# Patient Record
Sex: Female | Born: 1945 | Race: White | Hispanic: No | Smoking: Former smoker
Health system: Southern US, Community
[De-identification: ages and names within clinical notes are randomized; demographics above are authoritative.]

## PROBLEM LIST (undated history)

## (undated) DIAGNOSIS — M1990 Unspecified osteoarthritis, unspecified site: Secondary | ICD-10-CM

## (undated) DIAGNOSIS — R519 Headache, unspecified: Secondary | ICD-10-CM

## (undated) DIAGNOSIS — R51 Headache: Secondary | ICD-10-CM

## (undated) DIAGNOSIS — K5520 Angiodysplasia of colon without hemorrhage: Secondary | ICD-10-CM

## (undated) DIAGNOSIS — K529 Noninfective gastroenteritis and colitis, unspecified: Secondary | ICD-10-CM

## (undated) DIAGNOSIS — J45909 Unspecified asthma, uncomplicated: Secondary | ICD-10-CM

## (undated) DIAGNOSIS — F329 Major depressive disorder, single episode, unspecified: Secondary | ICD-10-CM

## (undated) DIAGNOSIS — Z86010 Personal history of colonic polyps: Secondary | ICD-10-CM

## (undated) DIAGNOSIS — N189 Chronic kidney disease, unspecified: Secondary | ICD-10-CM

## (undated) DIAGNOSIS — G43909 Migraine, unspecified, not intractable, without status migrainosus: Secondary | ICD-10-CM

## (undated) DIAGNOSIS — I10 Essential (primary) hypertension: Secondary | ICD-10-CM

## (undated) DIAGNOSIS — K219 Gastro-esophageal reflux disease without esophagitis: Secondary | ICD-10-CM

## (undated) DIAGNOSIS — E785 Hyperlipidemia, unspecified: Secondary | ICD-10-CM

## (undated) DIAGNOSIS — J439 Emphysema, unspecified: Secondary | ICD-10-CM

## (undated) DIAGNOSIS — Z9981 Dependence on supplemental oxygen: Secondary | ICD-10-CM

## (undated) DIAGNOSIS — Z8719 Personal history of other diseases of the digestive system: Secondary | ICD-10-CM

## (undated) DIAGNOSIS — F419 Anxiety disorder, unspecified: Secondary | ICD-10-CM

## (undated) DIAGNOSIS — R112 Nausea with vomiting, unspecified: Secondary | ICD-10-CM

## (undated) DIAGNOSIS — M3130 Wegener's granulomatosis without renal involvement: Secondary | ICD-10-CM

## (undated) DIAGNOSIS — N390 Urinary tract infection, site not specified: Secondary | ICD-10-CM

## (undated) DIAGNOSIS — Z9889 Other specified postprocedural states: Secondary | ICD-10-CM

## (undated) DIAGNOSIS — I2510 Atherosclerotic heart disease of native coronary artery without angina pectoris: Secondary | ICD-10-CM

## (undated) DIAGNOSIS — F32A Depression, unspecified: Secondary | ICD-10-CM

## (undated) DIAGNOSIS — M8580 Other specified disorders of bone density and structure, unspecified site: Secondary | ICD-10-CM

## (undated) HISTORY — DX: Angiodysplasia of colon without hemorrhage: K55.20

## (undated) HISTORY — DX: Essential (primary) hypertension: I10

## (undated) HISTORY — DX: Personal history of colonic polyps: Z86.010

## (undated) HISTORY — PX: APPENDECTOMY: SHX54

## (undated) HISTORY — DX: Unspecified asthma, uncomplicated: J45.909

## (undated) HISTORY — PX: DILATION AND CURETTAGE OF UTERUS: SHX78

## (undated) HISTORY — PX: ABDOMINAL HYSTERECTOMY: SHX81

## (undated) HISTORY — DX: Hereditary hemochromatosis: E83.110

## (undated) HISTORY — DX: Emphysema, unspecified: J43.9

## (undated) HISTORY — DX: Depression, unspecified: F32.A

## (undated) HISTORY — DX: Hyperlipidemia, unspecified: E78.5

## (undated) HISTORY — PX: CYSTECTOMY: SUR359

## (undated) HISTORY — DX: Major depressive disorder, single episode, unspecified: F32.9

## (undated) HISTORY — DX: Atherosclerotic heart disease of native coronary artery without angina pectoris: I25.10

## (undated) HISTORY — PX: ESOPHAGOGASTRODUODENOSCOPY: SHX1529

## (undated) HISTORY — DX: Urinary tract infection, site not specified: N39.0

## (undated) HISTORY — DX: Noninfective gastroenteritis and colitis, unspecified: K52.9

## (undated) HISTORY — PX: TENDON RELEASE: SHX230

## (undated) HISTORY — PX: COLONOSCOPY W/ BIOPSIES: SHX1374

## (undated) HISTORY — DX: Gastro-esophageal reflux disease without esophagitis: K21.9

## (undated) HISTORY — PX: LAPAROSCOPIC CHOLECYSTECTOMY: SUR755

---

## 1998-07-24 ENCOUNTER — Encounter: Payer: Self-pay | Admitting: Obstetrics and Gynecology

## 1998-07-30 ENCOUNTER — Inpatient Hospital Stay (HOSPITAL_COMMUNITY): Admission: RE | Admit: 1998-07-30 | Discharge: 1998-08-01 | Payer: Self-pay | Admitting: Obstetrics and Gynecology

## 1999-03-16 ENCOUNTER — Encounter: Payer: Self-pay | Admitting: Orthopedic Surgery

## 1999-03-16 ENCOUNTER — Encounter: Admission: RE | Admit: 1999-03-16 | Discharge: 1999-03-16 | Payer: Self-pay | Admitting: Orthopedic Surgery

## 1999-03-16 ENCOUNTER — Ambulatory Visit (HOSPITAL_BASED_OUTPATIENT_CLINIC_OR_DEPARTMENT_OTHER): Admission: RE | Admit: 1999-03-16 | Discharge: 1999-03-16 | Payer: Self-pay | Admitting: Orthopedic Surgery

## 1999-09-24 ENCOUNTER — Other Ambulatory Visit: Admission: RE | Admit: 1999-09-24 | Discharge: 1999-09-24 | Payer: Self-pay | Admitting: Obstetrics and Gynecology

## 2002-03-29 HISTORY — PX: BLADDER SUSPENSION: SHX72

## 2002-03-29 HISTORY — DX: Hereditary hemochromatosis: E83.110

## 2004-02-14 ENCOUNTER — Other Ambulatory Visit: Admission: RE | Admit: 2004-02-14 | Discharge: 2004-02-14 | Payer: Self-pay | Admitting: Obstetrics and Gynecology

## 2008-08-09 ENCOUNTER — Encounter: Admission: RE | Admit: 2008-08-09 | Discharge: 2008-08-09 | Payer: Self-pay | Admitting: Pediatrics

## 2008-08-09 ENCOUNTER — Encounter: Admission: RE | Admit: 2008-08-09 | Discharge: 2008-08-09 | Payer: Self-pay | Admitting: Family Medicine

## 2008-12-09 ENCOUNTER — Encounter: Payer: Self-pay | Admitting: Internal Medicine

## 2009-05-12 ENCOUNTER — Encounter: Payer: Self-pay | Admitting: Internal Medicine

## 2009-05-12 LAB — CONVERTED CEMR LAB
ALT: 29 units/L
Albumin: 4.4 g/dL
BUN: 10 mg/dL
CO2: 25 meq/L
Chloride: 101 meq/L
HCT: 39.3 %
HDL: 37 mg/dL
Hemoglobin: 12.9 g/dL
LDL Cholesterol: 153 mg/dL
MCV: 89.6 fL
Potassium: 3.3 meq/L
RBC: 4.39 M/uL
RDW: 16.9 %
Total Bilirubin: 0.4 mg/dL
Total Protein: 6.9 g/dL

## 2009-06-05 ENCOUNTER — Encounter: Payer: Self-pay | Admitting: Internal Medicine

## 2009-06-05 LAB — CONVERTED CEMR LAB
ALT: 22 units/L
AST: 17 units/L
Albumin: 4.3 g/dL
Alkaline Phosphatase: 85 units/L
BUN: 11 mg/dL
CO2: 24 meq/L
Calcium: 9.3 mg/dL
Chloride: 104 meq/L
Creatinine, Ser: 0.75 mg/dL
Glucose, Bld: 102 mg/dL
HCT: 40.2 %
Hemoglobin: 13.3 g/dL
MCV: 90.9 fL
Platelets: 269 10*3/uL
Potassium: 3.7 meq/L
RBC: 4.42 M/uL
RDW: 17.3 %
Sodium: 138 meq/L
Total Bilirubin: 0.3 mg/dL
Total Protein: 6.9 g/dL
WBC: 9.7 10*3/uL

## 2009-07-03 ENCOUNTER — Encounter: Payer: Self-pay | Admitting: Internal Medicine

## 2009-08-18 ENCOUNTER — Encounter: Admission: RE | Admit: 2009-08-18 | Discharge: 2009-08-18 | Payer: Self-pay | Admitting: Family Medicine

## 2009-11-11 ENCOUNTER — Encounter: Payer: Self-pay | Admitting: Internal Medicine

## 2009-11-11 LAB — CONVERTED CEMR LAB
AST: 18 units/L
Alkaline Phosphatase: 85 units/L
CO2: 25 meq/L
Chloride: 103 meq/L
Cholesterol: 201 mg/dL
HDL: 34 mg/dL
LDL Cholesterol: 103 mg/dL
Lymphocytes, automated: 19.3 %
Monocytes Relative: 7.7 %
Neutrophils Relative %: 72 %
RBC: 4.44 M/uL
RDW: 16.9 %
Sodium: 142 meq/L
Triglyceride fasting, serum: 318 mg/dL
WBC: 8.4 10*3/uL

## 2010-01-01 ENCOUNTER — Encounter: Payer: Self-pay | Admitting: Internal Medicine

## 2010-01-01 LAB — CONVERTED CEMR LAB
Calcium: 9.7 mg/dL
Creatinine, Ser: 0.81 mg/dL
Glucose, Bld: 101 mg/dL
Potassium: 3.3 meq/L
Sodium: 143 meq/L

## 2010-01-28 ENCOUNTER — Encounter: Payer: Self-pay | Admitting: Internal Medicine

## 2010-01-28 LAB — CONVERTED CEMR LAB
BUN: 14 mg/dL
Glucose, Bld: 90 mg/dL
Potassium: 3.6 meq/L
Sodium: 141 meq/L

## 2010-04-10 ENCOUNTER — Encounter: Payer: Self-pay | Admitting: Internal Medicine

## 2010-04-19 ENCOUNTER — Encounter: Payer: Self-pay | Admitting: Family Medicine

## 2010-05-15 ENCOUNTER — Ambulatory Visit (INDEPENDENT_AMBULATORY_CARE_PROVIDER_SITE_OTHER): Payer: Self-pay | Admitting: Internal Medicine

## 2010-05-15 ENCOUNTER — Encounter: Payer: Self-pay | Admitting: Internal Medicine

## 2010-05-15 ENCOUNTER — Other Ambulatory Visit: Payer: Self-pay

## 2010-05-15 ENCOUNTER — Other Ambulatory Visit: Payer: Self-pay | Admitting: Internal Medicine

## 2010-05-15 DIAGNOSIS — K219 Gastro-esophageal reflux disease without esophagitis: Secondary | ICD-10-CM

## 2010-05-15 DIAGNOSIS — J309 Allergic rhinitis, unspecified: Secondary | ICD-10-CM

## 2010-05-15 DIAGNOSIS — I10 Essential (primary) hypertension: Secondary | ICD-10-CM

## 2010-05-15 LAB — CONVERTED CEMR LAB
Lymphocytes Relative: 31 % (ref 12–46)
Lymphs Abs: 2.6 10*3/uL (ref 0.7–4.0)
Neutrophils Relative %: 58 % (ref 43–77)
Platelets: 307 10*3/uL (ref 150–400)
WBC: 8.2 10*3/uL (ref 4.0–10.5)

## 2010-05-15 LAB — IBC PANEL
Iron: 43 ug/dL (ref 42–145)
Saturation Ratios: 14.8 % — ABNORMAL LOW (ref 20.0–50.0)
Transferrin: 206.9 mg/dL — ABNORMAL LOW (ref 212.0–360.0)

## 2010-05-15 LAB — POTASSIUM: Potassium: 3.5 mEq/L (ref 3.5–5.1)

## 2010-05-15 LAB — FERRITIN: Ferritin: 11.3 ng/mL (ref 10.0–291.0)

## 2010-05-15 LAB — HEPATIC FUNCTION PANEL: Albumin: 3.9 g/dL (ref 3.5–5.2)

## 2010-05-15 LAB — CREATININE, SERUM: Creatinine, Ser: 0.7 mg/dL (ref 0.4–1.2)

## 2010-05-17 DIAGNOSIS — J309 Allergic rhinitis, unspecified: Secondary | ICD-10-CM | POA: Insufficient documentation

## 2010-05-21 ENCOUNTER — Encounter: Payer: Self-pay | Admitting: Internal Medicine

## 2010-05-21 DIAGNOSIS — J45909 Unspecified asthma, uncomplicated: Secondary | ICD-10-CM | POA: Insufficient documentation

## 2010-05-26 NOTE — Assessment & Plan Note (Signed)
Summary: NEW SELF PAY $184/#/STC   Vital Signs:  Patient profile:   65 year old female Menstrual status:  hysterectomy Height:      65 inches (165.10 cm) Weight:      153 pounds (69.55 kg) BMI:     25.55 O2 Sat:      96 % on Room air Temp:     97.9 degrees F (36.61 degrees C) oral Pulse rate:   97 / minute BP sitting:   124 / 84  (left arm) Cuff size:   large  Vitals Entered By: Brenton Grills CMA Duncan Dull) (May 15, 2010 2:05 PM)  O2 Flow:  Room air CC: New Pt to establish care/ja Is Patient Diabetic? No     Menstrual Status hysterectomy   Primary Care Provider:  Newt Lukes, MD  CC:  New Pt to establish care/ja.  History of Present Illness: new pt to me and our practice, here to est care  hemachromatosis - controls same with vol blood donation every 3 months - no hx liver problems or abn labs per pt (will send for records)  HTN - reports compliance with ongoing medical treatment and no changes in medication dose or frequency. denies adverse side effects related to current therapy.   GERD - reports compliance with ongoing medical treatment and no changes in medication dose or frequency. denies adverse side effects related to current therapy.   allg rhinitis and seasonal sinusitus - controls same with otc meds and neti pot use - no current sinus pressure, fever or purulent discharge, no HA or fever  Preventive Screening-Counseling & Management  Alcohol-Tobacco     Alcohol drinks/day: 0     Smoking Status: current     Smoking Cessation Counseling: yes     Tobacco Counseling: to quit use of tobacco products  Caffeine-Diet-Exercise     Does Patient Exercise: no     Exercise Counseling: to improve exercise regimen     Depression Counseling: not indicated; screening negative for depression  Safety-Violence-Falls     Seat Belt Counseling: not indicated; patient wears seat belts     Helmet Counseling: not applicable     Firearm Counseling: not indicated; uses  recommended firearm safety measures     Violence Counseling: not indicated; no violence risk noted  Clinical Review Panels:  Prevention   Last Mammogram:  normal (03/29/2009)  Immunizations   Last Pneumovax:  Historical (03/29/2009)   Current Medications (verified): 1)  Aspirin 81 Mg Tbec (Aspirin) .Marland Kitchen.. 1 Tablet By Mouth Once Daily 2)  Fish Oil 1000 Mg Caps (Omega-3 Fatty Acids) .Marland Kitchen.. 1 Capsule By Mouth Two Times A Day 3)  Flax Seed Oil 1000 Mg Caps (Flaxseed (Linseed)) .Marland Kitchen.. 1 Capsule By Mouth Two Times A Day 4)  Ranitidine Hcl 150 Mg Caps (Ranitidine Hcl) .Marland Kitchen.. 1 By Mouth Once Daily 5)  Diazepam 5 Mg Tabs (Diazepam) .... 1/2-1 Tablet By Mouth Every 12 Hours As Needed 6)  Hydrochlorothiazide 25 Mg Tabs (Hydrochlorothiazide) .... 1/2 Tablet By Mouth Once Daily 7)  Potassium Chloride 20 Meq/36ml (10%) Liqd (Potassium Chloride) .Marland KitchenMarland KitchenMarland Kitchen 15ml Once Daily  Allergies (verified): 1)  ! Cipro (Ciprofloxacin Hcl)  Past History:  Past Medical History: hemachromatosis - dx 2004 hypertenion essential tremor GERD with HH allg rhinitis  MD roster: gyn - alan ross  Past Surgical History: Cholecystectomy-2011 Appendectomy-1983 Hysterectomy-1983 bladder tack -  2004  Family History: Family History of Alcoholism/Addiction Family History of Arthritis (Parents) Family History Breast cancer 1st degree relative <50 Family  History Hypertension Family History Diabetes 1st degree relative Family History of Heart Disease (Parents)  Social History: Retired - worked in [employer] Married, lives with spouse who has met prostate ca smokes - no alcohol Smoking Status:  current Does Patient Exercise:  no  Review of Systems       see HPI above. I have reviewed all other systems and they were negative.   Physical Exam  General:  alert, well-developed, well-nourished, and cooperative to examination.    Head:  Normocephalic and atraumatic without obvious abnormalities. No apparent alopecia or  balding. Eyes:  vision grossly intact; pupils equal, round and reactive to light.  conjunctiva and lids normal.    Ears:  R ear normal and L ear normal.   Mouth:  teeth and gums in good repair; mucous membranes moist, without lesions or ulcers. oropharynx clear without exudate, no erythema.  Neck:  supple, full ROM, no masses, no thyromegaly; no thyroid nodules or tenderness. no JVD or carotid bruits.   Lungs:  normal respiratory effort, no intercostal retractions or use of accessory muscles; normal breath sounds bilaterally - no crackles and no wheezes.    Heart:  normal rate, regular rhythm, no murmur, and no rub. BLE without edema. Abdomen:  soft, non-tender, normal bowel sounds, no distention; no masses and no appreciable hepatomegaly or splenomegaly.   Genitalia:  defer  Msk:  No deformity or scoliosis noted of thoracic or lumbar spine.   Neurologic:  benign head/neck tremor, alert & oriented X3 and cranial nerves II-XII symetrically intact.  strength normal in all extremities, sensation intact to light touch, and gait normal. speech fluent without dysarthria or aphasia; follows commands with good comprehension.  Skin:  no rashes, vesicles, ulcers, or erythema. No nodules or irregularity to palpation.  Psych:  Oriented X3, memory intact for recent and remote, normally interactive, good eye contact, not anxious appearing, not depressed appearing, and not agitated.      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 Laboratory test [phone])  Problem # 2:  HYPERTENSION (ICD-401.9)  Her updated medication list for this problem includes:    Hydrochlorothiazide 25 Mg Tabs  (Hydrochlorothiazide) .Marland Kitchen... 1/2 tablet by mouth once daily  Orders: TLB-Creatinine, Blood (82565-CREA) TLB-Potassium (K+) (84132-K)  BP today: 124/84  Problem # 3:  GERD (ICD-530.81)  Her updated medication list for this problem includes:    Ranitidine Hcl 150 Mg Caps (Ranitidine hcl) .Marland Kitchen... 1 by mouth once daily  Problem # 4:  ALLERGIC RHINITIS (ICD-477.9)  Discussed use of allergy medications and environmental measures.   Complete Medication List: 1)  Aspirin 81 Mg Tbec (Aspirin) .Marland Kitchen.. 1 tablet by mouth once daily 2)  Fish Oil 1000 Mg Caps (Omega-3 fatty acids) .Marland Kitchen.. 1 capsule by mouth two times a day 3)  Flax Seed Oil 1000 Mg Caps (Flaxseed (linseed)) .Marland Kitchen.. 1 capsule by mouth two times a day 4)  Ranitidine Hcl 150 Mg Caps (Ranitidine hcl) .Marland Kitchen.. 1 by mouth once daily 5)  Diazepam 5 Mg Tabs (Diazepam) .... 1/2-1 tablet by mouth every 12 hours as needed 6)  Hydrochlorothiazide 25 Mg Tabs (Hydrochlorothiazide) .... 1/2 tablet by mouth once daily 7)  Klor-con M10 10 Meq Cr-tabs (Potassium chloride crys cr) .Marland Kitchen.. 1 by mouth  once daily 8)  Methocarbamol 500 Mg Tabs (Methocarbamol) .Marland Kitchen.. 1 by mouth every 8 hours as needed for muscle relaxant  Patient Instructions: 1)  it was good to see you today. 2)  test(s) ordered today - your results will be posted on the phone tree for review in 48-72 hours from the time of test completion; call [phone] and enter your 9 digit MRN (listed above on this page, just below your name); if any changes need to be made or there are abnormal results, you will be contacted directly.  3)  change potassium to pills instead of liquid and use generic robaxin for muscle relaxant as needed - your prescriptions have been electronically submitted to your pharmacy. Please take as directed. Contact our office if you believe you're having problems with the medication(s).  4)  will send to your prior doctors for copy of medical records - release of info signed today 5)   Please schedule a follow-up appointment in 6 months to check cholesterol, blood pressure and review medications, call sooner if problems.  6)  Tobacco is very bad for your health and your loved ones! You Should stop smoking!. Prescriptions: METHOCARBAMOL 500 MG TABS (METHOCARBAMOL) 1 by mouth every 8 hours as needed for muscle relaxant  #40 x 1   Entered and Authorized by:   Newt Lukes MD   Signed by:   Newt Lukes MD on 05/15/2010   Method used:   Electronically to        [facility] [address] (retail)       [address], [address]       [place], [place]  [address]       Ph: [phone]       Fax: [phone]   RxID:   [id number] KLOR-CON M10 10 MEQ CR-TABS (POTASSIUM CHLORIDE CRYS CR) 1 by mouth once daily  #30 x 6   Entered and Authorized by:   Newt Lukes MD   Signed by:   Newt Lukes MD on 05/15/2010   Method used:   Electronically to        [facility] [address] (retail)       [address], [address]       [place], [place]  [address]       Ph: [phone]       Fax: [phone]   RxID:   [id number]    Orders Added: 1)  TLB-Creatinine, Blood [82565-CREA] 2)  TLB-Potassium (K+) [84132-K] 3)  TLB-Ferritin [82728-FER] 4)  TLB-Hepatic/Liver Function Pnl [80076-HEPATIC] 5)  TLB-IBC Pnl (Iron/FE;Transferrin) [83550-IBC] 6)  T- * Misc. Laboratory test [99999] 7)  New Patient Level III [99203]   Immunization History:  Pneumovax Immunization History:    Pneumovax:  historical (03/29/2009)   Immunization History:  Pneumovax Immunization History:    Pneumovax:  Historical (03/29/2009)    Preventive Care Screening  Mammogram:    Date:  03/29/2009    Results:  normal

## 2010-06-09 NOTE — Letter (Signed)
Summary: Medical Hx/[facility]  Medical Hx/[facility]   Imported By: Sherian Rein 06/03/2010 11:07:02  _____________________________________________________________________  External Attachment:    Type:   Image     Comment:   External Document

## 2010-06-09 NOTE — Letter (Signed)
Summary: Lars Mage DO  Lars Mage DO   Imported By: Sherian Rein 06/03/2010 11:05:46  _____________________________________________________________________  External Attachment:    Type:   Image     Comment:   External Document

## 2010-06-09 NOTE — Letter (Signed)
Summary: H&P/[facility]  H&P/[facility]   Imported By: Sherian Rein 06/03/2010 11:02:37  _____________________________________________________________________  External Attachment:    Type:   Image     Comment:   External Document

## 2010-11-05 ENCOUNTER — Encounter: Payer: Self-pay | Admitting: Internal Medicine

## 2010-11-13 ENCOUNTER — Encounter: Payer: Self-pay | Admitting: Internal Medicine

## 2010-11-13 ENCOUNTER — Ambulatory Visit (INDEPENDENT_AMBULATORY_CARE_PROVIDER_SITE_OTHER): Payer: Medicare Other | Admitting: Internal Medicine

## 2010-11-13 DIAGNOSIS — H6692 Otitis media, unspecified, left ear: Secondary | ICD-10-CM

## 2010-11-13 DIAGNOSIS — F4321 Adjustment disorder with depressed mood: Secondary | ICD-10-CM

## 2010-11-13 DIAGNOSIS — I10 Essential (primary) hypertension: Secondary | ICD-10-CM

## 2010-11-13 DIAGNOSIS — J309 Allergic rhinitis, unspecified: Secondary | ICD-10-CM

## 2010-11-13 DIAGNOSIS — H6690 Otitis media, unspecified, unspecified ear: Secondary | ICD-10-CM

## 2010-11-13 MED ORDER — METHOCARBAMOL 500 MG PO TABS: 500.0000 mg | ORAL_TABLET | Freq: Three times a day (TID) | ORAL | Status: DC | PRN

## 2010-11-13 MED ORDER — POTASSIUM CHLORIDE 10 MEQ PO TBCR: 10.0000 meq | EXTENDED_RELEASE_TABLET | Freq: Every day | ORAL | Status: DC

## 2010-11-13 MED ORDER — FLUTICASONE PROPIONATE 50 MCG/ACT NA SUSP: 2.0000 | Freq: Every day | NASAL | Status: DC

## 2010-11-13 MED ORDER — HYDROCHLOROTHIAZIDE 12.5 MG PO TABS: 12.5000 mg | ORAL_TABLET | Freq: Every day | ORAL | Status: DC

## 2010-11-13 MED ORDER — AMOXICILLIN-POT CLAVULANATE 875-125 MG PO TABS: 1.0000 | ORAL_TABLET | Freq: Two times a day (BID) | ORAL | Status: DC

## 2010-11-13 NOTE — Progress Notes (Signed)
  Subjective:    Patient ID: [name] [name], female    DOB: [birth year], 65 y.o.   MRN: [id number]  HPI complains of grief : loss of spouse since last OV - tearful spells, feels so alone -  Family/dtr nearby - pt denies si/hi or need for medications  also reviewed chronic medical illness  hemachromatosis - controls same with voluntary blood donation every 3 months - no hx liver problems or abn labs   hypertension - reports compliance with ongoing medical treatment and no changes in medication dose or frequency. denies adverse side effects related to current therapy.   GERD - reports compliance with ongoing medical treatment and no changes in medication dose or frequency. denies adverse side effects related to current therapy.   allergic rhinitis - associated with sinusitus - usually controls same with otc meds and neti pot use - recent increase in maxillary sinus pressure and left ear pressure consistent with prior infection symptoms - no fever or purulent discharge, no HA or fever   Past Medical History  Diagnosis Date  . Hereditary hemochromatosis   . ASTHMA   . ALLERGIC RHINITIS   . HYPERTENSION   . GERD     Review of Systems  Constitutional: Positive for fatigue.  HENT: Positive for postnasal drip. Negative for tinnitus.   Respiratory: Negative for cough.   Genitourinary: Negative for dysuria.  Neurological: Negative for numbness and headaches.  Psychiatric/Behavioral: Positive for dysphoric mood. Negative for behavioral problems and confusion.       Objective:   Physical Exam BP 118/76  Pulse 86  Temp(Src) 98.4 F (36.9 C) (Oral)  Ht 5\' 5"  (1.651 m)  Wt 138 lb 9.6 oz (62.869 kg)  BMI 23.06 kg/m2  SpO2 97% Constitutional: She is well-developed and well-nourished. No distress. (but emotional)  HENT: Head: Normocephalic and atraumatic, mildly tender over frontal sinus bilaterally. Ears: L TM with serous effusion, mild erythema; R TM ok, no erythema or effusion; Nose:  Nose normal.  Mouth/Throat: Oropharynx is clear and moist. No oropharyngeal exudate.  Eyes: Conjunctivae and EOM are normal. Pupils are equal, round, and reactive to light. No scleral icterus.  Neck: Normal range of motion. Neck supple. No JVD or LAD present. No thyromegaly present.  Cardiovascular: Normal rate, regular rhythm and normal heart sounds.  No murmur heard. No BLE edema. Pulmonary/Chest: Effort normal and breath sounds normal. No respiratory distress. She has no wheezes.  Psychiatric: She has an appropriately depressed mood and affect, tearful. Her behavior is normal. Judgment and thought content normal.   Lab Results  Component Value Date   WBC 8.2 05/15/2010   HGB 12.5 05/15/2010   HCT 37.3 05/15/2010   PLT 307 05/15/2010   CHOL 201 11/11/2009   HDL 34 11/11/2009   ALT 17 07/05/8117   AST 16 05/15/2010   NA 141 01/28/2010   K 3.5 05/15/2010   CL 106 01/28/2010   CREATININE 0.7 05/15/2010   BUN 14 01/28/2010   CO2 23 01/28/2010   TSH 2.54 12/09/2008   Lab Results  Component Value Date   IRON 43 05/15/2010   FERRITIN 11.3 05/15/2010      Assessment & Plan:  L OM with sinusitus - Augmentin and Flonase - erx done  Grief reaction - support offered over loss of spouse - no need for med intervention at this time and pt declines refer for counseling, will call if symptoms unimproved or worse  Also See problem list. Medications and labs reviewed today.

## 2010-11-13 NOTE — Patient Instructions (Addendum)
It was good to see you today. Augmentin antibiotics and nose spray as discussed - Your prescription(s) have been submitted to your pharmacy. Please take as directed and contact our office if you believe you are having problem(s) with the medication(s). Refill on medication(s) as discussed today. Please schedule followup in 4-6 months for blood pressure and cholesterol check, call sooner if problems. Hang there! Call if you need anything to help you get through this time

## 2010-11-13 NOTE — Assessment & Plan Note (Signed)
Controlled with low dose diuretic - The current medical regimen is effective;  continue present plan and medications.  Pt declines labs today - plan lipids, Cr/lytes next ov BP Readings from Last 3 Encounters:  11/13/10 118/76  05/15/10 124/84

## 2011-01-18 ENCOUNTER — Encounter: Payer: Self-pay | Admitting: Internal Medicine

## 2011-01-18 ENCOUNTER — Other Ambulatory Visit (INDEPENDENT_AMBULATORY_CARE_PROVIDER_SITE_OTHER): Payer: Medicare Other

## 2011-01-18 ENCOUNTER — Ambulatory Visit (INDEPENDENT_AMBULATORY_CARE_PROVIDER_SITE_OTHER): Payer: Medicare Other | Admitting: Internal Medicine

## 2011-01-18 DIAGNOSIS — I10 Essential (primary) hypertension: Secondary | ICD-10-CM

## 2011-01-18 DIAGNOSIS — Z23 Encounter for immunization: Secondary | ICD-10-CM

## 2011-01-18 DIAGNOSIS — F4321 Adjustment disorder with depressed mood: Secondary | ICD-10-CM

## 2011-01-18 LAB — BASIC METABOLIC PANEL
BUN: 12 mg/dL (ref 6–23)
Calcium: 9.1 mg/dL (ref 8.4–10.5)
GFR: 81.14 mL/min (ref >60.00)
Glucose, Bld: 99 mg/dL (ref 70–99)
Sodium: 142 mEq/L (ref 135–145)

## 2011-01-18 LAB — CBC WITH DIFFERENTIAL/PLATELET
Basophils Relative: 0.2 % (ref 0.0–3.0)
Eosinophils Relative: 0.9 % (ref 0.0–5.0)
HCT: 42.3 % (ref 36.0–46.0)
Hemoglobin: 14.6 g/dL (ref 12.0–15.0)
MCV: 96.3 fl (ref 78.0–100.0)
Monocytes Absolute: 0.8 10*3/uL (ref 0.1–1.0)
Neutro Abs: 7.4 10*3/uL (ref 1.4–7.7)
Neutrophils Relative %: 75.5 % (ref 43.0–77.0)
RBC: 4.39 Mil/uL (ref 3.87–5.11)
WBC: 9.8 10*3/uL (ref 4.5–10.5)

## 2011-01-18 LAB — HEPATIC FUNCTION PANEL
ALT: 14 U/L (ref 0–35)
Albumin: 4.1 g/dL (ref 3.5–5.2)
Alkaline Phosphatase: 81 U/L (ref 39–117)
Bilirubin, Direct: 0 mg/dL (ref 0.0–0.3)
Total Protein: 7 g/dL (ref 6.0–8.3)

## 2011-01-18 MED ORDER — NYSTATIN 100000 UNIT/ML MT SUSP: 500000.0000 [IU] | Freq: Four times a day (QID) | OROMUCOSAL | Status: AC

## 2011-01-18 MED ORDER — DIAZEPAM 5 MG PO TABS: 5.0000 mg | ORAL_TABLET | Freq: Two times a day (BID) | ORAL | Status: DC | PRN

## 2011-01-18 NOTE — Patient Instructions (Signed)
It was good to see you today. Test(s) ordered today. Your results will be called to you after review (48-72hours after test completion). If any changes need to be made, you will be notified at that time. Refill on medication(s) as discussed today. And use mouthwash as needed - Your prescription(s) have been submitted to your pharmacy. Please take as directed and contact our office if you believe you are having problem(s) with the medication(s). Please schedule followup in 4-6 months for blood pressure and iron check, call sooner if problems.

## 2011-01-18 NOTE — Progress Notes (Signed)
  Subjective:    Patient ID: [name] [name], female    DOB: 03-Oct-1945, 65 y.o.   MRN: [id number]  HPI  Here for follow up - reviewed chronic medical illness  hemachromatosis - controls same with voluntary blood donation every 3 months - no hx liver problems or abnormal labs   hypertension - reports compliance with ongoing medical treatment and no changes in medication dose or frequency. denies adverse side effects related to current therapy.   GERD - reports compliance with ongoing medical treatment and no changes in medication dose or frequency. denies adverse side effects related to current therapy.   allergic rhinitis - associated with sinusitus - usually controls same with otc meds and neti pot use - recent increase in maxillary sinus pressure and left ear pressure consistent with prior infection symptoms - no fever or purulent discharge, no HA or fever   Past Medical History  Diagnosis Date  . Hereditary hemochromatosis   . ASTHMA   . ALLERGIC RHINITIS   . HYPERTENSION   . GERD     Review of Systems  Constitutional: Positive for fatigue.  HENT: Positive for postnasal drip. Negative for tinnitus.   Respiratory: Negative for cough.   Genitourinary: Negative for dysuria.  Neurological: Negative for numbness and headaches.  Psychiatric/Behavioral: Positive for dysphoric mood. Negative for behavioral problems and confusion.       Objective:   Physical Exam  BP 120/86  Pulse 98  Temp(Src) 97.6 F (36.4 C) (Oral)  Ht 5\' 4"  (1.626 m)  Wt 137 lb 6.4 oz (62.324 kg)  BMI 23.58 kg/m2  SpO2 97% Wt Readings from Last 3 Encounters:  01/18/11 137 lb 6.4 oz (62.324 kg)  11/13/10 138 lb 9.6 oz (62.869 kg)  05/15/10 153 lb (69.4 kg)    Constitutional: She is well-developed and well-nourished. No distress. Mild resting head/neck tremor Mouth/Throat: Oropharynx is clear and moist. No oropharyngeal exudate.  Eyes: Conjunctivae and EOM are normal. Pupils are equal, round, and  reactive to light. No scleral icterus.  Neck: Normal range of motion. Neck supple. No JVD or LAD present. No thyromegaly present.  Cardiovascular: Normal rate, regular rhythm and normal heart sounds.  No murmur heard. No BLE edema. Pulmonary/Chest: Effort normal and breath sounds normal. No respiratory distress. She has no wheezes.  Psychiatric: She has an appropriately depressed mood and affect, tearful. Her behavior is normal. Judgment and thought content normal.   Lab Results  Component Value Date   WBC 8.2 05/15/2010   HGB 12.5 05/15/2010   HCT 37.3 05/15/2010   PLT 307 05/15/2010   CHOL 201 11/11/2009   HDL 34 11/11/2009   ALT 17 11/25/5619   AST 16 05/15/2010   NA 141 01/28/2010   K 3.5 05/15/2010   CL 106 01/28/2010   CREATININE 0.7 05/15/2010   BUN 14 01/28/2010   CO2 23 01/28/2010   TSH 2.54 12/09/2008   Lab Results  Component Value Date   IRON 43 05/15/2010   FERRITIN 11.3 05/15/2010      Assessment & Plan:  See problem list. Medications and labs reviewed today.

## 2011-01-18 NOTE — Assessment & Plan Note (Signed)
Precipitated by spouses death summer 08/2010 - tolerating well considering Support offered - refill on valium as requested to use prn

## 2011-01-18 NOTE — Assessment & Plan Note (Signed)
Controlled with low dose diuretic - The current medical regimen is effective;  continue present plan and medications.   BP Readings from Last 3 Encounters:  01/18/11 120/86  11/13/10 118/76  05/15/10 124/84

## 2011-01-18 NOTE — Assessment & Plan Note (Signed)
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

## 2011-02-05 ENCOUNTER — Other Ambulatory Visit (HOSPITAL_COMMUNITY): Payer: Self-pay | Admitting: Obstetrics and Gynecology

## 2011-02-05 DIAGNOSIS — R1032 Left lower quadrant pain: Secondary | ICD-10-CM

## 2011-02-08 DIAGNOSIS — N838 Other noninflammatory disorders of ovary, fallopian tube and broad ligament: Secondary | ICD-10-CM | POA: Insufficient documentation

## 2011-02-10 ENCOUNTER — Ambulatory Visit (HOSPITAL_COMMUNITY)
Admission: RE | Admit: 2011-02-10 | Discharge: 2011-02-10 | Disposition: A | Discharge status: Home / Self Care | Payer: Medicare Other | Source: Ambulatory Visit | Attending: Obstetrics and Gynecology | Admitting: Obstetrics and Gynecology

## 2011-02-10 DIAGNOSIS — Z90710 Acquired absence of both cervix and uterus: Secondary | ICD-10-CM | POA: Insufficient documentation

## 2011-02-10 DIAGNOSIS — R197 Diarrhea, unspecified: Secondary | ICD-10-CM | POA: Insufficient documentation

## 2011-02-10 DIAGNOSIS — R1032 Left lower quadrant pain: Secondary | ICD-10-CM | POA: Insufficient documentation

## 2011-02-10 DIAGNOSIS — D259 Leiomyoma of uterus, unspecified: Secondary | ICD-10-CM | POA: Insufficient documentation

## 2011-02-10 DIAGNOSIS — N9489 Other specified conditions associated with female genital organs and menstrual cycle: Secondary | ICD-10-CM | POA: Insufficient documentation

## 2011-02-10 LAB — BUN: BUN: 12 mg/dL (ref 6–23)

## 2011-02-10 MED ORDER — IOHEXOL 300 MG/ML  SOLN
100.0000 mL | Freq: Once | INTRAMUSCULAR | Status: AC | PRN
  Administered 2011-02-10: 100 mL via INTRAVENOUS

## 2011-03-03 ENCOUNTER — Ambulatory Visit (INDEPENDENT_AMBULATORY_CARE_PROVIDER_SITE_OTHER): Payer: Medicare Other | Admitting: Internal Medicine

## 2011-03-03 ENCOUNTER — Encounter: Payer: Self-pay | Admitting: Internal Medicine

## 2011-03-03 ENCOUNTER — Ambulatory Visit (INDEPENDENT_AMBULATORY_CARE_PROVIDER_SITE_OTHER)
Admission: RE | Admit: 2011-03-03 | Discharge: 2011-03-03 | Disposition: A | Discharge status: Home / Self Care | Payer: Medicare Other | Source: Ambulatory Visit | Attending: Internal Medicine | Admitting: Internal Medicine

## 2011-03-03 VITALS — BP 112/68 | HR 95 | Temp 98.2°F

## 2011-03-03 DIAGNOSIS — R05 Cough: Secondary | ICD-10-CM

## 2011-03-03 DIAGNOSIS — N838 Other noninflammatory disorders of ovary, fallopian tube and broad ligament: Secondary | ICD-10-CM

## 2011-03-03 DIAGNOSIS — J209 Acute bronchitis, unspecified: Secondary | ICD-10-CM

## 2011-03-03 DIAGNOSIS — R059 Cough, unspecified: Secondary | ICD-10-CM

## 2011-03-03 DIAGNOSIS — N839 Noninflammatory disorder of ovary, fallopian tube and broad ligament, unspecified: Secondary | ICD-10-CM

## 2011-03-03 MED ORDER — BENZONATATE 100 MG PO CAPS: 100.0000 mg | ORAL_CAPSULE | Freq: Three times a day (TID) | ORAL | Status: DC | PRN

## 2011-03-03 MED ORDER — METHYLPREDNISOLONE ACETATE 80 MG/ML IJ SUSP
120.0000 mg | Freq: Once | INTRAMUSCULAR | Status: AC
  Administered 2011-03-03: 120 mg via INTRAMUSCULAR

## 2011-03-03 NOTE — Progress Notes (Signed)
  Subjective:    HPI  complains of dry cough and cold symptoms  Onset 2 week ago, wax/wane symptoms  Not associated with rhinorrhea, sneezing, sore throat, headache or fever associated with myalgias, mild sinus pressure and mod chest congestion No relief with OTC meds Precipitated by sick contacts  Past Medical History  Diagnosis Date  . Hereditary hemochromatosis   . ASTHMA   . ALLERGIC RHINITIS   . HYPERTENSION   . GERD     Review of Systems Constitutional: No fever or night sweats, no unexpected weight change Pulmonary: No pleurisy or hemoptysis Cardiovascular: No chest pain or palpitations     Objective:   Physical Exam BP 112/68  Pulse 95  Temp(Src) 98.2 F (36.8 C) (Oral)  SpO2 97% GEN: mildly ill appearing and audible chest congestion HENT: NCAT, no sinus tenderness bilaterally, nares with clear discharge, oropharynx mod erythema, no exudate Eyes: Vision grossly intact, no conjunctivitis Lungs: Few rhonchi with end exp wheeze, no increased work of breathing Cardiovascular: Regular rate and rhythm, no bilateral edema      Assessment & Plan:   Cough, post viral bronchitis (smoker) Bronchitis with brochospasm  Explained lack of efficacy for antibiotics in viral disease Check cxr  Prescription cough suppression - new prescriptions done Symptomatic care with Tylenol or Advil, hydration and rest -  salt gargle advised as needed

## 2011-03-03 NOTE — Assessment & Plan Note (Signed)
Noted on CT by gyn during eval for LLQ pain > to see gyn onc next week Support offered today

## 2011-03-03 NOTE — Patient Instructions (Signed)
It was good to see you today. Steroid shot given today for your bronchitis inflammation Tessalon Perles for cough symptoms as needed - Your prescription(s) have been submitted to your pharmacy. Please take as directed and contact our office if you believe you are having problem(s) with the medication(s). If you develop worsening symptoms or fever, call and we can reconsider antibiotics, but it does not appear necessary to use antibiotics at this time. Chest x-ray done today. We will call you with these results after review Good luck with your appointment next week and possible surgery. Call us if you need [place] for anything

## 2011-03-10 ENCOUNTER — Ambulatory Visit: Payer: Medicare Other | Attending: Gynecologic Oncology | Admitting: Gynecologic Oncology

## 2011-03-10 ENCOUNTER — Encounter: Payer: Self-pay | Admitting: Gynecologic Oncology

## 2011-03-10 VITALS — BP 120/74 | HR 82 | Temp 98.8°F | Resp 16 | Ht 62.8 in | Wt 139.3 lb

## 2011-03-10 DIAGNOSIS — F17200 Nicotine dependence, unspecified, uncomplicated: Secondary | ICD-10-CM | POA: Insufficient documentation

## 2011-03-10 DIAGNOSIS — J45909 Unspecified asthma, uncomplicated: Secondary | ICD-10-CM | POA: Insufficient documentation

## 2011-03-10 DIAGNOSIS — K219 Gastro-esophageal reflux disease without esophagitis: Secondary | ICD-10-CM | POA: Insufficient documentation

## 2011-03-10 DIAGNOSIS — Z7982 Long term (current) use of aspirin: Secondary | ICD-10-CM | POA: Insufficient documentation

## 2011-03-10 DIAGNOSIS — I10 Essential (primary) hypertension: Secondary | ICD-10-CM | POA: Insufficient documentation

## 2011-03-10 DIAGNOSIS — Z90710 Acquired absence of both cervix and uterus: Secondary | ICD-10-CM | POA: Insufficient documentation

## 2011-03-10 DIAGNOSIS — R971 Elevated cancer antigen 125 [CA 125]: Secondary | ICD-10-CM | POA: Insufficient documentation

## 2011-03-10 DIAGNOSIS — Z79899 Other long term (current) drug therapy: Secondary | ICD-10-CM | POA: Insufficient documentation

## 2011-03-10 DIAGNOSIS — N9489 Other specified conditions associated with female genital organs and menstrual cycle: Secondary | ICD-10-CM | POA: Insufficient documentation

## 2011-03-10 DIAGNOSIS — Z803 Family history of malignant neoplasm of breast: Secondary | ICD-10-CM | POA: Insufficient documentation

## 2011-03-10 DIAGNOSIS — N838 Other noninflammatory disorders of ovary, fallopian tube and broad ligament: Secondary | ICD-10-CM

## 2011-03-10 NOTE — Progress Notes (Signed)
Consult Note: Gyn-Onc  Herbie Baltimore 65 y.o. female  CC:  Chief Complaint  Patient presents with  . Gynecologic Exam    New pt, adnexal mass, elevated ca 125    HPI: Patient is seen today in consultation at the request of Dr. Tenny Craw. Ms. 52 is a 65 year old gravida 2 para 2 who has a one year history of some left-sided pain and discomfort. She has a history of giving broad at the [employer] as "iron level is too high". The last and she gave blood pressure platelet count was high. Therefore, in the setting of a high platelet count and this pain shortness he Dr. Tenny Craw is within the first was diagnosed with leukemia when her platelet count was very high. Apparently a mass was appreciated on exam. A CT scan of the abdomen and pelvis was ordered on November 14. A prior hysterectomy. Homogeneous solid appearing mass in the left adnexa which measured 4.6 x 7.5 cm. There is no evidence of internal calcification. There is no pelvic masses or lymphadenopathy identified. There is no ascites. CEA was elevated at 5.1 normal range is less than 5 in smokers. Her CA 125 was 8.   Interval History:   Review of Systems: She has had 2 episodes of nausea this week but no emesis. The pain is no longer there. It has always been intermittent. It does not wake her up at night. He does not require pain medications. She denies any change in her bowel or bladder habits. She denies any vaginal or rectal bleeding. Transient nausea other than as mentioned above no vomiting. Denies any fevers or chills. She states that she had a "6 weeks ago she was seen by Dr. Felicity Coyer and was given a steroid shot as well as some medication for a cold. She states her chest x-ray was negative. She denies any early satiety. She has lost approximately 30 pounds this year. Her husband passed away in 11/03/22 of this year from prostate cancer.  Current Meds:  Outpatient Encounter Prescriptions as of 03/10/2011  Medication Sig Dispense Refill  . aspirin  81 MG tablet Take 81 mg by mouth daily.        . benzonatate (TESSALON PERLES) 100 MG capsule Take 1 capsule (100 mg total) by mouth 3 (three) times daily as needed for cough.  30 capsule  0  . diazepam (VALIUM) 5 MG tablet Take 1 tablet (5 mg total) by mouth every 12 (twelve) hours as needed for anxiety.  60 tablet  1  . hydrochlorothiazide (HYDRODIURIL) 12.5 MG tablet Take 1 tablet (12.5 mg total) by mouth daily.  30 tablet  5  . methocarbamol (ROBAXIN) 500 MG tablet Take 1 tablet (500 mg total) by mouth every 8 (eight) hours as needed.  30 tablet  2  . potassium chloride (KLOR-CON) 10 MEQ CR tablet Take 1 tablet (10 mEq total) by mouth daily.  30 tablet  5  . ranitidine (ZANTAC) 150 MG capsule Take 150 mg by mouth daily.        . fluticasone (FLONASE) 50 MCG/ACT nasal spray Place 2 sprays into the nose daily.  16 g  2    Allergy:  Allergies  Allergen Reactions  . Ciprofloxacin Hives    Blisters on legs  . Codeine Nausea And Vomiting    Sweating, "passes out"  . Morphine And Related     "hospital bed was shaking"    Social Hx:   History   Social History  . Marital  Status: Widowed    Spouse Name: N/A    Number of Children: N/A  . Years of Education: N/A   Occupational History  . Not on file.   Social History Main Topics  . Smoking status: Current Everyday Smoker -- 0.5 packs/day for 50 years    Types: Cigarettes  . Smokeless tobacco: Not on file   Comment: widowed summer 2012, lives alone. Retired from work in [employer]  . Alcohol Use: No  . Drug Use: No  . Sexually Active: No   Other Topics Concern  . Not on file   Social History Narrative  . No narrative on file    Past Surgical Hx:  Past Surgical History  Procedure Date  . Cholecystectomy 03/29/09  . Appendectomy 03/29/81  . Abdominal hysterectomy 03/29/81  . Bladder tack 03/29/2002    Past Medical Hx:  Past Medical History  Diagnosis Date  . Hereditary hemochromatosis   . ASTHMA   . ALLERGIC RHINITIS     . HYPERTENSION   . GERD     Family Hx: She has 2 brothers who died of lung cancer they were both smokers. Family History  Problem Relation Age of Onset  . Breast cancer Mother   . Hypertension Mother   . Heart disease Mother   . Diabetes Father   . Heart disease Father   . Alcohol abuse Other   . Arthritis Other     Vitals:  Blood pressure 120/74, pulse 82, temperature 98.8 F (37.1 C), temperature source Oral, resp. rate 16, height 5' 2.8" (1.595 m), weight 139 lb 4.8 oz (63.186 kg).  Physical Exam: Well-nourished well-developed female in no acute distress.  Neck: Supple no lymphadenopathy no thyromegaly.  Cardiovascular: Regular rate and rhythm.  Abdomen: Well-healed surgical incisions. There is no fluid wave. Abdomen is soft nontender and nondistended there are no palpable masses or hepatosplenomegaly.  Groins: No lymphadenopathy.  Extremities: No edema.  Pelvic: Normal external female genitalia. There is a solid left adnexal mass felt at the left apex of the vagina. It has the feel of a fibroma. Rectovaginal examination reveals no nodularity.  Assessment/Plan: 65 year old referred to [place] by Dr. Tenny Craw was solid a 7 cm left adnexal mass. This is notable in the setting of a normal CEA 125 and a slightly elevated CEA. The patient has never had a colonoscopy but really denies any bowel symptoms. She is a smoker. I discussed with her and her daughter's the role of surgery. 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 However, due to her pain in the solid nature in a postmenopausal woman I would advocate removal. She is in agreement with this. I believe we can do this laparoscopically. He discussed performing a laparoscopic left salpingo-oophorectomy. Second mass for frozen section and determining the need for lymphadenectomy based on frozen section.  Because of the pulmonary issue she talks about today sound like there  might be due to concerns of bronchitis. We'll defer her surgery for approximately 2-3 weeks to allow her to improve from this perspective. The most usual risks and benefits of surgery including but not limited to bleeding infection injury surrounding organs need for laparotomy were discussed the patient  Discussed the risk of embolic disease. I encourage her to pursue some smoking cessation or smoking decrease prior to surgery. Their questions were elicited in answer to to her satisfaction. She has my card and knows that I happy to  speak with her if she has any questions prior to the surgery date.  Cleda Mccreedy A., MD 03/10/2011, 12:39 PM

## 2011-03-10 NOTE — Patient Instructions (Signed)
Follow up for preop visit

## 2011-03-11 ENCOUNTER — Telehealth: Payer: Self-pay | Admitting: *Deleted

## 2011-03-11 MED ORDER — PREDNISONE (PAK) 10 MG PO TABS: 10.0000 mg | ORAL_TABLET | ORAL | Status: DC

## 2011-03-11 MED ORDER — DOXYCYCLINE HYCLATE 100 MG PO TABS: 100.0000 mg | ORAL_TABLET | Freq: Two times a day (BID) | ORAL | Status: DC

## 2011-03-11 MED ORDER — PROMETHAZINE-PHENYLEPHRINE 6.25-5 MG/5ML PO SYRP: 5.0000 mL | ORAL_SOLUTION | ORAL | Status: DC | PRN

## 2011-03-11 NOTE — Telephone Encounter (Signed)
Doxy antibiotics twice a day times one week and prednisone pack x6 days - erx done. Also cough syrup to use as needed (no codeine or narcotics in syrup).

## 2011-03-11 NOTE — Telephone Encounter (Signed)
Notified pt with md response. Rx's already sent to pharmacy...03/11/11@1 :56pm/LMB

## 2011-03-11 NOTE — Telephone Encounter (Signed)
Pt states saw md last week for cough. Was told to call back if not better. Pt states she still have cough. Have some chest congestion, but not able to cough anything up. Denies fever, but she did states at time chest feel tight and hard to breathe at times. Suppose to have surgery but saw md yesterday and they told her she could not have surgery until cough has clear up...03/11/11@9 :16am/LMB

## 2011-03-18 ENCOUNTER — Encounter (HOSPITAL_COMMUNITY): Payer: Self-pay | Admitting: Pharmacy Technician

## 2011-04-02 ENCOUNTER — Ambulatory Visit (HOSPITAL_COMMUNITY)
Admission: RE | Admit: 2011-04-02 | Discharge: 2011-04-02 | Disposition: A | Discharge status: Home / Self Care | Payer: Medicare Other | Source: Ambulatory Visit | Attending: Gynecologic Oncology | Admitting: Gynecologic Oncology

## 2011-04-02 ENCOUNTER — Encounter (HOSPITAL_COMMUNITY)
Admission: RE | Admit: 2011-04-02 | Discharge: 2011-04-02 | Disposition: A | Discharge status: Home / Self Care | Payer: Medicare Other | Source: Ambulatory Visit | Attending: Obstetrics & Gynecology | Admitting: Obstetrics & Gynecology

## 2011-04-02 ENCOUNTER — Other Ambulatory Visit: Payer: Self-pay

## 2011-04-02 ENCOUNTER — Encounter (HOSPITAL_COMMUNITY): Payer: Self-pay

## 2011-04-02 DIAGNOSIS — F17200 Nicotine dependence, unspecified, uncomplicated: Secondary | ICD-10-CM | POA: Insufficient documentation

## 2011-04-02 DIAGNOSIS — J45909 Unspecified asthma, uncomplicated: Secondary | ICD-10-CM | POA: Insufficient documentation

## 2011-04-02 DIAGNOSIS — Z01818 Encounter for other preprocedural examination: Secondary | ICD-10-CM | POA: Insufficient documentation

## 2011-04-02 DIAGNOSIS — I10 Essential (primary) hypertension: Secondary | ICD-10-CM | POA: Insufficient documentation

## 2011-04-02 DIAGNOSIS — Z01810 Encounter for preprocedural cardiovascular examination: Secondary | ICD-10-CM | POA: Insufficient documentation

## 2011-04-02 DIAGNOSIS — Z01812 Encounter for preprocedural laboratory examination: Secondary | ICD-10-CM | POA: Insufficient documentation

## 2011-04-02 HISTORY — DX: Chronic kidney disease, unspecified: N18.9

## 2011-04-02 HISTORY — DX: Other specified postprocedural states: Z98.890

## 2011-04-02 HISTORY — DX: Anxiety disorder, unspecified: F41.9

## 2011-04-02 HISTORY — DX: Personal history of other diseases of the digestive system: Z87.19

## 2011-04-02 HISTORY — DX: Other specified postprocedural states: R11.2

## 2011-04-02 HISTORY — DX: Unspecified osteoarthritis, unspecified site: M19.90

## 2011-04-02 LAB — COMPREHENSIVE METABOLIC PANEL
ALT: 13 U/L (ref 0–35)
BUN: 15 mg/dL (ref 6–23)
Calcium: 9.9 mg/dL (ref 8.4–10.5)
Creatinine, Ser: 0.85 mg/dL (ref 0.50–1.10)
GFR calc Af Amer: 82 mL/min — ABNORMAL LOW (ref >90)
Glucose, Bld: 92 mg/dL (ref 70–99)
Sodium: 139 mEq/L (ref 135–145)
Total Protein: 7.3 g/dL (ref 6.0–8.3)

## 2011-04-02 LAB — CBC
Hemoglobin: 14.8 g/dL (ref 12.0–15.0)
MCH: 33.1 pg (ref 26.0–34.0)
MCHC: 34.8 g/dL (ref 30.0–36.0)
MCV: 95.1 fL (ref 78.0–100.0)

## 2011-04-02 LAB — DIFFERENTIAL
Basophils Absolute: 0 10*3/uL (ref 0.0–0.1)
Lymphocytes Relative: 19 % (ref 12–46)
Lymphs Abs: 1.8 10*3/uL (ref 0.7–4.0)
Neutro Abs: 7.1 10*3/uL (ref 1.7–7.7)
Neutrophils Relative %: 73 % (ref 43–77)

## 2011-04-02 NOTE — Pre-Procedure Instructions (Signed)
1/4.13.EKG  04/02/11 CXR repeated due to cough. Last CXR 03/03/11 due to cough.

## 2011-04-02 NOTE — Patient Instructions (Signed)
20 [name] [name]  04/02/2011   Your procedure is scheduled on:  04/06/11 0730am-1000am  Report to [facility] at 0530 AM.  Call this number if you have problems the morning of surgery: [phone]   Remember:   Do not eat food:After Midnight.  May have clear liquids:until Midnight .  Clear liquids include soda, tea, black coffee, apple or grape juice, broth.  Take these medicines the morning of surgery with A SIP OF WATER:    Do not wear jewelry, make-up or nail polish.  Do not wear lotions, powders, or perfumes.   Do not shave 48 hours prior to surgery.  Do not bring valuables to the hospital.  Contacts, dentures or bridgework may not be worn into surgery.  Leave suitcase in the car. After surgery it may be brought to your room.  For patients admitted to the hospital, checkout time is 11:00 AM the day of discharge.     Special Instructions: CHG Shower Use Special Wash: 1/2 bottle night before surgery and 1/2 bottle morning of surgery.   Please read over the following fact sheets that you were given: MRSA Information, coughing and deep breathing exercises, Blood Transfusion Fact sheet, Incentive spirometry fact sheetr

## 2011-04-06 ENCOUNTER — Ambulatory Visit (HOSPITAL_COMMUNITY)
Admission: RE | Admit: 2011-04-06 | Discharge: 2011-04-06 | Disposition: A | Discharge status: Home / Self Care | Payer: Medicare Other | Source: Ambulatory Visit | Attending: Obstetrics & Gynecology | Admitting: Obstetrics & Gynecology

## 2011-04-06 ENCOUNTER — Other Ambulatory Visit: Payer: Self-pay | Admitting: Gynecologic Oncology

## 2011-04-06 ENCOUNTER — Encounter (HOSPITAL_COMMUNITY): Payer: Self-pay | Admitting: Certified Registered Nurse Anesthetist

## 2011-04-06 ENCOUNTER — Encounter (HOSPITAL_COMMUNITY): Payer: Self-pay

## 2011-04-06 ENCOUNTER — Encounter (HOSPITAL_COMMUNITY)
Admission: RE | Disposition: A | Discharge status: Home / Self Care | Payer: Self-pay | Source: Ambulatory Visit | Attending: Obstetrics & Gynecology

## 2011-04-06 ENCOUNTER — Ambulatory Visit (HOSPITAL_COMMUNITY): Payer: Medicare Other | Admitting: Certified Registered Nurse Anesthetist

## 2011-04-06 DIAGNOSIS — Z79899 Other long term (current) drug therapy: Secondary | ICD-10-CM | POA: Insufficient documentation

## 2011-04-06 DIAGNOSIS — R970 Elevated carcinoembryonic antigen [CEA]: Secondary | ICD-10-CM | POA: Insufficient documentation

## 2011-04-06 DIAGNOSIS — Z90710 Acquired absence of both cervix and uterus: Secondary | ICD-10-CM | POA: Insufficient documentation

## 2011-04-06 DIAGNOSIS — I10 Essential (primary) hypertension: Secondary | ICD-10-CM | POA: Insufficient documentation

## 2011-04-06 DIAGNOSIS — Z7982 Long term (current) use of aspirin: Secondary | ICD-10-CM | POA: Insufficient documentation

## 2011-04-06 DIAGNOSIS — F17200 Nicotine dependence, unspecified, uncomplicated: Secondary | ICD-10-CM | POA: Insufficient documentation

## 2011-04-06 DIAGNOSIS — D279 Benign neoplasm of unspecified ovary: Secondary | ICD-10-CM | POA: Insufficient documentation

## 2011-04-06 DIAGNOSIS — N838 Other noninflammatory disorders of ovary, fallopian tube and broad ligament: Secondary | ICD-10-CM

## 2011-04-06 DIAGNOSIS — K219 Gastro-esophageal reflux disease without esophagitis: Secondary | ICD-10-CM | POA: Insufficient documentation

## 2011-04-06 LAB — SAMPLE TO BLOOD BANK

## 2011-04-06 SURGERY — ROBOTIC ASSISTED BILATERAL SALPINGO OOPHORECTOMY
Anesthesia: General | Site: Abdomen | Wound class: Clean

## 2011-04-06 MED ORDER — LACTATED RINGERS IV SOLN
INTRAVENOUS | Status: DC | PRN
  Administered 2011-04-06: 1000 mL

## 2011-04-06 MED ORDER — OXYCODONE-ACETAMINOPHEN 5-325 MG PO TABS: 2.0000 | ORAL_TABLET | Freq: Four times a day (QID) | ORAL | Status: DC | PRN

## 2011-04-06 MED ORDER — PROPOFOL 10 MG/ML IV EMUL
INTRAVENOUS | Status: DC | PRN
  Administered 2011-04-06: 150 mg via INTRAVENOUS

## 2011-04-06 MED ORDER — ROCURONIUM BROMIDE 100 MG/10ML IV SOLN
INTRAVENOUS | Status: DC | PRN
  Administered 2011-04-06: 40 mg via INTRAVENOUS

## 2011-04-06 MED ORDER — DROPERIDOL 2.5 MG/ML IJ SOLN
INTRAMUSCULAR | Status: DC | PRN
  Administered 2011-04-06: 0.625 mg via INTRAVENOUS

## 2011-04-06 MED ORDER — NEOSTIGMINE METHYLSULFATE 1 MG/ML IJ SOLN
INTRAMUSCULAR | Status: DC | PRN
  Administered 2011-04-06: 3 mg via INTRAVENOUS

## 2011-04-06 MED ORDER — MIDAZOLAM HCL 5 MG/5ML IJ SOLN
INTRAMUSCULAR | Status: DC | PRN
  Administered 2011-04-06: 2 mg via INTRAVENOUS

## 2011-04-06 MED ORDER — SCOPOLAMINE 1 MG/3DAYS TD PT72
MEDICATED_PATCH | TRANSDERMAL | Status: DC | PRN
  Administered 2011-04-06: 1 via TRANSDERMAL

## 2011-04-06 MED ORDER — ALBUTEROL SULFATE HFA 108 (90 BASE) MCG/ACT IN AERS
INHALATION_SPRAY | RESPIRATORY_TRACT | Status: DC | PRN
  Administered 2011-04-06: 2 via RESPIRATORY_TRACT

## 2011-04-06 MED ORDER — LACTATED RINGERS IV SOLN
INTRAVENOUS | Status: DC | PRN
  Administered 2011-04-06 (×2): via INTRAVENOUS

## 2011-04-06 MED ORDER — CEFAZOLIN SODIUM 1-5 GM-% IV SOLN
1.0000 g | INTRAVENOUS | Status: AC
  Administered 2011-04-06: 1 g via INTRAVENOUS

## 2011-04-06 MED ORDER — DEXAMETHASONE SODIUM PHOSPHATE 10 MG/ML IJ SOLN
INTRAMUSCULAR | Status: DC | PRN
  Administered 2011-04-06: 10 mg via INTRAVENOUS

## 2011-04-06 MED ORDER — GLYCOPYRROLATE 0.2 MG/ML IJ SOLN
INTRAMUSCULAR | Status: DC | PRN
  Administered 2011-04-06: .4 mg via INTRAVENOUS

## 2011-04-06 MED ORDER — ACETAMINOPHEN 10 MG/ML IV SOLN
INTRAVENOUS | Status: DC | PRN
  Administered 2011-04-06: 1000 mg via INTRAVENOUS

## 2011-04-06 MED ORDER — ONDANSETRON HCL 4 MG/2ML IJ SOLN
INTRAMUSCULAR | Status: DC | PRN
  Administered 2011-04-06: 4 mg via INTRAVENOUS

## 2011-04-06 MED ORDER — LACTATED RINGERS IV SOLN: INTRAVENOUS | Status: DC

## 2011-04-06 MED ORDER — HYDROMORPHONE HCL PF 1 MG/ML IJ SOLN: 0.2500 mg | INTRAMUSCULAR | Status: DC | PRN

## 2011-04-06 MED ORDER — SUCCINYLCHOLINE CHLORIDE 20 MG/ML IJ SOLN
INTRAMUSCULAR | Status: DC | PRN
  Administered 2011-04-06: 100 mg via INTRAVENOUS

## 2011-04-06 MED ORDER — FENTANYL CITRATE 0.05 MG/ML IJ SOLN
INTRAMUSCULAR | Status: DC | PRN
  Administered 2011-04-06 (×2): 50 ug via INTRAVENOUS
  Administered 2011-04-06 (×2): 100 ug via INTRAVENOUS
  Administered 2011-04-06 (×4): 50 ug via INTRAVENOUS

## 2011-04-06 MED ORDER — STERILE WATER FOR IRRIGATION IR SOLN
Status: DC | PRN
  Administered 2011-04-06: 1000 mL

## 2011-04-06 SURGICAL SUPPLY — 45 items
APL SKNCLS STERI-STRIP NONHPOA (GAUZE/BANDAGES/DRESSINGS) ×1
BAG SPEC RTRVL LRG 6X4 10 (ENDOMECHANICALS) ×1
BENZOIN TINCTURE PRP APPL 2/3 (GAUZE/BANDAGES/DRESSINGS) ×2 IMPLANT
CHLORAPREP W/TINT 26ML (MISCELLANEOUS) ×2 IMPLANT
CLOTH BEACON ORANGE TIMEOUT ST (SAFETY) ×2 IMPLANT
CORD HIGH FREQUENCY UNIPOLAR (ELECTROSURGICAL) ×1 IMPLANT
CORDS BIPOLAR (ELECTRODE) ×2 IMPLANT
COVER SURGICAL LIGHT HANDLE (MISCELLANEOUS) ×2 IMPLANT
COVER TIP SHEARS 8 DVNC (MISCELLANEOUS) ×1 IMPLANT
COVER TIP SHEARS 8MM DA VINCI (MISCELLANEOUS) ×1
DECANTER SPIKE VIAL GLASS SM (MISCELLANEOUS) ×1 IMPLANT
DRAPE SURG IRRIG POUCH 19X23 (DRAPES) ×2 IMPLANT
DRAPE UTILITY XL STRL (DRAPES) ×2 IMPLANT
DRSG TEGADERM 6X8 (GAUZE/BANDAGES/DRESSINGS) ×4 IMPLANT
ELECT REM PT RETURN 9FT ADLT (ELECTROSURGICAL) ×2
ELECTRODE REM PT RTRN 9FT ADLT (ELECTROSURGICAL) ×1 IMPLANT
GAUZE VASELINE 3X9 (GAUZE/BANDAGES/DRESSINGS) IMPLANT
GLOVE BIO SURGEON STRL SZ 6.5 (GLOVE) ×8 IMPLANT
GLOVE BIO SURGEON STRL SZ7.5 (GLOVE) ×4 IMPLANT
GLOVE BIOGEL PI IND STRL 7.0 (GLOVE) ×2 IMPLANT
GLOVE BIOGEL PI INDICATOR 7.0 (GLOVE) ×1
GOWN PREVENTION PLUS XLARGE (GOWN DISPOSABLE) ×10 IMPLANT
HOLDER FOLEY CATH W/STRAP (MISCELLANEOUS) ×1 IMPLANT
KIT ACCESSORY DA VINCI DISP (KITS) ×1
KIT ACCESSORY DVNC DISP (KITS) ×1 IMPLANT
MANIPULATOR UTERINE 4.5 ZUMI (MISCELLANEOUS) ×1 IMPLANT
OCCLUDER COLPOPNEUMO (BALLOONS) ×2 IMPLANT
PACK ROBOTIC CUSTOM GYN (CUSTOM PROCEDURE TRAY) ×2 IMPLANT
POUCH SPECIMEN RETRIEVAL 10MM (ENDOMECHANICALS) ×3 IMPLANT
SET TUBE IRRIG SUCTION NO TIP (IRRIGATION / IRRIGATOR) ×2 IMPLANT
SOLUTION ELECTROLUBE (MISCELLANEOUS) ×2 IMPLANT
SPONGE LAP 18X18 X RAY DECT (DISPOSABLE) IMPLANT
STRIP CLOSURE SKIN 1/2X4 (GAUZE/BANDAGES/DRESSINGS) ×2 IMPLANT
SUT VIC AB 0 CT1 27 (SUTURE) ×2
SUT VIC AB 0 CT1 27XBRD ANTBC (SUTURE) ×3 IMPLANT
SUT VIC AB 4-0 PS2 27 (SUTURE) ×4 IMPLANT
SUT VICRYL 0 UR6 27IN ABS (SUTURE) ×2 IMPLANT
SYR BULB IRRIGATION 50ML (SYRINGE) IMPLANT
TRAP SPECIMEN MUCOUS 40CC (MISCELLANEOUS) ×1 IMPLANT
TROCAR 12M 150ML BLUNT (TROCAR) ×1 IMPLANT
TROCAR BLADELESS OPT 5 100 (ENDOMECHANICALS) ×1 IMPLANT
TROCAR XCEL 12X100 BLDLESS (ENDOMECHANICALS) ×1 IMPLANT
TUBING FILTER THERMOFLATOR (ELECTROSURGICAL) ×1 IMPLANT
TUBING INSUFFLATION 10FT LAP (TUBING) ×1 IMPLANT
WATER STERILE IRR 1500ML POUR (IV SOLUTION) ×4 IMPLANT

## 2011-04-06 NOTE — Transfer of Care (Signed)
Immediate Anesthesia Transfer of Care Note  Patient: [name] [name]  Procedure(s) Performed:  ROBOTIC ASSISTED BILATERAL SALPINGO OOPHERECTOMY - Robotic Left  Salpingoophorectomy  Patient Location: PACU  Anesthesia Type: General  Level of Consciousness: awake and alert   Airway & Oxygen Therapy: Patient Spontanous Breathing and Patient connected to face mask oxygen  Post-op Assessment: Report given to PACU RN and Post -op Vital signs reviewed and stable  Post vital signs: Reviewed and stable  Complications: No apparent anesthesia complications

## 2011-04-06 NOTE — H&P (View-Only) (Signed)
Consult Note: Gyn-Onc  [name] [name] 65 y.o. female  CC:  Chief Complaint  Patient presents with  . Gynecologic Exam    New pt, adnexal mass, elevated ca 125    HPI: Patient is seen today in consultation at the request of Dr. Ross. Ms. 6 is a 66-year-old gravida 2 para 2 who has a one year history of some left-sided pain and discomfort. She has a history of giving broad at [facility] as "iron level is too high". The last and she gave blood pressure platelet count was high. Therefore, in the setting of a high platelet count and this pain shortness he Dr. Ross is within the first was diagnosed with leukemia when her platelet count was very high. Apparently a mass was appreciated on exam. A CT scan of the abdomen and pelvis was ordered on November 14. A prior hysterectomy. Homogeneous solid appearing mass in the left adnexa which measured 4.6 x 7.5 cm. There is no evidence of internal calcification. There is no pelvic masses or lymphadenopathy identified. There is no ascites. CEA was elevated at 5.1 normal range is less than 5 in smokers. Her CA 125 was 8.   Interval History:   Review of Systems: She has had 2 episodes of nausea this week but no emesis. The pain is no longer there. It has always been intermittent. It does not wake her up at night. He does not require pain medications. She denies any change in her bowel or bladder habits. She denies any vaginal or rectal bleeding. Transient nausea other than as mentioned above no vomiting. Denies any fevers or chills. She states that she had a "6 weeks ago she was seen by Dr. Leschber and was given a steroid shot as well as some medication for a cold. She states her chest x-ray was negative. She denies any early satiety. She has lost approximately 30 pounds this year. Her husband passed away in July of this year from prostate cancer.  Current Meds:  Outpatient Encounter Prescriptions as of 03/10/2011  Medication Sig Dispense Refill  . aspirin  81 MG tablet Take 81 mg by mouth daily.        . benzonatate (TESSALON PERLES) 100 MG capsule Take 1 capsule (100 mg total) by mouth 3 (three) times daily as needed for cough.  30 capsule  0  . diazepam (VALIUM) 5 MG tablet Take 1 tablet (5 mg total) by mouth every 12 (twelve) hours as needed for anxiety.  60 tablet  1  . hydrochlorothiazide (HYDRODIURIL) 12.5 MG tablet Take 1 tablet (12.5 mg total) by mouth daily.  30 tablet  5  . methocarbamol (ROBAXIN) 500 MG tablet Take 1 tablet (500 mg total) by mouth every 8 (eight) hours as needed.  30 tablet  2  . potassium chloride (KLOR-CON) 10 MEQ CR tablet Take 1 tablet (10 mEq total) by mouth daily.  30 tablet  5  . ranitidine (ZANTAC) 150 MG capsule Take 150 mg by mouth daily.        . fluticasone (FLONASE) 50 MCG/ACT nasal spray Place 2 sprays into the nose daily.  16 g  2    Allergy:  Allergies  Allergen Reactions  . Ciprofloxacin Hives    Blisters on legs  . Codeine Nausea And Vomiting    Sweating, "passes out"  . Morphine And Related     "hospital bed was shaking"    Social Hx:   History   Social History  . Marital   Status: Widowed    Spouse Name: N/A    Number of Children: N/A  . Years of Education: N/A   Occupational History  . Not on file.   Social History Main Topics  . Smoking status: Current Everyday Smoker -- 0.5 packs/day for 50 years    Types: Cigarettes  . Smokeless tobacco: Not on file   Comment: widowed summer 2012, lives alone. Retired from work in [employer]  . Alcohol Use: No  . Drug Use: No  . Sexually Active: No   Other Topics Concern  . Not on file   Social History Narrative  . No narrative on file    Past Surgical Hx:  Past Surgical History  Procedure Date  . Cholecystectomy 03/29/09  . Appendectomy 03/29/81  . Abdominal hysterectomy 03/29/81  . Bladder tack 03/29/2002    Past Medical Hx:  Past Medical History  Diagnosis Date  . Hereditary hemochromatosis   . ASTHMA   . ALLERGIC RHINITIS     . HYPERTENSION   . GERD     Family Hx: She has 2 brothers who died of lung cancer they were both smokers. Family History  Problem Relation Age of Onset  . Breast cancer Mother   . Hypertension Mother   . Heart disease Mother   . Diabetes Father   . Heart disease Father   . Alcohol abuse Other   . Arthritis Other     Vitals:  Blood pressure 120/74, pulse 82, temperature 98.8 F (37.1 C), temperature source Oral, resp. rate 16, height 5' 2.8" (1.595 m), weight 139 lb 4.8 oz (63.186 kg).  Physical Exam: Well-nourished well-developed female in no acute distress.  Neck: Supple no lymphadenopathy no thyromegaly.  Cardiovascular: Regular rate and rhythm.  Abdomen: Well-healed surgical incisions. There is no fluid wave. Abdomen is soft nontender and nondistended there are no palpable masses or hepatosplenomegaly.  Groins: No lymphadenopathy.  Extremities: No edema.  Pelvic: Normal external female genitalia. There is a solid left adnexal mass felt at the left apex of the vagina. It has the feel of a fibroma. Rectovaginal examination reveals no nodularity.  Assessment/Plan: 66-year-old referred to us by Dr. Ross was solid a 7 cm left adnexal mass. This is notable in the setting of a normal CEA 125 and a slightly elevated CEA. The patient has never had a colonoscopy but really denies any bowel symptoms. She is a smoker. I discussed with her and her daughter's the role of surgery. 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 However, due to her pain in the solid nature in a postmenopausal woman I would advocate removal. She is in agreement with this. I believe we can do this laparoscopically. He discussed performing a laparoscopic left salpingo-oophorectomy. Second mass for frozen section and determining the need for lymphadenectomy based on frozen section.  Because of the pulmonary issue she talks about today sound like there  might be due to concerns of bronchitis. We'll defer her surgery for approximately 2-3 weeks to allow her to improve from this perspective. The most usual risks and benefits of surgery including but not limited to bleeding infection injury surrounding organs need for laparotomy were discussed the patient  Discussed the risk of embolic disease. I encourage her to pursue some smoking cessation or smoking decrease prior to surgery. Their questions were elicited in answer to to her satisfaction. She has my card and knows that I happy to   speak with her if she has any questions prior to the surgery date.  Glendon Dunwoody A., MD 03/10/2011, 12:39 PM   

## 2011-04-06 NOTE — Anesthesia Preprocedure Evaluation (Signed)
Anesthesia Evaluation  Patient identified by MRN, date of birth, ID band Patient awake    Reviewed: Allergy & Precautions, H&P , NPO status , Patient's Chart, lab work & pertinent test results  History of Anesthesia Complications (+) PONV  Airway Mallampati: II TM Distance: >3 FB Neck ROM: Full    Dental No notable dental hx.    Pulmonary neg pulmonary ROS, asthma , Recent URI , Current Smoker,  clear to auscultation  Pulmonary exam normal       Cardiovascular hypertension, neg cardio ROS Regular Normal    Neuro/Psych Negative Neurological ROS  Negative Psych ROS   GI/Hepatic negative GI ROS, Neg liver ROS, hiatal hernia, GERD-  ,  Endo/Other  Negative Endocrine ROS  Renal/GU negative Renal ROS  Genitourinary negative   Musculoskeletal negative musculoskeletal ROS (+)   Abdominal   Peds negative pediatric ROS (+)  Hematology negative hematology ROS (+)   Anesthesia Other Findings   Reproductive/Obstetrics negative OB ROS                           Anesthesia Physical Anesthesia Plan  ASA: III  Anesthesia Plan: General   Post-op Pain Management:    Induction: Intravenous  Airway Management Planned: Oral ETT  Additional Equipment:   Intra-op Plan:   Post-operative Plan: Extubation in OR and Possible Post-op intubation/ventilation  Informed Consent: I have reviewed the patients History and Physical, chart, labs and discussed the procedure including the risks, benefits and alternatives for the proposed anesthesia with the patient or authorized representative who has indicated his/her understanding and acceptance.   Dental advisory given  Plan Discussed with: CRNA  Anesthesia Plan Comments:         Anesthesia Quick Evaluation

## 2011-04-06 NOTE — Progress Notes (Signed)
Blood drawn, but IVF not started in Short Stay due to no 18 gauge veins noted

## 2011-04-06 NOTE — Anesthesia Postprocedure Evaluation (Signed)
  Anesthesia Post-op Note  Patient: [name] [name]  Procedure(s) Performed:  ROBOTIC ASSISTED BILATERAL SALPINGO OOPHERECTOMY - Robotic Left  Salpingoophorectomy  Patient Location: PACU  Anesthesia Type: General  Level of Consciousness: awake and alert   Airway and Oxygen Therapy: Patient Spontanous Breathing  Post-op Pain: mild  Post-op Assessment: Post-op Vital signs reviewed, Patient's Cardiovascular Status Stable, Respiratory Function Stable, Patent Airway and No signs of Nausea or vomiting  Post-op Vital Signs: stable  Complications: No apparent anesthesia complications

## 2011-04-06 NOTE — Anesthesia Procedure Notes (Signed)
Procedure Name: Intubation Date/Time: 04/06/2011 7:37 AM Performed by: [place], Vida Nicol C Pre-anesthesia Checklist: Timeout performed, Patient identified, Emergency Drugs available, Suction available and Patient being monitored Patient Re-evaluated:Patient Re-evaluated prior to inductionOxygen Delivery Method: Circle System Utilized Preoxygenation: Pre-oxygenation with 100% oxygen Intubation Type: IV induction Ventilation: Mask ventilation without difficulty Grade View: Grade I Tube type: Oral Tube size: 7.0 mm Number of attempts: 1 Airway Equipment and Method: stylet Placement Confirmation: ETT inserted through vocal cords under direct vision,  breath sounds checked- equal and bilateral,  positive ETCO2 and CO2 detector Secured at: 20 cm Tube secured with: Tape

## 2011-04-06 NOTE — Interval H&P Note (Signed)
History and Physical Interval Note:  04/06/2011 7:01 AM  [name] [name]  has presented today for surgery, with the diagnosis of Pelvic Mass  The various methods of treatment have been discussed with the patient and family. After consideration of risks, benefits and other options for treatment, the patient has consented to  Procedure(s): ROBOTIC ASSISTED BILATERAL SALPINGO OOPHERECTOMY as a surgical intervention .  The patients' history has been reviewed, patient examined, no change in status, stable for surgery.  I have reviewed the patients' chart and labs.  Questions were answered to the patient's satisfaction.     [name] [name] A.

## 2011-04-06 NOTE — Op Note (Signed)
PATIENT: [name] [name] DATE OF BIRTH: 1945-04-21 ENCOUNTER DATE:    Preop Diagnosis: Solid left adnexal mass  Postoperative Diagnosis: Fibroma.   Surgery: Robotic left salpingo-oophorectomy  Surgeons:  Rejeana Brock A. Duard Brady, MD; Antionette Char, MD   Assistant: None  Anesthesia: General   Estimated blood loss: 10 ml   IVF: 2000 ml   Urine output 200 ml   Complications: None   Pathology: Left tube and ovary  Operative findings: Surgically absent right tube and ovary. Status post hysterectomy. 8 cm solid left adnexal mass.  Procedure: The patient was identified in the preoperative holding area. Informed consent was signed on the chart. Patient was seen history was reviewed and exam was performed.   The patient was then taken to the operating room and placed in the supine position with SCD hose on. General anesthesia was then induced without difficulty. She was then placed in the dorsolithotomy position. Her arms were tucked at her side with appropriate precautions on the gel pad. Shoulder blocks were then placed in the usual fashion with appropriate precautions. A OG-tube was placed to suction. First timeout was performed to confirm the patient procedure antibiotic allergy status, estimated estimated blood loss and OR time. The perineum was then prepped in the usual fashion with Betadine. A 14 French Foley was inserted into the bladder under sterile conditions.The abdomen was then prepped with 1 Chlor prep sponges per protocol.   Patient was then draped after the prep was dried. Second timeout was performed to confirm the above. After again confirming OG tube placement and it was to suction. A stab-wound was made in left upper quadrant 2 cm below the costal margin on the left in the midclavicular line. A 5 mm operative report was used to assure intra-abdominal placement. The abdomen was insufflated. At this point all points during the procedure the patient's intra-abdominal pressure was  not increased over 15 mm of mercury. After insufflation was complete, the patient was placed in deep Trendelenburg position. 25 cm above the pubic symphysis that area was marked the camera port. Bilateral robotic ports were marked 10 cm from the midline incision at approximately 5 angle. Under direct visualization each of the trochars was placed into the abdomen. The small bowel was folded on its mesentery to allow visualization to the pelvis. The 5 mm LUQ port was then converted to a 10/12 port under direct visualization.  After assuring adequate visualization, the robot was then docked in the usual fashion. Under direct visualization the robotic instruments replaced.   Abdominal pelvic washings were obtained. The posterior leaf of the broad ligament on the left side was opened. The ureter was identified. A window was made between the IP ligament and the ureter. The IP was coagulated and transected. The ureter was gently dissected off the medial peritoneum. Medial leaf the ureter was adhesed to the mass and pinpoint dissection was used to meticulously take the ureter off of the mass. The adnexal mass was then amputated and placed in a large Endo catch bag.  The Endo catch bag was brought up through the 10/12 left upper quadrant port.  This point the robotic instruments were removed and the robot was undocked For approximately one hour the mass was morcellated to be removed through the 10/12 port. The mass was sent for frozen section. Frozen section returned fibroma. Endo catch bag was inspected it was intact.  The left upper quadrant partial incision was reapproximated with 0 Vicryl on a UR 6. Similar to 10/12 vaginal  incision was reapproximated with UR 6. The skin was closed using 4 Vicryl. Steri-Strips and benzoin were applied. The Foley catheter was removed without difficulty.   All instrument needle and Ray-Tec counts were correct x2. The patient tolerated the procedure well and was taken to the recovery  room in stable condition. This is [name] [name] dictating an operative note on patient [name] [name].

## 2011-04-07 ENCOUNTER — Telehealth: Payer: Self-pay | Admitting: *Deleted

## 2011-04-07 DIAGNOSIS — H6692 Otitis media, unspecified, left ear: Secondary | ICD-10-CM

## 2011-04-07 MED ORDER — AMOXICILLIN-POT CLAVULANATE 875-125 MG PO TABS: 1.0000 | ORAL_TABLET | Freq: Two times a day (BID) | ORAL | Status: AC

## 2011-04-07 NOTE — Telephone Encounter (Signed)
Pt states she just had surgery yesterday and not able to come for ov. C/o of cough w/sinus drainage. Denies any other sxs, no fever, no sob. Pt is requesting md to rx something...04/07/11@ 4:09pm/LMB

## 2011-04-07 NOTE — Telephone Encounter (Signed)
augmentin - erx done

## 2011-04-07 NOTE — Telephone Encounter (Signed)
Notified pt with md response...04/07/11@5 :02pm/LMB

## 2011-04-08 ENCOUNTER — Telehealth: Payer: Self-pay | Admitting: Gynecologic Oncology

## 2011-04-08 NOTE — Telephone Encounter (Signed)
Patient called and informed of final path report and follow up appointment made for Feb 6th.  Pt stating that she was sore but maintaining mobility.  Reported no issues with bowel and bladder function post-op.  Instructed to increase fluids and avoid constipation if narcotics used.  Instructed to call with concerns or questions as needed.

## 2011-04-13 ENCOUNTER — Encounter: Payer: Self-pay | Admitting: Internal Medicine

## 2011-04-13 ENCOUNTER — Ambulatory Visit (INDEPENDENT_AMBULATORY_CARE_PROVIDER_SITE_OTHER): Payer: Medicare Other | Admitting: Internal Medicine

## 2011-04-13 VITALS — BP 102/80 | HR 106 | Temp 98.5°F

## 2011-04-13 DIAGNOSIS — J45901 Unspecified asthma with (acute) exacerbation: Secondary | ICD-10-CM

## 2011-04-13 DIAGNOSIS — J309 Allergic rhinitis, unspecified: Secondary | ICD-10-CM

## 2011-04-13 DIAGNOSIS — J40 Bronchitis, not specified as acute or chronic: Secondary | ICD-10-CM

## 2011-04-13 MED ORDER — METHOCARBAMOL 500 MG PO TABS: 500.0000 mg | ORAL_TABLET | Freq: Three times a day (TID) | ORAL | Status: DC | PRN

## 2011-04-13 MED ORDER — PREDNISONE (PAK) 10 MG PO TABS: 10.0000 mg | ORAL_TABLET | ORAL | Status: AC

## 2011-04-13 MED ORDER — METHYLPREDNISOLONE ACETATE 80 MG/ML IJ SUSP
120.0000 mg | Freq: Once | INTRAMUSCULAR | Status: AC
  Administered 2011-04-13: 120 mg via INTRAMUSCULAR

## 2011-04-13 NOTE — Patient Instructions (Signed)
It was good to see you today. Steroid shot given today for your allergy and asthma inflammation Also pred taper as discussed - Your prescription(s) have been submitted to your pharmacy. Please take as directed and contact our office if you believe you are having problem(s) with the medication(s). If you develop worsening symptoms or fever, call and we can reconsider antibiotics, but it does not appear necessary to use antibiotics at this time.

## 2011-04-13 NOTE — Progress Notes (Signed)
  Subjective:    Patient ID: [name] [name], female    DOB: 09-12-1945, 66 y.o.   MRN: [id number]  HPI  complains of continued allergies - sinus, chest congestion improved with augmentin - no fever and decreased sputum  Past Medical History  Diagnosis Date  . Hereditary hemochromatosis   . ASTHMA   . ALLERGIC RHINITIS   . HYPERTENSION   . GERD   . PONV (postoperative nausea and vomiting)     and difficulty waking up   . Dysrhythmia     pt reports " heart beats fast "   . Recurrent upper respiratory infection (URI)     12/12 still has cough antibiotic completed 3 weeks ago   . Pneumonia     hx of never in hospital   . Chronic kidney disease     hx of uti   . Arthritis     left shoulder   . Headache     sinus headaches   . H/O hiatal hernia   . Anxiety     Review of Systems  Constitutional: Negative for fever and fatigue.  Respiratory: Positive for cough and wheezing. Negative for shortness of breath and stridor.   Cardiovascular: Negative for chest pain, palpitations and leg swelling.       Objective:   Physical Exam BP 102/80  Pulse 106  Temp(Src) 98.5 F (36.9 C) (Oral)  SpO2 96% Wt Readings from Last 3 Encounters:  04/02/11 136 lb 9.6 oz (61.961 kg)  03/10/11 139 lb 4.8 oz (63.186 kg)  01/18/11 137 lb 6.4 oz (62.324 kg)   Constitutional: She appears well-developed and well-nourished. No distress.  HENT: Head: no sinus tenderness. Normocephalic and atraumatic. Ears: B TMs ok, no erythema or effusion; Nose: Nose normal. Mouth/Throat: Oropharynx is clear and moist. No oropharyngeal exudate.  Eyes: Conjunctivae and EOM are normal. Pupils are equal, round, and reactive to light. No scleral icterus.  Neck: Normal range of motion. Neck supple. No JVD present. No thyromegaly present.  Cardiovascular: Normal rate, regular rhythm and normal heart sounds.  No murmur heard. No BLE edema. Pulmonary/Chest: Effort normal and breath sounds with mild end exp wheeze. No  respiratory distress.  Skin: mild erythema of hands (excema) Skin is warm and dry.  Psychiatric: She has a normal mood and affect. Her behavior is normal. Judgment and thought content normal.   Lab Results  Component Value Date   WBC 9.7 04/02/2011   HGB 14.8 04/02/2011   HCT 42.5 04/02/2011   PLT 235 04/02/2011   GLUCOSE 92 04/02/2011   CHOL 201 11/11/2009   HDL 34 11/11/2009   LDLCALC 103 11/11/2009   ALT 13 04/02/2011   AST 14 04/02/2011   NA 139 04/02/2011   K 4.0 04/02/2011   CL 102 04/02/2011   CREATININE 0.85 04/02/2011   BUN 15 04/02/2011   CO2 28 04/02/2011   TSH 1.43 01/18/2011       Assessment & Plan:  Allergic sinusitis Bronchitis, resolved  Mild bronchospasm - asthma flare  IM medrol and pred dose pak - continue other meds as ongoing - no additional antibiotics needed

## 2011-05-05 ENCOUNTER — Encounter: Payer: Self-pay | Admitting: Gynecologic Oncology

## 2011-05-05 ENCOUNTER — Ambulatory Visit: Payer: Medicare Other | Attending: Gynecologic Oncology | Admitting: Gynecologic Oncology

## 2011-05-05 VITALS — BP 120/70 | HR 88 | Temp 98.7°F | Resp 14 | Ht 62.95 in | Wt 138.3 lb

## 2011-05-05 DIAGNOSIS — I10 Essential (primary) hypertension: Secondary | ICD-10-CM | POA: Insufficient documentation

## 2011-05-05 DIAGNOSIS — R1900 Intra-abdominal and pelvic swelling, mass and lump, unspecified site: Secondary | ICD-10-CM

## 2011-05-05 DIAGNOSIS — Z7982 Long term (current) use of aspirin: Secondary | ICD-10-CM | POA: Insufficient documentation

## 2011-05-05 DIAGNOSIS — Z9079 Acquired absence of other genital organ(s): Secondary | ICD-10-CM | POA: Insufficient documentation

## 2011-05-05 DIAGNOSIS — Z09 Encounter for follow-up examination after completed treatment for conditions other than malignant neoplasm: Secondary | ICD-10-CM | POA: Insufficient documentation

## 2011-05-05 DIAGNOSIS — Z79899 Other long term (current) drug therapy: Secondary | ICD-10-CM | POA: Insufficient documentation

## 2011-05-05 DIAGNOSIS — K219 Gastro-esophageal reflux disease without esophagitis: Secondary | ICD-10-CM | POA: Insufficient documentation

## 2011-05-05 DIAGNOSIS — F17200 Nicotine dependence, unspecified, uncomplicated: Secondary | ICD-10-CM | POA: Insufficient documentation

## 2011-05-06 ENCOUNTER — Encounter: Payer: Self-pay | Admitting: Gynecologic Oncology

## 2011-05-06 NOTE — Patient Instructions (Signed)
RTC to necessary.

## 2011-05-06 NOTE — Progress Notes (Signed)
Consult Note: Gyn-Onc  [name] [name] 66 y.o. female  CC:  Chief Complaint  Patient presents with  . Pelvic Mass    Follow up(post-op)    HPI:[name] [name] is a 66 year old gravida 2 para 2 who has a one year history of some left-sided pain and discomfort. She has a history of giving blood at the [facility] as "iron level is too high". The last time that she gave blood, her platelet count was high. Therefore, in the setting of a high platelet count and this pain, Dr. Tenny Craw evaluated her. A mass was appreciated on exam and a CT scan of the abdomen and pelvis was ordered on November 14. The CT revealed a homogeneous solid appearing mass in the left adnexa which measured 4.6 x 7.5 cm. There is no evidence of internal calcification. There is no pelvic masses or lymphadenopathy identified. There is no ascites. CEA was elevated at 5.1 normal range is less than 5 in smokers. Her CA 125 was 8.    Interval History:  On 04/06/11 she underwent a robotic LSO. Operative findings: Surgically absent right tube and ovary. Status post hysterectomy. 8 cm solid left adnexal mass. Pathology revealed: Diagnosis 1. Ovary and fallopian tube, left - BENIGN OVARIAN FIBROMA, NO EVIDENCE OF ATYPIA OR MALIGNANCY. - BENIGN FALLOPIAN TUBAL TISSUE, NO EVIDENCE OF ENDOMETRIOSIS, ATYPIA OR MALIGNANCY. 2. Ovary and fallopian tube, left, additional portion - BENIGN OVARIAN FIBROMA, NO EVIDENCE OF ATYPIA OR MALIGNANCY.  She comes in today for her post-operative check.  She is doing very well and is very pleased with how she did with surgery and with the pathology report.  She is looking for a specialist to counsel her regarding her hemochromatosis.   Review of Systems  Current Meds:  Outpatient Encounter Prescriptions as of 05/05/2011  Medication Sig Dispense Refill  . aspirin 81 MG tablet Take 81 mg by mouth daily after breakfast.       . diazepam (VALIUM) 5 MG tablet Take 1 tablet (5 mg total) by mouth every 12 (twelve)  hours as needed for anxiety.  60 tablet  1  . fexofenadine (ALLEGRA) 180 MG tablet Take 180 mg by mouth daily.        . fluticasone (FLONASE) 50 MCG/ACT nasal spray Place 2 sprays into the nose daily as needed. Allergies       . hydrochlorothiazide (HYDRODIURIL) 12.5 MG tablet Take 12.5 mg by mouth daily after breakfast.       . methocarbamol (ROBAXIN) 500 MG tablet Take 1 tablet (500 mg total) by mouth every 8 (eight) hours as needed. Muscle spasm  30 tablet  1  . potassium chloride (KLOR-CON) 10 MEQ CR tablet Take 1 tablet (10 mEq total) by mouth daily.  30 tablet  5  . ranitidine (ZANTAC) 150 MG capsule Take 150 mg by mouth 2 (two) times daily.       . benzonatate (TESSALON PERLES) 100 MG capsule Take 1 capsule (100 mg total) by mouth 3 (three) times daily as needed for cough.  30 capsule  0  . oxyCODONE-acetaminophen (PERCOCET) 5-325 MG per tablet Take 2 tablets by mouth every 6 (six) hours as needed for pain.  30 tablet  0    Allergy:  Allergies  Allergen Reactions  . Ciprofloxacin Hives    Blisters on legs  . Codeine Nausea And Vomiting    Sweating, "passes out"  . Morphine And Related     "hospital bed was shaking"    Social Hx:  History   Social History  . Marital Status: Widowed    Spouse Name: N/A    Number of Children: N/A  . Years of Education: N/A   Occupational History  . Not on file.   Social History Main Topics  . Smoking status: Current Everyday Smoker -- 0.5 packs/day for 50 years    Types: Cigarettes  . Smokeless tobacco: Never Used   Comment: widowed summer 2012, lives alone. Retired from work in [employer]  . Alcohol Use: No  . Drug Use: No  . Sexually Active: No   Other Topics Concern  . Not on file   Social History Narrative  . No narrative on file    Past Surgical Hx:  Past Surgical History  Procedure Date  . Cholecystectomy 03/29/09  . Appendectomy 03/29/81  . Abdominal hysterectomy 03/29/81  . Bladder tack 03/29/2002  . Other surgical  history     cyst removed from intestines to ovary    . Other surgical history     tendon surgery left wrist     Past Medical Hx:  Past Medical History  Diagnosis Date  . Hereditary hemochromatosis   . ASTHMA   . ALLERGIC RHINITIS   . HYPERTENSION   . GERD   . PONV (postoperative nausea and vomiting)     and difficulty waking up   . Dysrhythmia     pt reports " heart beats fast "   . Chronic kidney disease   . Arthritis     left shoulder   . Headache     sinus headaches   . H/O hiatal hernia   . Anxiety     Family Hx:  Family History  Problem Relation Age of Onset  . Breast cancer Mother   . Hypertension Mother   . Heart disease Mother   . Diabetes Father   . Heart disease Father   . Alcohol abuse Other   . Arthritis Other     Vitals:  Blood pressure 120/70, pulse 88, temperature 98.7 F (37.1 C), temperature source Oral, resp. rate 14, height 5' 2.95" (1.599 m), weight 138 lb 4.8 oz (62.732 kg).  Physical Exam:  Well nourished, well developed female in no acute distress.  Abdomen: Well healed skin incisions, no appreciable hernias.  Non-tender.  Assessment/Plan: 66 year old s/p LSO for a large ovarian fibroma.  She is doing well post-operatively and will be released from our clinic.  She was given names of providers that she can be referred to for her hemochromatosis.  She can return to see [place] prn.  [name] [name] A., MD 05/06/2011, 6:12 AM

## 2011-05-21 ENCOUNTER — Ambulatory Visit: Payer: Medicare Other | Admitting: Internal Medicine

## 2011-05-22 ENCOUNTER — Other Ambulatory Visit: Payer: Self-pay | Admitting: Internal Medicine

## 2011-05-26 ENCOUNTER — Other Ambulatory Visit (INDEPENDENT_AMBULATORY_CARE_PROVIDER_SITE_OTHER): Payer: Medicare Other

## 2011-05-26 ENCOUNTER — Encounter: Payer: Self-pay | Admitting: Internal Medicine

## 2011-05-26 ENCOUNTER — Ambulatory Visit (INDEPENDENT_AMBULATORY_CARE_PROVIDER_SITE_OTHER): Payer: Medicare Other | Admitting: Internal Medicine

## 2011-05-26 DIAGNOSIS — R002 Palpitations: Secondary | ICD-10-CM

## 2011-05-26 DIAGNOSIS — M62838 Other muscle spasm: Secondary | ICD-10-CM

## 2011-05-26 DIAGNOSIS — I10 Essential (primary) hypertension: Secondary | ICD-10-CM

## 2011-05-26 LAB — CBC WITH DIFFERENTIAL/PLATELET
Basophils Relative: 0.2 % (ref 0.0–3.0)
Eosinophils Absolute: 0 10*3/uL (ref 0.0–0.7)
Eosinophils Relative: 0.4 % (ref 0.0–5.0)
Hemoglobin: 14.6 g/dL (ref 12.0–15.0)
Lymphocytes Relative: 20.7 % (ref 12.0–46.0)
MCHC: 34.8 g/dL (ref 30.0–36.0)
Neutro Abs: 5.3 10*3/uL (ref 1.4–7.7)
Neutrophils Relative %: 68.2 % (ref 43.0–77.0)
RBC: 4.29 Mil/uL (ref 3.87–5.11)
WBC: 7.7 10*3/uL (ref 4.5–10.5)

## 2011-05-26 LAB — HEPATIC FUNCTION PANEL
ALT: 19 U/L (ref 0–35)
AST: 19 U/L (ref 0–37)
Alkaline Phosphatase: 83 U/L (ref 39–117)
Bilirubin, Direct: 0.1 mg/dL (ref 0.0–0.3)
Total Bilirubin: 0.4 mg/dL (ref 0.3–1.2)
Total Protein: 6.6 g/dL (ref 6.0–8.3)

## 2011-05-26 MED ORDER — CYCLOBENZAPRINE HCL 5 MG PO TABS: 5.0000 mg | ORAL_TABLET | Freq: Three times a day (TID) | ORAL | Status: DC | PRN

## 2011-05-26 MED ORDER — FEXOFENADINE HCL 180 MG PO TABS: 180.0000 mg | ORAL_TABLET | Freq: Every day | ORAL | Status: DC

## 2011-05-26 MED ORDER — PROPRANOLOL HCL ER 60 MG PO CP24: 60.0000 mg | ORAL_CAPSULE | Freq: Every day | ORAL | Status: DC

## 2011-05-26 NOTE — Progress Notes (Signed)
Subjective:    Patient ID: [name] [name], female    DOB: [birth year], 66 y.o.   MRN: [id number]  HPI  Here for follow up - reviewed chronic medical illness  hemachromatosis - controls same with voluntary blood donation every 3 months - no hx liver problems or abnormal labs - request heme eval for same  hypertension - reports compliance with ongoing medical treatment and no changes in medication dose or frequency. denies adverse side effects related to current therapy. Increase palpitatins and "nerves" about ovarian mass scare 03/2011 (no cancer)  GERD - reports compliance with ongoing medical treatment and no changes in medication dose or frequency. denies adverse side effects related to current therapy.   allergic rhinitis - associated with sinusitus - usually controls same with otc meds and neti pot use - recent increase in maxillary sinus pressure and left ear pressure consistent with prior infection symptoms - no fever or purulent discharge, no HA or fever   Past Medical History  Diagnosis Date  . Hereditary hemochromatosis   . ASTHMA   . ALLERGIC RHINITIS   . HYPERTENSION   . GERD   . PONV (postoperative nausea and vomiting)     and difficulty waking up   . Dysrhythmia     pt reports " heart beats fast "   . Chronic kidney disease   . Arthritis     left shoulder   . Headache     sinus headaches   . H/O hiatal hernia   . Anxiety     Review of Systems  Constitutional: Positive for fatigue. Negative for unexpected weight change.  HENT: Positive for congestion and postnasal drip. Negative for tinnitus.   Respiratory: Negative for cough and shortness of breath.   Genitourinary: Negative for dysuria.  Neurological: Negative for numbness and headaches.  Psychiatric/Behavioral: Negative for behavioral problems and confusion. The patient is nervous/anxious.        Objective:   Physical Exam  BP 120/82  Pulse 105  Temp(Src) 97.8 F (36.6 C) (Oral)  Ht 5\' 2"  (1.575 m)   Wt 138 lb 12.8 oz (62.959 kg)  BMI 25.39 kg/m2  SpO2 95% Wt Readings from Last 3 Encounters:  05/26/11 138 lb 12.8 oz (62.959 kg)  05/05/11 138 lb 4.8 oz (62.732 kg)  04/02/11 136 lb 9.6 oz (61.961 kg)    Constitutional: She is well-developed and well-nourished. No distress. Mild resting head/neck tremor Mouth/Throat: Oropharynx is clear and moist. No oropharyngeal exudate.  Eyes: Conjunctivae and EOM are normal. Pupils are equal, round, and reactive to light. No scleral icterus.  Neck: Normal range of motion. Neck supple. No JVD or LAD present. No thyromegaly present.  Cardiovascular: Normal rate, regular rhythm and normal heart sounds.  No murmur heard. No BLE edema. Pulmonary/Chest: Effort normal and breath sounds normal. No respiratory distress. She has no wheezes.  Psychiatric: She has an appropriately depressed mood and affect, tearful. Her behavior is normal. Judgment and thought content normal.   Lab Results  Component Value Date   WBC 9.7 04/02/2011   HGB 14.8 04/02/2011   HCT 42.5 04/02/2011   PLT 235 04/02/2011   CHOL 201 11/11/2009   HDL 34 11/11/2009   ALT 13 4/0/9811   AST 14 04/02/2011   NA 139 04/02/2011   K 4.0 04/02/2011   CL 102 04/02/2011   CREATININE 0.85 04/02/2011   BUN 15 04/02/2011   CO2 28 04/02/2011   TSH 1.43 01/18/2011   Lab Results  Component Value  Date   IRON 43 05/15/2010   FERRITIN 12.8 01/18/2011      Assessment & Plan:  See problem list. Medications and labs reviewed today.  Palpitations - check TSH and start inderal - may also help essential tremor symptoms  Muscle spasm, neck - chronic - change robaxin to flexeril prn

## 2011-05-26 NOTE — Assessment & Plan Note (Signed)
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

## 2011-05-26 NOTE — Patient Instructions (Signed)
It was good to see you today. Test(s) ordered today. Your results will be called to you after review (48-72hours after test completion). If any changes need to be made, you will be notified at that time. Stop hydrochlorothiazide and start Inderal for blood pressure palpitations and nerves Stop Robaxin and start Flexeril for neck muscle spasm Your prescription(s) have been submitted to your pharmacy. Please take as directed and contact our office if you believe you are having problem(s) with the medication(s). Other medications without change Will refer to hematologist for evaluation of hemochromocytosis as discussed Please schedule followup in 4-6 months for blood pressure and iron check, call sooner if problems.

## 2011-05-26 NOTE — Assessment & Plan Note (Addendum)
on hctz,  Will change to low dose inderal to help palpitations and tremor symptoms   BP Readings from Last 3 Encounters:  05/26/11 120/82  05/05/11 120/70  04/13/11 102/80

## 2011-05-28 ENCOUNTER — Telehealth: Payer: Self-pay | Admitting: *Deleted

## 2011-05-28 NOTE — Telephone Encounter (Signed)
Received PA form for cyclobenzaprine. Called insurance spoke with [place]/rep gave clinical information over phone. Med has been approve thru March 28, 2012. Notified pharmacy gave approval status.... 05/28/11@11 :47am/LMB

## 2011-06-03 ENCOUNTER — Telehealth: Payer: Self-pay | Admitting: Oncology

## 2011-06-03 NOTE — Telephone Encounter (Signed)
S/w the pt and she is aware of her new pt appt with dr ha on 06/11/2011

## 2011-06-07 ENCOUNTER — Ambulatory Visit: Payer: Medicare Other

## 2011-06-10 ENCOUNTER — Other Ambulatory Visit: Payer: Self-pay | Admitting: Oncology

## 2011-06-11 ENCOUNTER — Ambulatory Visit (HOSPITAL_BASED_OUTPATIENT_CLINIC_OR_DEPARTMENT_OTHER): Payer: Medicare Other | Admitting: Oncology

## 2011-06-11 ENCOUNTER — Other Ambulatory Visit: Payer: Self-pay | Admitting: Oncology

## 2011-06-11 ENCOUNTER — Other Ambulatory Visit (HOSPITAL_BASED_OUTPATIENT_CLINIC_OR_DEPARTMENT_OTHER): Payer: Medicare Other | Admitting: Lab

## 2011-06-11 ENCOUNTER — Ambulatory Visit: Payer: Medicare Other

## 2011-06-11 ENCOUNTER — Telehealth: Payer: Self-pay | Admitting: Oncology

## 2011-06-11 ENCOUNTER — Encounter: Payer: Self-pay | Admitting: Oncology

## 2011-06-11 DIAGNOSIS — E876 Hypokalemia: Secondary | ICD-10-CM

## 2011-06-11 DIAGNOSIS — I10 Essential (primary) hypertension: Secondary | ICD-10-CM

## 2011-06-11 DIAGNOSIS — Z862 Personal history of diseases of the blood and blood-forming organs and certain disorders involving the immune mechanism: Secondary | ICD-10-CM

## 2011-06-11 LAB — CBC WITH DIFFERENTIAL/PLATELET
BASO%: 0.5 % (ref 0.0–2.0)
Eosinophils Absolute: 0 10*3/uL (ref 0.0–0.5)
LYMPH%: 17.5 % (ref 14.0–49.7)
MCHC: 34.3 g/dL (ref 31.5–36.0)
MONO#: 0.6 10*3/uL (ref 0.1–0.9)
MONO%: 5.9 % (ref 0.0–14.0)
NEUT#: 7.6 10*3/uL — ABNORMAL HIGH (ref 1.5–6.5)
RBC: 4.21 10*6/uL (ref 3.70–5.45)
RDW: 13 % (ref 11.2–14.5)
WBC: 10 10*3/uL (ref 3.9–10.3)

## 2011-06-11 NOTE — Telephone Encounter (Signed)
Gv pt appt for [id number]

## 2011-06-11 NOTE — Progress Notes (Signed)
[facility] INITIAL HEMATOLOGY CONSULTATION  Referral MD:   Rene Paci, MD, MD  Reason for Referral: hemochromatosis.   HPI: Mrs. [name] [name] is a 66 year old Caucasian woman with no significant past medical history.  She had 3 brothers who died from cirrhosis.  Her previous PCP in [place] tested her for hemochromatosis which came back positive in 2004.  This physician is no longer in practice; no result of hemochromatosis is thus available for confirmation of her diagnosis.  She was on therapeutic phlebotomy every 3 months with [facility].  She recently transferred her care to Dr. Felicity Coyer.  Given her diagnosis of hemochromatosis, she was kindly referred to the [facility] for evaluation.  Mrs. Huxtable presented to the clinic by herself today. She has sinus congestion from her allergy.  With respect to reported history of hemochromatosis, she has arthralgia in her hands which is worse in the morning but improves as the day goes on.  She denies joint swellings in the hands or elsewhere.  She denies depression, dark skin, SOB, DOE, pedal edema, abdominal pain, nausea/vomiting, juandice, early satiety, change in bowel/bladder habit.     Past Medical History  Diagnosis Date  . Hereditary hemochromatosis 2004  . ASTHMA   . ALLERGIC RHINITIS   . HYPERTENSION   . GERD   . PONV (postoperative nausea and vomiting)     and difficulty waking up   . Dysrhythmia     pt reports " heart beats fast "   . Chronic kidney disease   . Arthritis     left shoulder   . Headache     sinus headaches   . H/O hiatal hernia   . Anxiety   :    Past Surgical History  Procedure Date  . Cholecystectomy 03/29/09  . Appendectomy 03/29/81  . Abdominal hysterectomy 03/29/81  . Bladder tack 03/29/2002  . Other surgical history     cyst removed from intestines to ovary    . Other surgical history     tendon surgery left wrist   :   CURRENT MEDS: Current Outpatient  Prescriptions  Medication Sig Dispense Refill  . diazepam (VALIUM) 5 MG tablet Take 1 tablet (5 mg total) by mouth every 12 (twelve) hours as needed for anxiety.  60 tablet  1  . fexofenadine (ALLEGRA) 180 MG tablet Take 1 tablet (180 mg total) by mouth daily.  30 tablet  3  . hydrochlorothiazide (HYDRODIURIL) 12.5 MG tablet Take 12.5 mg by mouth daily.      Marland Kitchen KLOR-CON M10 10 MEQ tablet TAKE 1 TABLET BY MOUTH EVERY DAY  30 tablet  5  . methocarbamol (ROBAXIN) 500 MG tablet Take 500 mg by mouth every 8 (eight) hours as needed.      . ranitidine (ZANTAC) 150 MG capsule Take 150 mg by mouth daily.       . propranolol (INDERAL LA) 60 MG 24 hr capsule Take 1 capsule (60 mg total) by mouth daily.  30 capsule  6  . DISCONTD: potassium chloride (KLOR-CON) 10 MEQ CR tablet Take 1 tablet (10 mEq total) by mouth daily.  30 tablet  5      Allergies  Allergen Reactions  . Ciprofloxacin Hives    Blisters on legs  . Codeine Nausea And Vomiting    Sweating, "passes out"  . Morphine And Related     "hospital bed was shaking"  :  Family History  Problem Relation Age of Onset  .  Breast cancer Mother   . Hypertension Mother   . Heart disease Mother   . Cancer Mother 71    breast cancer  . Diabetes Father   . Heart disease Father   . Alcohol abuse Other   . Arthritis Other   . Cirrhosis Brother   . Cirrhosis Brother   . Cirrhosis Brother   :  History   Social History  . Marital Status: Widowed    Spouse Name: N/A    Number of Children: 2  . Years of Education: N/A   Occupational History  .      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    Social History Main Topics  . Smoking status: Current Everyday Smoker -- 0.5 packs/day for 50 years    Types: Cigarettes  . Smokeless tobacco: Never Used   Comment: widowed summer 2012, lives alone. Retired from work in [employer]  . Alcohol Use: No  . Drug Use: No  . Sexually Active: No   Other Topics Concern  . Not  on file   Social History Narrative  . No narrative on file    REVIEW OF SYSTEM:  The rest of the 14-point review of sytem was negative.   Exam: ECOG 0  General:  well-nourished in no acute distress.  Eyes:  no scleral icterus.  ENT:  There were no oropharyngeal lesions.  Neck was without thyromegaly.  Lymphatics:  Negative cervical, supraclavicular or axillary adenopathy.  Respiratory: lungs were clear bilaterally without wheezing or crackles.  Cardiovascular:  Regular rate and rhythm, S1/S2, without murmur, rub or gallop.  There was no pedal edema.  GI:  abdomen was soft, flat, nontender, nondistended, without organomegaly.  Muscoloskeletal:  no spinal tenderness of palpation of vertebral spine.  Skin exam was without echymosis, petichae.  Neuro exam was nonfocal.  Patient was able to get on and off exam table without assistance.  Gait was normal.  Patient was alerted and oriented.  Attention was good.   Language was appropriate.  Mood was normal without depression.  Speech was not pressured.  Thought content was not tangential.    LABS:  Lab Results  Component Value Date   WBC 10.0 06/11/2011   HGB 14.3 06/11/2011   HCT 41.8 06/11/2011   PLT 237 06/11/2011   GLUCOSE 92 04/02/2011   CHOL 201 11/11/2009   HDL 34 11/11/2009   LDLCALC 103 11/11/2009   ALT 19 05/26/2011   AST 19 05/26/2011   NA 139 04/02/2011   K 4.0 04/02/2011   CL 102 04/02/2011   CREATININE 0.85 04/02/2011   BUN 15 04/02/2011   CO2 28 04/02/2011   TSH 1.43 01/18/2011   AFP:  Pending LFT:  Pending.   ASSESSMENT AND PLAN:   1.  Reported history of hemochromatosis:  - Diagnosis: since the physician who diagnosed this is no longer in practice, diagnostic result for her hemochromatosis is not available for confirmation.  I sent hemochromatosis panel again today.   - Prognosis:  I discussed with patient pathophysiology of hemochromatosis and end organ damages which include but not limited to CHF, cirrhosis, hepatocellular carcinoma,  diabetes, arthritis.  With treatment, some of the complications may be avoided.  - Treatment:  Her ferritin in 04/2011 was less than 50 most likely due to recent gynecologic surgery for her ovarian cysts.  I advised patient that if her hemochromatosis is confirmed with our testing here, then I will recommend every 3 months CBC  at the [facility] and one unit pRBC phlebotomy if her ferritin is >50.   - Surveillance:  I will monitor her LFT and AFP.  If these becomes abnormal, then an abdominal US will be indicated.    2.  HTN:  On HCTZ per PCP.  3.  Hypokalemia:  Due to HCTZ.  She is on KCl per PCP.   4.  Follow up:  RV with me in about one year.    The length of time of the face-to-face encounter was 30 minutes. More than 50% of time was spent counseling and coordination of care.

## 2011-06-15 LAB — AFP TUMOR MARKER: AFP-Tumor Marker: 6.8 ng/mL (ref 0.0–8.0)

## 2011-06-15 LAB — HEMOCHROMATOSIS DNA-PCR(C282Y,H63D)

## 2011-07-08 ENCOUNTER — Ambulatory Visit: Payer: Medicare Other | Admitting: Oncology

## 2011-07-16 ENCOUNTER — Observation Stay (HOSPITAL_COMMUNITY): Admit: 2011-07-16 | Payer: Medicare Other

## 2011-07-16 ENCOUNTER — Observation Stay (HOSPITAL_COMMUNITY)
Admission: EM | Admit: 2011-07-16 | Discharge: 2011-07-16 | Disposition: A | Discharge status: Home / Self Care | Payer: Medicare Other | Attending: Emergency Medicine | Admitting: Emergency Medicine

## 2011-07-16 ENCOUNTER — Encounter (HOSPITAL_COMMUNITY): Payer: Self-pay | Admitting: *Deleted

## 2011-07-16 ENCOUNTER — Emergency Department (HOSPITAL_COMMUNITY): Payer: Medicare Other

## 2011-07-16 ENCOUNTER — Observation Stay (HOSPITAL_COMMUNITY): Payer: Medicare Other

## 2011-07-16 DIAGNOSIS — E876 Hypokalemia: Secondary | ICD-10-CM | POA: Insufficient documentation

## 2011-07-16 DIAGNOSIS — M19019 Primary osteoarthritis, unspecified shoulder: Secondary | ICD-10-CM | POA: Insufficient documentation

## 2011-07-16 DIAGNOSIS — I129 Hypertensive chronic kidney disease with stage 1 through stage 4 chronic kidney disease, or unspecified chronic kidney disease: Secondary | ICD-10-CM | POA: Insufficient documentation

## 2011-07-16 DIAGNOSIS — F411 Generalized anxiety disorder: Secondary | ICD-10-CM | POA: Insufficient documentation

## 2011-07-16 DIAGNOSIS — R51 Headache: Secondary | ICD-10-CM | POA: Insufficient documentation

## 2011-07-16 DIAGNOSIS — R0602 Shortness of breath: Secondary | ICD-10-CM | POA: Insufficient documentation

## 2011-07-16 DIAGNOSIS — I2510 Atherosclerotic heart disease of native coronary artery without angina pectoris: Secondary | ICD-10-CM

## 2011-07-16 DIAGNOSIS — R079 Chest pain, unspecified: Principal | ICD-10-CM | POA: Insufficient documentation

## 2011-07-16 DIAGNOSIS — K219 Gastro-esophageal reflux disease without esophagitis: Secondary | ICD-10-CM | POA: Insufficient documentation

## 2011-07-16 DIAGNOSIS — N189 Chronic kidney disease, unspecified: Secondary | ICD-10-CM | POA: Insufficient documentation

## 2011-07-16 LAB — POCT I-STAT TROPONIN I
Troponin i, poc: 0 ng/mL (ref 0.00–0.08)
Troponin i, poc: 0 ng/mL (ref 0.00–0.08)

## 2011-07-16 LAB — POCT I-STAT, CHEM 8
BUN: 10 mg/dL (ref 6–23)
Calcium, Ion: 1.26 mmol/L (ref 1.12–1.32)
Chloride: 102 mEq/L (ref 96–112)
Glucose, Bld: 138 mg/dL — ABNORMAL HIGH (ref 70–99)
HCT: 38 % (ref 36.0–46.0)
Potassium: 2.6 mEq/L — CL (ref 3.5–5.1)

## 2011-07-16 LAB — POTASSIUM: Potassium: 3 mEq/L — ABNORMAL LOW (ref 3.5–5.1)

## 2011-07-16 LAB — CBC
HCT: 37.8 % (ref 36.0–46.0)
Hemoglobin: 13.6 g/dL (ref 12.0–15.0)
MCV: 95 fL (ref 78.0–100.0)
RBC: 3.98 MIL/uL (ref 3.87–5.11)
WBC: 9.7 10*3/uL (ref 4.0–10.5)

## 2011-07-16 LAB — DIFFERENTIAL
Eosinophils Relative: 1 % (ref 0–5)
Lymphocytes Relative: 15 % (ref 12–46)
Lymphs Abs: 1.5 10*3/uL (ref 0.7–4.0)
Monocytes Absolute: 1.3 10*3/uL — ABNORMAL HIGH (ref 0.1–1.0)
Neutro Abs: 6.8 10*3/uL (ref 1.7–7.7)

## 2011-07-16 LAB — D-DIMER, QUANTITATIVE: D-Dimer, Quant: 0.33 ug/mL-FEU (ref 0.00–0.48)

## 2011-07-16 MED ORDER — NITROGLYCERIN 0.4 MG SL SUBL
SUBLINGUAL_TABLET | SUBLINGUAL | Status: AC
  Filled 2011-07-16: qty 25

## 2011-07-16 MED ORDER — METOPROLOL TARTRATE 25 MG PO TABS: 50.0000 mg | ORAL_TABLET | Freq: Once | ORAL | Status: DC

## 2011-07-16 MED ORDER — NITROGLYCERIN 0.4 MG SL SUBL
0.4000 mg | SUBLINGUAL_TABLET | Freq: Once | SUBLINGUAL | Status: AC
  Administered 2011-07-16: 0.4 mg via SUBLINGUAL

## 2011-07-16 MED ORDER — METOPROLOL TARTRATE 1 MG/ML IV SOLN
5.0000 mg | Freq: Once | INTRAVENOUS | Status: AC
  Administered 2011-07-16: 5 mg via INTRAVENOUS

## 2011-07-16 MED ORDER — IOHEXOL 350 MG/ML SOLN: 80.0000 mL | Freq: Once | INTRAVENOUS | Status: DC | PRN

## 2011-07-16 MED ORDER — METOPROLOL TARTRATE 25 MG PO TABS
50.0000 mg | ORAL_TABLET | Freq: Once | ORAL | Status: AC
  Administered 2011-07-16: 50 mg via ORAL
  Filled 2011-07-16: qty 2

## 2011-07-16 MED ORDER — METOPROLOL TARTRATE 1 MG/ML IV SOLN
INTRAVENOUS | Status: AC
  Filled 2011-07-16: qty 10

## 2011-07-16 MED ORDER — NITROGLYCERIN 0.4 MG SL SUBL: 0.4000 mg | SUBLINGUAL_TABLET | SUBLINGUAL | Status: DC | PRN

## 2011-07-16 MED ORDER — POTASSIUM CHLORIDE CRYS ER 20 MEQ PO TBCR
40.0000 meq | EXTENDED_RELEASE_TABLET | Freq: Once | ORAL | Status: AC
  Administered 2011-07-16: 40 meq via ORAL
  Filled 2011-07-16: qty 2

## 2011-07-16 MED ORDER — METOPROLOL TARTRATE 25 MG PO TABS
100.0000 mg | ORAL_TABLET | Freq: Once | ORAL | Status: AC
  Administered 2011-07-16: 100 mg via ORAL
  Filled 2011-07-16: qty 1

## 2011-07-16 MED ORDER — SODIUM CHLORIDE 0.9 % IV SOLN
INTRAVENOUS | Status: DC
  Administered 2011-07-16: 02:00:00 via INTRAVENOUS

## 2011-07-16 NOTE — ED Notes (Signed)
Spoke with dr Amil Amen about pt hr. meds ordered.

## 2011-07-16 NOTE — ED Notes (Signed)
States she began having chest pain at 1900. Sharp pains with radiation to neck and right side. Denies any pain at this time.

## 2011-07-16 NOTE — ED Notes (Signed)
Spoke with dr Amil Amen about pt study and delay in getting result. He will call result to  Pa now

## 2011-07-16 NOTE — Discharge Instructions (Signed)
It is very important establish close followup with her primary care physician and cardiologist for ongoing evaluation and management of your newly diagnosed coronary artery disease. Primary care provider and cardiologist will continue to manage any risk for future cardiac disease however return to emergency department for any changing or worsening symptoms.  Coronary Artery Disease Coronary artery disease (CAD) happens when the arteries of the heart become narrow and hardened. Buildup of plaque in the walls of the vessels blocks blood flow. This can cause minor chest pain (angina) if the blockage is partial. A heart attack or MI (myocardial infarction) happens when the artery is completely blocked. MIs can lead to shock, heart failure, and sudden death. CAD is the leading cause of death in men and women.  CAUSES  The risk for getting CAD can be inherited. There are other risk factors that can be helped. These factors include:  Cigarette smoking.   Diabetes.   Cholesterol.   High blood pressure.  Being overweight and not exercising regularly also increase your risk for having CAD.  SYMPTOMS  Chest pain.   Shortness of breath.   Weakness.   Nausea.   Fainting.  DIAGNOSIS  The diagnosis may require:  An EKG.   Stress test.   Chest X-ray.   Cardiac scan.   Echocardiogram.   A coronary angiogram.  TREATMENT  Treatment includes immediate measures for symptom relief. Medicines for chest pain, cholesterol reduction, blood pressure control, and aspirin to prevent clotting may be needed. Coronary angioplasty (using a balloon to open a blockage in a coronary artery) is helpful in managing MIs. It is also useful for some patients with angina. Losing weight, controlling the blood sugar, and not smoking are also important to long-term success. The optimal diet should emphasize fruits and vegetables. Sodas, deep-fried foods, and sweets should be avoided.  SEEK IMMEDIATE MEDICAL CARE  IF:  You develop severe chest pain or pain that does not improve with rest and medicine.   You develop pain that radiates into the arms, neck, jaw, or back.   You develop fainting, shortness of breath, severe palpitations, or vomiting.   You start sweating a lot.  Document Released: 04/22/2004 Document Revised: 03/04/2011 Document Reviewed: 03/13/2008 [facility] Patient Information 2012 [place], [place].

## 2011-07-16 NOTE — ED Notes (Signed)
MD at bedside to discuss plan of care

## 2011-07-16 NOTE — ED Notes (Signed)
Patient transported to CT 

## 2011-07-16 NOTE — ED Notes (Signed)
Chest pain that started 1.5 hours ago while at rest. Radiates to middle back. Hyperventilating/anxious on arrival by EMS, voices increased stress today. Took ASA 81 mg x4 taken PTA. ST on the monitor. IV established prior to arrival. Pt is now pain free. VSS, EKG unremarkable. Hx HTN.

## 2011-07-16 NOTE — ED Notes (Signed)
Addendum to triage. Pt said she had episode of diaphroesis, nausea and clear emesis associated with her chest pain at home

## 2011-07-16 NOTE — ED Provider Notes (Addendum)
Sign out given by DR. Jacubowitz. Patient is placed on CPP. Resting comfortably in bed without complaint stating last episode of CP at 11:30 last night. Patient is followed by Dr. Rene Paci as PCP. VSS. dispo pending.   Lenon Oms [place], [place] 07/16/11 1610  Dr. Ty Hilts reports 25-50% stenosis of proximal LAD but non occlusive with a calcium score of 42. Patient has been chest pain-free throughout the day today with negative serial troponin. Patient has an established primary care provider and was given cardiology followup. Patient is agreeable to following up with primary care and cardiology but to return to emergency department for any changing or worsening symptoms.  Jenness [place], [place] 07/16/11 1252

## 2011-07-16 NOTE — ED Notes (Signed)
BMI 25.6

## 2011-07-16 NOTE — ED Provider Notes (Signed)
Medical screening examination/treatment/procedure(s) were conducted as a shared visit with non-physician practitioner(s) and myself.  I personally evaluated the patient during the encounter  Doug Sou, MD 07/16/11 [phone]

## 2011-07-16 NOTE — Progress Notes (Signed)
Observation review is complete. 

## 2011-07-16 NOTE — ED Notes (Signed)
Chem 8 results given to Dr. Ethelda Chick by B. Bing Plume, EMT

## 2011-07-16 NOTE — ED Provider Notes (Signed)
History     CSN: [id number]  Arrival date & time 07/16/11  Rich Fuchs   First MD Initiated Contact with Patient 07/16/11 0030      Chief Complaint  Patient presents with  . Chest Pain    (Consider location/radiation/quality/duration/timing/severity/associated sxs/prior treatment) HPI  Complains of anterior chest pain nonradiating feels like hiatal hernia she's had in the past. Onset 7 PM  tonight. Associated symptoms include mild shortness of breath no nausea no sweatiness. Patient treated by EMS with aspirin discomfort is mild at present. Nonradiating she treated herself with 6 Thomas tablets and Nexium without relief  Past Medical History  Diagnosis Date  . Hereditary hemochromatosis 2004  . ASTHMA   . ALLERGIC RHINITIS   . HYPERTENSION   . GERD   . PONV (postoperative nausea and vomiting)     and difficulty waking up   . Dysrhythmia     pt reports " heart beats fast "   . Chronic kidney disease   . Arthritis     left shoulder   . Headache     sinus headaches   . H/O hiatal hernia   . Anxiety     Past Surgical History  Procedure Date  . Cholecystectomy 03/29/09  . Appendectomy 03/29/81  . Abdominal hysterectomy 03/29/81  . Bladder tack 03/29/2002  . Other surgical history     cyst removed from intestines to ovary    . Other surgical history     tendon surgery left wrist     Family History  Problem Relation Age of Onset  . Breast cancer Mother   . Hypertension Mother   . Heart disease Mother   . Cancer Mother 27    breast cancer  . Diabetes Father   . Heart disease Father   . Alcohol abuse Other   . Arthritis Other   . Cirrhosis Brother   . Cirrhosis Brother   . Cirrhosis Brother     History  Substance Use Topics  . Smoking status: Current Everyday Smoker -- 0.5 packs/day for 50 years    Types: Cigarettes  . Smokeless tobacco: Never Used   Comment: widowed summer 2012, lives alone. Retired from work in [employer]  . Alcohol Use: No    OB History    Grav Para Term Preterm Abortions TAB SAB Ect Mult Living                  Review of Systems  Constitutional: Negative.   HENT: Negative.   Respiratory: Positive for shortness of breath.   Cardiovascular: Positive for chest pain.  Gastrointestinal: Negative.   Musculoskeletal: Negative.   Skin: Negative.   Neurological: Negative.   Hematological: Negative.   Psychiatric/Behavioral: Negative.   All other systems reviewed and are negative.    Allergies  Ciprofloxacin; Codeine; and Morphine and related  Home Medications   Current Outpatient Rx  Name Route Sig Dispense Refill  . DIAZEPAM 5 MG PO TABS Oral Take 1 tablet (5 mg total) by mouth every 12 (twelve) hours as needed for anxiety. 60 tablet 1  . FEXOFENADINE HCL 180 MG PO TABS Oral Take 1 tablet (180 mg total) by mouth daily. 30 tablet 3  . HYDROCHLOROTHIAZIDE 12.5 MG PO TABS Oral Take 12.5 mg by mouth daily.    Marland Kitchen KLOR-CON M10 10 MEQ PO TBCR  TAKE 1 TABLET BY MOUTH EVERY DAY 30 tablet 5  . METHOCARBAMOL 500 MG PO TABS Oral Take 500 mg by mouth every 8 (eight) hours  as needed.    Marland Kitchen PROPRANOLOL HCL ER 60 MG PO CP24 Oral Take 1 capsule (60 mg total) by mouth daily. 30 capsule 6  . RANITIDINE HCL 150 MG PO CAPS Oral Take 150 mg by mouth daily.       BP 128/94  Pulse 105  Temp 98.8 F (37.1 C)  Resp 16  SpO2 92%  Physical Exam  Constitutional: She appears well-developed and well-nourished.  HENT:  Head: Normocephalic and atraumatic.  Eyes: Conjunctivae are normal. Pupils are equal, round, and reactive to light.  Neck: Neck supple. No tracheal deviation present. No thyromegaly present.  Cardiovascular: Regular rhythm.   No murmur heard.      Mildly tachycardic  Pulmonary/Chest: Effort normal and breath sounds normal.  Abdominal: Soft. Bowel sounds are normal. She exhibits no distension. There is no tenderness.  Musculoskeletal: Normal range of motion. She exhibits no edema and no tenderness.  Neurological: She is  alert. Coordination normal.  Skin: Skin is warm and dry. No rash noted.  Psychiatric: She has a normal mood and affect.    Date: 07/16/2011  Rate: 102  Rhythm: sinus tachycardia  QRS Axis: normal  Intervals: normal  ST/T Wave abnormalities: normal  Conduction Disutrbances:none  Narrative Interpretation:   Old EKG Reviewed: changes noted Nonspecific ST-T wave changes new, possibly consistent with hypokalemia over prior tracing from 04/02/2011. Tracing from 04/12/2011 showed normal sinus rhythm 85 beats per minute  ED Course  Procedures (including critical care time) To 30 5 AM patient asymptomatic pain-free without any treatment  Labs Reviewed  CBC  DIFFERENTIAL   Results for orders placed during the hospital encounter of 07/16/11  CBC      Component Value Range   WBC 9.7  4.0 - 10.5 (K/uL)   RBC 3.98  3.87 - 5.11 (MIL/uL)   Hemoglobin 13.6  12.0 - 15.0 (g/dL)   HCT 16.1  09.6 - 04.5 (%)   MCV 95.0  78.0 - 100.0 (fL)   MCH 34.2 (*) 26.0 - 34.0 (pg)   MCHC 36.0  30.0 - 36.0 (g/dL)   RDW 40.9  81.1 - 91.4 (%)   Platelets 236  150 - 400 (K/uL)  DIFFERENTIAL      Component Value Range   Neutrophils Relative 71  43 - 77 (%)   Neutro Abs 6.8  1.7 - 7.7 (K/uL)   Lymphocytes Relative 15  12 - 46 (%)   Lymphs Abs 1.5  0.7 - 4.0 (K/uL)   Monocytes Relative 13 (*) 3 - 12 (%)   Monocytes Absolute 1.3 (*) 0.1 - 1.0 (K/uL)   Eosinophils Relative 1  0 - 5 (%)   Eosinophils Absolute 0.1  0.0 - 0.7 (K/uL)   Basophils Relative 0  0 - 1 (%)   Basophils Absolute 0.0  0.0 - 0.1 (K/uL)  D-DIMER, QUANTITATIVE      Component Value Range   D-Dimer, Quant 0.33  0.00 - 0.48 (ug/mL-FEU)  POCT I-STAT, CHEM 8      Component Value Range   Sodium 141  135 - 145 (mEq/L)   Potassium 2.6 (*) 3.5 - 5.1 (mEq/L)   Chloride 102  96 - 112 (mEq/L)   BUN 10  6 - 23 (mg/dL)   Creatinine, Ser 7.82  0.50 - 1.10 (mg/dL)   Glucose, Bld 956 (*) 70 - 99 (mg/dL)   Calcium, Ion 2.13  0.86 - 1.32 (mmol/L)    TCO2 27  0 - 100 (mmol/L)   Hemoglobin 12.9  12.0 - 15.0 (g/dL)   HCT 11.9  14.7 - 82.9 (%)   Comment NOTIFIED PHYSICIAN    POCT I-STAT TROPONIN I      Component Value Range   Troponin i, poc 0.00  0.00 - 0.08 (ng/mL)   Comment 3            Dg Chest Port 1 View  07/16/2011  *RADIOLOGY REPORT*  Clinical Data: Chest pain shortness of breath.  PORTABLE CHEST - 1 VIEW  Comparison: 04/02/2011  Findings: Cardiac leads project over the chest. Linear atelectasis or scarring is present at the medial left lung base.  Otherwise, the lungs are clear.  No pleural effusion or pneumothorax is visualized.  IMPRESSION: Linear atelectasis or scar at the medial left lung base. Otherwise, no acute findings.  Original Report Authenticated By: Britta Mccreedy, M.D.    No results found.   No diagnosis found.    MDM  Plan CCU protocol, for chest pain Patient with low pretest probability for acs Diagnosis #1 chest pain #2 hypokalemia        Doug Sou, MD 07/16/11 [phone]

## 2011-07-23 NOTE — ED Provider Notes (Signed)
Medical screening examination/treatment/procedure(s) were conducted as a shared visit with non-physician practitioner(s) and myself.  I personally evaluated the patient during the encounter  Doug Sou, MD 07/23/11 [phone]

## 2011-07-28 ENCOUNTER — Other Ambulatory Visit: Payer: Self-pay | Admitting: *Deleted

## 2011-07-28 DIAGNOSIS — I10 Essential (primary) hypertension: Secondary | ICD-10-CM

## 2011-07-28 MED ORDER — HYDROCHLOROTHIAZIDE 12.5 MG PO CAPS: 12.5000 mg | ORAL_CAPSULE | ORAL | Status: DC

## 2011-07-28 MED ORDER — PROPRANOLOL HCL ER 60 MG PO CP24: 60.0000 mg | ORAL_CAPSULE | Freq: Every day | ORAL | Status: DC

## 2011-07-28 MED ORDER — POTASSIUM CHLORIDE CRYS ER 10 MEQ PO TBCR: 10.0000 meq | EXTENDED_RELEASE_TABLET | Freq: Every day | ORAL | Status: DC

## 2011-08-04 ENCOUNTER — Ambulatory Visit (INDEPENDENT_AMBULATORY_CARE_PROVIDER_SITE_OTHER): Payer: Medicare Other | Admitting: Cardiology

## 2011-08-04 ENCOUNTER — Ambulatory Visit (INDEPENDENT_AMBULATORY_CARE_PROVIDER_SITE_OTHER): Payer: Medicare Other | Admitting: Internal Medicine

## 2011-08-04 ENCOUNTER — Other Ambulatory Visit (INDEPENDENT_AMBULATORY_CARE_PROVIDER_SITE_OTHER): Payer: Medicare Other

## 2011-08-04 ENCOUNTER — Encounter: Payer: Self-pay | Admitting: Cardiology

## 2011-08-04 ENCOUNTER — Encounter: Payer: Self-pay | Admitting: Internal Medicine

## 2011-08-04 VITALS — BP 130/88 | HR 95 | Temp 97.8°F | Ht 62.0 in | Wt 141.4 lb

## 2011-08-04 VITALS — BP 120/80 | HR 94 | Wt 141.0 lb

## 2011-08-04 DIAGNOSIS — F17200 Nicotine dependence, unspecified, uncomplicated: Secondary | ICD-10-CM

## 2011-08-04 DIAGNOSIS — E785 Hyperlipidemia, unspecified: Secondary | ICD-10-CM

## 2011-08-04 DIAGNOSIS — I10 Essential (primary) hypertension: Secondary | ICD-10-CM

## 2011-08-04 DIAGNOSIS — R072 Precordial pain: Secondary | ICD-10-CM

## 2011-08-04 DIAGNOSIS — E7800 Pure hypercholesterolemia, unspecified: Secondary | ICD-10-CM

## 2011-08-04 DIAGNOSIS — Z87891 Personal history of nicotine dependence: Secondary | ICD-10-CM | POA: Insufficient documentation

## 2011-08-04 DIAGNOSIS — R0789 Other chest pain: Secondary | ICD-10-CM

## 2011-08-04 DIAGNOSIS — I2510 Atherosclerotic heart disease of native coronary artery without angina pectoris: Secondary | ICD-10-CM | POA: Insufficient documentation

## 2011-08-04 DIAGNOSIS — R079 Chest pain, unspecified: Secondary | ICD-10-CM

## 2011-08-04 DIAGNOSIS — E876 Hypokalemia: Secondary | ICD-10-CM

## 2011-08-04 DIAGNOSIS — Z720 Tobacco use: Secondary | ICD-10-CM

## 2011-08-04 LAB — POTASSIUM: Potassium: 3.5 mEq/L (ref 3.5–5.1)

## 2011-08-04 MED ORDER — PRAVASTATIN SODIUM 40 MG PO TABS: 40.0000 mg | ORAL_TABLET | Freq: Every evening | ORAL | Status: DC

## 2011-08-04 MED ORDER — CYCLOBENZAPRINE HCL 5 MG PO TABS: 5.0000 mg | ORAL_TABLET | Freq: Three times a day (TID) | ORAL | Status: AC | PRN

## 2011-08-04 NOTE — Assessment & Plan Note (Signed)
Managed by hematology oncology. Check echocardiogram to rule out cardiac involvement and evaluate LV function.

## 2011-08-04 NOTE — Patient Instructions (Addendum)
It was good to see you today. We have reviewed your prior records including labs and tests today Test(s) ordered today tor recheck potassium. Your results will be called to you after review (48-72hours after test completion). If any changes need to be made, you will be notified at that time. Start Inderal (propanolol) every AM to help control palpitations, blood pressure and tremor symptoms  Use flexeril, not robaxin, as needed for muscle spasms follow up with cardiology as planned Consider giving up those cigarettes as we discussed - let me know how i can help you when you are ready! Will check cholesterol next visit so come in fasting (nothing to eat for 8 hours before visit) Please schedule followup in 3 months, call sooner if problems.

## 2011-08-04 NOTE — Assessment & Plan Note (Signed)
on hctz - misunderstanding 04/2011 re: inderal  resume daily low dose inderal to help palpitations and tremor symptoms  follow up with cards as planned  BP Readings from Last 3 Encounters:  08/04/11 130/88  07/16/11 111/60  06/11/11 125/76

## 2011-08-04 NOTE — Assessment & Plan Note (Signed)
Add Pravachol 40 mg daily. Check lipids and liver in 6 weeks. 

## 2011-08-04 NOTE — Assessment & Plan Note (Signed)
Patient has a 25-50% LAD lesion noted on cardiac CT. We will plan medical therapy. Add enteric-coated aspirin 81 mg daily. Add Pravachol 40 mg daily. Check lipids and liver in 6 weeks.

## 2011-08-04 NOTE — Progress Notes (Signed)
HPI: 66 year old female for evaluation of chest pain and coronary disease. Patient seen in the emergency room in April of 2013 with chest pain. Cardiac markers and d-dimer normal. Cardiac CTA in April of 2013 revealed: Normal LM, 25-50% LAD, nonocclusive plaque in the circumflex. Calcium score 42. Patient asked to followup with cardiology. On the day she was seen in the emergency room she developed chest pain. It was described as a sharp pain radiating to her back around her right side. It also radiated to her neck. It lasted 6 hours. There was mild nausea and shortness of breath but no diaphoresis. The pain was not pleuritic or positional. It resolved spontaneously. She has had no chest pain since. She denies dyspnea on exertion, orthopnea, PND, pedal edema, syncope.   Current Outpatient Prescriptions  Medication Sig Dispense Refill  . cyclobenzaprine (FLEXERIL) 5 MG tablet Take 1 tablet (5 mg total) by mouth every 8 (eight) hours as needed for muscle spasms.  30 tablet  1  . fexofenadine (ALLEGRA) 180 MG tablet Take 1 tablet (180 mg total) by mouth daily.  30 tablet  3  . hydrochlorothiazide (MICROZIDE) 12.5 MG capsule Take 1 capsule (12.5 mg total) by mouth every morning.  90 capsule  1  . potassium chloride (KLOR-CON M10) 10 MEQ tablet Take 1 tablet (10 mEq total) by mouth daily.  90 tablet  1  . propranolol ER (INDERAL LA) 60 MG 24 hr capsule Take 1 capsule (60 mg total) by mouth daily.  90 capsule  1  . ranitidine (ZANTAC) 150 MG capsule Take 150 mg by mouth daily.       Marland Kitchen DISCONTD: hydrochlorothiazide (HYDRODIURIL) 12.5 MG tablet Take 12.5 mg by mouth daily.      Marland Kitchen DISCONTD: potassium chloride (KLOR-CON) 10 MEQ CR tablet Take 1 tablet (10 mEq total) by mouth daily.  30 tablet  5    Allergies  Allergen Reactions  . Ciprofloxacin Hives    Blisters on legs  . Codeine Nausea And Vomiting    Sweating, "passes out"  . Morphine And Related     "hospital bed was shaking"    Past Medical  History  Diagnosis Date  . Hereditary hemochromatosis 2004  . ASTHMA   . HYPERTENSION   . GERD   . Chronic kidney disease   . Arthritis     left shoulder   . Headache     sinus headaches   . H/O hiatal hernia   . Anxiety   . Hyperlipidemia     Past Surgical History  Procedure Date  . Cholecystectomy 03/29/09  . Appendectomy 03/29/81  . Abdominal hysterectomy 03/29/81  . Bladder tack 03/29/2002  . Other surgical history     cyst removed from intestines to ovary    . Other surgical history     tendon surgery left wrist     History   Social History  . Marital Status: Widowed    Spouse Name: N/A    Number of Children: 2  . Years of Education: N/A   Occupational History  .      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    Social History Main Topics  . Smoking status: Current Everyday Smoker -- 0.5 packs/day for 50 years    Types: Cigarettes  . Smokeless tobacco: Never Used   Comment: widowed summer 2012, lives alone. Retired from work in [employer]  . Alcohol Use: No  . Drug Use: No  . Sexually  Active: No   Other Topics Concern  . Not on file   Social History Narrative  . No narrative on file    Family History  Problem Relation Age of Onset  . Breast cancer Mother   . Hypertension Mother   . Heart disease Mother   . Cancer Mother 81    breast cancer  . Diabetes Father   . Heart disease Father   . Alcohol abuse Other   . Arthritis Other   . Cirrhosis Brother   . Cirrhosis Brother   . Cirrhosis Brother     ROS:  no fevers or chills, productive cough, hemoptysis, dysphasia, odynophagia, melena, hematochezia, dysuria, hematuria, rash, seizure activity, orthopnea, PND, pedal edema, claudication. Remaining systems are negative.  Physical Exam:   Blood pressure 120/80, pulse 94, weight 63.957 kg (141 lb).  General:  Well developed/well nourished in NAD Skin warm/dry Patient not depressed No peripheral  clubbing Back-normal HEENT-normal/normal eyelids Neck supple/normal carotid upstroke bilaterally; no bruits; no JVD; no thyromegaly chest - CTA/ normal expansion CV - RRR/normal S1 and S2; no murmurs, rubs or gallops;  PMI nondisplaced Abdomen -NT/ND, no HSM, no mass, + bowel sounds, no bruit 2+ femoral pulses, no bruits Ext-no edema, chords, 2+ DP Neuro-grossly nonfocal  ECG 07/16/2011-sinus rhythm with PVC. No ST changes.

## 2011-08-04 NOTE — Patient Instructions (Signed)
Your physician wants you to follow-up in: 6 MONTHS WITH DR Jens Som You will receive a reminder letter in the mail two months in advance. If you don't receive a letter, please call our office to schedule the follow-up appointment.   START ASPIRIN 81 MG ONCE DAILY WITH FOOD  START PRAVACHOL 40 MG ONCE DAILY AT BEDTIME  Your physician recommends that you return for lab work in: 6 WEEKS=FASTING  Your physician has requested that you have an echocardiogram. Echocardiography is a painless test that uses sound waves to create images of your heart. It provides your doctor with information about the size and shape of your heart and how well your heart's chambers and valves are working. This procedure takes approximately one hour. There are no restrictions for this procedure.

## 2011-08-04 NOTE — Assessment & Plan Note (Signed)
Blood pressure controlled. Continue present medications. 

## 2011-08-04 NOTE — Assessment & Plan Note (Signed)
Patient counseled on discontinuing. 

## 2011-08-04 NOTE — Assessment & Plan Note (Signed)
Pt has declined statin therapy in past - Not fasting today Reviewed importqance of lipid control to minimize CAD risk - as well as BP control and smoking cessation

## 2011-08-04 NOTE — Progress Notes (Signed)
  Subjective:    Patient ID: [name] [name], female    DOB: 03-22-1946, 66 y.o.   MRN: [id number]  HPI  Here for ER follow up - seen 07/16/11 for chest pain Observation in CCU: neg tele, no acute EKG changes and normal serial enz Cardiac CT done: Dr. Ty Hilts reports 25-50% stenosis of proximal LAD but non occlusive with a calcium score of 42.   No recurrent chest pain since ?about meds from last OV - robaxin and inderal - took each once at same time - caused sedation. ?resume either  Past Medical History  Diagnosis Date  . Hereditary hemochromatosis 2004  . ASTHMA   . ALLERGIC RHINITIS   . HYPERTENSION   . GERD   . Chronic kidney disease   . Arthritis     left shoulder   . Headache     sinus headaches   . H/O hiatal hernia   . Anxiety      Review of Systems  Constitutional: Negative for fever and fatigue.  Respiratory: Negative for cough and shortness of breath.   Cardiovascular: Positive for chest pain and leg swelling. Negative for palpitations.  Gastrointestinal: Negative for abdominal pain.       Objective:   Physical Exam BP 130/88  Pulse 95  Temp(Src) 97.8 F (36.6 C) (Oral)  Ht 5\' 2"  (1.575 m)  Wt 141 lb 6.4 oz (64.139 kg)  BMI 25.86 kg/m2  SpO2 96% Constitutional: She is well-developed and well-nourished. No distress. Mild resting head/neck tremor Neck: Normal range of motion. Neck supple. No JVD or LAD present. No thyromegaly present.  Cardiovascular: Normal rate, regular rhythm and normal heart sounds.  No murmur heard. No BLE edema. Pulmonary/Chest: Effort normal and breath sounds normal. No respiratory distress. She has no wheezes.  Neuro: tremor head/neck without change Psychiatric: She has an appropriately depressed mood and affect, tearful. Her behavior is normal. Judgment and thought content normal  Lab Results  Component Value Date   WBC 9.7 07/16/2011   HGB 12.9 07/16/2011   HCT 38.0 07/16/2011   PLT 236 07/16/2011   GLUCOSE 138* 07/16/2011   CHOL 201 11/11/2009   HDL 34 11/11/2009   LDLCALC 103 11/11/2009   ALT 19 05/26/2011   AST 19 05/26/2011   NA 141 07/16/2011   K 3.0* 07/16/2011   CL 102 07/16/2011   CREATININE 0.80 07/16/2011   BUN 10 07/16/2011   CO2 28 04/02/2011   TSH 1.43 01/18/2011       Assessment & Plan:  See problem list. Medications and labs reviewed today.  chest pain - single episode with CCU eval 4/19 - no recurrence. Cardiac CT as above - follow up cards as planned today and continue risk factor stratification  Hypokalemia - recheck now - continue supplementation to counter HCTZ therapy

## 2011-08-11 ENCOUNTER — Other Ambulatory Visit: Payer: Self-pay

## 2011-08-11 ENCOUNTER — Ambulatory Visit (HOSPITAL_COMMUNITY): Payer: Medicare Other | Attending: Cardiology

## 2011-08-11 DIAGNOSIS — E785 Hyperlipidemia, unspecified: Secondary | ICD-10-CM | POA: Insufficient documentation

## 2011-08-11 DIAGNOSIS — R072 Precordial pain: Secondary | ICD-10-CM

## 2011-08-11 DIAGNOSIS — F17200 Nicotine dependence, unspecified, uncomplicated: Secondary | ICD-10-CM | POA: Insufficient documentation

## 2011-08-11 DIAGNOSIS — R079 Chest pain, unspecified: Secondary | ICD-10-CM | POA: Insufficient documentation

## 2011-08-11 DIAGNOSIS — I10 Essential (primary) hypertension: Secondary | ICD-10-CM | POA: Insufficient documentation

## 2011-09-10 ENCOUNTER — Other Ambulatory Visit (HOSPITAL_BASED_OUTPATIENT_CLINIC_OR_DEPARTMENT_OTHER): Payer: Medicare Other | Admitting: Lab

## 2011-09-10 LAB — CBC WITH DIFFERENTIAL/PLATELET
Basophils Absolute: 0 10*3/uL (ref 0.0–0.1)
EOS%: 3 % (ref 0.0–7.0)
Eosinophils Absolute: 0.2 10*3/uL (ref 0.0–0.5)
HGB: 13.6 g/dL (ref 11.6–15.9)
MONO%: 10.8 % (ref 0.0–14.0)
NEUT#: 4.6 10*3/uL (ref 1.5–6.5)
RBC: 4.04 10*6/uL (ref 3.70–5.45)
RDW: 13 % (ref 11.2–14.5)
lymph#: 1.9 10*3/uL (ref 0.9–3.3)

## 2011-09-10 LAB — FERRITIN: Ferritin: 28 ng/mL (ref 10–291)

## 2011-09-16 ENCOUNTER — Other Ambulatory Visit: Payer: Medicare Other

## 2011-09-17 ENCOUNTER — Other Ambulatory Visit (INDEPENDENT_AMBULATORY_CARE_PROVIDER_SITE_OTHER): Payer: Medicare Other

## 2011-09-17 DIAGNOSIS — E7800 Pure hypercholesterolemia, unspecified: Secondary | ICD-10-CM

## 2011-09-17 LAB — LIPID PANEL
Cholesterol: 175 mg/dL (ref 0–200)
LDL Cholesterol: 102 mg/dL — ABNORMAL HIGH (ref 0–99)
Total CHOL/HDL Ratio: 4
Triglycerides: 132 mg/dL (ref 0.0–149.0)
VLDL: 26.4 mg/dL (ref 0.0–40.0)

## 2011-09-17 LAB — HEPATIC FUNCTION PANEL
ALT: 17 U/L (ref 0–35)
AST: 19 U/L (ref 0–37)
Bilirubin, Direct: 0 mg/dL (ref 0.0–0.3)
Total Bilirubin: 0.3 mg/dL (ref 0.3–1.2)

## 2011-09-21 ENCOUNTER — Telehealth: Payer: Self-pay | Admitting: *Deleted

## 2011-09-21 DIAGNOSIS — E7800 Pure hypercholesterolemia, unspecified: Secondary | ICD-10-CM

## 2011-09-21 NOTE — Telephone Encounter (Signed)
Spoke with pt, she is aware of lab results. She reports that she stopped her pravastatin and she also stopped the labetalol dr Felicity Coyer started her on. She reports that after she started taking both of those meds she was itching all the time and had blisters. She also states she was tired all the time. Since she has stopped both meds her symptoms are gone. The pt reports no further palpitations and her heart rate has been normal at home. Pravastatin is the only statin she has ever taken, she is willing to try another. Will discuss with dr Jens Som

## 2011-09-28 NOTE — Telephone Encounter (Signed)
Spoke with pt, aware dr Jens Som would like her to try crestor 10 mg. Samples placed at the front desk for pick up. She will have labs checked in 6 weeks

## 2011-09-28 NOTE — Telephone Encounter (Signed)
Fu call °Pt returning your call  °

## 2011-11-03 ENCOUNTER — Ambulatory Visit (INDEPENDENT_AMBULATORY_CARE_PROVIDER_SITE_OTHER): Payer: Medicare Other | Admitting: Internal Medicine

## 2011-11-03 ENCOUNTER — Encounter: Payer: Self-pay | Admitting: Internal Medicine

## 2011-11-03 VITALS — BP 120/82 | HR 91 | Temp 98.4°F | Ht 62.0 in | Wt 147.8 lb

## 2011-11-03 DIAGNOSIS — E785 Hyperlipidemia, unspecified: Secondary | ICD-10-CM

## 2011-11-03 DIAGNOSIS — M62838 Other muscle spasm: Secondary | ICD-10-CM | POA: Insufficient documentation

## 2011-11-03 DIAGNOSIS — I10 Essential (primary) hypertension: Secondary | ICD-10-CM

## 2011-11-03 DIAGNOSIS — J309 Allergic rhinitis, unspecified: Secondary | ICD-10-CM

## 2011-11-03 MED ORDER — METHOCARBAMOL 500 MG PO TABS: 500.0000 mg | ORAL_TABLET | Freq: Three times a day (TID) | ORAL | Status: DC | PRN

## 2011-11-03 MED ORDER — IBUPROFEN 800 MG PO TABS: 800.0000 mg | ORAL_TABLET | Freq: Three times a day (TID) | ORAL | Status: AC | PRN

## 2011-11-03 MED ORDER — METHYLPREDNISOLONE ACETATE 80 MG/ML IJ SUSP
120.0000 mg | Freq: Once | INTRAMUSCULAR | Status: AC
  Administered 2011-11-03: 120 mg via INTRAMUSCULAR

## 2011-11-03 MED ORDER — FLUTICASONE PROPIONATE 50 MCG/ACT NA SUSP: 2.0000 | Freq: Every day | NASAL | Status: DC

## 2011-11-03 MED ORDER — DIAZEPAM 5 MG PO TABS: 5.0000 mg | ORAL_TABLET | Freq: Two times a day (BID) | ORAL | Status: DC | PRN

## 2011-11-03 NOTE — Assessment & Plan Note (Signed)
Pt has declined statin therapy in past - but started 06/2011 after chest pain incident Intol of prava due to itch - on 6 week trial crestor  Since 08/2011 per cards and doing well Not fasting today but planning recehck with cards soon Reviewed importqance of lipid control to minimize CAD risk - as well as BP control and smoking cessation

## 2011-11-03 NOTE — Progress Notes (Signed)
  Subjective:    Patient ID: [name] [name], female    DOB: 09/22/1945, 66 y.o.   MRN: [id number]  HPI  Here for follow up  ?about meds from last OV - robaxin changed to flexeril - but when taken with inderal, caused sedation. ?resume either  Ongoing allergy flare with L ear "pressure" - >medrol shot? Does well with same  Also refill valium for nerves - still sad about death of spouse last year  Past Medical History  Diagnosis Date  . Hereditary hemochromatosis 2004  . ASTHMA   . HYPERTENSION   . GERD   . Chronic kidney disease   . Arthritis     left shoulder   . Headache     sinus headaches   . H/O hiatal hernia   . Anxiety   . Hyperlipidemia      Review of Systems  Constitutional: Negative for fever and fatigue.  HENT: Positive for sneezing, neck pain (chronic), postnasal drip and sinus pressure.   Eyes: Positive for itching.  Respiratory: Negative for cough and shortness of breath.   Cardiovascular: Negative for chest pain, palpitations and leg swelling.  Gastrointestinal: Negative for abdominal pain.       Objective:   Physical Exam  BP 120/82  Pulse 91  Temp 98.4 F (36.9 C) (Oral)  Ht 5\' 2"  (1.575 m)  Wt 147 lb 12.8 oz (67.042 kg)  BMI 27.03 kg/m2  SpO2 94% Wt Readings from Last 3 Encounters:  11/03/11 147 lb 12.8 oz (67.042 kg)  08/04/11 141 lb (63.957 kg)  08/04/11 141 lb 6.4 oz (64.139 kg)   Constitutional: She is well-developed and well-nourished. No distress. Mod resting head/neck tremor HENT: nontender sinus, TMs hazy but no effusion or erythema - OP red but no exudate, PND clear Neck: Normal range of motion. Neck supple. No JVD or LAD present. No thyromegaly present.  Cardiovascular: Normal rate, regular rhythm and normal heart sounds.  No murmur heard. No BLE edema. Pulmonary/Chest: Effort normal and breath sounds normal. No respiratory distress. She has no wheezes.  Neuro: tremor head/neck without change Psychiatric: She has a dysphoric  mood and affect, occassionally tearful recalling death of Roanna Epley (spouse). Her behavior is normal. Judgment and thought content normal  Lab Results  Component Value Date   WBC 7.5 09/10/2011   HGB 13.6 09/10/2011   HCT 39.0 09/10/2011   PLT 214 09/10/2011   GLUCOSE 138* 07/16/2011   CHOL 175 09/17/2011   TRIG 132.0 09/17/2011   HDL 46.90 09/17/2011   LDLCALC 102* 09/17/2011   ALT 17 09/17/2011   AST 19 09/17/2011   NA 141 07/16/2011   K 3.5 08/04/2011   CL 102 07/16/2011   CREATININE 0.80 07/16/2011   BUN 10 07/16/2011   CO2 28 04/02/2011   TSH 1.43 01/18/2011       Assessment & Plan:  See problem list. Medications and labs reviewed today.  chest pain - single episode with CCU eval 07/16/11 - no recurrence. Continue risk factor stratification/mgmt as ongoing

## 2011-11-03 NOTE — Assessment & Plan Note (Signed)
on hctz - reviewed misunderstanding 04/2011 and 08/2011 re: inderal  Encouraged to begin daily low dose inderal to help palpitations and tremor symptoms  Continue to follow up with cards   BP Readings from Last 3 Encounters:  11/03/11 120/82  08/04/11 120/80  08/04/11 130/88

## 2011-11-03 NOTE — Assessment & Plan Note (Signed)
Flare ongoing -  Requests steroid IM for tx same - responded well in past Also reminded to use Flonase daily with allegra daily and quit smoking!

## 2011-11-03 NOTE — Assessment & Plan Note (Signed)
Chronic - uses ibuprofen for same prn Change muscle relaxer back to robaxin as flexeril causes sedation -

## 2011-11-03 NOTE — Patient Instructions (Signed)
It was good to see you today. We have reviewed your prior records including labs and tests today Medications reviewed and updated - continue Inderal as prescribed, Use flonase spray daily and change flexeril back to robaxin for muscle spasm pain Ok for ibuprofen 800 as needed Your prescription(s) have been submitted to your pharmacy. Please take as directed and contact our office if you believe you are having problem(s) with the medication(s). Steroid shot given for allergy flare today Check labs with Dr Jens Som as planned Please schedule followup in 4 months, call sooner if problems.

## 2011-11-10 ENCOUNTER — Other Ambulatory Visit (INDEPENDENT_AMBULATORY_CARE_PROVIDER_SITE_OTHER): Payer: Medicare Other

## 2011-11-10 DIAGNOSIS — E7800 Pure hypercholesterolemia, unspecified: Secondary | ICD-10-CM

## 2011-11-10 LAB — HEPATIC FUNCTION PANEL
ALT: 15 U/L (ref 0–35)
Bilirubin, Direct: 0.1 mg/dL (ref 0.0–0.3)
Total Bilirubin: 0.5 mg/dL (ref 0.3–1.2)

## 2011-11-10 LAB — LIPID PANEL
HDL: 48.6 mg/dL (ref >39.00)
LDL Cholesterol: 58 mg/dL (ref 0–99)
Total CHOL/HDL Ratio: 3
VLDL: 19.6 mg/dL (ref 0.0–40.0)

## 2011-11-12 ENCOUNTER — Telehealth: Payer: Self-pay | Admitting: *Deleted

## 2011-11-12 DIAGNOSIS — E7800 Pure hypercholesterolemia, unspecified: Secondary | ICD-10-CM

## 2011-11-12 MED ORDER — ROSUVASTATIN CALCIUM 10 MG PO TABS: 10.0000 mg | ORAL_TABLET | Freq: Every day | ORAL | Status: DC

## 2011-11-12 NOTE — Telephone Encounter (Signed)
Message copied by Burnell Blanks on Fri Nov 12, 2011  1:03 PM ------      Message from: Lewayne Bunting      Created: Wed Nov 10, 2011  4:52 PM       Leonidas Romberg [place]

## 2011-11-12 NOTE — Telephone Encounter (Signed)
Patient stated she didn't know if she would be able to afford Crestor.  Gathered 4 weeks of samples for her and put at front desk.  Advised her to check with pharmacy and see what cost was and if unable to afford to call back and would discuss with Dr Jens Som

## 2011-11-12 NOTE — Telephone Encounter (Signed)
Advised patient of lab results and sent Rx for Crestor to [facility].

## 2011-11-17 ENCOUNTER — Ambulatory Visit: Payer: Medicare Other | Admitting: Internal Medicine

## 2011-12-10 ENCOUNTER — Other Ambulatory Visit (HOSPITAL_BASED_OUTPATIENT_CLINIC_OR_DEPARTMENT_OTHER): Payer: Medicare Other | Admitting: Lab

## 2011-12-10 LAB — COMPREHENSIVE METABOLIC PANEL (CC13)
CO2: 26 mEq/L (ref 22–29)
Calcium: 9.3 mg/dL (ref 8.4–10.4)
Creatinine: 0.8 mg/dL (ref 0.6–1.1)
Glucose: 113 mg/dl — ABNORMAL HIGH (ref 70–99)
Sodium: 141 mEq/L (ref 136–145)
Total Bilirubin: 0.4 mg/dL (ref 0.20–1.20)
Total Protein: 6.6 g/dL (ref 6.4–8.3)

## 2011-12-10 LAB — CBC WITH DIFFERENTIAL/PLATELET
Basophils Absolute: 0 10*3/uL (ref 0.0–0.1)
Eosinophils Absolute: 0.1 10*3/uL (ref 0.0–0.5)
HGB: 13.5 g/dL (ref 11.6–15.9)
LYMPH%: 21.8 % (ref 14.0–49.7)
MCV: 97 fL (ref 79.5–101.0)
MONO#: 0.7 10*3/uL (ref 0.1–0.9)
MONO%: 7.6 % (ref 0.0–14.0)
NEUT#: 6.4 10*3/uL (ref 1.5–6.5)
Platelets: 187 10*3/uL (ref 145–400)
RBC: 3.92 10*6/uL (ref 3.70–5.45)
RDW: 12.7 % (ref 11.2–14.5)
WBC: 9.3 10*3/uL (ref 3.9–10.3)

## 2011-12-11 ENCOUNTER — Other Ambulatory Visit: Payer: Self-pay | Admitting: Oncology

## 2011-12-13 ENCOUNTER — Telehealth: Payer: Self-pay | Admitting: Internal Medicine

## 2011-12-13 DIAGNOSIS — E876 Hypokalemia: Secondary | ICD-10-CM

## 2011-12-13 NOTE — Telephone Encounter (Signed)
Caller: [name] [name]/Patient; Phone: [phone]; Reason for Call: Patient calling regarding blood work she had done on Friday at the [facility].  Patient was told her potassium was really low.  Told her they would fax over to Dr.  Felicity Coyer and for patient to get in touch with her.  In the meantime told patient to take 3 a day of the potassium pills til she speak with Dr.  Felicity Coyer.  Also states this morning states has felt her heart flutter, but not right now.  Please call her back.  Thanks

## 2011-12-13 NOTE — Telephone Encounter (Signed)
Pt informed of MD's advisement and to come in on Wednesday to have potassium level drawn. Pt verbalized understanding.

## 2011-12-13 NOTE — Telephone Encounter (Signed)
I did receive copy of labs from Dr. Gaylyn Rong - patient should continue potassium 3 times daily as instructed and come to this office for lab only visit on Wednesday  9/18 to recheck potassium.  I have entered potassium order into EMR - We will call after review of that lab to see if any other medication changes are needed No other treatment changes recommended at this time

## 2011-12-15 ENCOUNTER — Other Ambulatory Visit (INDEPENDENT_AMBULATORY_CARE_PROVIDER_SITE_OTHER): Payer: Medicare Other

## 2011-12-15 DIAGNOSIS — E876 Hypokalemia: Secondary | ICD-10-CM

## 2011-12-15 LAB — POTASSIUM: Potassium: 3.8 mEq/L (ref 3.5–5.1)

## 2011-12-29 ENCOUNTER — Telehealth: Payer: Self-pay | Admitting: *Deleted

## 2011-12-29 ENCOUNTER — Other Ambulatory Visit: Payer: Self-pay | Admitting: Cardiology

## 2011-12-29 DIAGNOSIS — E7800 Pure hypercholesterolemia, unspecified: Secondary | ICD-10-CM

## 2011-12-29 NOTE — Telephone Encounter (Signed)
Left message for pt to call.

## 2011-12-29 NOTE — Telephone Encounter (Signed)
lipitor 20 mg daily; lipids and liver in six weeks Brian Crenshaw  

## 2011-12-29 NOTE — Telephone Encounter (Signed)
New problem:  Need an alternative medication other than crestor due to cost.  Was given sample when she was out. Out of medication now.

## 2011-12-29 NOTE — Telephone Encounter (Signed)
Pt is unable to take crestor due to the cost. She has tried pravachol but had to stop due to itching. She wants to know what to try next? Will forward for dr Jens Som review

## 2012-01-13 NOTE — Telephone Encounter (Signed)
Spoke with pt, she does not want to start the lipitor now. She has a follow up appt with dr Jens Som and will discuss with him at that time

## 2012-01-28 ENCOUNTER — Encounter: Payer: Self-pay | Admitting: Internal Medicine

## 2012-01-28 ENCOUNTER — Ambulatory Visit (INDEPENDENT_AMBULATORY_CARE_PROVIDER_SITE_OTHER): Payer: Medicare Other | Admitting: Internal Medicine

## 2012-01-28 VITALS — BP 132/88 | HR 100 | Temp 98.1°F | Ht 62.0 in | Wt 147.4 lb

## 2012-01-28 DIAGNOSIS — R51 Headache: Secondary | ICD-10-CM

## 2012-01-28 DIAGNOSIS — Z23 Encounter for immunization: Secondary | ICD-10-CM

## 2012-01-28 DIAGNOSIS — Z720 Tobacco use: Secondary | ICD-10-CM

## 2012-01-28 DIAGNOSIS — F329 Major depressive disorder, single episode, unspecified: Secondary | ICD-10-CM

## 2012-01-28 DIAGNOSIS — J320 Chronic maxillary sinusitis: Secondary | ICD-10-CM

## 2012-01-28 DIAGNOSIS — F17200 Nicotine dependence, unspecified, uncomplicated: Secondary | ICD-10-CM

## 2012-01-28 DIAGNOSIS — F32A Depression, unspecified: Secondary | ICD-10-CM

## 2012-01-28 MED ORDER — AMOXICILLIN-POT CLAVULANATE 875-125 MG PO TABS: 1.0000 | ORAL_TABLET | Freq: Two times a day (BID) | ORAL | Status: AC

## 2012-01-28 MED ORDER — POTASSIUM CHLORIDE CRYS ER 10 MEQ PO TBCR: 10.0000 meq | EXTENDED_RELEASE_TABLET | Freq: Two times a day (BID) | ORAL | Status: DC

## 2012-01-28 MED ORDER — FLUOXETINE HCL 10 MG PO CAPS: 10.0000 mg | ORAL_CAPSULE | Freq: Every day | ORAL | Status: DC

## 2012-01-28 NOTE — Patient Instructions (Addendum)
It was good to see you today. we'll make referral for CT scan of her head because of sinus symptoms and headache. Our office will contact you regarding appointment(s) once made. Take Augmentin antibiotics twice a day for 2 weeks Start Prozac 10 mg once daily for depression Your prescription(s) have been submitted to your pharmacy. Please take as directed and contact our office if you believe you are having problem(s) with the medication(s). Continue other allergy and sinus medications as reviewed Flu shot given today Please schedule followup in 2 months to review the sinus and depression symptoms, call sooner if problems. You Can Quit Smoking If you are ready to quit smoking or are thinking about it, congratulations! You have chosen to help yourself be healthier and live longer! There are lots of different ways to quit smoking. Nicotine gum, nicotine patches, a nicotine inhaler, or nicotine nasal spray can help with physical craving. Hypnosis, support groups, and medicines help break the habit of smoking. TIPS TO GET OFF AND STAY OFF CIGARETTES  Learn to predict your moods. Do not let a bad situation be your excuse to have a cigarette. Some situations in your life might tempt you to have a cigarette.   Ask friends and co-workers not to smoke around you.   Make your home smoke-free.   Never have "just one" cigarette. It leads to wanting another and another. Remind yourself of your decision to quit.   On a card, make a list of your reasons for not smoking. Read it at least the same number of times a day as you have a cigarette. Tell yourself everyday, "I do not want to smoke. I choose not to smoke."   Ask someone at home or work to help you with your plan to quit smoking.   Have something planned after you eat or have a cup of coffee. Take a walk or get other exercise to perk you up. This will help to keep you from overeating.   Try a relaxation exercise to calm you down and decrease your  stress. Remember, you may be tense and nervous the first two weeks after you quit. This will pass.   Find new activities to keep your hands busy. Play with a pen, coin, or rubber band. Doodle or draw things on paper.   Brush your teeth right after eating. This will help cut down the craving for the taste of tobacco after meals. You can try mouthwash too.   Try gum, breath mints, or diet candy to keep something in your mouth.  IF YOU SMOKE AND WANT TO QUIT:  Do not stock up on cigarettes. Never buy a carton. Wait until one pack is finished before you buy another.   Never carry cigarettes with you at work or at home.   Keep cigarettes as far away from you as possible. Leave them with someone else.   Never carry matches or a lighter with you.   Ask yourself, "Do I need this cigarette or is this just a reflex?"   Bet with someone that you can quit. Put cigarette money in a piggy bank every morning. If you smoke, you give up the money. If you do not smoke, by the end of the week, you keep the money.   Keep trying. It takes 21 days to change a habit!   Talk to your doctor about using medicines to help you quit. These include nicotine replacement gum, lozenges, or skin patches.  Document Released: 01/09/2009 Document Revised: 06/07/2011  Document Reviewed: 01/09/2009 [facility] Patient Information 2013 [place], [place].

## 2012-01-28 NOTE — Progress Notes (Signed)
  Subjective:    HPI  complains of head congestion symptoms  Onset 11 days ago, acute on chronic symptoms  associated with thick nasal discharge, sore throat, mild-mod frontal headache and low grade fever Also myalgias, sinus pressure and mild-mod chest congestion No relief with OTC meds Precipitated by sick contacts and weather changes  Past Medical History  Diagnosis Date  . Hereditary hemochromatosis 2004  . ASTHMA   . HYPERTENSION   . GERD   . Chronic kidney disease   . Arthritis     left shoulder   . Headache     sinus headaches   . H/O hiatal hernia   . Anxiety   . Hyperlipidemia     Review of Systems Constitutional: No fever or night sweats, no unexpected weight change Pulmonary: No pleurisy or hemoptysis Cardiovascular: No chest pain or palpitations Psyc: frequent tearful days - 4x/wk or more     Objective:   Physical Exam BP 132/88  Pulse 100  Temp 98.1 F (36.7 C) (Oral)  Ht 5\' 2"  (1.575 m)  Wt 147 lb 6.4 oz (66.86 kg)  BMI 26.96 kg/m2  SpO2 95% GEN: mildly ill appearing and audible head congestion HENT: NCAT, mild sinus tenderness L maxiilary region, nares with clear discharge, oropharynx mod erythema, no exudate; TMs - R with hazy effusion Eyes: Vision grossly intact, no conjunctivitis Lungs: Clear to auscultation without rhonchi or wheeze, no increased work of breathing Cardiovascular: Regular rate and rhythm, no bilateral edema Neurologic - AAOx4, CN2-12 sym intact - MAE well, balance and coordination are normal Psyc - very tearful, emotional - depressed mood/affect with limited insight  Lab Results  Component Value Date   WBC 9.3 12/10/2011   HGB 13.5 12/10/2011   HCT 38.0 12/10/2011   PLT 187 12/10/2011   GLUCOSE 113* 12/10/2011   CHOL 126 11/10/2011   TRIG 98.0 11/10/2011   HDL 48.60 11/10/2011   LDLCALC 58 11/10/2011   ALT 13 12/10/2011   AST 14 12/10/2011   NA 141 12/10/2011   K 3.8 12/15/2011   CL 106 12/10/2011   CREATININE 0.8 12/10/2011   BUN 14.0 12/10/2011   CO2 26 12/10/2011   TSH 1.43 01/18/2011      Assessment & Plan:  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

## 2012-01-29 DIAGNOSIS — F329 Major depressive disorder, single episode, unspecified: Secondary | ICD-10-CM | POA: Insufficient documentation

## 2012-01-29 NOTE — Assessment & Plan Note (Signed)
Initially precipitated by spouses death summer 08/2010 - Overlap with chronic anxiety symptoms - takes Valium for same Agrees to start Prozac now due to increased tearful episodes and depression we reviewed potential risk/benefit and possible side effects - pt understands and agrees to same  Verified no si/hi  Support offered - follow up 6 weeks, sooner if problems - pt agrees

## 2012-02-01 ENCOUNTER — Ambulatory Visit (INDEPENDENT_AMBULATORY_CARE_PROVIDER_SITE_OTHER)
Admission: RE | Admit: 2012-02-01 | Discharge: 2012-02-01 | Disposition: A | Discharge status: Home / Self Care | Payer: Medicare Other | Source: Ambulatory Visit | Attending: Internal Medicine | Admitting: Internal Medicine

## 2012-02-01 DIAGNOSIS — J320 Chronic maxillary sinusitis: Secondary | ICD-10-CM

## 2012-02-01 DIAGNOSIS — R51 Headache: Secondary | ICD-10-CM

## 2012-02-15 ENCOUNTER — Ambulatory Visit (INDEPENDENT_AMBULATORY_CARE_PROVIDER_SITE_OTHER): Payer: Medicare Other | Admitting: Cardiology

## 2012-02-15 ENCOUNTER — Encounter: Payer: Self-pay | Admitting: Cardiology

## 2012-02-15 VITALS — BP 123/74 | HR 91 | Ht 62.0 in | Wt 151.4 lb

## 2012-02-15 DIAGNOSIS — E785 Hyperlipidemia, unspecified: Secondary | ICD-10-CM

## 2012-02-15 DIAGNOSIS — E7800 Pure hypercholesterolemia, unspecified: Secondary | ICD-10-CM

## 2012-02-15 MED ORDER — ATORVASTATIN CALCIUM 20 MG PO TABS: 20.0000 mg | ORAL_TABLET | Freq: Every day | ORAL | Status: DC

## 2012-02-15 NOTE — Patient Instructions (Addendum)
Your physician wants you to follow-up in: ONE YEAR WITH DR Shelda Pal will receive a reminder letter in the mail two months in advance. If you don't receive a letter, please call our office to schedule the follow-up appointment.   STOP CRESTOR  START LIPITOR 20 MG TAKE ONE HALF TABLET ONCE DAILY  Your physician recommends that you return for lab work in: 6 WEEKS = FASTING

## 2012-02-15 NOTE — Assessment & Plan Note (Signed)
Patient counseled on discontinuing. 

## 2012-02-15 NOTE — Assessment & Plan Note (Signed)
Continue aspirin. Patient did not tolerate Pravachol. We'll try Lipitor 10 mg daily. Check lipids and liver in 6 weeks.

## 2012-02-15 NOTE — Assessment & Plan Note (Signed)
Blood pressure controlled. Continue present medications. 

## 2012-02-15 NOTE — Progress Notes (Signed)
HPI: Pleasant female I initially saw in May of 2013 for evaluation of chest pain and coronary disease. Patient seen in the emergency room in April of 2013 with chest pain. Cardiac markers and d-dimer normal. Cardiac CTA in April of 2013 revealed: Normal LM, 25-50% LAD, nonocclusive plaque in the circumflex. Calcium score 42. Echocardiogram in May of 2013 showed normal LV function, mild LVH and mildly reduced RV function. Since I last saw her, the patient has dyspnea with more extreme activities but not with routine activities. It is relieved with rest. It is not associated with chest pain. There is no orthopnea, PND or pedal edema. There is no syncope or palpitations. There is no exertional chest pain.    Current Outpatient Prescriptions  Medication Sig Dispense Refill  . aspirin EC 81 MG tablet Take 1 tablet (81 mg total) by mouth daily.  150 tablet  2  . diazepam (VALIUM) 5 MG tablet Take 1 tablet (5 mg total) by mouth every 12 (twelve) hours as needed for anxiety.  60 tablet  1  . fexofenadine (ALLEGRA) 180 MG tablet Take 1 tablet (180 mg total) by mouth daily.  30 tablet  3  . FLUoxetine (PROZAC) 10 MG capsule Take 1 capsule (10 mg total) by mouth daily.  30 capsule  3  . fluticasone (FLONASE) 50 MCG/ACT nasal spray Place 2 sprays into the nose daily.  16 g  2  . hydrochlorothiazide (MICROZIDE) 12.5 MG capsule Take 1 capsule (12.5 mg total) by mouth every morning.  90 capsule  1  . potassium chloride (KLOR-CON M10) 10 MEQ tablet Take 1 tablet (10 mEq total) by mouth 2 (two) times daily. Or as directed  90 tablet  1  . ranitidine (ZANTAC) 150 MG capsule Take 150 mg by mouth daily.       . rosuvastatin (CRESTOR) 10 MG tablet Take 1 tablet (10 mg total) by mouth daily.  30 tablet  5  . [DISCONTINUED] potassium chloride (KLOR-CON) 10 MEQ CR tablet Take 1 tablet (10 mEq total) by mouth daily.  30 tablet  5     Past Medical History  Diagnosis Date  . Hereditary hemochromatosis 2004  . ASTHMA    . HYPERTENSION   . GERD   . Chronic kidney disease   . Arthritis     left shoulder   . Headache     sinus headaches   . H/O hiatal hernia   . Anxiety   . Hyperlipidemia   . CAD (coronary artery disease)     Past Surgical History  Procedure Date  . Cholecystectomy 03/29/09  . Appendectomy 03/29/81  . Abdominal hysterectomy 03/29/81  . Bladder tack 03/29/2002  . Other surgical history     cyst removed from intestines to ovary    . Other surgical history     tendon surgery left wrist     History   Social History  . Marital Status: Widowed    Spouse Name: N/A    Number of Children: 2  . Years of Education: N/A   Occupational History  .      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    Social History Main Topics  . Smoking status: Current Every Day Smoker -- 0.5 packs/day for 50 years    Types: Cigarettes  . Smokeless tobacco: Never Used     Comment: widowed summer 2012, lives alone. Retired from work in [employer]  . Alcohol Use: No  .  Drug Use: No  . Sexually Active: No   Other Topics Concern  . Not on file   Social History Narrative  . No narrative on file    ROS: no fevers or chills, productive cough, hemoptysis, dysphasia, odynophagia, melena, hematochezia, dysuria, hematuria, rash, seizure activity, orthopnea, PND, pedal edema, claudication. Remaining systems are negative.  Physical Exam: Well-developed well-nourished in no acute distress.  Skin is warm and dry.  HEENT is normal.  Neck is supple.  Chest is clear to auscultation with normal expansion.  Cardiovascular exam is regular rate and rhythm.  Abdominal exam nontender or distended. No masses palpated. Extremities show no edema. neuro grossly intact  ECG sinus rhythm at a rate of 84. No ST changes.

## 2012-02-15 NOTE — Assessment & Plan Note (Signed)
Patient did not tolerate Pravachol. Add Lipitor 10 mg daily and check lipids and liver in 6 weeks.

## 2012-03-06 ENCOUNTER — Encounter: Payer: Self-pay | Admitting: Internal Medicine

## 2012-03-06 ENCOUNTER — Other Ambulatory Visit: Payer: Medicare Other

## 2012-03-06 ENCOUNTER — Ambulatory Visit (INDEPENDENT_AMBULATORY_CARE_PROVIDER_SITE_OTHER): Payer: Medicare Other | Admitting: Internal Medicine

## 2012-03-06 VITALS — BP 118/82 | HR 108 | Temp 98.3°F | Ht 62.0 in | Wt 151.0 lb

## 2012-03-06 DIAGNOSIS — M62838 Other muscle spasm: Secondary | ICD-10-CM

## 2012-03-06 DIAGNOSIS — F329 Major depressive disorder, single episode, unspecified: Secondary | ICD-10-CM

## 2012-03-06 DIAGNOSIS — I10 Essential (primary) hypertension: Secondary | ICD-10-CM

## 2012-03-06 DIAGNOSIS — N390 Urinary tract infection, site not specified: Secondary | ICD-10-CM

## 2012-03-06 DIAGNOSIS — E785 Hyperlipidemia, unspecified: Secondary | ICD-10-CM

## 2012-03-06 LAB — POCT URINALYSIS DIPSTICK
Bilirubin, UA: NEGATIVE
Nitrite, UA: NEGATIVE
Protein, UA: NEGATIVE
Urobilinogen, UA: 0.2
pH, UA: 5

## 2012-03-06 MED ORDER — SULFAMETHOXAZOLE-TRIMETHOPRIM 800-160 MG PO TABS: 1.0000 | ORAL_TABLET | Freq: Two times a day (BID) | ORAL | Status: DC

## 2012-03-06 MED ORDER — FLUOXETINE HCL 10 MG PO CAPS: 10.0000 mg | ORAL_CAPSULE | Freq: Every day | ORAL | Status: DC

## 2012-03-06 MED ORDER — METHOCARBAMOL 500 MG PO TABS: 500.0000 mg | ORAL_TABLET | Freq: Three times a day (TID) | ORAL | Status: AC | PRN

## 2012-03-06 MED ORDER — HYDROCHLOROTHIAZIDE 12.5 MG PO CAPS: 12.5000 mg | ORAL_CAPSULE | ORAL | Status: DC

## 2012-03-06 NOTE — Assessment & Plan Note (Signed)
on hctz  Previously encouraged to begin daily low dose inderal to help palpitations and tremor symptoms, but beta-blocker exacerbated fatigue and depression Continue to follow up with cards   BP Readings from Last 3 Encounters:  03/06/12 118/82  02/15/12 123/74  01/28/12 132/88

## 2012-03-06 NOTE — Progress Notes (Signed)
Subjective:    Patient ID: [name] [name], female    DOB: May 15, 1945, 66 y.o.   MRN: [id number]  HPI  Here for follow up - reviewed chronic medical illness:   Depression, exac by situational grief following death of spouse - overlap with anxiety symptoms - started SSRI 01/2012, chronic Valium "prn" -the patient reports compliance with medication(s) as prescribed. Denies adverse side effects. No SI/HI  hemachromatosis - controls same with voluntary blood donation every 3 months - no hx liver problems or abnormal labs   hypertension - reports compliance with ongoing medical treatment and no changes in medication dose or frequency. denies adverse side effects related to current therapy.   Dyslipidemia - intol of prava trial, started atorva 02/2012 by cards - the patient reports compliance with medication(s) as prescribed. Denies adverse side effects.  GERD - reports compliance with ongoing medical treatment and no changes in medication dose or frequency. denies adverse side effects related to current therapy.   allergic rhinitis - associated with sinusitus - usually controls same with otc meds and neti pot use - recent increase in maxillary sinus pressure and left ear pressure consistent with prior infection symptoms - no fever or purulent discharge, no HA or fever   Past Medical History  Diagnosis Date  . Hereditary hemochromatosis 2004  . ASTHMA   . HYPERTENSION   . GERD   . Chronic kidney disease   . Arthritis     left shoulder   . Headache     sinus headaches   . H/O hiatal hernia   . Anxiety   . Hyperlipidemia   . CAD (coronary artery disease)     Review of Systems  Constitutional: Positive for fatigue. Negative for fever.  HENT: Negative for congestion, postnasal drip and tinnitus.   Genitourinary: Positive for dysuria. Negative for frequency, flank pain and difficulty urinating.  Neurological: Negative for numbness and headaches.  Psychiatric/Behavioral: Positive for  dysphoric mood. Negative for behavioral problems and confusion.       Objective:   Physical Exam  BP 118/82  Pulse 108  Temp 98.3 F (36.8 C) (Oral)  Ht 5\' 2"  (1.575 m)  Wt 151 lb (68.493 kg)  BMI 27.62 kg/m2  SpO2 96% Wt Readings from Last 3 Encounters:  03/06/12 151 lb (68.493 kg)  02/15/12 151 lb 6.4 oz (68.675 kg)  01/28/12 147 lb 6.4 oz (66.86 kg)   Constitutional: She is well-developed and well-nourished. No distress. Mild resting head/neck tremor Eyes: Conjunctivae and EOM are normal. Pupils are equal, round, and reactive to light. No scleral icterus.  Neck: Normal range of motion. Neck supple. No JVD or LAD present. No thyromegaly present.  Cardiovascular: Normal rate, regular rhythm and normal heart sounds.  No murmur heard. No BLE edema. Pulmonary/Chest: Effort normal and breath sounds normal. No respiratory distress. She has no wheezes.  Psychiatric: She has a dysphoric mood and affect, not tearful. Her behavior is normal. Judgment and thought content normal.   Lab Results  Component Value Date   WBC 9.3 12/10/2011   HGB 13.5 12/10/2011   HCT 38.0 12/10/2011   PLT 187 12/10/2011   CHOL 126 11/10/2011   TRIG 98.0 11/10/2011   HDL 48.60 11/10/2011   ALT 13 12/10/2011   AST 14 12/10/2011   NA 141 12/10/2011   K 3.8 12/15/2011   CL 106 12/10/2011   CREATININE 0.8 12/10/2011   BUN 14.0 12/10/2011   CO2 26 12/10/2011   TSH 1.43 01/18/2011  Lab Results  Component Value Date   IRON 43 05/15/2010   FERRITIN 42 12/10/2011      Assessment & Plan:  See problem list. Medications and labs reviewed today.  Dysuria - POC with trace LE, no nitrate - classic symptoms of UTI so send for Ucx and start septra

## 2012-03-06 NOTE — Assessment & Plan Note (Signed)
Pt has declined statin therapy in past - started prava 06/2011 after chest pain incident Intol of prava due to itch, then crestor since 08/2011 per cards Changed to atorva 01/2012 - so far, doing well Reviewed importance of lipid control to minimize CAD risk - as well as BP control and smoking cessation

## 2012-03-06 NOTE — Assessment & Plan Note (Signed)
Chronic - uses ibuprofen and muscle relaxer for same prn Uses robaxin muscle relaxer as needed because flexeril causes sedation -  

## 2012-03-06 NOTE — Patient Instructions (Signed)
It was good to see you today. We have reviewed your prior records including labs and tests today Test(s) ordered today. Urine culture Your results will be released to MyChart (or called to you) after review, usually within 72hours after test completion. If any changes need to be made, you will be notified at that same time. Medications reviewed and refilled, no changes at this time. antibiotics for bladder 2x/day x 5 days Your prescription(s)/refills have been submitted to your pharmacy. Please take as directed and contact our office if you believe you are having problem(s) with the medication(s). Please schedule followup in 3-4 months for depression, call sooner if problems.

## 2012-03-06 NOTE — Assessment & Plan Note (Signed)
Initially precipitated by spouse's death summer 08/2010 - Overlap with chronic anxiety symptoms - takes Valium for same started Prozac 01/2012 due to increased tearful episodes and depression -doing better Verified no si/hi  The current medical regimen is effective;  continue present plan and medications.

## 2012-03-09 MED ORDER — NITROFURANTOIN MONOHYD MACRO 100 MG PO CAPS: 100.0000 mg | ORAL_CAPSULE | Freq: Two times a day (BID) | ORAL | Status: DC

## 2012-03-09 NOTE — Addendum Note (Signed)
Addended by: Rene Paci A on: 03/09/2012 07:47 AM   Modules accepted: Orders

## 2012-03-10 ENCOUNTER — Other Ambulatory Visit (HOSPITAL_BASED_OUTPATIENT_CLINIC_OR_DEPARTMENT_OTHER): Payer: Medicare Other | Admitting: Lab

## 2012-03-10 LAB — CBC WITH DIFFERENTIAL/PLATELET
BASO%: 0.6 % (ref 0.0–2.0)
Basophils Absolute: 0.1 10*3/uL (ref 0.0–0.1)
EOS%: 0.5 % (ref 0.0–7.0)
HCT: 39.4 % (ref 34.8–46.6)
HGB: 13.5 g/dL (ref 11.6–15.9)
LYMPH%: 21.6 % (ref 14.0–49.7)
MCH: 33.3 pg (ref 25.1–34.0)
MCHC: 34.3 g/dL (ref 31.5–36.0)
MCV: 97.1 fL (ref 79.5–101.0)
NEUT%: 66.8 % (ref 38.4–76.8)
Platelets: 252 10*3/uL (ref 145–400)
lymph#: 1.8 10*3/uL (ref 0.9–3.3)

## 2012-03-11 ENCOUNTER — Other Ambulatory Visit: Payer: Self-pay | Admitting: Oncology

## 2012-03-14 ENCOUNTER — Telehealth: Payer: Self-pay | Admitting: Oncology

## 2012-03-14 ENCOUNTER — Telehealth: Payer: Self-pay | Admitting: *Deleted

## 2012-03-14 NOTE — Telephone Encounter (Signed)
Left VM for pt to return call regarding her lab work.  (need to relay message from Dr. Gaylyn Rong below.  Pt's Ferritin was elevated at 51 and he ordered phlebotomy to be done within next one to two weeks.)

## 2012-03-14 NOTE — Telephone Encounter (Signed)
Message copied by Wende Mott on Tue Mar 14, 2012 11:05 AM ------      Message from: Jethro Bolus T      Created: Sat Mar 11, 2012  2:17 PM       Please let patient know that I've placed a POF for phlebotomy 1 unit for her hemochromatosis, within the next few weeks.  Thanks.

## 2012-03-14 NOTE — Telephone Encounter (Signed)
Per staff message and POF is have scheduled appts.   JMW

## 2012-03-14 NOTE — Telephone Encounter (Signed)
s.w. pt and advised on 12.20.13 appt.Marland KitchenMarland KitchenMarland KitchenMarland Kitchenpt aware

## 2012-03-17 ENCOUNTER — Ambulatory Visit (HOSPITAL_BASED_OUTPATIENT_CLINIC_OR_DEPARTMENT_OTHER): Payer: Medicare Other

## 2012-03-17 NOTE — Patient Instructions (Addendum)
Therapeutic Phlebotomy Therapeutic phlebotomy is the controlled removal of blood from your body for the purpose of treating a medical condition. It is similar to donating blood. Usually, about a pint (470 mL) of blood is removed. The average adult has 9 to 12 pints (4.3 to 5.7 L) of blood. Therapeutic phlebotomy may be used to treat the following medical conditions:  Hemochromatosis. This is a condition in which there is too much iron in the blood.  Polycythemia vera. This is a condition in which there are too many red cells in the blood.  Porphyria cutanea tarda. This is a disease usually passed from one generation to the next (inherited). It is a condition in which an important part of hemoglobin is not made properly. This results in the build up of abnormal amounts of porphyrins in the body.  Sickle cell disease. This is an inherited disease. It is a condition in which the red blood cells form an abnormal crescent shape rather than a round shape. LET YOUR CAREGIVER KNOW ABOUT:  Allergies.  Medicines taken including herbs, eyedrops, over-the-counter medicines, and creams.  Use of steroids (by mouth or creams).  Previous problems with anesthetics or numbing medicine.  History of blood clots.  History of bleeding or blood problems.  Previous surgery.  Possibility of pregnancy, if this applies. RISKS AND COMPLICATIONS This is a simple and safe procedure. Problems are unlikely. However, problems can occur and may include:  Nausea or lightheadedness.  Low blood pressure.  Soreness, bleeding, swelling, or bruising at the needle insertion site.  Infection. BEFORE THE PROCEDURE  This is a procedure that can be done as an outpatient. Confirm the time that you need to arrive for your procedure. Confirm whether there is a need to fast or withhold any medications. It is helpful to wear clothing with sleeves that can be raised above the elbow. A blood sample may be done to determine the  amount of red blood cells or iron in your blood. Plan ahead of time to have someone drive you home after the procedure. PROCEDURE The entire procedure from preparation through recovery takes about 1 hour. The actual collection takes about 10 to 15 minutes.  A needle will be inserted into your vein.  Tubing and a collection bag will be attached to that needle.  Blood will flow through the needle and tubing into the collection bag.  You may be asked to open and close your hand slowly and continuously during the entire collection.  Once the specified amount of blood has been removed from your body, the collection bag and tubing will be clamped.  The needle will be removed.  Pressure will be held on the site of the needle insertion to stop the bleeding. Then a bandage will be placed over the needle insertion site. AFTER THE PROCEDURE  Your recovery will be assessed and monitored. If there are no problems, as an outpatient, you should be able to go home shortly after the procedure.  Document Released: 08/17/2010 Document Revised: 06/07/2011 Document Reviewed: 08/17/2010 [employer] Patient Information 2013 [employer].  

## 2012-04-05 ENCOUNTER — Other Ambulatory Visit: Payer: Medicare Other

## 2012-04-14 ENCOUNTER — Other Ambulatory Visit (INDEPENDENT_AMBULATORY_CARE_PROVIDER_SITE_OTHER): Payer: Medicare Other

## 2012-04-14 ENCOUNTER — Encounter: Payer: Self-pay | Admitting: *Deleted

## 2012-04-14 DIAGNOSIS — E7800 Pure hypercholesterolemia, unspecified: Secondary | ICD-10-CM

## 2012-04-14 LAB — LIPID PANEL
LDL Cholesterol: 75 mg/dL (ref 0–99)
Total CHOL/HDL Ratio: 3
Triglycerides: 119 mg/dL (ref 0.0–149.0)

## 2012-04-14 LAB — HEPATIC FUNCTION PANEL
AST: 17 U/L (ref 0–37)
Albumin: 4.1 g/dL (ref 3.5–5.2)
Alkaline Phosphatase: 74 U/L (ref 39–117)
Total Bilirubin: 0.5 mg/dL (ref 0.3–1.2)

## 2012-06-05 ENCOUNTER — Ambulatory Visit: Payer: Medicare Other | Admitting: Internal Medicine

## 2012-06-08 ENCOUNTER — Encounter: Payer: Self-pay | Admitting: Oncology

## 2012-06-08 ENCOUNTER — Telehealth: Payer: Self-pay | Admitting: Oncology

## 2012-06-08 ENCOUNTER — Other Ambulatory Visit (HOSPITAL_BASED_OUTPATIENT_CLINIC_OR_DEPARTMENT_OTHER): Payer: Medicare Other | Admitting: Lab

## 2012-06-08 ENCOUNTER — Ambulatory Visit (HOSPITAL_BASED_OUTPATIENT_CLINIC_OR_DEPARTMENT_OTHER): Payer: Medicare Other | Admitting: Oncology

## 2012-06-08 ENCOUNTER — Other Ambulatory Visit: Payer: Medicare Other

## 2012-06-08 ENCOUNTER — Encounter: Payer: Self-pay | Admitting: Internal Medicine

## 2012-06-08 ENCOUNTER — Ambulatory Visit (INDEPENDENT_AMBULATORY_CARE_PROVIDER_SITE_OTHER): Payer: Medicare Other | Admitting: Internal Medicine

## 2012-06-08 VITALS — BP 120/82 | HR 79 | Temp 98.1°F | Wt 153.4 lb

## 2012-06-08 DIAGNOSIS — N390 Urinary tract infection, site not specified: Secondary | ICD-10-CM

## 2012-06-08 DIAGNOSIS — I10 Essential (primary) hypertension: Secondary | ICD-10-CM

## 2012-06-08 DIAGNOSIS — F329 Major depressive disorder, single episode, unspecified: Secondary | ICD-10-CM

## 2012-06-08 DIAGNOSIS — E876 Hypokalemia: Secondary | ICD-10-CM

## 2012-06-08 DIAGNOSIS — F32A Depression, unspecified: Secondary | ICD-10-CM

## 2012-06-08 DIAGNOSIS — M62838 Other muscle spasm: Secondary | ICD-10-CM

## 2012-06-08 LAB — POCT URINALYSIS DIPSTICK
Bilirubin, UA: NEGATIVE
Glucose, UA: NEGATIVE
Ketones, UA: NEGATIVE
Nitrite, UA: NEGATIVE

## 2012-06-08 LAB — CBC WITH DIFFERENTIAL/PLATELET
Basophils Absolute: 0 10*3/uL (ref 0.0–0.1)
Eosinophils Absolute: 0 10*3/uL (ref 0.0–0.5)
HGB: 14 g/dL (ref 11.6–15.9)
MONO#: 0.2 10*3/uL (ref 0.1–0.9)
NEUT#: 7.3 10*3/uL — ABNORMAL HIGH (ref 1.5–6.5)
RBC: 4.19 10*6/uL (ref 3.70–5.45)
RDW: 12.8 % (ref 11.2–14.5)
WBC: 8.2 10*3/uL (ref 3.9–10.3)

## 2012-06-08 LAB — COMPREHENSIVE METABOLIC PANEL (CC13)
ALT: 19 U/L (ref 0–55)
AST: 15 U/L (ref 5–34)
Albumin: 3.6 g/dL (ref 3.5–5.0)
Calcium: 9.3 mg/dL (ref 8.4–10.4)
Chloride: 105 mEq/L (ref 98–107)
Potassium: 3.4 mEq/L — ABNORMAL LOW (ref 3.5–5.1)
Sodium: 140 mEq/L (ref 136–145)
Total Protein: 6.9 g/dL (ref 6.4–8.3)

## 2012-06-08 LAB — AFP TUMOR MARKER: AFP-Tumor Marker: 5.2 ng/mL (ref 0.0–8.0)

## 2012-06-08 LAB — FERRITIN: Ferritin: 24 ng/mL (ref 10–291)

## 2012-06-08 NOTE — Telephone Encounter (Signed)
Pt aware of Phlebotomy on 06/15/12

## 2012-06-08 NOTE — Assessment & Plan Note (Signed)
on hctz  Previously encouraged to begin daily low dose inderal to help palpitations and tremor symptoms, but beta-blocker exacerbated fatigue and depression Continue to follow up with cards   BP Readings from Last 3 Encounters:  06/08/12 120/82  03/17/12 142/68  03/06/12 118/82

## 2012-06-08 NOTE — Progress Notes (Signed)
Subjective:    Patient ID: [name] [name], female    DOB: 12-16-1945, 67 y.o.   MRN: [id number]  HPI  Here for follow up - reviewed chronic medical illness:   Depression, exac by situational grief following death of spouse - overlap with anxiety symptoms - started SSRI 01/2012, chronic Valium "prn" -the patient reports compliance with medication(s) as prescribed. Denies adverse side effects. No SI/HI  hemachromatosis - controls same with voluntary blood donation every 3 months - no hx liver problems or abnormal labs   hypertension - reports compliance with ongoing medical treatment and no changes in medication dose or frequency. denies adverse side effects related to current therapy.   Dyslipidemia - intol of prava trial, started atorva 02/2012 by cards - the patient reports compliance with medication(s) as prescribed. Denies adverse side effects.  GERD - reports compliance with ongoing medical treatment and no changes in medication dose or frequency. denies adverse side effects related to current therapy.   allergic rhinitis - associated with sinusitus - usually controls same with otc meds and neti pot use - recent increase in maxillary sinus pressure and left ear pressure consistent with prior infection symptoms - no fever or purulent discharge, no headache or fever   Past Medical History  Diagnosis Date  . Hereditary hemochromatosis 2004  . ASTHMA   . HYPERTENSION   . GERD   . Chronic kidney disease   . Arthritis     left shoulder   . Headache     sinus headaches   . H/O hiatal hernia   . Anxiety   . Hyperlipidemia   . CAD (coronary artery disease)     Review of Systems  Constitutional: Positive for fatigue. Negative for fever.  HENT: Negative for congestion, postnasal drip and tinnitus.   Genitourinary: Positive for dysuria. Negative for frequency, flank pain and difficulty urinating.  Neurological: Negative for numbness and headaches.  Psychiatric/Behavioral: Negative  for behavioral problems, confusion and dysphoric mood.       Objective:   Physical Exam  BP 120/82  Pulse 79  Temp(Src) 98.1 F (36.7 C) (Oral)  Wt 153 lb 6.4 oz (69.582 kg)  BMI 28.05 kg/m2  SpO2 96% Wt Readings from Last 3 Encounters:  06/08/12 153 lb 6.4 oz (69.582 kg)  03/06/12 151 lb (68.493 kg)  02/15/12 151 lb 6.4 oz (68.675 kg)   Constitutional: She is well-developed and well-nourished. No distress. Mild resting head/neck tremor Eyes: Conjunctivae and EOM are normal. Pupils are equal, round, and reactive to light. No scleral icterus.  Neck: Normal range of motion. Neck supple. No JVD or LAD present. No thyromegaly present.  Cardiovascular: Normal rate, regular rhythm and normal heart sounds.  No murmur heard. No BLE edema. Pulmonary/Chest: Effort normal and breath sounds normal. No respiratory distress. She has no wheezes.  Psychiatric: She has a brighter mood and affect, not tearful. Her behavior is normal. Judgment and thought content normal.   Lab Results  Component Value Date   WBC 8.2 03/10/2012   HGB 13.5 03/10/2012   HCT 39.4 03/10/2012   PLT 252 03/10/2012   CHOL 140 04/14/2012   TRIG 119.0 04/14/2012   HDL 41.50 04/14/2012   ALT 18 04/14/2012   AST 17 04/14/2012   NA 141 12/10/2011   K 3.8 12/15/2011   CL 106 12/10/2011   CREATININE 0.8 12/10/2011   BUN 14.0 12/10/2011   CO2 26 12/10/2011   TSH 1.43 01/18/2011   Lab Results  Component Value Date  IRON 43 05/15/2010   FERRITIN 51 03/10/2012      Assessment & Plan:  See problem list. Medications and labs reviewed today.  Dysuria - POC with blood and trace LE, no nitrate - classic symptoms of UTI so send for Ucx but hold empiric antibiotics given chronic symptoms

## 2012-06-08 NOTE — Addendum Note (Signed)
Addended by: Deatra James on: 06/08/2012 12:08 PM   Modules accepted: Orders

## 2012-06-08 NOTE — Progress Notes (Signed)
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   CURRENT THERAPY: phlebotomy for ferritin >50 (if there is no anemia)  INTERVAL HISTORY: [name] [name] 67 y.o. female returns for regular follow up.  She reports feeling well.  She has chronic osteoarthritis which does not need chronic pain med. One of her brother recently died from liver cancer.  She did not know whether he had precipitating hemochromatosis or cirrhosis.  She denied fever, anorexia, jaundice, mucositis, SOB, chest pain, palpitation, PND, orthopnea, pedal edema, abdominal swelling, early satiety.  The rest of the 14 point review of system was negative.  She does not drink EtOH.  However, she smokes occasionally.  She is trying to quit.   Past Medical History  Diagnosis Date  . Hereditary hemochromatosis 2004  . ASTHMA   . HYPERTENSION   . GERD   . Chronic kidney disease   . Arthritis     left shoulder   . Headache     sinus headaches   . H/O hiatal hernia   . Anxiety   . Hyperlipidemia   . CAD (coronary artery disease)     Past Surgical History  Procedure Laterality Date  . Cholecystectomy  03/29/09  . Appendectomy  03/29/81  . Abdominal hysterectomy  03/29/81  . Bladder tack  03/29/2002  . Other surgical history      cyst removed from intestines to ovary    . Other surgical history      tendon surgery left wrist     Current Outpatient Prescriptions  Medication Sig Dispense Refill  . aspirin EC 81 MG tablet Take 1 tablet (81 mg total) by mouth daily.  150 tablet  2  . atorvastatin (LIPITOR) 20 MG tablet Take 10 mg by mouth daily.       . diazepam (VALIUM) 5 MG tablet Take 1 tablet (5 mg total) by mouth every 12 (twelve) hours as needed for anxiety.  60 tablet  1  . fexofenadine (ALLEGRA) 180 MG tablet Take 1 tablet (180 mg total) by mouth daily.  30 tablet  3  . FLUoxetine (PROZAC) 10  MG capsule Take 1 capsule (10 mg total) by mouth daily.  90 capsule  3  . fluticasone (FLONASE) 50 MCG/ACT nasal spray Place 2 sprays into the nose daily.  16 g  2  . hydrochlorothiazide (MICROZIDE) 12.5 MG capsule Take 1 capsule (12.5 mg total) by mouth every morning.  90 capsule  3  . potassium chloride (KLOR-CON M10) 10 MEQ tablet Take 1 tablet (10 mEq total) by mouth 2 (two) times daily. Or as directed  90 tablet  1  . ranitidine (ZANTAC) 150 MG capsule Take 150 mg by mouth daily.       . [DISCONTINUED] potassium chloride (KLOR-CON) 10 MEQ CR tablet Take 1 tablet (10 mEq total) by mouth daily.  30 tablet  5   No current facility-administered medications for this visit.    ALLERGIES:  is allergic to ciprofloxacin; codeine; and morphine and related.  REVIEW OF SYSTEMS:  The rest of the 14-point review of system was negative.   Filed Vitals:   06/08/12 1415  BP: 124/72  Pulse: 96  Temp: 97.7 F (36.5 C)  Resp: 20   Wt Readings from Last 3 Encounters:  06/08/12 152 lb 1.6 oz (68.992 kg)  06/08/12 153 lb 6.4 oz (69.582 kg)  03/06/12 151  lb (68.493 kg)   ECOG Performance status: 0  PHYSICAL EXAMINATION:   General:  well-nourished woman, in no acute distress.  Eyes:  no scleral icterus.  ENT:  There were no oropharyngeal lesions.  Neck was without thyromegaly.  Lymphatics:  Negative cervical, supraclavicular or axillary adenopathy.  Respiratory: lungs were clear bilaterally without wheezing or crackles.  Cardiovascular:  Regular rate and rhythm, S1/S2, without murmur, rub or gallop.  There was no pedal edema.  GI:  abdomen was soft, flat, nontender, nondistended, without organomegaly.  Muscoloskeletal:  no spinal tenderness of palpation of vertebral spine.  Skin exam was without echymosis, petichae.  Neuro exam was nonfocal.  Patient was able to get on and off exam table without assistance.  Gait was normal.  Patient was alerted and oriented.  Attention was good.   Language was  appropriate.  Mood was normal without depression.  Speech was not pressured.  Thought content was not tangential.         LABORATORY/RADIOLOGY DATA:  Lab Results  Component Value Date   WBC 8.2 06/08/2012   HGB 14.0 06/08/2012   HCT 39.9 06/08/2012   PLT 218 06/08/2012   GLUCOSE 163* 06/08/2012   CHOL 140 04/14/2012   TRIG 119.0 04/14/2012   HDL 41.50 04/14/2012   LDLCALC 75 04/14/2012   ALKPHOS 92 06/08/2012   ALT 19 06/08/2012   AST 15 06/08/2012   NA 140 06/08/2012   K 3.4* 06/08/2012   CL 105 06/08/2012   CREATININE 0.9 06/08/2012   BUN 9.5 06/08/2012   CO2 25 06/08/2012      ASSESSMENT AND PLAN:   1.  Hemochromatosis:  Her ferritin in 02/2012 was 51.  She did not receive phlebotomy then.  She would like to have it within the next week.  She has no evidence of end-organ damage of hemochromatosis.  Will continue to check ferritin and CBC every 3 months to see if she needs phlebotomy.  She said that she had told her relatives including her children to get tested.   2.  Slightly elevated AFP last year.  AFP today is pending.  If it continues to be elevated, I may consider repeating CT scan.   3.  HLP:  She is on atorvastatin.  4.  HTP:  She is on HCTZ.   5.  Hypokalemia:  She is on KCl.   6.  Follow up:  Lab test every 3 months.  Return visit in about 1 year.    The length of time of the face-to-face encounter was 15 minutes. More than 50% of time was spent counseling and coordination of care.

## 2012-06-08 NOTE — Telephone Encounter (Signed)
Gave pt labs every 3 months and see ML on Marh 2014 with labs

## 2012-06-08 NOTE — Assessment & Plan Note (Signed)
Initially precipitated by spouse's death summer 08/2010 - Overlap with chronic anxiety symptoms - takes Valium for same started Prozac 01/2012 due to increased tearful episodes and depression -doing better, weaning self off same - take qod Verified no si/hi  The current medical regimen is effective;  continue present plan and medications.

## 2012-06-08 NOTE — Patient Instructions (Signed)
It was good to see you today. We have reviewed your prior records including labs and tests today Test(s) ordered today. Urine culture Your results will be released to MyChart (or called to you) after review, usually within 72hours after test completion. If any changes need to be made, you will be notified at that same time. Medications reviewed and refilled, no changes at this time. Please schedule followup in 6 months for depression, call sooner if problems.

## 2012-06-08 NOTE — Assessment & Plan Note (Signed)
Chronic - uses ibuprofen and muscle relaxer for same prn Uses robaxin muscle relaxer as needed because flexeril causes sedation -

## 2012-06-13 ENCOUNTER — Encounter: Payer: Self-pay | Admitting: Internal Medicine

## 2012-06-13 MED ORDER — ORPHENADRINE CITRATE ER 100 MG PO TB12: 100.0000 mg | ORAL_TABLET | Freq: Two times a day (BID) | ORAL | Status: DC | PRN

## 2012-06-14 ENCOUNTER — Encounter: Payer: Self-pay | Admitting: Internal Medicine

## 2012-07-09 ENCOUNTER — Encounter: Payer: Self-pay | Admitting: Internal Medicine

## 2012-07-31 ENCOUNTER — Telehealth: Payer: Self-pay | Admitting: Internal Medicine

## 2012-07-31 NOTE — Telephone Encounter (Signed)
Please work in for acute to discuss same

## 2012-07-31 NOTE — Telephone Encounter (Signed)
Pt's boyfriend passed away a month ago.  [name] [name] also lost her nephew and an uncle.  She is having a difficult time.  She has taken fluoxetine in the past.  It doesn't help.  Is there something else she can take.

## 2012-08-01 NOTE — Telephone Encounter (Signed)
Scheduled for an appt on Wed this week.

## 2012-08-02 ENCOUNTER — Encounter: Payer: Self-pay | Admitting: Internal Medicine

## 2012-08-02 ENCOUNTER — Ambulatory Visit (INDEPENDENT_AMBULATORY_CARE_PROVIDER_SITE_OTHER): Payer: Medicare Other | Admitting: Internal Medicine

## 2012-08-02 VITALS — BP 132/80 | HR 98 | Temp 98.2°F

## 2012-08-02 DIAGNOSIS — F411 Generalized anxiety disorder: Secondary | ICD-10-CM

## 2012-08-02 DIAGNOSIS — F4321 Adjustment disorder with depressed mood: Secondary | ICD-10-CM

## 2012-08-02 DIAGNOSIS — F323 Major depressive disorder, single episode, severe with psychotic features: Secondary | ICD-10-CM

## 2012-08-02 MED ORDER — QUETIAPINE FUMARATE 50 MG PO TABS: 50.0000 mg | ORAL_TABLET | Freq: Every day | ORAL | Status: DC

## 2012-08-02 MED ORDER — POTASSIUM CHLORIDE CRYS ER 10 MEQ PO TBCR: 10.0000 meq | EXTENDED_RELEASE_TABLET | Freq: Two times a day (BID) | ORAL | Status: DC

## 2012-08-02 NOTE — Patient Instructions (Signed)
It was good to see you today. We have reviewed your prior records including labs and tests today Continue fluoxetine 10mg  daily Add seroquel 1-2 at night for sleep to help manage severe depression symptoms  Your prescription(s) have been submitted to your pharmacy. Please take as directed and contact our office if you believe you are having problem(s) with the medication(s). we'll make referral to counseling. Our office will contact you regarding appointment(s) once made. Please schedule followup in 2-4 weeks, call sooner if problems.

## 2012-08-02 NOTE — Progress Notes (Signed)
  Subjective:    Patient ID: [name] [name], female    DOB: May 21, 1945, 67 y.o.   MRN: [id number]  HPI  Here for severe depression - precipitated by loss of loves ones - see recent phone note  Past Medical History  Diagnosis Date  . Hereditary hemochromatosis 2004  . ASTHMA   . HYPERTENSION   . GERD   . Chronic kidney disease   . Arthritis     left shoulder   . Headache     sinus headaches   . H/O hiatal hernia   . Anxiety   . Hyperlipidemia   . CAD (coronary artery disease)     Review of Systems  Constitutional: Positive for fatigue. Negative for fever and unexpected weight change.  Respiratory: Positive for cough. Negative for shortness of breath and wheezing.   Psychiatric/Behavioral: Positive for sleep disturbance, dysphoric mood and decreased concentration. Negative for suicidal ideas, hallucinations, behavioral problems, confusion, self-injury and agitation. The patient is nervous/anxious. The patient is not hyperactive.        Objective:   Physical Exam BP 132/80  Pulse 98  Temp(Src) 98.2 F (36.8 C) (Oral)  SpO2 97% Wt Readings from Last 3 Encounters:  06/08/12 152 lb 1.6 oz (68.992 kg)  06/08/12 153 lb 6.4 oz (69.582 kg)  03/06/12 151 lb (68.493 kg)   Constitutional: She appears well-developed and well-nourished. No distress.  Neck: Normal range of motion. Neck supple. No JVD present. No thyromegaly present.  Cardiovascular: Normal rate, regular rhythm and normal heart sounds.  No murmur heard. No BLE edema. Pulmonary/Chest: Effort normal and breath sounds normal. No respiratory distress. She has no wheezes.  Neurological: She is alert and oriented to person, place, and time. No cranial nerve deficit. Coordination, balance, strength, speech and gait are normal.  Skin: Skin is warm and dry. No rash noted. No erythema.  Psychiatric: She has a tearful, grieving mood and anxious affect. Judgment and thought content intact.   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

## 2012-08-16 ENCOUNTER — Encounter: Payer: Self-pay | Admitting: Internal Medicine

## 2012-08-16 ENCOUNTER — Ambulatory Visit (INDEPENDENT_AMBULATORY_CARE_PROVIDER_SITE_OTHER): Payer: Medicare Other | Admitting: Internal Medicine

## 2012-08-16 VITALS — BP 112/72 | HR 97 | Temp 98.6°F

## 2012-08-16 DIAGNOSIS — F4321 Adjustment disorder with depressed mood: Secondary | ICD-10-CM

## 2012-08-16 DIAGNOSIS — F329 Major depressive disorder, single episode, unspecified: Secondary | ICD-10-CM

## 2012-08-16 MED ORDER — TRAZODONE HCL 50 MG PO TABS: 25.0000 mg | ORAL_TABLET | Freq: Every evening | ORAL | Status: DC | PRN

## 2012-08-16 NOTE — Progress Notes (Signed)
  Subjective:    Patient ID: [name] [name], female    DOB: 07-29-1945, 67 y.o.   MRN: [id number]  HPI   Here for follow up severe depression -  precipitated by loss of loved ones - see recent phone note and OV 5/7  Past Medical History  Diagnosis Date  . Hereditary hemochromatosis 2004  . ASTHMA   . HYPERTENSION   . GERD   . Chronic kidney disease   . Arthritis     left shoulder   . Headache     sinus headaches   . H/O hiatal hernia   . Anxiety   . Hyperlipidemia   . CAD (coronary artery disease)     Review of Systems  Constitutional: Positive for fatigue. Negative for fever and unexpected weight change.  Respiratory: Negative for cough and shortness of breath.   Psychiatric/Behavioral: Positive for sleep disturbance, dysphoric mood and decreased concentration. Negative for suicidal ideas, hallucinations, behavioral problems, confusion and self-injury. The patient is nervous/anxious. The patient is not hyperactive.        Objective:   Physical Exam  BP 112/72  Pulse 97  Temp(Src) 98.6 F (37 C) (Oral)  SpO2 97% Wt Readings from Last 3 Encounters:  06/08/12 152 lb 1.6 oz (68.992 kg)  06/08/12 153 lb 6.4 oz (69.582 kg)  03/06/12 151 lb (68.493 kg)   Constitutional: She appears well-developed and well-nourished. No distress.  Neck: Normal range of motion. Neck supple. No JVD present. No thyromegaly present.  Cardiovascular: Normal rate, regular rhythm and normal heart sounds.  No murmur heard. No BLE edema. Pulmonary/Chest: Effort normal and breath sounds normal. No respiratory distress. She has no wheezes.  Psychiatric: She has a appropriately dysphoric, grieving mood and anxious affect. Judgment and thought content intact.   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

## 2012-08-16 NOTE — Patient Instructions (Signed)
It was good to see you today. We have reviewed your prior records including labs and tests today Continue fluoxetine 10mg  daily stop Seroquel try over the counter medications like Tylenol PM or melatonin If needed, try trazodone 1/2-1 at night for sleep to help manage severe depression symptoms  Your prescription(s) have been submitted to your pharmacy. Please take as directed and contact our office if you believe you are having problem(s) with the medication(s). Continue counseling as discussed Please schedule followup in 4-6 weeks, call sooner if problems.

## 2012-09-08 ENCOUNTER — Other Ambulatory Visit (HOSPITAL_BASED_OUTPATIENT_CLINIC_OR_DEPARTMENT_OTHER): Payer: Medicare Other | Admitting: Lab

## 2012-09-08 LAB — CBC WITH DIFFERENTIAL/PLATELET
Eosinophils Absolute: 0.1 10*3/uL (ref 0.0–0.5)
LYMPH%: 23.7 % (ref 14.0–49.7)
MCH: 34.2 pg — ABNORMAL HIGH (ref 25.1–34.0)
MCV: 94.4 fL (ref 79.5–101.0)
MONO%: 8.8 % (ref 0.0–14.0)
NEUT#: 5.3 10*3/uL (ref 1.5–6.5)
Platelets: 215 10*3/uL (ref 145–400)
RBC: 3.9 10*6/uL (ref 3.70–5.45)

## 2012-09-08 LAB — FERRITIN: Ferritin: 19 ng/mL (ref 10–291)

## 2012-09-27 ENCOUNTER — Ambulatory Visit: Payer: Medicare Other | Admitting: Internal Medicine

## 2012-10-04 ENCOUNTER — Encounter: Payer: Self-pay | Admitting: Internal Medicine

## 2012-10-05 MED ORDER — DIAZEPAM 5 MG PO TABS: 2.5000 mg | ORAL_TABLET | Freq: Two times a day (BID) | ORAL | Status: DC | PRN

## 2012-10-09 ENCOUNTER — Ambulatory Visit (INDEPENDENT_AMBULATORY_CARE_PROVIDER_SITE_OTHER): Payer: Medicare Other | Admitting: Internal Medicine

## 2012-10-09 ENCOUNTER — Encounter: Payer: Self-pay | Admitting: Internal Medicine

## 2012-10-09 VITALS — BP 120/78 | HR 96 | Temp 98.2°F | Wt 141.4 lb

## 2012-10-09 DIAGNOSIS — I10 Essential (primary) hypertension: Secondary | ICD-10-CM

## 2012-10-09 DIAGNOSIS — F4321 Adjustment disorder with depressed mood: Secondary | ICD-10-CM

## 2012-10-09 NOTE — Assessment & Plan Note (Signed)
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

## 2012-10-09 NOTE — Assessment & Plan Note (Signed)
on hctz  Previously rx'd daily low dose inderal to help palpitations and tremor symptoms, but beta-blocker exacerbated fatigue and depression Continue to follow up with cards prn  BP Readings from Last 3 Encounters:  10/09/12 120/78  08/16/12 112/72  08/02/12 132/80

## 2012-10-09 NOTE — Patient Instructions (Signed)
It was good to see you today. We have reviewed your prior records including labs and tests today Continue fluoxetine 10mg  daily and Valium as needed for sleep/nerves Ok to try over the counter medications like Benadryl, Tylenol PM or melatonin Continue counseling as discussed Please schedule followup in 4-6 months for depression and blood pressure check, call sooner if problems.

## 2012-10-09 NOTE — Progress Notes (Signed)
  Subjective:    Patient ID: [name] [name], female    DOB: [birth year], 67 y.o.   MRN: [id number]  HPI   Here for follow up severe depression -  precipitated by loss of loved ones - see recent phone notes and OV 5/7  Past Medical History  Diagnosis Date  . Hereditary hemochromatosis 2004  . ASTHMA   . HYPERTENSION   . GERD   . Chronic kidney disease   . Arthritis     left shoulder   . Headache(784.0)     sinus headaches   . H/O hiatal hernia   . Anxiety   . Hyperlipidemia   . CAD (coronary artery disease)     Review of Systems  Constitutional: Positive for fatigue. Negative for fever and unexpected weight change.  Respiratory: Negative for cough and shortness of breath.   Psychiatric/Behavioral: Positive for sleep disturbance, dysphoric mood and decreased concentration. Negative for suicidal ideas, hallucinations, behavioral problems, confusion and self-injury. The patient is nervous/anxious. The patient is not hyperactive.        Objective:   Physical Exam  BP 120/78  Pulse 96  Temp(Src) 98.2 F (36.8 C) (Oral)  Wt 141 lb 6.4 oz (64.139 kg)  BMI 25.86 kg/m2  SpO2 96% Wt Readings from Last 3 Encounters:  10/09/12 141 lb 6.4 oz (64.139 kg)  06/08/12 152 lb 1.6 oz (68.992 kg)  06/08/12 153 lb 6.4 oz (69.582 kg)   Constitutional: She appears well-developed and well-nourished. No distress.  Neck: Normal range of motion. Neck supple. No JVD present. No thyromegaly present.  Cardiovascular: Normal rate, regular rhythm and normal heart sounds.  No murmur heard. No BLE edema. Pulmonary/Chest: Effort normal and breath sounds normal. No respiratory distress. She has no wheezes.  Psychiatric: She has a appropriately dysphoric, grieving mood and anxious affect. Judgment and thought content intact.   Lab Results  Component Value Date   WBC 8.0 09/08/2012   HGB 13.3 09/08/2012   HCT 36.8 09/08/2012   PLT 215 09/08/2012   GLUCOSE 163* 06/08/2012   CHOL 140 04/14/2012   TRIG  119.0 04/14/2012   HDL 41.50 04/14/2012   LDLCALC 75 04/14/2012   ALT 19 06/08/2012   AST 15 06/08/2012   NA 140 06/08/2012   K 3.4* 06/08/2012   CL 105 06/08/2012   CREATININE 0.9 06/08/2012   BUN 9.5 06/08/2012   CO2 25 06/08/2012   TSH 1.43 01/18/2011       Assessment & Plan:   See problem list. Medications and labs reviewed today.

## 2012-12-01 ENCOUNTER — Other Ambulatory Visit (HOSPITAL_BASED_OUTPATIENT_CLINIC_OR_DEPARTMENT_OTHER): Payer: Medicare Other | Admitting: Lab

## 2012-12-01 DIAGNOSIS — E876 Hypokalemia: Secondary | ICD-10-CM

## 2012-12-01 LAB — CBC WITH DIFFERENTIAL/PLATELET
Basophils Absolute: 0 10*3/uL (ref 0.0–0.1)
Eosinophils Absolute: 0 10*3/uL (ref 0.0–0.5)
HGB: 14.1 g/dL (ref 11.6–15.9)
MCV: 96.4 fL (ref 79.5–101.0)
MONO#: 0.9 10*3/uL (ref 0.1–0.9)
NEUT#: 7 10*3/uL — ABNORMAL HIGH (ref 1.5–6.5)
RBC: 4.13 10*6/uL (ref 3.70–5.45)
RDW: 13 % (ref 11.2–14.5)
WBC: 9.5 10*3/uL (ref 3.9–10.3)
lymph#: 1.5 10*3/uL (ref 0.9–3.3)

## 2012-12-01 LAB — FERRITIN CHCC: Ferritin: 32 ng/ml (ref 9–269)

## 2012-12-02 LAB — AFP TUMOR MARKER: AFP-Tumor Marker: 6.7 ng/mL (ref 0.0–8.0)

## 2012-12-08 ENCOUNTER — Other Ambulatory Visit: Payer: Medicare Other

## 2012-12-13 ENCOUNTER — Ambulatory Visit: Payer: Medicare Other | Admitting: Internal Medicine

## 2013-01-11 ENCOUNTER — Ambulatory Visit (INDEPENDENT_AMBULATORY_CARE_PROVIDER_SITE_OTHER): Payer: Medicare Other | Admitting: Cardiology

## 2013-01-11 ENCOUNTER — Encounter: Payer: Self-pay | Admitting: Cardiology

## 2013-01-11 VITALS — BP 120/84 | HR 86 | Ht 63.0 in | Wt 143.0 lb

## 2013-01-11 DIAGNOSIS — R079 Chest pain, unspecified: Secondary | ICD-10-CM

## 2013-01-11 DIAGNOSIS — E785 Hyperlipidemia, unspecified: Secondary | ICD-10-CM

## 2013-01-11 DIAGNOSIS — I10 Essential (primary) hypertension: Secondary | ICD-10-CM

## 2013-01-11 DIAGNOSIS — Z720 Tobacco use: Secondary | ICD-10-CM

## 2013-01-11 DIAGNOSIS — I2510 Atherosclerotic heart disease of native coronary artery without angina pectoris: Secondary | ICD-10-CM

## 2013-01-11 DIAGNOSIS — F17200 Nicotine dependence, unspecified, uncomplicated: Secondary | ICD-10-CM

## 2013-01-11 NOTE — Progress Notes (Signed)
HPI: FU coronary disease. Patient seen in the emergency room in April of 2013 with chest pain. Cardiac markers and d-dimer normal. Cardiac CTA in April of 2013 revealed: Normal LM, 25-50% LAD, nonocclusive plaque in the circumflex. Calcium score 42. Echocardiogram in May of 2013 showed normal LV function, mild LVH and mildly reduced RV function. Since I last saw her in Nov 2013, she has some dyspnea on exertion but no orthopnea, PND or pedal edema. She has had 2 episodes of chest discomfort in the past 2 months. Both occurred at night. No radiation. There was some dyspnea but no nausea or diaphoresis. The pain lasted approximately 5 hours and resolved spontaneously. None in the past 3 weeks. She does not have exertional chest pain.   Current Outpatient Prescriptions  Medication Sig Dispense Refill  . aspirin EC 81 MG tablet Take 1 tablet (81 mg total) by mouth daily.  150 tablet  2  . atorvastatin (LIPITOR) 20 MG tablet Take 10 mg by mouth daily.       . diazepam (VALIUM) 5 MG tablet Take 0.5-1 tablets (2.5-5 mg total) by mouth every 12 (twelve) hours as needed for anxiety or sleep.  60 tablet  1  . fexofenadine (ALLEGRA) 180 MG tablet Take 1 tablet (180 mg total) by mouth daily.  30 tablet  3  . fluticasone (FLONASE) 50 MCG/ACT nasal spray Place 2 sprays into the nose daily.  16 g  2  . hydrochlorothiazide (MICROZIDE) 12.5 MG capsule Take 1 capsule (12.5 mg total) by mouth every morning.  90 capsule  3  . orphenadrine (NORFLEX) 100 MG tablet Take 1 tablet (100 mg total) by mouth 2 (two) times daily as needed for muscle spasms.  60 tablet  1  . potassium chloride (KLOR-CON M10) 10 MEQ tablet Take 1 tablet (10 mEq total) by mouth 2 (two) times daily. Or as directed  90 tablet  1  . ranitidine (ZANTAC) 150 MG capsule Take 150 mg by mouth daily.       . [DISCONTINUED] potassium chloride (KLOR-CON) 10 MEQ CR tablet Take 1 tablet (10 mEq total) by mouth daily.  30 tablet  5   No current  facility-administered medications for this visit.     Past Medical History  Diagnosis Date  . Hereditary hemochromatosis 2004  . ASTHMA   . HYPERTENSION   . GERD   . Chronic kidney disease   . Arthritis     left shoulder   . Headache(784.0)     sinus headaches   . H/O hiatal hernia   . Anxiety   . Hyperlipidemia   . CAD (coronary artery disease)     Past Surgical History  Procedure Laterality Date  . Cholecystectomy  03/29/09  . Appendectomy  03/29/81  . Abdominal hysterectomy  03/29/81  . Bladder tack  03/29/2002  . Other surgical history      cyst removed from intestines to ovary    . Other surgical history      tendon surgery left wrist     History   Social History  . Marital Status: Widowed    Spouse Name: N/A    Number of Children: 2  . Years of Education: N/A   Occupational History  .      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    Social History Main Topics  . Smoking status: Light Tobacco Smoker -- 0.50 packs/day for 50 years  Types: Cigarettes  . Smokeless tobacco: Never Used     Comment: widowed summer 2012, lives alone. Retired from work in [employer]  . Alcohol Use: No  . Drug Use: No  . Sexual Activity: No   Other Topics Concern  . Not on file   Social History Narrative  . No narrative on file    ROS: no fevers or chills, productive cough, hemoptysis, dysphasia, odynophagia, melena, hematochezia, dysuria, hematuria, rash, seizure activity, orthopnea, PND, pedal edema, claudication. Remaining systems are negative.  Physical Exam: Well-developed well-nourished in no acute distress.  Skin is warm and dry.  HEENT is normal.  Neck is supple.  Chest is clear to auscultation with normal expansion.  Cardiovascular exam is regular rate and rhythm.  Abdominal exam nontender or distended. No masses palpated. Extremities show no edema. neuro grossly intact  ECG sinus rhythm at a rate of 86. No ST changes.

## 2013-01-11 NOTE — Assessment & Plan Note (Signed)
Continue statin. 

## 2013-01-11 NOTE — Assessment & Plan Note (Signed)
Etiology unclear. Question GI related. Schedule Myoview risk stratification.

## 2013-01-11 NOTE — Assessment & Plan Note (Signed)
Patient counseled on discontinuing. 

## 2013-01-11 NOTE — Patient Instructions (Signed)
Your physician wants you to follow-up in: 6 MONTHS WITH DR CRENSHAW You will receive a reminder letter in the mail two months in advance. If you don't receive a letter, please call our office to schedule the follow-up appointment.   Your physician has requested that you have en exercise stress myoview. For further information please visit [email or web address]. Please follow instruction sheet, as given.   

## 2013-01-11 NOTE — Assessment & Plan Note (Signed)
Continue aspirin and statin. 

## 2013-01-11 NOTE — Assessment & Plan Note (Signed)
Continue present medications. 

## 2013-01-24 ENCOUNTER — Other Ambulatory Visit: Payer: Self-pay | Admitting: Internal Medicine

## 2013-01-31 ENCOUNTER — Ambulatory Visit (HOSPITAL_COMMUNITY): Payer: Medicare Other | Attending: Cardiology | Admitting: Radiology

## 2013-01-31 VITALS — BP 123/66 | HR 70 | Ht 63.0 in | Wt 141.0 lb

## 2013-01-31 DIAGNOSIS — E785 Hyperlipidemia, unspecified: Secondary | ICD-10-CM | POA: Insufficient documentation

## 2013-01-31 DIAGNOSIS — R079 Chest pain, unspecified: Secondary | ICD-10-CM

## 2013-01-31 DIAGNOSIS — I2510 Atherosclerotic heart disease of native coronary artery without angina pectoris: Secondary | ICD-10-CM

## 2013-01-31 DIAGNOSIS — R0609 Other forms of dyspnea: Secondary | ICD-10-CM | POA: Insufficient documentation

## 2013-01-31 DIAGNOSIS — F17200 Nicotine dependence, unspecified, uncomplicated: Secondary | ICD-10-CM | POA: Insufficient documentation

## 2013-01-31 DIAGNOSIS — R0989 Other specified symptoms and signs involving the circulatory and respiratory systems: Secondary | ICD-10-CM | POA: Insufficient documentation

## 2013-01-31 DIAGNOSIS — J45909 Unspecified asthma, uncomplicated: Secondary | ICD-10-CM | POA: Insufficient documentation

## 2013-01-31 DIAGNOSIS — R0789 Other chest pain: Secondary | ICD-10-CM | POA: Insufficient documentation

## 2013-01-31 DIAGNOSIS — Z8249 Family history of ischemic heart disease and other diseases of the circulatory system: Secondary | ICD-10-CM | POA: Insufficient documentation

## 2013-01-31 DIAGNOSIS — I10 Essential (primary) hypertension: Secondary | ICD-10-CM | POA: Insufficient documentation

## 2013-01-31 MED ORDER — TECHNETIUM TC 99M SESTAMIBI GENERIC - CARDIOLITE
30.0000 | Freq: Once | INTRAVENOUS | Status: AC | PRN
  Administered 2013-01-31: 30 via INTRAVENOUS

## 2013-01-31 MED ORDER — TECHNETIUM TC 99M SESTAMIBI GENERIC - CARDIOLITE
10.0000 | Freq: Once | INTRAVENOUS | Status: AC | PRN
  Administered 2013-01-31: 10 via INTRAVENOUS

## 2013-01-31 NOTE — Progress Notes (Signed)
[facility] 3 NUCLEAR MED [address] [place], [place] [address] [phone]    Cardiology Nuclear Med Study  [name] [name] is a 67 y.o. female     MRN : [id number]     DOB: 1945-11-13  Procedure Date: 01/31/2013  Nuclear Med Background Indication for Stress Test:  Evaluation for Ischemia History:  CAD, Echo EF 60%, Cardiac CT 2013 (N/O CAD), Asthma, MPI 15-20 yrs. No report Cardiac Risk Factors: Family History - CAD, Hypertension, Lipids and Smoker  Symptoms:  Chest Pain (last date of chest discomfort was 1 1/2 months ago), DOE   Nuclear Pre-Procedure Caffeine/Decaff Intake:  None > 12 hrs NPO After: 9:00pm   Lungs:  clear O2 Sat: 98% on room air. IV 0.9% NS with Angio Cath:  22g  IV Site: R Antecubital x 1, tolerated well IV Started by:  Irean Hong, RN  Chest Size (in):  40 Cup Size: D  Height: 5\' 3"  (1.6 m)  Weight:  141 lb (63.957 kg)  BMI:  Body mass index is 24.98 kg/(m^2). Tech Comments:  N/A    Nuclear Med Study 1 or 2 day study: 1 day  Stress Test Type:  Stress  Reading MD: Cassell Clement, MD  Order Authorizing Provider:  Olga Millers, MD  Resting Radionuclide: Technetium 30m Sestamibi  Resting Radionuclide Dose: 11.0 mCi   Stress Radionuclide:  Technetium 41m Sestamibi  Stress Radionuclide Dose: 33.0 mCi           Stress Protocol Rest HR: 70 Stress HR: 142  Rest BP: 123/66 Stress BP: 168/64  Exercise Time (min): 6:30 METS: 7.7   Predicted Max HR: 153 bpm % Max HR: 92.81 bpm Rate Pressure Product: 21308   Dose of Adenosine (mg):  n/a Dose of Lexiscan: n/a mg  Dose of Atropine (mg): n/a Dose of Dobutamine: n/a mcg/kg/min (at max HR)  Stress Test Technologist: Nelson Chimes, BS-ES  Nuclear Technologist:  Doyne Keel, CNMT     Rest Procedure:  Myocardial perfusion imaging was performed at rest 45 minutes following the intravenous administration of Technetium 35m Sestamibi. Rest ECG: NSR - Normal EKG  Stress Procedure:  The  patient exercised on the treadmill utilizing the Bruce Protocol for 6:30 minutes. The patient stopped due to fatigue and denied any chest pain.  Technetium 68m Sestamibi was injected at peak exercise and myocardial perfusion imaging was performed after a brief delay. Recovery was uneventful.  Stress ECG: No significant change from baseline ECG  QPS Raw Data Images:  Normal; no motion artifact; normal heart/lung ratio. Stress Images:  Normal homogeneous uptake in all areas of the myocardium. Rest Images:  Normal homogeneous uptake in all areas of the myocardium. Subtraction (SDS):  No evidence of ischemia. Transient Ischemic Dilatation (Normal <1.22):  0.92 Lung/Heart Ratio (Normal <0.45):  0.38  Quantitative Gated Spect Images QGS EDV:  33 ml QGS ESV:  3 ml  Impression Exercise Capacity:  Good exercise capacity. BP Response:  Normal blood pressure response. Clinical Symptoms:  No significant symptoms noted. ECG Impression:  No significant ST segment change suggestive of ischemia. Comparison with Prior Nuclear Study: No images to compare  Overall Impression:  Normal stress nuclear study.  LV Ejection Fraction: 90%.  LV Wall Motion:  Vigorous LV contraction. No wall motion abnormalities   Cassell Clement

## 2013-02-01 ENCOUNTER — Other Ambulatory Visit: Payer: Self-pay

## 2013-02-02 ENCOUNTER — Telehealth: Payer: Self-pay | Admitting: Cardiology

## 2013-02-02 NOTE — Telephone Encounter (Signed)
New Problem  Pt calling for results// please call

## 2013-02-02 NOTE — Telephone Encounter (Signed)
Spoke with pt, aware of nuclear results 

## 2013-02-27 ENCOUNTER — Other Ambulatory Visit: Payer: Self-pay | Admitting: Cardiology

## 2013-03-08 ENCOUNTER — Other Ambulatory Visit: Payer: Self-pay | Admitting: Hematology and Oncology

## 2013-03-08 ENCOUNTER — Telehealth: Payer: Self-pay | Admitting: Hematology and Oncology

## 2013-03-08 NOTE — Telephone Encounter (Signed)
Per Dr Bertis Ruddy called pt and LVMM adv no need to keep appt for labs only on 12/12 stated Dr had reviewed labs and follow up in March is all that is currently indicated shh

## 2013-03-09 ENCOUNTER — Other Ambulatory Visit: Payer: Medicare Other

## 2013-04-09 ENCOUNTER — Ambulatory Visit (INDEPENDENT_AMBULATORY_CARE_PROVIDER_SITE_OTHER): Payer: Medicare Other | Admitting: Internal Medicine

## 2013-04-09 ENCOUNTER — Encounter: Payer: Self-pay | Admitting: Internal Medicine

## 2013-04-09 VITALS — BP 120/78 | HR 82 | Temp 98.5°F | Ht 62.0 in | Wt 149.4 lb

## 2013-04-09 DIAGNOSIS — F329 Major depressive disorder, single episode, unspecified: Secondary | ICD-10-CM

## 2013-04-09 DIAGNOSIS — Z23 Encounter for immunization: Secondary | ICD-10-CM

## 2013-04-09 DIAGNOSIS — Z0000 Encounter for general adult medical examination without abnormal findings: Secondary | ICD-10-CM

## 2013-04-09 DIAGNOSIS — F32A Depression, unspecified: Secondary | ICD-10-CM

## 2013-04-09 DIAGNOSIS — R739 Hyperglycemia, unspecified: Secondary | ICD-10-CM | POA: Insufficient documentation

## 2013-04-09 DIAGNOSIS — E785 Hyperlipidemia, unspecified: Secondary | ICD-10-CM

## 2013-04-09 DIAGNOSIS — I10 Essential (primary) hypertension: Secondary | ICD-10-CM

## 2013-04-09 MED ORDER — DIAZEPAM 5 MG PO TABS: 2.5000 mg | ORAL_TABLET | Freq: Two times a day (BID) | ORAL | Status: DC | PRN

## 2013-04-09 MED ORDER — POTASSIUM CHLORIDE CRYS ER 10 MEQ PO TBCR: EXTENDED_RELEASE_TABLET | ORAL | Status: DC

## 2013-04-09 NOTE — Assessment & Plan Note (Signed)
on hctz  Previously rx'd daily low dose inderal to help palpitations and tremor symptoms, but beta-blocker exacerbated fatigue and depression Continue to follow up with cards prn  BP Readings from Last 3 Encounters:  04/09/13 120/78  01/31/13 123/66  01/11/13 120/84

## 2013-04-09 NOTE — Assessment & Plan Note (Signed)
Pt had declined statin therapy in past, but started prava 06/2011 after chest pain incident Intol of prava due to itch, then crestor since 08/2011 per cards Then changed to atorva 01/2012 - so far, doing well Reviewed importance of lipid control to minimize CAD risk - as well as BP control and smoking cessation Check lipids annually and adjust as needed

## 2013-04-09 NOTE — Patient Instructions (Addendum)
It was good to see you today.  We have reviewed your prior records including labs and tests today  Health Maintenance reviewed - tetanus and annual flu shot administered today - consider colonoscopy screening for colon cancer every 10 years -all other recommended immunizations and age-appropriate screenings are up-to-date.  Test(s) ordered today. Return when you are fasting in the morning this week. Your results will be released to [place] (or called to you) after review, usually within 72hours after test completion. If any changes need to be made, you will be notified at that same time.  Medications reviewed and updated, no changes recommended at this time. Refill on medication(s) as discussed today.  Please schedule followup in 6 months for recheck and review, call sooner if problems.  Health Maintenance, Female A healthy lifestyle and preventative care can promote health and wellness.  Maintain regular health, dental, and eye exams.  Eat a healthy diet. Foods like vegetables, fruits, whole grains, low-fat dairy products, and lean protein foods contain the nutrients you need without too many calories. Decrease your intake of foods high in solid fats, added sugars, and salt. Get information about a proper diet from your caregiver, if necessary.  Regular physical exercise is one of the most important things you can do for your health. Most adults should get at least 150 minutes of moderate-intensity exercise (any activity that increases your heart rate and causes you to sweat) each week. In addition, most adults need muscle-strengthening exercises on 2 or more days a week.   Maintain a healthy weight. The body mass index (BMI) is a screening tool to identify possible weight problems. It provides an estimate of body fat based on height and weight. Your caregiver can help determine your BMI, and can help you achieve or maintain a healthy weight. For adults 20 years and older:  A BMI below 18.5  is considered underweight.  A BMI of 18.5 to 24.9 is normal.  A BMI of 25 to 29.9 is considered overweight.  A BMI of 30 and above is considered obese.  Maintain normal blood lipids and cholesterol by exercising and minimizing your intake of saturated fat. Eat a balanced diet with plenty of fruits and vegetables. Blood tests for lipids and cholesterol should begin at age 3 and be repeated every 5 years. If your lipid or cholesterol levels are high, you are over 50, or you are a high risk for heart disease, you may need your cholesterol levels checked more frequently.Ongoing high lipid and cholesterol levels should be treated with medicines if diet and exercise are not effective.  If you smoke, find out from your caregiver how to quit. If you do not use tobacco, do not start.  Lung cancer screening is recommended for adults aged 49 80 years who are at high risk for developing lung cancer because of a history of smoking. Yearly low-dose computed tomography (CT) is recommended for people who have at least a 30-pack-year history of smoking and are a current smoker or have quit within the past 15 years. A pack year of smoking is smoking an average of 1 pack of cigarettes a day for 1 year (for example: 1 pack a day for 30 years or 2 packs a day for 15 years). Yearly screening should continue until the smoker has stopped smoking for at least 15 years. Yearly screening should also be stopped for people who develop a health problem that would prevent them from having lung cancer treatment.  If you are pregnant,  do not drink alcohol. If you are breastfeeding, be very cautious about drinking alcohol. If you are not pregnant and choose to drink alcohol, do not exceed 1 drink per day. One drink is considered to be 12 ounces (355 mL) of beer, 5 ounces (148 mL) of wine, or 1.5 ounces (44 mL) of liquor.  Avoid use of street drugs. Do not share needles with anyone. Ask for help if you need support or instructions  about stopping the use of drugs.  High blood pressure causes heart disease and increases the risk of stroke. Blood pressure should be checked at least every 1 to 2 years. Ongoing high blood pressure should be treated with medicines, if weight loss and exercise are not effective.  If you are 73 to 68 years old, ask your caregiver if you should take aspirin to prevent strokes.  Diabetes screening involves taking a blood sample to check your fasting blood sugar level. This should be done once every 3 years, after age 31, if you are within normal weight and without risk factors for diabetes. Testing should be considered at a younger age or be carried out more frequently if you are overweight and have at least 1 risk factor for diabetes.  Breast cancer screening is essential preventative care for women. You should practice "breast self-awareness." This means understanding the normal appearance and feel of your breasts and may include breast self-examination. Any changes detected, no matter how small, should be reported to a caregiver. Women in their 39s and 30s should have a clinical breast exam (CBE) by a caregiver as part of a regular health exam every 1 to 3 years. After age 7, women should have a CBE every year. Starting at age 28, women should consider having a mammogram (breast X-ray) every year. Women who have a family history of breast cancer should talk to their caregiver about genetic screening. Women at a high risk of breast cancer should talk to their caregiver about having an MRI and a mammogram every year.  Breast cancer gene (BRCA)-related cancer risk assessment is recommended for women who have family members with BRCA-related cancers. BRCA-related cancers include breast, ovarian, tubal, and peritoneal cancers. Having family members with these cancers may be associated with an increased risk for harmful changes (mutations) in the breast cancer genes BRCA1 and BRCA2. Results of the assessment  will determine the need for genetic counseling and BRCA1 and BRCA2 testing.  The Pap test is a screening test for cervical cancer. Women should have a Pap test starting at age 14. Between ages 7 and 49, Pap tests should be repeated every 2 years. Beginning at age 87, you should have a Pap test every 3 years as long as the past 3 Pap tests have been normal. If you had a hysterectomy for a problem that was not cancer or a condition that could lead to cancer, then you no longer need Pap tests. If you are between ages 99 and 17, and you have had normal Pap tests going back 10 years, you no longer need Pap tests. If you have had past treatment for cervical cancer or a condition that could lead to cancer, you need Pap tests and screening for cancer for at least 20 years after your treatment. If Pap tests have been discontinued, risk factors (such as a new sexual partner) need to be reassessed to determine if screening should be resumed. Some women have medical problems that increase the chance of getting cervical cancer. In these cases,  your caregiver may recommend more frequent screening and Pap tests.  The human papillomavirus (HPV) test is an additional test that may be used for cervical cancer screening. The HPV test looks for the virus that can cause the cell changes on the cervix. The cells collected during the Pap test can be tested for HPV. The HPV test could be used to screen women aged 35 years and older, and should be used in women of any age who have unclear Pap test results. After the age of 44, women should have HPV testing at the same frequency as a Pap test.  Colorectal cancer can be detected and often prevented. Most routine colorectal cancer screening begins at the age of 65 and continues through age 79. However, your caregiver may recommend screening at an earlier age if you have risk factors for colon cancer. On a yearly basis, your caregiver may provide home test kits to check for hidden blood  in the stool. Use of a small camera at the end of a tube, to directly examine the colon (sigmoidoscopy or colonoscopy), can detect the earliest forms of colorectal cancer. Talk to your caregiver about this at age 76, when routine screening begins. Direct examination of the colon should be repeated every 5 to 10 years through age 50, unless early forms of pre-cancerous polyps or small growths are found.  Hepatitis C blood testing is recommended for all people born from [birth year] through 1965 and any individual with known risks for hepatitis C.  Practice safe sex. Use condoms and avoid high-risk sexual practices to reduce the spread of sexually transmitted infections (STIs). Sexually active women aged 50 and younger should be checked for Chlamydia, which is a common sexually transmitted infection. Older women with new or multiple partners should also be tested for Chlamydia. Testing for other STIs is recommended if you are sexually active and at increased risk.  Osteoporosis is a disease in which the bones lose minerals and strength with aging. This can result in serious bone fractures. The risk of osteoporosis can be identified using a bone density scan. Women ages 56 and over and women at risk for fractures or osteoporosis should discuss screening with their caregivers. Ask your caregiver whether you should be taking a calcium supplement or vitamin D to reduce the rate of osteoporosis.  Menopause can be associated with physical symptoms and risks. Hormone replacement therapy is available to decrease symptoms and risks. You should talk to your caregiver about whether hormone replacement therapy is right for you.  Use sunscreen. Apply sunscreen liberally and repeatedly throughout the day. You should seek shade when your shadow is shorter than you. Protect yourself by wearing long sleeves, pants, a wide-brimmed hat, and sunglasses year round, whenever you are outdoors.  Notify your caregiver of new moles or  changes in moles, especially if there is a change in shape or color. Also notify your caregiver if a mole is larger than the size of a pencil eraser.  Stay current with your immunizations. Document Released: 09/28/2010 Document Revised: 07/10/2012 Document Reviewed: 09/28/2010 [facility] Patient Information 2014 [place]. You Can Quit Smoking If you are ready to quit smoking or are thinking about it, congratulations! You have chosen to help yourself be healthier and live longer! There are lots of different ways to quit smoking. Nicotine gum, nicotine patches, a nicotine inhaler, or nicotine nasal spray can help with physical craving. Hypnosis, support groups, and medicines help break the habit of smoking. TIPS TO GET OFF AND  STAY OFF CIGARETTES  Learn to predict your moods. Do not let a bad situation be your excuse to have a cigarette. Some situations in your life might tempt you to have a cigarette.  Ask friends and co-workers not to smoke around you.  Make your home smoke-free.  Never have "just one" cigarette. It leads to wanting another and another. Remind yourself of your decision to quit.  On a card, make a list of your reasons for not smoking. Read it at least the same number of times a day as you have a cigarette. Tell yourself everyday, "I do not want to smoke. I choose not to smoke."  Ask someone at home or work to help you with your plan to quit smoking.  Have something planned after you eat or have a cup of coffee. Take a walk or get other exercise to perk you up. This will help to keep you from overeating.  Try a relaxation exercise to calm you down and decrease your stress. Remember, you may be tense and nervous the first two weeks after you quit. This will pass.  Find new activities to keep your hands busy. Play with a pen, coin, or rubber band. Doodle or draw things on paper.  Brush your teeth right after eating. This will help cut down the craving for the taste of  tobacco after meals. You can try mouthwash too.  Try gum, breath mints, or diet candy to keep something in your mouth. IF YOU SMOKE AND WANT TO QUIT:  Do not stock up on cigarettes. Never buy a carton. Wait until one pack is finished before you buy another.  Never carry cigarettes with you at work or at home.  Keep cigarettes as far away from you as possible. Leave them with someone else.  Never carry matches or a lighter with you.  Ask yourself, "Do I need this cigarette or is this just a reflex?"  Bet with someone that you can quit. Put cigarette money in a piggy bank every morning. If you smoke, you give up the money. If you do not smoke, by the end of the week, you keep the money.  Keep trying. It takes 21 days to change a habit!  Talk to your doctor about using medicines to help you quit. These include nicotine replacement gum, lozenges, or skin patches. Document Released: 01/09/2009 Document Revised: 06/07/2011 Document Reviewed: 01/09/2009 [facility] Patient Information 2014 [place].

## 2013-04-09 NOTE — Assessment & Plan Note (Signed)
Initially precipitated by spouse's death summer 08/2010 - Overlap with chronic anxiety symptoms, exacerbated by family stressors and illness - takes Valium for same - refill today started Prozac 01/2012 due to increased tearful episodes and depression -doing better, weaning self off same - takes qod Verified no si/hi  The current medical regimen is effective;  continue present plan and medications.

## 2013-04-09 NOTE — Assessment & Plan Note (Signed)
Random glucose >160 on 05/29/2012 reviewed No history of diabetes, check A1c now and minimize steroids as able No results found for this basename: HGBA1C

## 2013-04-09 NOTE — Progress Notes (Signed)
Pre-visit discussion using our clinic review tool. No additional management support is needed unless otherwise documented below in the visit note.  

## 2013-04-09 NOTE — Progress Notes (Signed)
Subjective:    Patient ID: [name] [name], female    DOB: November 07, 1945, 68 y.o.   MRN: [id number]  HPI   Here for medicare wellness  Diet: heart healthy  Physical activity: sedentary Depression/mood screen: negative Hearing: intact to whispered voice Visual acuity: grossly normal, performs annual eye exam  ADLs: capable Fall risk: none Home safety: good Cognitive evaluation: intact to orientation, naming, recall and repetition EOL planning: adv directives, full code/ I agree  I have personally reviewed and have noted 1. The patient's medical and social history 2. Their use of alcohol, tobacco or illicit drugs 3. Their current medications and supplements 4. The patient's functional ability including ADL's, fall risks, home safety risks and hearing or visual impairment. 5. Diet and physical activities 6. Evidence for depression or mood disorders    Past Medical History  Diagnosis Date  . Hereditary hemochromatosis 2004  . ASTHMA   . HYPERTENSION   . GERD   . Chronic kidney disease   . Arthritis     left shoulder   . Headache(784.0)     sinus headaches   . H/O hiatal hernia   . Anxiety   . Hyperlipidemia   . CAD (coronary artery disease)     Family History  Problem Relation Age of Onset  . Breast cancer Mother   . Hypertension Mother   . Heart disease Mother   . Cancer Mother 22    breast cancer  . Diabetes Father   . Heart disease Father   . Alcohol abuse Other   . Arthritis Other   . Cirrhosis Brother   . Cirrhosis Brother   . Cirrhosis Brother   . Cancer Brother     liver   History  Substance Use Topics  . Smoking status: Light Tobacco Smoker -- 0.50 packs/day for 50 years    Types: Cigarettes  . Smokeless tobacco: Never Used     Comment: widowed summer 2012, lives alone. Retired from work in [employer]  . Alcohol Use: No    Review of Systems  Constitutional: Positive for fatigue. Negative for fever and unexpected weight change.   Respiratory: Negative for cough and shortness of breath.   Psychiatric/Behavioral: Positive for sleep disturbance, dysphoric mood and decreased concentration. Negative for suicidal ideas, hallucinations, behavioral problems, confusion and self-injury. The patient is nervous/anxious. The patient is not hyperactive.   All other systems reviewed and are negative.       Objective:   Physical Exam BP 120/78  Pulse 82  Temp(Src) 98.5 F (36.9 C) (Oral)  Ht 5\' 2"  (1.575 m)  Wt 149 lb 6.4 oz (67.767 kg)  BMI 27.32 kg/m2  SpO2 98% Wt Readings from Last 3 Encounters:  04/09/13 149 lb 6.4 oz (67.767 kg)  01/31/13 141 lb (63.957 kg)  01/11/13 143 lb (64.864 kg)   Constitutional: She appears well-developed and well-nourished. No distress.  Neck: Normal range of motion. Neck supple. No JVD present. No thyromegaly present.  Cardiovascular: Normal rate, regular rhythm and normal heart sounds.  No murmur heard. No BLE edema. Pulmonary/Chest: Effort normal and breath sounds normal. No respiratory distress. She has no wheezes.  Psychiatric: She has an appropriately dysphoric, grieving mood and anxious affect. Judgment and thought content intact.   Lab Results  Component Value Date   WBC 9.5 12/01/2012   HGB 14.1 12/01/2012   HCT 39.8 12/01/2012   PLT 240 12/01/2012   GLUCOSE 163* 06/08/2012   CHOL 140 04/14/2012   TRIG 119.0 04/14/2012  HDL 41.50 04/14/2012   LDLCALC 75 04/14/2012   ALT 19 06/08/2012   AST 15 06/08/2012   NA 140 06/08/2012   K 3.4* 06/08/2012   CL 105 06/08/2012   CREATININE 0.9 06/08/2012   BUN 9.5 06/08/2012   CO2 25 06/08/2012   TSH 1.43 01/18/2011       Assessment & Plan:   AWV/v70.0 - Today patient counseled on age appropriate routine health concerns for screening and prevention, each reviewed and up to date or declined. Immunizations reviewed and up to date or declined. Labs ordered and reviewed. Risk factors for depression reviewed and negative. Hearing function and visual  acuity are intact. ADLs screened and addressed as needed. Functional ability and level of safety reviewed and appropriate. Education, counseling and referrals performed based on assessed risks today. Patient provided with a copy of personalized plan for preventive services.  Colo declined  Also see problem list. Medications and labs reviewed today.

## 2013-04-10 ENCOUNTER — Other Ambulatory Visit (INDEPENDENT_AMBULATORY_CARE_PROVIDER_SITE_OTHER): Payer: Medicare Other

## 2013-04-10 ENCOUNTER — Encounter: Payer: Self-pay | Admitting: Internal Medicine

## 2013-04-10 DIAGNOSIS — R739 Hyperglycemia, unspecified: Secondary | ICD-10-CM

## 2013-04-10 DIAGNOSIS — E785 Hyperlipidemia, unspecified: Secondary | ICD-10-CM

## 2013-04-10 DIAGNOSIS — R7309 Other abnormal glucose: Secondary | ICD-10-CM

## 2013-04-10 LAB — BASIC METABOLIC PANEL
BUN: 14 mg/dL (ref 6–23)
CHLORIDE: 106 meq/L (ref 96–112)
CO2: 29 mEq/L (ref 19–32)
Calcium: 9.4 mg/dL (ref 8.4–10.5)
Creatinine, Ser: 0.8 mg/dL (ref 0.4–1.2)
GFR: 71.8 mL/min (ref >60.00)
Glucose, Bld: 101 mg/dL — ABNORMAL HIGH (ref 70–99)
POTASSIUM: 3.8 meq/L (ref 3.5–5.1)
Sodium: 141 mEq/L (ref 135–145)

## 2013-04-10 LAB — LIPID PANEL
Cholesterol: 130 mg/dL (ref 0–200)
HDL: 43 mg/dL (ref >39.00)
LDL Cholesterol: 61 mg/dL (ref 0–99)
Total CHOL/HDL Ratio: 3
Triglycerides: 130 mg/dL (ref 0.0–149.0)
VLDL: 26 mg/dL (ref 0.0–40.0)

## 2013-04-10 LAB — HEMOGLOBIN A1C: HEMOGLOBIN A1C: 5.5 % (ref 4.6–6.5)

## 2013-04-13 ENCOUNTER — Telehealth: Payer: Self-pay | Admitting: Hematology and Oncology

## 2013-04-13 NOTE — Telephone Encounter (Signed)
3/13 F/U MOVED FROM [facility] TO [facility]. FORMER HH PT. START TIMES REMAIN THE SAME.

## 2013-05-18 ENCOUNTER — Other Ambulatory Visit: Payer: Self-pay | Admitting: Internal Medicine

## 2013-05-21 ENCOUNTER — Other Ambulatory Visit: Payer: Self-pay | Admitting: *Deleted

## 2013-05-21 MED ORDER — HYDROCHLOROTHIAZIDE 12.5 MG PO CAPS: 12.5000 mg | ORAL_CAPSULE | ORAL | Status: DC

## 2013-06-08 ENCOUNTER — Other Ambulatory Visit (HOSPITAL_BASED_OUTPATIENT_CLINIC_OR_DEPARTMENT_OTHER): Payer: Medicare Other

## 2013-06-08 ENCOUNTER — Telehealth: Payer: Self-pay | Admitting: Hematology and Oncology

## 2013-06-08 ENCOUNTER — Encounter: Payer: Self-pay | Admitting: Hematology and Oncology

## 2013-06-08 ENCOUNTER — Ambulatory Visit (HOSPITAL_BASED_OUTPATIENT_CLINIC_OR_DEPARTMENT_OTHER): Payer: Medicare Other | Admitting: Hematology and Oncology

## 2013-06-08 DIAGNOSIS — E876 Hypokalemia: Secondary | ICD-10-CM

## 2013-06-08 DIAGNOSIS — F17200 Nicotine dependence, unspecified, uncomplicated: Secondary | ICD-10-CM

## 2013-06-08 DIAGNOSIS — Z23 Encounter for immunization: Secondary | ICD-10-CM

## 2013-06-08 LAB — CBC WITH DIFFERENTIAL/PLATELET
BASO%: 0.4 % (ref 0.0–2.0)
Basophils Absolute: 0 10*3/uL (ref 0.0–0.1)
EOS%: 1.2 % (ref 0.0–7.0)
Eosinophils Absolute: 0.1 10*3/uL (ref 0.0–0.5)
HCT: 40.6 % (ref 34.8–46.6)
HGB: 14.2 g/dL (ref 11.6–15.9)
LYMPH#: 1.9 10*3/uL (ref 0.9–3.3)
LYMPH%: 20.6 % (ref 14.0–49.7)
MCH: 33.6 pg (ref 25.1–34.0)
MCHC: 34.8 g/dL (ref 31.5–36.0)
MCV: 96.5 fL (ref 79.5–101.0)
MONO#: 0.7 10*3/uL (ref 0.1–0.9)
MONO%: 7.2 % (ref 0.0–14.0)
NEUT#: 6.4 10*3/uL (ref 1.5–6.5)
NEUT%: 70.6 % (ref 38.4–76.8)
PLATELETS: 202 10*3/uL (ref 145–400)
RBC: 4.21 10*6/uL (ref 3.70–5.45)
RDW: 13 % (ref 11.2–14.5)
WBC: 9.1 10*3/uL (ref 3.9–10.3)

## 2013-06-08 LAB — COMPREHENSIVE METABOLIC PANEL (CC13)
ALBUMIN: 3.9 g/dL (ref 3.5–5.0)
ALT: 15 U/L (ref 0–55)
ANION GAP: 10 meq/L (ref 3–11)
AST: 12 U/L (ref 5–34)
Alkaline Phosphatase: 84 U/L (ref 40–150)
BUN: 13.8 mg/dL (ref 7.0–26.0)
CO2: 26 meq/L (ref 22–29)
Calcium: 9.5 mg/dL (ref 8.4–10.4)
Chloride: 102 mEq/L (ref 98–109)
Creatinine: 1 mg/dL (ref 0.6–1.1)
GLUCOSE: 146 mg/dL — AB (ref 70–140)
POTASSIUM: 3.1 meq/L — AB (ref 3.5–5.1)
Sodium: 139 mEq/L (ref 136–145)
Total Bilirubin: 0.4 mg/dL (ref 0.20–1.20)
Total Protein: 6.7 g/dL (ref 6.4–8.3)

## 2013-06-08 LAB — FERRITIN CHCC: FERRITIN: 51 ng/mL (ref 9–269)

## 2013-06-08 MED ORDER — PNEUMOCOCCAL VAC POLYVALENT 25 MCG/0.5ML IJ INJ
0.5000 mL | INJECTION | Freq: Once | INTRAMUSCULAR | Status: AC
  Administered 2013-06-08: 0.5 mL via INTRAMUSCULAR
  Filled 2013-06-08: qty 0.5

## 2013-06-08 NOTE — Progress Notes (Signed)
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 She was placed phlebotomy periodically to keep ferritin greater than 50. INTERVAL HISTORY: [name] [name] 68 y.o. female returns for further followup. She denies any side effects from recent phlebotomy. She has occasional leg cramps. Denies any pica.  I have reviewed the past medical history, past surgical history, social history and family history with the patient and they are unchanged from previous note.  ALLERGIES:  is allergic to ciprofloxacin; codeine; and morphine and related.  MEDICATIONS:  Current Outpatient Prescriptions  Medication Sig Dispense Refill  . aspirin EC 81 MG tablet Take 1 tablet (81 mg total) by mouth daily.  150 tablet  2  . atorvastatin (LIPITOR) 20 MG tablet TAKE ONE TABLET BY MOUTH EVERY DAY  90 tablet  3  . diazepam (VALIUM) 5 MG tablet Take 0.5-1 tablets (2.5-5 mg total) by mouth every 12 (twelve) hours as needed.  60 tablet  1  . hydrochlorothiazide (MICROZIDE) 12.5 MG capsule Take 1 capsule (12.5 mg total) by mouth every morning.  90 capsule  3  . potassium chloride (KLOR-CON M10) 10 MEQ tablet TAKE ONE TABLET BY MOUTH TWICE DAILY  60 tablet  5  . ranitidine (ZANTAC) 150 MG capsule Take 150 mg by mouth daily.       . [DISCONTINUED] potassium chloride (KLOR-CON) 10 MEQ CR tablet Take 1 tablet (10 mEq total) by mouth daily.  30 tablet  5   Current Facility-Administered Medications  Medication Dose Route Frequency Provider Last Rate Last Dose  . pneumococcal 23 valent vaccine (PNU-IMMUNE) injection 0.5 mL  0.5 mL Intramuscular Once Heath Lark, MD         REVIEW OF SYSTEMS:   Constitutional: Denies fevers, chills or night sweats Eyes: Denies blurriness of vision Ears, nose, mouth, throat, and face: Denies mucositis  or sore throat Respiratory: Denies cough, dyspnea or wheezes Cardiovascular: Denies palpitation, chest discomfort or lower extremity swelling Gastrointestinal:  Denies nausea, heartburn or change in bowel habits Skin: Denies abnormal skin rashes Lymphatics: Denies new lymphadenopathy or easy bruising Neurological:Denies numbness, tingling or new weaknesses Behavioral/Psych: Mood is stable, no new changes  All other systems were reviewed with the patient and are negative.  PHYSICAL EXAMINATION: ECOG PERFORMANCE STATUS: 0 - Asymptomatic  Filed Vitals:   06/08/13 1454  BP: 121/56  Pulse: 98  Temp: 98.8 F (37.1 C)  Resp: 18   Filed Weights   06/08/13 1454  Weight: 150 lb 12.8 oz (68.402 kg)    GENERAL:alert, no distress and comfortable SKIN: skin color, texture, turgor are normal, no rashes or significant lesions EYES: normal, Conjunctiva are pink and non-injected, sclera clear OROPHARYNX:no exudate, no erythema and lips, buccal mucosa, and tongue normal  NECK: supple, thyroid normal size, non-tender, without nodularity LYMPH:  no palpable lymphadenopathy in the cervical, axillary or inguinal LUNGS: clear to auscultation and percussion with normal breathing effort HEART: regular rate & rhythm and no murmurs and no lower extremity edema ABDOMEN:abdomen soft, non-tender and normal bowel sounds Musculoskeletal:no cyanosis of digits and no clubbing  NEURO: alert & oriented x 3 with fluent speech, no focal motor/sensory deficits  LABORATORY DATA:  I have reviewed the data as listed Results for orders placed in visit on 06/08/13 (from the past 48 hour(s))  COMPREHENSIVE METABOLIC PANEL (DE08)     Status: Abnormal  Collection Time    06/08/13  2:32 PM      Result Value Ref Range   Sodium 139  136 - 145 mEq/L   Potassium 3.1 (*) 3.5 - 5.1 mEq/L   Chloride 102  98 - 109 mEq/L   CO2 26  22 - 29 mEq/L   Glucose 146 (*) 70 - 140 mg/dl   BUN 13.8  7.0 - 26.0 mg/dL   Creatinine 1.0   0.6 - 1.1 mg/dL   Total Bilirubin 0.40  0.20 - 1.20 mg/dL   Alkaline Phosphatase 84  40 - 150 U/L   AST 12  5 - 34 U/L   ALT 15  0 - 55 U/L   Total Protein 6.7  6.4 - 8.3 g/dL   Albumin 3.9  3.5 - 5.0 g/dL   Calcium 9.5  8.4 - 10.4 mg/dL   Anion Gap 10  3 - 11 mEq/L  CBC WITH DIFFERENTIAL     Status: None   Collection Time    06/08/13  2:32 PM      Result Value Ref Range   WBC 9.1  3.9 - 10.3 10e3/uL   NEUT# 6.4  1.5 - 6.5 10e3/uL   HGB 14.2  11.6 - 15.9 g/dL   HCT 40.6  34.8 - 46.6 %   Platelets 202  145 - 400 10e3/uL   MCV 96.5  79.5 - 101.0 fL   MCH 33.6  25.1 - 34.0 pg   MCHC 34.8  31.5 - 36.0 g/dL   RBC 4.21  3.70 - 5.45 10e6/uL   RDW 13.0  11.2 - 14.5 %   lymph# 1.9  0.9 - 3.3 10e3/uL   MONO# 0.7  0.1 - 0.9 10e3/uL   Eosinophils Absolute 0.1  0.0 - 0.5 10e3/uL   Basophils Absolute 0.0  0.0 - 0.1 10e3/uL   NEUT% 70.6  38.4 - 76.8 %   LYMPH% 20.6  14.0 - 49.7 %   MONO% 7.2  0.0 - 14.0 %   EOS% 1.2  0.0 - 7.0 %   BASO% 0.4  0.0 - 2.0 %    Lab Results  Component Value Date   WBC 9.1 06/08/2013   HGB 14.2 06/08/2013   HCT 40.6 06/08/2013   MCV 96.5 06/08/2013   PLT 202 06/08/2013   ASSESSMENT & PLAN:  #1 hemachromatosis CBC is normal. Her ferritin done 6 months ago was low. I recommend recheck ferritin in 6 months and I would only do phlebotomy if her ferritin is greater than 100 #2 mild hypokalemia She is on potassium replacement #3 preventive care I recommend pneumococcal vaccination and she agreed to proceed today #4 tobacco abuse The patient is not interested to quit smoking now. Smoking cessation counseling is provider. All questions were answered. The patient knows to call the clinic with any problems, questions or concerns. No barriers to learning was detected.  I spent 15 minutes counseling the patient face to face. The total time spent in the appointment was 20 minutes and more than 50% was on counseling.     [facility], [place], MD 06/08/2013 3:31 PM

## 2013-06-08 NOTE — Telephone Encounter (Signed)
gv and printed appt sched and avs for pt for Sept adn March 2016 °

## 2013-09-04 ENCOUNTER — Encounter: Payer: Self-pay | Admitting: Internal Medicine

## 2013-09-04 ENCOUNTER — Ambulatory Visit (INDEPENDENT_AMBULATORY_CARE_PROVIDER_SITE_OTHER): Payer: Medicare Other | Admitting: Internal Medicine

## 2013-09-04 ENCOUNTER — Ambulatory Visit (INDEPENDENT_AMBULATORY_CARE_PROVIDER_SITE_OTHER)
Admission: RE | Admit: 2013-09-04 | Discharge: 2013-09-04 | Disposition: A | Discharge status: Home / Self Care | Payer: Medicare Other | Source: Ambulatory Visit | Attending: Internal Medicine | Admitting: Internal Medicine

## 2013-09-04 VITALS — BP 132/80 | HR 97 | Temp 98.5°F | Wt 156.0 lb

## 2013-09-04 DIAGNOSIS — J438 Other emphysema: Secondary | ICD-10-CM

## 2013-09-04 DIAGNOSIS — R05 Cough: Secondary | ICD-10-CM

## 2013-09-04 DIAGNOSIS — R059 Cough, unspecified: Secondary | ICD-10-CM

## 2013-09-04 DIAGNOSIS — J310 Chronic rhinitis: Secondary | ICD-10-CM

## 2013-09-04 DIAGNOSIS — J439 Emphysema, unspecified: Secondary | ICD-10-CM

## 2013-09-04 DIAGNOSIS — F17200 Nicotine dependence, unspecified, uncomplicated: Secondary | ICD-10-CM

## 2013-09-04 MED ORDER — AZITHROMYCIN 250 MG PO TABS: ORAL_TABLET | ORAL | Status: DC

## 2013-09-04 MED ORDER — BENZONATATE 200 MG PO CAPS: 200.0000 mg | ORAL_CAPSULE | Freq: Three times a day (TID) | ORAL | Status: DC | PRN

## 2013-09-04 NOTE — Progress Notes (Signed)
   Subjective:    Patient ID: [name] [name], female    DOB: Mar 11, 1946, 68 y.o.   MRN: [id number]  HPI  She describes a cough present for 3 months. She relates it to taking a pneumonia shot at the [facility]  It has progressed & is essentially dry but "rattly".  She's had scant discolored sputum rarely.  This is associated with frontal head pressure. The cough is intermittent but sometimes can occur throughout the day.  It is in the context of postnasal drainage.  She is unsure of environmental factors /triggers for her cough such as pollen, dust, fumes. She had seen an allergist in the past & taken  allergy shots  She does describe itchy, watery eyes. She has associated sneezing and wheezing  She also describes shortness of breath, as well as some heartburn.  She has smoked from 1970 to the present varying  up to a pack a day to presently one third pack a day.  She has a history of asthma bronchitis   Review of Systems  She specifically denies fever, chills, or sweats. She has no pleuritic component to her cough. She also denies any excess discolored nasal secretions.       Objective:   Physical Exam Pertinent positive findings include complete dentures. There is significant  head tremor. She has an S4 gallop. Breath sounds are markedly decreased. Clubbing of the nailbeds suggested.  General appearance:adequately nourished; no acute distress or increased work of breathing is present.  No  lymphadenopathy about the head, neck, or axilla noted.  Eyes: No conjunctival inflammation or lid edema is present. There is no scleral icterus. Ears:  External ear exam shows no significant lesions or deformities.  Otoscopic examination reveals clear canals, tympanic membranes are intact bilaterally without bulging, retraction, inflammation or discharge. Nose:  External nasal examination shows no deformity or inflammation. Nasal mucosa are dry without lesions or exudates. No septal  dislocation or deviation.No obstruction to airflow.  Oral exam: lips and gums are healthy appearing.There is no oropharyngeal erythema or exudate noted.  Neck:  No deformities, thyromegaly, masses, or tenderness noted.   Supple with full range of motion without pain.  Heart:  Normal rate and regular rhythm. S1 and S2 normal without  murmur, click, rub or other extra sounds.  Lungs:No increased work of breathing.   Extremities:  No cyanosis or edema  noted  Skin: Warm & dry w/o jaundice or tenting.         Assessment & Plan:  #1 cough present 3 months  #2 active smoker  #3 emphysema clinically  #4 rhinitis  See orders and recommendations.

## 2013-09-04 NOTE — Patient Instructions (Addendum)
Your next office appointment will be determined based upon review of your pending x-rays. Those instructions will be transmitted to you through My Chart  OR  by mail.  Plain Mucinex (NOT D) for thick secretions ;force NON dairy fluids .   Nasal cleansing in the shower as discussed with lather of mild shampoo.After 10 seconds wash off lather while  exhaling through nostrils. Make sure that all residual soap is removed to prevent irritation.  Flonase OR Nasacort AQ 1 spray in each nostril twice a day as needed. Use the "crossover" technique into opposite nostril spraying toward opposite ear @ 45 degree angle, not straight up into nostril.  Use a Neti pot daily only  as needed for significant sinus congestion; going from open side to congested side . Plain Allegra (NOT D )  160 daily , Loratidine 10 mg , OR Zyrtec 10 mg @ bedtime  as needed for itchy eyes & sneezing.  Please think about quitting smoking. Review the risks we discussed. Please call 1-800-QUIT-NOW [phone]) for free smoking cessation counseling.

## 2013-09-04 NOTE — Progress Notes (Signed)
Pre visit review using our clinic review tool, if applicable. No additional management support is needed unless otherwise documented below in the visit note. 

## 2013-10-10 ENCOUNTER — Ambulatory Visit: Payer: Medicare Other | Admitting: Internal Medicine

## 2013-10-17 ENCOUNTER — Encounter: Payer: Self-pay | Admitting: Internal Medicine

## 2013-10-17 ENCOUNTER — Other Ambulatory Visit (INDEPENDENT_AMBULATORY_CARE_PROVIDER_SITE_OTHER): Payer: Medicare Other

## 2013-10-17 ENCOUNTER — Ambulatory Visit (INDEPENDENT_AMBULATORY_CARE_PROVIDER_SITE_OTHER): Payer: Medicare Other | Admitting: Internal Medicine

## 2013-10-17 VITALS — BP 132/72 | HR 84 | Temp 98.3°F | Ht 62.0 in | Wt 157.0 lb

## 2013-10-17 DIAGNOSIS — E876 Hypokalemia: Secondary | ICD-10-CM

## 2013-10-17 DIAGNOSIS — J45909 Unspecified asthma, uncomplicated: Secondary | ICD-10-CM

## 2013-10-17 DIAGNOSIS — Z720 Tobacco use: Secondary | ICD-10-CM

## 2013-10-17 DIAGNOSIS — I10 Essential (primary) hypertension: Secondary | ICD-10-CM

## 2013-10-17 DIAGNOSIS — F17200 Nicotine dependence, unspecified, uncomplicated: Secondary | ICD-10-CM

## 2013-10-17 LAB — BASIC METABOLIC PANEL
BUN: 14 mg/dL (ref 6–23)
CALCIUM: 9.6 mg/dL (ref 8.4–10.5)
CHLORIDE: 105 meq/L (ref 96–112)
CO2: 28 mEq/L (ref 19–32)
Creatinine, Ser: 0.9 mg/dL (ref 0.4–1.2)
GFR: 66.2 mL/min (ref >60.00)
Glucose, Bld: 91 mg/dL (ref 70–99)
Potassium: 3.9 mEq/L (ref 3.5–5.1)
Sodium: 139 mEq/L (ref 135–145)

## 2013-10-17 MED ORDER — ALBUTEROL SULFATE HFA 108 (90 BASE) MCG/ACT IN AERS: 2.0000 | INHALATION_SPRAY | Freq: Four times a day (QID) | RESPIRATORY_TRACT | Status: DC | PRN

## 2013-10-17 MED ORDER — AZITHROMYCIN 250 MG PO TABS: ORAL_TABLET | ORAL | Status: DC

## 2013-10-17 NOTE — Assessment & Plan Note (Signed)
5 minutes today spent counseling patient on unhealthy effects of continued tobacco abuse and encouragement of cessation including medical options available to help the patient quit smoking. 

## 2013-10-17 NOTE — Assessment & Plan Note (Signed)
Hx same as child, no prior LABA or regular use of rescue Alb MDI Ongoing tobacco Refer for PFTs and rx Alb MDI now 5 minutes today spent counseling patient on unhealthy effects of continued tobacco abuse and encouragement of cessation including medical options available to help the patient quit smoking.

## 2013-10-17 NOTE — Progress Notes (Signed)
Subjective:    Patient ID: [name] [name], female    DOB: 1945/12/18, 68 y.o.   MRN: [id number]  HPI  Patient is here for follow up  Reviewed chronic medical issues and interval medical events  Past Medical History  Diagnosis Date  . Hereditary hemochromatosis 2004  . ASTHMA   . HYPERTENSION   . GERD   . Chronic kidney disease   . Arthritis     left shoulder   . Headache(784.0)     sinus headaches   . H/O hiatal hernia   . Anxiety   . Hyperlipidemia   . CAD (coronary artery disease)     Review of Systems  Constitutional: Negative for fever and fatigue.  HENT: Positive for congestion. Negative for mouth sores, nosebleeds, postnasal drip, rhinorrhea, sinus pressure and sneezing.   Respiratory: Positive for cough (daily, min productive - improved with Zpak tx last mo) and shortness of breath (esp with exertion or allergic exposure (mowing)).   Cardiovascular: Negative for chest pain and leg swelling.       Objective:   Physical Exam  BP 132/72  Pulse 84  Temp(Src) 98.3 F (36.8 C) (Oral)  Ht 5\' 2"  (1.575 m)  Wt 157 lb (71.215 kg)  BMI 28.71 kg/m2  SpO2 96% Wt Readings from Last 3 Encounters:  10/17/13 157 lb (71.215 kg)  09/04/13 156 lb (70.761 kg)  06/08/13 150 lb 12.8 oz (68.402 kg)   Constitutional: She appears well-developed and well-nourished. No distress. tremulous (head.neck and extremities - baseline) Neck: Normal range of motion. Neck supple. No JVD present. No thyromegaly present.  Cardiovascular: Normal rate, regular rhythm and normal heart sounds.  No murmur heard. No BLE edema. Pulmonary/Chest: Effort slightly increase at rest - diminished breath sounds at bases without wheeze.  Psychiatric: She has a mildly anxious mood and affect. Her behavior is normal. Judgment and thought content normal.   Lab Results  Component Value Date   WBC 9.1 06/08/2013   HGB 14.2 06/08/2013   HCT 40.6 06/08/2013   PLT 202 06/08/2013   GLUCOSE 146* 06/08/2013   CHOL  130 04/10/2013   TRIG 130.0 04/10/2013   HDL 43.00 04/10/2013   LDLCALC 61 04/10/2013   ALT 15 06/08/2013   AST 12 06/08/2013   NA 139 06/08/2013   K 3.1* 06/08/2013   CL 106 04/10/2013   CREATININE 1.0 06/08/2013   BUN 13.8 06/08/2013   CO2 26 06/08/2013   TSH 1.43 01/18/2011   HGBA1C 5.5 04/10/2013    Dg Chest 2 View  09/04/2013   CLINICAL DATA:  68 year old female with cough. Initial encounter. Smoker.  EXAM: CHEST  2 VIEW  COMPARISON:  07/16/2011, 04/02/2011.  FINDINGS: Increased lung volumes since 2013, with more flattening of the diaphragm. No pneumothorax or pulmonary edema. No pleural effusion or consolidation. No confluent or acute pulmonary opacity. Normal cardiac size and mediastinal contours. Visualized tracheal air column is within normal limits. No acute osseous abnormality identified. Calcified atherosclerosis of the aorta. Chronic right upper quadrant surgical clips.  IMPRESSION: No acute cardiopulmonary abnormality.   Electronically Signed   By: Lars Pinks M.D.   On: 09/04/2013 16:12       Assessment & Plan:   Problem List Items Addressed This Visit   ASTHMA - Primary     Hx same as child, no prior LABA or regular use of rescue Alb MDI Ongoing tobacco Refer for PFTs and rx Alb MDI now 5 minutes today spent counseling patient on  unhealthy effects of continued tobacco abuse and encouragement of cessation including medical options available to help the patient quit smoking.     Relevant Medications      ALBUTEROL SULFATE HFA 108 (90 BASE) MCG/ACT IN AERS   Other Relevant Orders      Pulmonary Function Test   Essential hypertension      on hctz  Previously rx'd daily low dose inderal to help palpitations and tremor symptoms, but beta-blocker exacerbated fatigue and depression Continue to follow up with cards prn  BP Readings from Last 3 Encounters:  10/17/13 132/72  09/04/13 132/80  06/08/13 121/56      Relevant Orders      Basic metabolic panel   Hypokalemia      Related to HCTZ use On Kdur bid-tid Recheck Bmet now    Relevant Orders      Basic metabolic panel   Tobacco abuse     5 minutes today spent counseling patient on unhealthy effects of continued tobacco abuse and encouragement of cessation including medical options available to help the patient quit smoking.     Relevant Orders      Pulmonary Function Test

## 2013-10-17 NOTE — Assessment & Plan Note (Signed)
Related to HCTZ use On Kdur bid-tid Recheck Bmet now

## 2013-10-17 NOTE — Assessment & Plan Note (Signed)
on hctz  Previously rx'd daily low dose inderal to help palpitations and tremor symptoms, but beta-blocker exacerbated fatigue and depression Continue to follow up with cards prn  BP Readings from Last 3 Encounters:  10/17/13 132/72  09/04/13 132/80  06/08/13 121/56

## 2013-10-17 NOTE — Patient Instructions (Signed)
It was good to see you today.  We have reviewed your prior records including labs and tests today  Test(s) ordered today. Your results will be released to Gary (or called to you) after review, usually within 72hours after test completion. If any changes need to be made, you will be notified at that same time.  Medications reviewed and updated, use Albuterol "rescue" inhaler as needed for cough or shortness of breath - no other changes recommended at this time. Refill on medication(s) as discussed today.  we'll make referral for PFTs to evaluate breathing . Our office will contact you regarding appointment(s) once made.  Please schedule followup in 3-4 months, call sooner if problems.

## 2013-10-17 NOTE — Progress Notes (Signed)
Pre visit review using our clinic review tool, if applicable. No additional management support is needed unless otherwise documented below in the visit note. 

## 2013-10-24 ENCOUNTER — Other Ambulatory Visit: Payer: Self-pay | Admitting: Internal Medicine

## 2013-10-24 NOTE — Telephone Encounter (Signed)
Faxed script back to [facility].../lmb 

## 2013-10-29 ENCOUNTER — Other Ambulatory Visit: Payer: Self-pay

## 2013-10-29 ENCOUNTER — Other Ambulatory Visit: Payer: Self-pay | Admitting: Internal Medicine

## 2013-10-29 MED ORDER — POTASSIUM CHLORIDE CRYS ER 10 MEQ PO TBCR: EXTENDED_RELEASE_TABLET | ORAL | Status: DC

## 2013-11-21 ENCOUNTER — Ambulatory Visit (INDEPENDENT_AMBULATORY_CARE_PROVIDER_SITE_OTHER): Payer: Medicare Other | Admitting: Cardiology

## 2013-11-21 ENCOUNTER — Encounter: Payer: Self-pay | Admitting: Cardiology

## 2013-11-21 VITALS — BP 130/82 | HR 86

## 2013-11-21 DIAGNOSIS — E785 Hyperlipidemia, unspecified: Secondary | ICD-10-CM

## 2013-11-21 DIAGNOSIS — F17200 Nicotine dependence, unspecified, uncomplicated: Secondary | ICD-10-CM

## 2013-11-21 DIAGNOSIS — I2510 Atherosclerotic heart disease of native coronary artery without angina pectoris: Secondary | ICD-10-CM

## 2013-11-21 DIAGNOSIS — Z720 Tobacco use: Secondary | ICD-10-CM

## 2013-11-21 NOTE — Assessment & Plan Note (Addendum)
Continue diet. Intolerant to statins. 

## 2013-11-21 NOTE — Assessment & Plan Note (Signed)
Continue aspirin. She is now intolerant to statins.

## 2013-11-21 NOTE — Progress Notes (Signed)
HPI: FU coronary disease. Cardiac CTA in April of 2013 revealed: Normal LM, 25-50% LAD, nonocclusive plaque in the circumflex. Calcium score 42. Echocardiogram in May of 2013 showed normal LV function, mild LVH and mildly reduced RV function. Nuclear study 11/14 showed EF 90 and normal perfusion. Since I last saw her, She has some dyspnea on exertion unchanged. No orthopnea, PND, pedal edema, palpitations, syncope or chest pain.   Current Outpatient Prescriptions  Medication Sig Dispense Refill  . albuterol (PROVENTIL HFA;VENTOLIN HFA) 108 (90 BASE) MCG/ACT inhaler Inhale 2 puffs into the lungs every 6 (six) hours as needed for wheezing or shortness of breath.  1 Inhaler  0  . aspirin EC 81 MG tablet Take 1 tablet (81 mg total) by mouth daily.  150 tablet  2  . diazepam (VALIUM) 5 MG tablet TAKE ONE-HALF TO ONE TABLET BY MOUTH EVERY 12 HOURS AS NEEDED  60 tablet  5  . hydrochlorothiazide (MICROZIDE) 12.5 MG capsule Take 1 capsule (12.5 mg total) by mouth every morning.  90 capsule  3  . Omega-3 Fatty Acids (FISH OIL) 1000 MG CAPS Take by mouth.      . potassium chloride (KLOR-CON M10) 10 MEQ tablet TAKE ONE TABLET BY MOUTH TWICE DAILY  180 tablet  0  . ranitidine (ZANTAC) 150 MG capsule Take 150 mg by mouth daily.       . [DISCONTINUED] potassium chloride (KLOR-CON) 10 MEQ CR tablet Take 1 tablet (10 mEq total) by mouth daily.  30 tablet  5   No current facility-administered medications for this visit.     Past Medical History  Diagnosis Date  . Hereditary hemochromatosis 2004  . ASTHMA   . HYPERTENSION   . GERD   . Chronic kidney disease   . Arthritis     left shoulder   . Headache(784.0)     sinus headaches   . H/O hiatal hernia   . Anxiety   . Hyperlipidemia   . CAD (coronary artery disease)     Past Surgical History  Procedure Laterality Date  . Cholecystectomy  03/29/09  . Appendectomy  03/29/81  . Abdominal hysterectomy  03/29/81  . Bladder tack  03/29/2002  . Other  surgical history      cyst removed from intestines to ovary    . Other surgical history      tendon surgery left wrist     History   Social History  . Marital Status: Widowed    Spouse Name: N/A    Number of Children: 2  . Years of Education: N/A   Occupational History  .      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    Social History Main Topics  . Smoking status: Light Tobacco Smoker -- 0.50 packs/day for 50 years    Types: Cigarettes  . Smokeless tobacco: Never Used     Comment: widowed summer 2012, lives alone. Retired from work in [employer]  . Alcohol Use: No  . Drug Use: No  . Sexual Activity: No   Other Topics Concern  . Not on file   Social History Narrative  . No narrative on file    ROS: no fevers or chills, productive cough, hemoptysis, dysphasia, odynophagia, melena, hematochezia, dysuria, hematuria, rash, seizure activity, orthopnea, PND, pedal edema, claudication. Remaining systems are negative.  Physical Exam: Well-developed well-nourished in no acute distress.  Skin is warm and dry.  HEENT is normal.  Neck is supple.  Chest is clear to auscultation with normal expansion.  Cardiovascular exam is regular rate and rhythm.  Abdominal exam nontender or distended. No masses palpated. Extremities show no edema. neuro grossly intact  ECG Sinus rhythm at a rate of 86. No significant ST changes.

## 2013-11-21 NOTE — Assessment & Plan Note (Signed)
Patient counseled on discontinuing. 

## 2013-11-21 NOTE — Assessment & Plan Note (Signed)
Continue present blood pressure medications. 

## 2013-11-21 NOTE — Patient Instructions (Signed)
Your physician wants you to follow-up in: ONE YEAR WITH DR CRENSHAW You will receive a reminder letter in the mail two months in advance. If you don't receive a letter, please call our office to schedule the follow-up appointment.  

## 2013-11-30 ENCOUNTER — Other Ambulatory Visit (HOSPITAL_BASED_OUTPATIENT_CLINIC_OR_DEPARTMENT_OTHER): Payer: Medicare Other

## 2013-11-30 LAB — CBC WITH DIFFERENTIAL/PLATELET
BASO%: 0.6 % (ref 0.0–2.0)
Basophils Absolute: 0 10*3/uL (ref 0.0–0.1)
EOS ABS: 0.1 10*3/uL (ref 0.0–0.5)
EOS%: 1.7 % (ref 0.0–7.0)
HEMATOCRIT: 39.8 % (ref 34.8–46.6)
HGB: 13.4 g/dL (ref 11.6–15.9)
LYMPH%: 21.2 % (ref 14.0–49.7)
MCH: 32.9 pg (ref 25.1–34.0)
MCHC: 33.7 g/dL (ref 31.5–36.0)
MCV: 97.5 fL (ref 79.5–101.0)
MONO#: 0.7 10*3/uL (ref 0.1–0.9)
MONO%: 8.9 % (ref 0.0–14.0)
NEUT%: 67.6 % (ref 38.4–76.8)
NEUTROS ABS: 5.6 10*3/uL (ref 1.5–6.5)
PLATELETS: 221 10*3/uL (ref 145–400)
RBC: 4.08 10*6/uL (ref 3.70–5.45)
RDW: 13 % (ref 11.2–14.5)
WBC: 8.3 10*3/uL (ref 3.9–10.3)
lymph#: 1.8 10*3/uL (ref 0.9–3.3)

## 2013-12-04 ENCOUNTER — Ambulatory Visit (INDEPENDENT_AMBULATORY_CARE_PROVIDER_SITE_OTHER): Payer: Medicare Other | Admitting: Internal Medicine

## 2013-12-04 ENCOUNTER — Other Ambulatory Visit: Payer: Medicare Other

## 2013-12-04 ENCOUNTER — Encounter: Payer: Self-pay | Admitting: Internal Medicine

## 2013-12-04 VITALS — BP 124/76 | HR 102 | Temp 98.3°F | Wt 161.1 lb

## 2013-12-04 DIAGNOSIS — R8290 Unspecified abnormal findings in urine: Secondary | ICD-10-CM

## 2013-12-04 DIAGNOSIS — R82998 Other abnormal findings in urine: Secondary | ICD-10-CM

## 2013-12-04 DIAGNOSIS — I2510 Atherosclerotic heart disease of native coronary artery without angina pectoris: Secondary | ICD-10-CM

## 2013-12-04 DIAGNOSIS — R300 Dysuria: Secondary | ICD-10-CM

## 2013-12-04 LAB — POCT URINALYSIS DIPSTICK
BILIRUBIN UA: NEGATIVE
Glucose, UA: NEGATIVE
KETONES UA: NEGATIVE
NITRITE UA: NEGATIVE
Protein, UA: NEGATIVE
SPEC GRAV UA: 1.015
Urobilinogen, UA: 0.2
pH, UA: 5.5

## 2013-12-04 LAB — FERRITIN CHCC: Ferritin: 46 ng/ml (ref 9–269)

## 2013-12-04 MED ORDER — NITROFURANTOIN MONOHYD MACRO 100 MG PO CAPS: 100.0000 mg | ORAL_CAPSULE | Freq: Two times a day (BID) | ORAL | Status: DC

## 2013-12-04 MED ORDER — PHENAZOPYRIDINE HCL 200 MG PO TABS: 200.0000 mg | ORAL_TABLET | Freq: Three times a day (TID) | ORAL | Status: DC | PRN

## 2013-12-04 NOTE — Progress Notes (Signed)
   Subjective:    Patient ID: [name] [name], female    DOB: [birth year], 68 y.o.   MRN: [id number]  HPI   She had malaise last week while at the beach and noted that she was retaining fluid.  As of 10/30/13 she developed flank discomfort as well as dysuria. She also had profuse diaphoresis w/o fever or chills.  From 8/4-8/6 she took a total of 4 leftover amoxicillin 500 mg tablets  She does have a history of urinary tract infections in the past. Remotely she had cystoscopy for recurrent urinary tract infections. This revealed bladder ulcers.    Review of Systems    She denies hesitancy, urgency, frequency, hematuria, or pyuria She also has no nocturia.      Objective:   Physical Exam   Positive findings include : Complete dentures. She has an intermittent head tremor. She has minimal discomfort to percussion and suprapubic palpation.  General appearance :adequately nourished; in no distress. Eyes: No conjunctival inflammation or scleral icterus is present. Oral exam: Lips and gums are healthy appearing.There is no oropharyngeal erythema or exudate noted.  Heart:  Normal rate and regular rhythm. S1 and S2 normal without gallop, murmur, click, rub or other extra sounds   Lungs:Chest clear to auscultation; no wheezes, rhonchi,rales ,or rubs present.No increased work of breathing.  Abdomen: bowel sounds normal, soft and non-tender without masses, organomegaly or hernias noted.  No guarding or rebound.  Skin:Warm & dry.  Intact without suspicious lesions or rashes ; no jaundice .Minimal  Tenting Lymphatic: No lymphadenopathy is noted about the head, neck, axilla            Assessment & Plan:  #1 dysuria with 3+ leukocytes and 2+ blood on urinalysis.  Plan: See orders

## 2013-12-04 NOTE — Patient Instructions (Signed)
Drink as much nondairy fluids as possible. Avoid spicy foods or alcohol as  these may aggravate the bladder. Do not take decongestants. Avoid narcotics if possible. 

## 2013-12-04 NOTE — Progress Notes (Signed)
Pre visit review using our clinic review tool, if applicable. No additional management support is needed unless otherwise documented below in the visit note. 

## 2013-12-06 ENCOUNTER — Other Ambulatory Visit: Payer: Self-pay | Admitting: Internal Medicine

## 2013-12-06 LAB — URINE CULTURE
COLONY COUNT: NO GROWTH
ORGANISM ID, BACTERIA: NO GROWTH

## 2013-12-07 ENCOUNTER — Ambulatory Visit (INDEPENDENT_AMBULATORY_CARE_PROVIDER_SITE_OTHER): Payer: Medicare Other | Admitting: Internal Medicine

## 2013-12-07 ENCOUNTER — Telehealth: Payer: Self-pay | Admitting: Internal Medicine

## 2013-12-07 ENCOUNTER — Other Ambulatory Visit: Payer: Self-pay

## 2013-12-07 DIAGNOSIS — F17200 Nicotine dependence, unspecified, uncomplicated: Secondary | ICD-10-CM

## 2013-12-07 DIAGNOSIS — R159 Full incontinence of feces: Secondary | ICD-10-CM

## 2013-12-07 DIAGNOSIS — J45909 Unspecified asthma, uncomplicated: Secondary | ICD-10-CM

## 2013-12-07 DIAGNOSIS — R197 Diarrhea, unspecified: Secondary | ICD-10-CM

## 2013-12-07 DIAGNOSIS — Z720 Tobacco use: Secondary | ICD-10-CM

## 2013-12-07 LAB — PULMONARY FUNCTION TEST
DL/VA % PRED: 69 %
DL/VA: 3.17 ml/min/mmHg/L
DLCO UNC % PRED: 73 %
DLCO unc: 15.94 ml/min/mmHg
FEF 25-75 PRE: 1.59 L/s
FEF 25-75 Post: 2.12 L/sec
FEF2575-%Change-Post: 33 %
FEF2575-%Pred-Post: 112 %
FEF2575-%Pred-Pre: 84 %
FEV1-%CHANGE-POST: 6 %
FEV1-%Pred-Post: 112 %
FEV1-%Pred-Pre: 106 %
FEV1-Post: 2.42 L
FEV1-Pre: 2.28 L
FEV1FVC-%Change-Post: 4 %
FEV1FVC-%Pred-Pre: 96 %
FEV6-%Change-Post: 3 %
FEV6-%PRED-POST: 117 %
FEV6-%PRED-PRE: 113 %
FEV6-POST: 3.16 L
FEV6-PRE: 3.06 L
FEV6FVC-%CHANGE-POST: 1 %
FEV6FVC-%PRED-POST: 103 %
FEV6FVC-%PRED-PRE: 102 %
FVC-%Change-Post: 1 %
FVC-%PRED-PRE: 110 %
FVC-%Pred-Post: 112 %
FVC-POST: 3.17 L
FVC-PRE: 3.12 L
Post FEV1/FVC ratio: 76 %
Post FEV6/FVC ratio: 100 %
Pre FEV1/FVC ratio: 73 %
Pre FEV6/FVC Ratio: 98 %
RV % pred: 88 %
RV: 1.81 L
TLC % pred: 105 %
TLC: 5.02 L

## 2013-12-07 NOTE — Progress Notes (Signed)
PFT done today. 

## 2013-12-07 NOTE — Telephone Encounter (Signed)
lmtcb

## 2013-12-07 NOTE — Telephone Encounter (Signed)
Patient states she is having issues with not being able to hold her bowel.

## 2013-12-07 NOTE — Telephone Encounter (Signed)
Patient is requesting a referral for a colonoscopy.

## 2013-12-08 NOTE — Telephone Encounter (Signed)
GI refer for colo ordered

## 2013-12-12 ENCOUNTER — Encounter: Payer: Self-pay | Admitting: Internal Medicine

## 2013-12-20 ENCOUNTER — Other Ambulatory Visit: Payer: Self-pay | Admitting: Internal Medicine

## 2013-12-20 ENCOUNTER — Telehealth: Payer: Self-pay | Admitting: Internal Medicine

## 2013-12-20 DIAGNOSIS — R300 Dysuria: Secondary | ICD-10-CM

## 2013-12-20 NOTE — Telephone Encounter (Signed)
Patient has appointment with tomorrow at 2:30pm at [facility].  They are still waiting on a referral.

## 2014-01-24 HISTORY — PX: CYSTOSCOPY: SUR368

## 2014-02-07 ENCOUNTER — Other Ambulatory Visit: Payer: Self-pay | Admitting: Internal Medicine

## 2014-02-12 ENCOUNTER — Ambulatory Visit (INDEPENDENT_AMBULATORY_CARE_PROVIDER_SITE_OTHER): Payer: Medicare Other | Admitting: Internal Medicine

## 2014-02-12 ENCOUNTER — Encounter: Payer: Self-pay | Admitting: Internal Medicine

## 2014-02-12 VITALS — BP 110/60 | HR 80 | Ht 62.0 in | Wt 160.4 lb

## 2014-02-12 DIAGNOSIS — R1314 Dysphagia, pharyngoesophageal phase: Secondary | ICD-10-CM

## 2014-02-12 DIAGNOSIS — I2510 Atherosclerotic heart disease of native coronary artery without angina pectoris: Secondary | ICD-10-CM

## 2014-02-12 DIAGNOSIS — K529 Noninfective gastroenteritis and colitis, unspecified: Secondary | ICD-10-CM

## 2014-02-12 MED ORDER — LOPERAMIDE HCL 2 MG PO TABS: ORAL_TABLET | ORAL | Status: DC

## 2014-02-12 MED ORDER — OMEPRAZOLE 20 MG PO CPDR: 20.0000 mg | DELAYED_RELEASE_CAPSULE | Freq: Every day | ORAL | Status: DC

## 2014-02-12 NOTE — Patient Instructions (Signed)
You have been scheduled for an endoscopy and colonoscopy. Please follow the written instructions given to you at your visit today. Please pick up your prep supplies at the pharmacy. If you use inhalers (even only as needed), please bring them with you on the day of your procedure. Your physician has requested that you go to www.startemmi.com and enter the access code given to you at your visit today. This web site gives a general overview about your procedure. However, you should still follow specific instructions given to you by our office regarding your preparation for the procedure.  We have sent the following medications to your pharmacy for you to pick up at your convenience: Omeprazole  Continue to use your Imodium every other day.   I appreciate the opportunity to care for you.

## 2014-02-12 NOTE — Progress Notes (Signed)
  Referred by Dr. [name] Subjective:    Patient ID: [name] [name], female    DOB: [birth year], 68 y.o.   MRN: [id number]  HPI Is a very nice elderly woman with a history of hemachromatosis and prior cholecystectomy who reports 6+ months of urgent loose stools associated with urge incontinence. While waiting for this appointment she decided to try loperamide and this found a 2 mg every other day makes life tolerable without urgent incontinence. She has not had nocturnal problems fever or significant abdominal pain or bleeding. When she was having treatment for urinary tract infections things got worse. She says is really been a more chronic problem that started after cholecystectomy in 2011 and this progressively worsened.   She also describes chronic intermittent solid food dysphagia. She takes ranitidine for heartburn with good control she thinks. However she is eating spicy foods for several days in a row she'll have midsternal impact dysphagia. This is relatively chronic going on for years it seems and without progression or unintentional weight loss. He has never had it evaluated. She attributes it to her hiatal hernia.  Medications, allergies, past medical history, past surgical history, family history and social history are reviewed and updated in the EMR.  Review of Systems Positive for allergies, anxiety arthritis pedal edema and some depressive symptomatology. All other review of systems are negative. Or as per history of present illness.    Objective:   Physical Exam General:  Well-developed, well-nourished and in no acute distress Eyes:  anicteric. Lungs: Clear to auscultation bilaterally. Heart:  S1S2, no rubs, murmurs, gallops. Abdomen:  soft, non-tender, no hepatosplenomegaly, hernia, or mass and BS+.  Rectal:  Deferred Lymph:  no cervical or supraclavicular adenopathy. Extremities:   no edema Skin   no rash. Neuro:  A&O x 3.  Psych:  appropriate mood and   Affect.   Data Reviewed: Labs, primary care notes, imaging in the EMR.       Assessment & Plan:   1. Chronic diarrhea    Cause not clear could be some sort of postcholecystectomy diarrhea, perhaps a collagenous colitis problem or microscopic colitis. She has not had a colonoscopy so she should have one for diagnostic purposes. In the meantime she will continue her loperamide every other day.The risks and benefits as well as alternatives of endoscopic procedure(s) have been discussed and reviewed. All questions answered. The patient agrees to proceed.  2. Dysphagia  Could be a ring neoplasm less likely Evaluate with EGDand possible dilation Change ranitidine to omeprazole Could possiblycancel EGD if dysphagia resolves completely on PPI  I appreciate the opportunity to care for this patient. CC: [name], MD

## 2014-02-18 ENCOUNTER — Other Ambulatory Visit (INDEPENDENT_AMBULATORY_CARE_PROVIDER_SITE_OTHER): Payer: Medicare Other

## 2014-02-18 ENCOUNTER — Encounter: Payer: Self-pay | Admitting: Internal Medicine

## 2014-02-18 ENCOUNTER — Ambulatory Visit (INDEPENDENT_AMBULATORY_CARE_PROVIDER_SITE_OTHER): Payer: Medicare Other | Admitting: Internal Medicine

## 2014-02-18 VITALS — BP 112/60 | HR 80 | Temp 97.7°F | Ht 62.0 in | Wt 162.2 lb

## 2014-02-18 DIAGNOSIS — K529 Noninfective gastroenteritis and colitis, unspecified: Secondary | ICD-10-CM

## 2014-02-18 DIAGNOSIS — Z23 Encounter for immunization: Secondary | ICD-10-CM

## 2014-02-18 DIAGNOSIS — J439 Emphysema, unspecified: Secondary | ICD-10-CM

## 2014-02-18 DIAGNOSIS — Z1239 Encounter for other screening for malignant neoplasm of breast: Secondary | ICD-10-CM

## 2014-02-18 DIAGNOSIS — I2510 Atherosclerotic heart disease of native coronary artery without angina pectoris: Secondary | ICD-10-CM

## 2014-02-18 HISTORY — DX: Noninfective gastroenteritis and colitis, unspecified: K52.9

## 2014-02-18 LAB — TSH: TSH: 1.64 u[IU]/mL (ref 0.35–4.50)

## 2014-02-18 MED ORDER — TIOTROPIUM BROMIDE MONOHYDRATE 18 MCG IN CAPS: 18.0000 ug | ORAL_CAPSULE | Freq: Every day | RESPIRATORY_TRACT | Status: DC

## 2014-02-18 NOTE — Assessment & Plan Note (Signed)
Mild obst dz on PFTs 12/07/13 - reviewed with pt today Using Alb MDI prn - but concerned about cost in 2016 formulary changes Will start Spiriva - take daily Counseled on importance smoking cessation

## 2014-02-18 NOTE — Assessment & Plan Note (Signed)
Reviewed interval hx - ongoing eval by GI for same Colo planned to eval same Check TSH Support offered

## 2014-02-18 NOTE — Patient Instructions (Signed)
It was good to see you today.  We have reviewed your prior records including labs and tests today  Test(s) ordered today. Your results will be released to [place] (or called to you) after review, usually within 72hours after test completion. If any changes need to be made, you will be notified at that same time.  Medications reviewed and updated Start Spiriva for breathing -use one inhalation every day,  No other changes recommended at this time. Refill on medication(s) as discussed today.  we'll make referral to [facility] for mammogram . Our office will contact you regarding appointment(s) once made.  Please schedule followup in 6 months, call sooner if problems.

## 2014-02-18 NOTE — Progress Notes (Signed)
Subjective:    Patient ID: [name] [name], female    DOB: [birth year], 68 y.o.   MRN: [id number]  HPI  Patient is here for follow up  Reviewed chronic medical issues and interval medical events  Past Medical History  Diagnosis Date  . Hereditary hemochromatosis 2004  . ASTHMA   . HYPERTENSION   . GERD   . Chronic kidney disease   . Arthritis     left shoulder   . Headache(784.0)     sinus headaches   . H/O hiatal hernia   . Anxiety   . Hyperlipidemia   . CAD (coronary artery disease)   . Depression   . Hypertension   . Urinary tract infection     Review of Systems  Constitutional: Negative for fever, fatigue and unexpected weight change.  Respiratory: Negative for cough and shortness of breath.   Cardiovascular: Negative for chest pain and leg swelling.  Gastrointestinal: Positive for diarrhea (ongoing GI eval - colo planned for same).  Neurological: Positive for tremors (with "low blood sugar" if too long between meals). Negative for weakness.       Objective:   Physical Exam  BP 112/60 mmHg  Pulse 80  Temp(Src) 97.7 F (36.5 C) (Oral)  Ht 5\' 2"  (1.575 m)  Wt 162 lb 4 oz (73.596 kg)  BMI 29.67 kg/m2  SpO2 98% Wt Readings from Last 3 Encounters:  02/18/14 162 lb 4 oz (73.596 kg)  02/12/14 160 lb 6 oz (72.746 kg)  12/04/13 161 lb 2 oz (73.086 kg)   Constitutional: She is overweight, appears well-developed and well-nourished. No distress.  Neck: Normal range of motion. Neck supple. No JVD present. No thyromegaly present.  Cardiovascular: Normal rate, regular rhythm and normal heart sounds.  No murmur heard. No BLE edema. Pulmonary/Chest: Effort normal and breath sounds diminished at bases. No respiratory distress. She has no wheezes.  Abd: SNTND, +BS Psychiatric: She has a normal mood and affect. Her behavior is normal. Judgment and thought content normal.   Lab Results  Component Value Date   WBC 8.3 11/30/2013   HGB 13.4 11/30/2013   HCT 39.8  11/30/2013   PLT 221 11/30/2013   GLUCOSE 91 10/17/2013   CHOL 130 04/10/2013   TRIG 130.0 04/10/2013   HDL 43.00 04/10/2013   LDLCALC 61 04/10/2013   ALT 15 06/08/2013   AST 12 06/08/2013   NA 139 10/17/2013   K 3.9 10/17/2013   CL 105 10/17/2013   CREATININE 0.9 10/17/2013   BUN 14 10/17/2013   CO2 28 10/17/2013   TSH 1.43 01/18/2011   HGBA1C 5.5 04/10/2013    Dg Chest 2 View  09/04/2013   CLINICAL DATA:  68 year old female with cough. Initial encounter. Smoker.  EXAM: CHEST  2 VIEW  COMPARISON:  07/16/2011, 04/02/2011.  FINDINGS: Increased lung volumes since 2013, with more flattening of the diaphragm. No pneumothorax or pulmonary edema. No pleural effusion or consolidation. No confluent or acute pulmonary opacity. Normal cardiac size and mediastinal contours. Visualized tracheal air column is within normal limits. No acute osseous abnormality identified. Calcified atherosclerosis of the aorta. Chronic right upper quadrant surgical clips.  IMPRESSION: No acute cardiopulmonary abnormality.   Electronically Signed   By: Lars Pinks M.D.   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

## 2014-02-18 NOTE — Progress Notes (Signed)
Pre visit review using our clinic review tool, if applicable. No additional management support is needed unless otherwise documented below in the visit note. 

## 2014-02-18 NOTE — Assessment & Plan Note (Signed)
S/p TAH for tx same - will resolve issue

## 2014-02-24 ENCOUNTER — Other Ambulatory Visit: Payer: Self-pay | Admitting: Internal Medicine

## 2014-02-27 ENCOUNTER — Telehealth: Payer: Self-pay | Admitting: Internal Medicine

## 2014-02-27 NOTE — Telephone Encounter (Signed)
Patient states can not afford inhaler that was prescribed.  Can not afford this.  Is requesting something else in place to be sent to [facility] in [place].

## 2014-02-28 ENCOUNTER — Ambulatory Visit (HOSPITAL_COMMUNITY)
Admission: RE | Admit: 2014-02-28 | Discharge: 2014-02-28 | Disposition: A | Discharge status: Home / Self Care | Payer: Medicare Other | Source: Ambulatory Visit | Attending: Internal Medicine | Admitting: Internal Medicine

## 2014-02-28 DIAGNOSIS — Z1231 Encounter for screening mammogram for malignant neoplasm of breast: Secondary | ICD-10-CM | POA: Diagnosis present

## 2014-02-28 DIAGNOSIS — Z1239 Encounter for other screening for malignant neoplasm of breast: Secondary | ICD-10-CM

## 2014-02-28 MED ORDER — IPRATROPIUM-ALBUTEROL 18-103 MCG/ACT IN AERO
2.0000 | INHALATION_SPRAY | Freq: Two times a day (BID) | RESPIRATORY_TRACT | Status: DC
Start: 2014-02-28 — End: 2014-05-28

## 2014-02-28 NOTE — Telephone Encounter (Signed)
LVM for pt to call back.

## 2014-02-28 NOTE — Telephone Encounter (Signed)
Will try Combivent in place of Spiriva rx - may or may not be more affordable - If combivent still too $, continue to use Albulerol rescue prn only - no other daily rx Keep [place] posted -thanks

## 2014-03-01 NOTE — Telephone Encounter (Signed)
Spoke to pt and gave MD advise listed below and to let us know if there are any issues with the new rx.

## 2014-03-12 ENCOUNTER — Other Ambulatory Visit: Payer: Self-pay | Admitting: Internal Medicine

## 2014-04-03 ENCOUNTER — Encounter: Payer: Medicare Other | Admitting: Internal Medicine

## 2014-04-15 ENCOUNTER — Encounter: Payer: Self-pay | Admitting: Internal Medicine

## 2014-04-15 ENCOUNTER — Ambulatory Visit (INDEPENDENT_AMBULATORY_CARE_PROVIDER_SITE_OTHER): Payer: Medicare Other | Admitting: Internal Medicine

## 2014-04-15 ENCOUNTER — Telehealth: Payer: Self-pay | Admitting: Internal Medicine

## 2014-04-15 VITALS — BP 120/88 | HR 85 | Temp 97.8°F | Ht 62.0 in | Wt 164.5 lb

## 2014-04-15 DIAGNOSIS — J0100 Acute maxillary sinusitis, unspecified: Secondary | ICD-10-CM

## 2014-04-15 MED ORDER — BENZONATATE 200 MG PO CAPS: 200.0000 mg | ORAL_CAPSULE | Freq: Three times a day (TID) | ORAL | Status: DC | PRN

## 2014-04-15 MED ORDER — MUPIROCIN 2 % EX OINT: TOPICAL_OINTMENT | CUTANEOUS | Status: DC

## 2014-04-15 MED ORDER — AMOXICILLIN 500 MG PO CAPS: 500.0000 mg | ORAL_CAPSULE | Freq: Three times a day (TID) | ORAL | Status: DC

## 2014-04-15 NOTE — Patient Instructions (Signed)

## 2014-04-15 NOTE — Progress Notes (Signed)
   Subjective:    Patient ID: [name] [name], female    DOB: 03/04/1946, 69 y.o.   MRN: [id number]  HPI  Symptoms began 3 weeks ago as rhinitis and head congestion. As of today she's had a significant sore throat  She is having itchy, watery eyes, and sneezing  She describes frontal headache and burning in the facial sinus areas.  The nasal discharge has been green at times.  She's had some wheezing at night related to postnasal drainage  She's also had some dried blood from the nose.  Cough has been nonproductive    Review of Systems She has not had otic pain.  She denies dyspnea, fever, chills, or sweats.    Objective:   Physical Exam  Positive or pertinent findings include: Somewhat weathered facies  She has complete dentures Nares inflammed; R septum > L. Breath sounds are decreased. She has a course intermittent tremor of the head and neck.  General appearance:good health ;well nourished; no acute distress or increased work of breathing is present.  No  lymphadenopathy about the head, neck, or axilla noted.  Eyes: No conjunctival inflammation or lid edema is present. There is no scleral icterus. Ears:  External ear exam shows no significant lesions or deformities.  Otoscopic examination reveals clear canals, tympanic membranes are intact bilaterally without bulging, retraction, inflammation or discharge. Nose:  External nasal examination shows no deformity or inflammation.  No septal dislocation or deviation.No obstruction to airflow.  Oral exam: lips and gums are healthy appearing.There is no oropharyngeal erythema or exudate noted.  Neck:  No deformities, thyromegaly, masses, or tenderness noted.   Supple with full range of motion without pain.  Heart:  Normal rate and regular rhythm. S1 and S2 normal without gallop, murmur, click, rub or other extra sounds.  Lungs:Chest clear to auscultation; no wheezes, rhonchi,rales ,or rubs present.No increased work of breathing.    Extremities:  No cyanosis, edema, or clubbing  noted  Skin: Warm & dry w/o jaundice or tenting.       Assessment & Plan:  #1 rhinosinusitis without significant bronchitis  Plan: Nasal hygiene interventions discussed. See prescription medications

## 2014-04-15 NOTE — Telephone Encounter (Signed)
emmi emailed °

## 2014-04-15 NOTE — Progress Notes (Signed)
Pre visit review using our clinic review tool, if applicable. No additional management support is needed unless otherwise documented below in the visit note. 

## 2014-04-30 ENCOUNTER — Encounter: Payer: Medicare Other | Admitting: Internal Medicine

## 2014-05-07 ENCOUNTER — Ambulatory Visit (INDEPENDENT_AMBULATORY_CARE_PROVIDER_SITE_OTHER): Payer: Medicare Other | Admitting: Internal Medicine

## 2014-05-07 ENCOUNTER — Other Ambulatory Visit (INDEPENDENT_AMBULATORY_CARE_PROVIDER_SITE_OTHER): Payer: Medicare Other

## 2014-05-07 ENCOUNTER — Encounter: Payer: Self-pay | Admitting: Internal Medicine

## 2014-05-07 VITALS — BP 130/90 | HR 100 | Temp 97.9°F | Ht 62.0 in | Wt 163.0 lb

## 2014-05-07 DIAGNOSIS — M5489 Other dorsalgia: Secondary | ICD-10-CM

## 2014-05-07 DIAGNOSIS — R300 Dysuria: Secondary | ICD-10-CM

## 2014-05-07 DIAGNOSIS — R198 Other specified symptoms and signs involving the digestive system and abdomen: Secondary | ICD-10-CM

## 2014-05-07 DIAGNOSIS — G250 Essential tremor: Secondary | ICD-10-CM

## 2014-05-07 DIAGNOSIS — R194 Change in bowel habit: Secondary | ICD-10-CM

## 2014-05-07 LAB — URINALYSIS, ROUTINE W REFLEX MICROSCOPIC
Ketones, ur: NEGATIVE
NITRITE: POSITIVE — AB
SPECIFIC GRAVITY, URINE: 1.025 (ref 1.000–1.030)
Urine Glucose: NEGATIVE
Urobilinogen, UA: 0.2 (ref 0.0–1.0)
pH: 6 (ref 5.0–8.0)

## 2014-05-07 MED ORDER — SULFAMETHOXAZOLE-TRIMETHOPRIM 800-160 MG PO TABS: 1.0000 | ORAL_TABLET | Freq: Two times a day (BID) | ORAL | Status: DC

## 2014-05-07 NOTE — Progress Notes (Signed)
Pre visit review using our clinic review tool, if applicable. No additional management support is needed unless otherwise documented below in the visit note. 

## 2014-05-07 NOTE — Patient Instructions (Addendum)
Drink as much nondairy fluids as possible. Avoid spicy foods or alcohol as  these may aggravate the bladder / prostate. Do not take decongestants. Avoid narcotics if possible.  Please take a probiotic , Florastor OR Align, every day if the bowels are loose. This will replace the normal bacteria which  are necessary for formation of normal stool and processing of food.  Fill the  prescription for antibiotic if there is not dramatic improvement in the next 48 hours with above.

## 2014-05-07 NOTE — Progress Notes (Signed)
   Subjective:    Patient ID: [name] [name], female    DOB: 09-Nov-1945, 69 y.o.   MRN: [id number]  HPI   Symptoms began 05/04/14 as dysuria and right flank pain. She has had recurrent symptoms of dysuria 4 times a year on average.She denies frequency, urgency, hematuria, or nocturia.  She does not drink much water stating that it is "hard for me to drink water".  The only urine culture on record revealed no growth 12/04/13. She was evaluated by Dr.Otteline, urologist in October 2015. He performed a cystoscopy which was negative.  She treated her current symptoms with 2 Azo tablets 2/6 which helped the dysuria. Symptoms still persist today however.  Review of Systems   She also describes frankly loose to watery stools 2-3 times most mornings  She had previously been scheduled for colonoscopy; this was cancelled by her.      Objective:   Physical Exam  Pertinent or positive findings include: She has a significant bobbing side-to-side head tremor. She has complete dentures. There is a slight resting tachycardia .  General appearance :adequately nourished; in no distress. Eyes: No conjunctival inflammation or scleral icterus is present. Oral exam: Lips and gums are healthy appearing.There is no oropharyngeal erythema or exudate noted.  Heart:   regular rhythm. S1 and S2 normal without gallop, murmur, click, rub or other extra sounds   Lungs:Chest clear to auscultation; no wheezes, rhonchi,rales ,or rubs present.No increased work of breathing.  Abdomen: bowel sounds normal, soft and non-tender without masses, organomegaly or hernias noted.  No guarding or rebound. Slight R  flank tenderness to percussion. Vascular : all pulses equal ; no bruits present. Skin:Warm & dry.  Intact without suspicious lesions or rashes ; no jaundice . Slight  tenting Lymphatic: No lymphadenopathy is noted about the head, neck, axilla Neuro: Strength, tone  normal.       Assessment & Plan:  #1 dysuria,  probable urinary tract infection #2 R flank pain & suprapubic discomfort  #3 bowel changes possibly related to irritable bowel  Plan: She describes GI intolerance with stool incontinence on Macrobid. Generic Septra DS will be prescribed pending C&S. Probiotic recommended for the bowel changes. She's been asked to reschedule the colonoscopy she had to cancel.

## 2014-05-09 LAB — URINE CULTURE: Colony Count: >=100000

## 2014-05-28 ENCOUNTER — Other Ambulatory Visit (INDEPENDENT_AMBULATORY_CARE_PROVIDER_SITE_OTHER): Payer: Medicare Other

## 2014-05-28 ENCOUNTER — Ambulatory Visit (INDEPENDENT_AMBULATORY_CARE_PROVIDER_SITE_OTHER)
Admission: RE | Admit: 2014-05-28 | Discharge: 2014-05-28 | Disposition: A | Discharge status: Home / Self Care | Payer: Medicare Other | Source: Ambulatory Visit | Attending: Internal Medicine | Admitting: Internal Medicine

## 2014-05-28 ENCOUNTER — Encounter: Payer: Self-pay | Admitting: Internal Medicine

## 2014-05-28 ENCOUNTER — Ambulatory Visit (INDEPENDENT_AMBULATORY_CARE_PROVIDER_SITE_OTHER): Payer: Medicare Other | Admitting: Internal Medicine

## 2014-05-28 VITALS — BP 130/90 | HR 90 | Temp 98.1°F | Resp 16 | Ht 62.0 in | Wt 162.1 lb

## 2014-05-28 DIAGNOSIS — J449 Chronic obstructive pulmonary disease, unspecified: Secondary | ICD-10-CM

## 2014-05-28 DIAGNOSIS — R51 Headache: Secondary | ICD-10-CM

## 2014-05-28 DIAGNOSIS — R519 Headache, unspecified: Secondary | ICD-10-CM

## 2014-05-28 DIAGNOSIS — Z87891 Personal history of nicotine dependence: Secondary | ICD-10-CM

## 2014-05-28 LAB — CBC WITH DIFFERENTIAL/PLATELET
BASOS PCT: 0.3 % (ref 0.0–3.0)
Basophils Absolute: 0 10*3/uL (ref 0.0–0.1)
Eosinophils Absolute: 0.1 10*3/uL (ref 0.0–0.7)
Eosinophils Relative: 1.1 % (ref 0.0–5.0)
HCT: 39.2 % (ref 36.0–46.0)
Hemoglobin: 13.8 g/dL (ref 12.0–15.0)
LYMPHS PCT: 20.4 % (ref 12.0–46.0)
Lymphs Abs: 1.6 10*3/uL (ref 0.7–4.0)
MCHC: 35.3 g/dL (ref 30.0–36.0)
MCV: 94.1 fl (ref 78.0–100.0)
MONOS PCT: 9.2 % (ref 3.0–12.0)
Monocytes Absolute: 0.7 10*3/uL (ref 0.1–1.0)
Neutro Abs: 5.4 10*3/uL (ref 1.4–7.7)
Neutrophils Relative %: 69 % (ref 43.0–77.0)
Platelets: 245 10*3/uL (ref 150.0–400.0)
RBC: 4.17 Mil/uL (ref 3.87–5.11)
RDW: 13.1 % (ref 11.5–15.5)
WBC: 7.8 10*3/uL (ref 4.0–10.5)

## 2014-05-28 LAB — FERRITIN: FERRITIN: 83.2 ng/mL (ref 10.0–291.0)

## 2014-05-28 MED ORDER — PREDNISONE 20 MG PO TABS: 20.0000 mg | ORAL_TABLET | Freq: Two times a day (BID) | ORAL | Status: DC

## 2014-05-28 MED ORDER — ALBUTEROL SULFATE (2.5 MG/3ML) 0.083% IN NEBU: 2.5000 mg | INHALATION_SOLUTION | Freq: Four times a day (QID) | RESPIRATORY_TRACT | Status: DC | PRN

## 2014-05-28 MED ORDER — DOXYCYCLINE HYCLATE 100 MG PO TABS: 100.0000 mg | ORAL_TABLET | Freq: Two times a day (BID) | ORAL | Status: DC

## 2014-05-28 NOTE — Progress Notes (Signed)
   Subjective:    Patient ID: [name] [name], female    DOB: 1945/09/25, 69 y.o.   MRN: [id number]  HPI She describes cough intermittently since March 2015. It does improve with antibiotics but recurs when she stops them in spite of  the fact she quit smoking in early February. It is associated with rhinitis with clear drainage, head congestion, chest congestion, chills, and sweats. The cough is productive of yellow phlegm; she also has associated wheezing. She's been using her inhaler.  She has some extrinsic symptoms of itchy, watery eyes, and sneezing. She describes eyes as "sticky, as if sand in them".She does describe perennial allergies. She previously was on allergy shots from [facility].  No significant GERD symptoms with PPI therapy.    Review of Systems She has pain in the right frontal area only with cough. She denies facial pain, nasal purulence, sore throat, earache, otic discharge.   Unexplained weight loss, abdominal pain, significant dyspepsia, dysphagia, melena, or rectal bleeding are denied.  She is requesting that her hemachromatosis be monitored here. She sees the hematologist every 6 months with CBC and differential and ferritin. Last labs were in September. I recommend she continue to be seen in Hematology for optimal care.      Objective:   Physical Exam  Pertinent or positive findings include: She has a course tremor of her head.  The nasal septum is deviated to the right and is erythematous.  She has complete dentures.  An S4 gallop is noted.  Breath sounds are decreased; but she has some rhonchi in the right lower lobe.  Cough is paroxysmal and dry.   General appearance :adequately nourished; in no distress. Eyes: No conjunctival inflammation or scleral icterus is present. Oral exam:  Lips and gums are healthy appearing.There is no oropharyngeal erythema or exudate noted. Heart:Regular rhythm. S1 and S2 normal without  murmur, click, rub or other extra  sounds  Lungs:No wheezes, rales ,or rubs present.No increased work of breathing.  Abdomen: bowel sounds normal, soft and non-tender without masses, organomegaly or hernias noted.  No guarding or rebound.  Vascular : all pulses equal ; no bruits present. Skin:Warm & dry.  Intact without suspicious lesions or rashes ; no tenting or jaundice  Lymphatic: No lymphadenopathy is noted about the head, neck, axilla Neuro: Strength, tone normal.        Assessment & Plan:  #1 chronic asthmatic bronchitis  #2 history of perennial allergies  #3 hemachromatosis, followed by Hematology  Plan: Chest x-ray, sinus films, Doxycycline, short course Prednisone and sample of Symbicort. If these are nonrevealing and symptoms persist; reevaluation by Allergist is indicated

## 2014-05-28 NOTE — Progress Notes (Signed)
Pre visit review using our clinic review tool, if applicable. No additional management support is needed unless otherwise documented below in the visit note. 

## 2014-05-28 NOTE — Patient Instructions (Addendum)
  Your next office appointment will be determined based upon review of your pending labs &  x-rays. Those instructions will be transmitted to you through My Chart   Critical values will be called. Followup as needed for any active or acute issue. Please report any significant change in your symptoms.  Symbicort 160/4.5 one - two inhalations every 12 hours; gargle and spit after use (lot :[id number]; Exp 04/2015)

## 2014-05-30 ENCOUNTER — Telehealth: Payer: Self-pay | Admitting: *Deleted

## 2014-05-30 NOTE — Telephone Encounter (Signed)
[name] [name], [place], RN            Your correct should be colon/EGD unless PPI helped her dysphagia according to his office note.       Previous Messages     ----- Message -----   From: Laverna Peace, RN   Sent: 05/30/2014  1:30 PM    To: [name] [name], CMA   Hey PJ,   I just wanted to clarify. Is this pt supposed to be an Endo/Colon or just a colonoscopy? She had a visit with Dr. Carlean Purl 02-12-14 and only scheduled for a colonoscopy on 06-18-14. I just wanted to clarify that she isn't supposed to have both procedures.   Thanks,  Cyril Mourning                Thanks for trying to call her, let's hope her swallowing is better. Dr. Carlean Purl is back 06/03/14 and we can get him to advise if she needs a double that day.       Previous Messages     ----- Message -----   From: Laverna Peace, RN   Sent: 05/30/2014  3:13 PM    To: [name] [name], CMA   Hey PJ,  I did try to call the pt to check on her swallowing but got no answer at either numbers available

## 2014-05-31 NOTE — Telephone Encounter (Signed)
Tried to reach pt on her cell number, no answer. Unable to leave message.Will call later to check on her dysphagia.

## 2014-06-04 ENCOUNTER — Ambulatory Visit (AMBULATORY_SURGERY_CENTER): Payer: Self-pay | Admitting: *Deleted

## 2014-06-04 VITALS — Ht 63.0 in | Wt 163.4 lb

## 2014-06-04 DIAGNOSIS — K529 Noninfective gastroenteritis and colitis, unspecified: Secondary | ICD-10-CM

## 2014-06-04 DIAGNOSIS — R1314 Dysphagia, pharyngoesophageal phase: Secondary | ICD-10-CM

## 2014-06-04 NOTE — Progress Notes (Signed)
Denies allergies to eggs or soy products. Denies complications with sedation or anesthesia. Denies O2 use. Denies use of diet or weight loss medications.  Emmi instructions given for colonoscopy and endoscopy.  

## 2014-06-07 ENCOUNTER — Telehealth: Payer: Self-pay | Admitting: Hematology and Oncology

## 2014-06-07 ENCOUNTER — Ambulatory Visit (HOSPITAL_BASED_OUTPATIENT_CLINIC_OR_DEPARTMENT_OTHER): Payer: Medicare Other | Admitting: Hematology and Oncology

## 2014-06-07 ENCOUNTER — Encounter: Payer: Self-pay | Admitting: Hematology and Oncology

## 2014-06-07 ENCOUNTER — Ambulatory Visit (HOSPITAL_BASED_OUTPATIENT_CLINIC_OR_DEPARTMENT_OTHER): Payer: Medicare Other

## 2014-06-07 NOTE — Patient Instructions (Signed)

## 2014-06-07 NOTE — Assessment & Plan Note (Signed)
She does not need frequent phlebotomy.  I'm concerned about risk of leg cramps and pica if we get the ferritin level too low. I recommend setting at target goal at 75 and she agree. We will proceed with 1 unit of blood removed today.

## 2014-06-07 NOTE — Progress Notes (Signed)
Phlebotomy performed via right antecubital, but clotted after obtaining 20 cc. Second attempt via left antecubital with total of 500 cc blood obtained, patient tolerated procedure well.

## 2014-06-07 NOTE — Telephone Encounter (Signed)
gv adn printed appt sched and avs for pt for March 2017...sed added tx.

## 2014-06-07 NOTE — Progress Notes (Signed)
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 She was placed phlebotomy periodically to keep ferritin under 50. INTERVAL HISTORY: [name] [name] 69 y.o. female returns for  Further follow-up. Last year, after pneumococcal vaccination, she say she has significant reaction with left arm swelling and severe pain. She also continues to have flu-like illness and recurrent cough and she blamed the pneumococcal vaccine for her symptoms.  She quit smoking recently.  I have reviewed the past medical history, past surgical history, social history and family history with the patient and they are unchanged from previous note.  ALLERGIES:  is allergic to ciprofloxacin; codeine; macrobid; morphine and related; and prevnar.  MEDICATIONS:  Current Outpatient Prescriptions  Medication Sig Dispense Refill  . albuterol (PROVENTIL) (2.5 MG/3ML) 0.083% nebulizer solution Take 3 mLs (2.5 mg total) by nebulization every 6 (six) hours as needed for wheezing or shortness of breath. 75 mL 2  . aspirin EC 81 MG tablet Take 1 tablet (81 mg total) by mouth daily. 150 tablet 2  . bisacodyl (DULCOLAX) 5 MG EC tablet Take 5 mg by mouth daily as needed for moderate constipation. One time use for colonoscopy    . diazepam (VALIUM) 5 MG tablet TAKE ONE-HALF TO ONE TABLET BY MOUTH EVERY 12 HOURS AS NEEDED 60 tablet 5  . doxycycline (VIBRA-TABS) 100 MG tablet Take 1 tablet (100 mg total) by mouth 2 (two) times daily. 20 tablet 0  . hydrochlorothiazide (MICROZIDE) 12.5 MG capsule Take 1 capsule (12.5 mg total) by mouth every morning. 90 capsule 1  . KLOR-CON M10 10 MEQ tablet TAKE ONE TABLET BY MOUTH TWICE DAILY 180 tablet 0  . omeprazole (PRILOSEC) 20 MG capsule Take 20 mg by mouth daily.    . polyethylene  glycol powder (GLYCOLAX/MIRALAX) powder Take 1 Container by mouth once. One time dose for colonoscopy    . Probiotic Product (PROBIOTIC DAILY PO) Take by mouth.    . [DISCONTINUED] potassium chloride (KLOR-CON) 10 MEQ CR tablet Take 1 tablet (10 mEq total) by mouth daily. 30 tablet 5   No current facility-administered medications for this visit.     REVIEW OF SYSTEMS:   Constitutional: Denies fevers, chills or night sweats Eyes: Denies blurriness of vision Ears, nose, mouth, throat, and face: Denies mucositis or sore throat Respiratory: Denies cough, dyspnea or wheezes Cardiovascular: Denies palpitation, chest discomfort or lower extremity swelling Gastrointestinal:  Denies nausea, heartburn or change in bowel habits Skin: Denies abnormal skin rashes Lymphatics: Denies new lymphadenopathy or easy bruising Neurological:Denies numbness, tingling or new weaknesses Behavioral/Psych: Mood is stable, no new changes  All other systems were reviewed with the patient and are negative.  PHYSICAL EXAMINATION: ECOG PERFORMANCE STATUS: 0 - Asymptomatic  Filed Vitals:   06/07/14 1456  BP: 124/74  Pulse: 101  Temp: 98.2 F (36.8 C)  Resp: 18   Filed Weights   06/07/14 1456  Weight: 164 lb 9.6 oz (74.662 kg)    GENERAL:alert, no distress and comfortable SKIN: skin color, texture, turgor are normal, no rashes or significant lesions EYES: normal, Conjunctiva are pink and non-injected, sclera clear Musculoskeletal:no cyanosis of digits and no clubbing  NEURO: alert & oriented x 3 with fluent speech, no focal motor/sensory deficits  LABORATORY DATA:  I have reviewed the data as listed No results found for this or any previous  visit (from the past 48 hour(s)).  Lab Results  Component Value Date   WBC 7.8 05/28/2014   HGB 13.8 05/28/2014   HCT 39.2 05/28/2014   MCV 94.1 05/28/2014   PLT 245.0 05/28/2014    ASSESSMENT & PLAN:  Hereditary hemochromatosis  She does not need frequent  phlebotomy.  I'm concerned about risk of leg cramps and pica if we get the ferritin level too low. I recommend setting at target goal at 75 and she agree. We will proceed with 1 unit of blood removed today.    All questions were answered. The patient knows to call the clinic with any problems, questions or concerns. No barriers to learning was detected.  I spent 15 minutes counseling the patient face to face. The total time spent in the appointment was 20 minutes and more than 50% was on counseling.     [facility], [name] Mirkin, MD 3/11/20163:34 PM

## 2014-06-18 ENCOUNTER — Encounter: Payer: Medicare Other | Admitting: Internal Medicine

## 2014-07-05 ENCOUNTER — Ambulatory Visit: Payer: Medicare Other | Admitting: Hematology and Oncology

## 2014-07-05 ENCOUNTER — Other Ambulatory Visit (INDEPENDENT_AMBULATORY_CARE_PROVIDER_SITE_OTHER): Payer: Medicare Other

## 2014-07-05 ENCOUNTER — Ambulatory Visit (INDEPENDENT_AMBULATORY_CARE_PROVIDER_SITE_OTHER): Payer: Medicare Other | Admitting: Internal Medicine

## 2014-07-05 ENCOUNTER — Encounter: Payer: Self-pay | Admitting: Internal Medicine

## 2014-07-05 VITALS — BP 120/90 | HR 92 | Temp 97.8°F | Ht 63.0 in | Wt 163.8 lb

## 2014-07-05 DIAGNOSIS — R300 Dysuria: Secondary | ICD-10-CM

## 2014-07-05 DIAGNOSIS — N390 Urinary tract infection, site not specified: Secondary | ICD-10-CM

## 2014-07-05 LAB — URINALYSIS, ROUTINE W REFLEX MICROSCOPIC

## 2014-07-05 MED ORDER — SULFAMETHOXAZOLE-TRIMETHOPRIM 800-160 MG PO TABS: 1.0000 | ORAL_TABLET | Freq: Two times a day (BID) | ORAL | Status: DC

## 2014-07-05 NOTE — Patient Instructions (Signed)
Drink as much nondairy fluids as possible. Avoid spicy foods or alcohol as  these may aggravate the bladder. Do not take decongestants. Avoid narcotics if possible. 

## 2014-07-05 NOTE — Progress Notes (Signed)
Pre visit review using our clinic review tool, if applicable. No additional management support is needed unless otherwise documented below in the visit note. 
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
## 2014-07-07 LAB — URINE CULTURE

## 2014-07-16 ENCOUNTER — Ambulatory Visit (AMBULATORY_SURGERY_CENTER): Payer: Medicare Other | Admitting: Internal Medicine

## 2014-07-16 ENCOUNTER — Encounter: Payer: Self-pay | Admitting: Internal Medicine

## 2014-07-16 VITALS — BP 120/67 | HR 78 | Temp 97.9°F | Resp 23 | Ht 63.0 in | Wt 163.0 lb

## 2014-07-16 DIAGNOSIS — R1319 Other dysphagia: Secondary | ICD-10-CM

## 2014-07-16 DIAGNOSIS — K529 Noninfective gastroenteritis and colitis, unspecified: Secondary | ICD-10-CM | POA: Diagnosis not present

## 2014-07-16 DIAGNOSIS — K5520 Angiodysplasia of colon without hemorrhage: Secondary | ICD-10-CM

## 2014-07-16 DIAGNOSIS — D120 Benign neoplasm of cecum: Secondary | ICD-10-CM

## 2014-07-16 DIAGNOSIS — R1310 Dysphagia, unspecified: Secondary | ICD-10-CM

## 2014-07-16 DIAGNOSIS — R1314 Dysphagia, pharyngoesophageal phase: Secondary | ICD-10-CM

## 2014-07-16 HISTORY — DX: Angiodysplasia of colon without hemorrhage: K55.20

## 2014-07-16 MED ORDER — SODIUM CHLORIDE 0.9 % IV SOLN: 500.0000 mL | INTRAVENOUS | Status: DC

## 2014-07-16 NOTE — Progress Notes (Signed)
Patient awakening,vss,report to rn 

## 2014-07-16 NOTE — Op Note (Signed)
[place]  [address] [place], [address]   COLONOSCOPY PROCEDURE REPORT  PATIENT: [name] [name], [name] [name]  MR#: [id number] BIRTHDATE: 1946/01/03 , 68  yrs. old GENDER: female ENDOSCOPIST: Gatha Mayer, MD, [facility] PROCEDURE DATE:  07/16/2014 PROCEDURE:   Colonoscopy, diagnostic and Colonoscopy with biopsy First Screening Colonoscopy - Avg.  risk and is 50 yrs.  old or older - No.  Prior Negative Screening - Now for repeat screening. N/A  History of Adenoma - Now for follow-up colonoscopy & has been > or = to 3 yrs.  N/A ASA CLASS:   Class III INDICATIONS:Clinically significant diarrhea of unexplained origin and Patient is not applicable for Colorectal Neoplasm Risk Assessment for this procedure. MEDICATIONS: Propofol 100 mg IV, Monitored anesthesia care, and Residual sedation present  DESCRIPTION OF PROCEDURE:   After the risks benefits and alternatives of the procedure were thoroughly explained, informed consent was obtained.  The digital rectal exam revealed no abnormalities of the rectum.   The LB [id number]  endoscope was introduced through the anus and advanced to the terminal ileum which was intubated for a short distance. No adverse events experienced.   The quality of the prep was (MiraLax was used) excellent.  The instrument was then slowly withdrawn as the colon was fully examined.  COLON FINDINGS: The examined terminal ileum appeared to be normal. A sessile polyp ranging from 1 to 80mm in size was found at the cecum.  A polypectomy was performed with cold forceps.  The resection was complete, the polyp tissue was completely retrieved and sent to histology.  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 The colonic mucosa appeared normal throughout the entire examined colon.  Multiple random biopsies were performed using cold forceps. Samples were sent to R/O microscopic colitis.  Retroflexed views revealed no abnormalities. The time to cecum = 2.1  Withdrawal time = 9.6   The scope was withdrawn and the procedure completed. COMPLICATIONS: There were no immediate complications.  ENDOSCOPIC IMPRESSION: 1.   The examined terminal ileum appeared to be normal 2.   Sessile polyp ranging from 1 to 19mm in size was found at the cecum; polypectomy was performed with cold forceps 3.   3 mm cecal AVM - non-bleeding 4.   The colonic mucosa appeared normal throughout the entire examined colon; multiple random biopsies were performed using cold forceps  RECOMMENDATIONS: 1.  Timing of repeat colonoscopy will be determined by pathology findings. 2.  Office will call with the results and recommendations  eSigned:  Gatha Mayer, MD, [facility] 07/16/2014 3:12 PM   cc: The Patient and Dr. [name]

## 2014-07-16 NOTE — Op Note (Signed)
[place]  [address]. [place], [address]   ENDOSCOPY PROCEDURE REPORT  PATIENT: [name] [name], [name] [name]  MR#: [id number] BIRTHDATE: 1945-11-04 , 68  yrs. old GENDER: female ENDOSCOPIST: Gatha Mayer, MD, [facility] PROCEDURE DATE:  07/16/2014 PROCEDURE:  EGD, diagnostic ASA CLASS: INDICATIONS:  dysphagia. MEDICATIONS: Propofol 150 mg IV and Monitored anesthesia care TOPICAL ANESTHETIC: none  DESCRIPTION OF PROCEDURE: After the risks benefits and alternatives of the procedure were thoroughly explained, informed consent was obtained.  The LB [id number] endoscope was introduced through the mouth and advanced to the second portion of the duodenum , Without limitations.  The instrument was slowly withdrawn as the mucosa was fully examined.    1) 3-4 cm hiatal hernia 2) Otherwise normal EGD.  Retroflexed views revealed as previously described.     The scope was then withdrawn from the patient and the procedure completed.  COMPLICATIONS: There were no immediate complications.  ENDOSCOPIC IMPRESSION: 1) 3-4 cm hiatal hernia 2) Otherwise normal EGD  RECOMMENDATIONS: Continue PPI  dysphagia has improved - she did not want an empiric dilation   eSigned:  Gatha Mayer, MD, [facility] 07/16/2014 3:05 PM    CC:The Patient and Dr. [name]

## 2014-07-16 NOTE — Progress Notes (Signed)
Called to room to assist during endoscopic procedure.  Patient ID and intended procedure confirmed with present staff. Received instructions for my participation in the procedure from the performing physician.  

## 2014-07-16 NOTE — Patient Instructions (Addendum)
There is a hiatal hernia but no narrow areas or stricture in the esophagus.  I found and removed and one tiny colon polyp and everything else looked ok in the colon. I took biopsies of the colon lining to try to understand why you have diarrhea.  We will call with results and recommendations.  I appreciate the opportunity to care for you. Gatha Mayer, MD, FACG  YOU HAD AN ENDOSCOPIC PROCEDURE TODAY AT [facility]:   Refer to the procedure report that was given to you for any specific questions about what was found during the examination.  If the procedure report does not answer your questions, please call your gastroenterologist to clarify.  If you requested that your care partner not be given the details of your procedure findings, then the procedure report has been included in a sealed envelope for you to review at your convenience later.  YOU SHOULD EXPECT: Some feelings of bloating in the abdomen. Passage of more gas than usual.  Walking can help get rid of the air that was put into your GI tract during the procedure and reduce the bloating. If you had a lower endoscopy (such as a colonoscopy or flexible sigmoidoscopy) you may notice spotting of blood in your stool or on the toilet paper. If you underwent a bowel prep for your procedure, you may not have a normal bowel movement for a few days.  Please Note:  You might notice some irritation and congestion in your nose or some drainage.  This is from the oxygen used during your procedure.  There is no need for concern and it should clear up in a day or so.  SYMPTOMS TO REPORT IMMEDIATELY:   Following lower endoscopy (colonoscopy or flexible sigmoidoscopy):  Excessive amounts of blood in the stool  Significant tenderness or worsening of abdominal pains  Swelling of the abdomen that is new, acute  Fever of 100F or higher   Following upper endoscopy (EGD)  Vomiting of blood or coffee ground material  New  chest pain or pain under the shoulder blades  Painful or persistently difficult swallowing  New shortness of breath  Fever of 100F or higher  Black, tarry-looking stools  For urgent or emergent issues, a gastroenterologist can be reached at any hour by calling [phone].   DIET: Your first meal following the procedure should be a small meal and then it is ok to progress to your normal diet. Heavy or fried foods are harder to digest and may make you feel nauseous or bloated.  Likewise, meals heavy in dairy and vegetables can increase bloating.  Drink plenty of fluids but you should avoid alcoholic beverages for 24 hours.  ACTIVITY:  You should plan to take it easy for the rest of today and you should NOT DRIVE or use heavy machinery until tomorrow (because of the sedation medicines used during the test).    FOLLOW UP: Our staff will call the number listed on your records the next business day following your procedure to check on you and address any questions or concerns that you may have regarding the information given to you following your procedure. If we do not reach you, we will leave a message.  However, if you are feeling well and you are not experiencing any problems, there is no need to return our call.  We will assume that you have returned to your regular daily activities without incident.  If any biopsies were taken you will  be contacted by phone or by letter within the next 1-3 weeks.  Please call us at [phone] if you have not heard about the biopsies in 3 weeks.    SIGNATURES/CONFIDENTIALITY: You and/or your care partner have signed paperwork which will be entered into your electronic medical record.  These signatures attest to the fact that that the information above on your After Visit Summary has been reviewed and is understood.  Full responsibility of the confidentiality of this discharge information lies with you and/or your care-partner.  Hiatal hernia and Polyp  information given.

## 2014-07-17 ENCOUNTER — Telehealth: Payer: Self-pay

## 2014-07-17 NOTE — Telephone Encounter (Signed)
  Follow up Call-  Call back number 07/16/2014  Post procedure Call Back phone  # [phone]  Permission to leave phone message Yes     Patient questions:  Do you have a fever, pain , or abdominal swelling? No. Pain Score  0 *  Have you tolerated food without any problems? Yes.    Have you been able to return to your normal activities? Yes.    Do you have any questions about your discharge instructions: Diet   No. Medications  No. Follow up visit  No.  Do you have questions or concerns about your Care? No.  Actions: * If pain score is 4 or above: No action needed, pain <4.

## 2014-07-25 ENCOUNTER — Encounter: Payer: Self-pay | Admitting: Internal Medicine

## 2014-07-25 DIAGNOSIS — Z86010 Personal history of colonic polyps: Secondary | ICD-10-CM

## 2014-07-25 DIAGNOSIS — Z860101 Personal history of adenomatous and serrated colon polyps: Secondary | ICD-10-CM | POA: Insufficient documentation

## 2014-07-25 HISTORY — DX: Personal history of adenomatous and serrated colon polyps: Z86.0101

## 2014-07-25 HISTORY — DX: Personal history of colonic polyps: Z86.010

## 2014-07-25 NOTE — Progress Notes (Signed)
Quick Note:  Please call 1) benign polyp - will need repeat colonoscopy 7 years 2023 2) colon biopsies normal 3) I think she has diarrhea from bile dripping down intestines after cholecystectomy 4) Start colestipol 5 g with supper - 1 can w/ 11 RF 5) If this does not work call us back an let us know  [place]   No letter Place a recall for 06/2021 ______

## 2014-07-26 ENCOUNTER — Other Ambulatory Visit: Payer: Self-pay

## 2014-07-26 MED ORDER — COLESTIPOL HCL 5 G PO GRAN: 5.0000 g | GRANULES | Freq: Every day | ORAL | Status: DC

## 2014-08-16 ENCOUNTER — Telehealth: Payer: Self-pay | Admitting: Internal Medicine

## 2014-08-16 DIAGNOSIS — K529 Noninfective gastroenteritis and colitis, unspecified: Secondary | ICD-10-CM

## 2014-08-16 DIAGNOSIS — M791 Myalgia, unspecified site: Secondary | ICD-10-CM

## 2014-08-16 NOTE — Telephone Encounter (Signed)
Patient reports that she is not able to take the colestipol.  She has had problems in the past with cholesterol medications.  She has tried it and it causes joint and muscle pain and stiffness.  Dr. Carlean Purl please advise

## 2014-08-19 ENCOUNTER — Other Ambulatory Visit: Payer: Self-pay | Admitting: Internal Medicine

## 2014-08-19 DIAGNOSIS — M791 Myalgia, unspecified site: Secondary | ICD-10-CM | POA: Insufficient documentation

## 2014-08-19 NOTE — Assessment & Plan Note (Signed)
Myalgia w/ Colestipol Will try 1/2 scoope Check labs

## 2014-08-19 NOTE — Telephone Encounter (Signed)
Got mm aches and fatigue on colestipol Better off Diarrhea back and seeing pills in her stool  She has tried 1/2 scoop of colestipol and thinks it helps some  1) she will try 1/2 scoop colestipol 2) will come for and CK tomorrow - I entered orders 3) will call results/plans   ? Lotronex

## 2014-08-20 ENCOUNTER — Other Ambulatory Visit (INDEPENDENT_AMBULATORY_CARE_PROVIDER_SITE_OTHER): Payer: Medicare Other

## 2014-08-20 DIAGNOSIS — M791 Myalgia, unspecified site: Secondary | ICD-10-CM

## 2014-08-20 DIAGNOSIS — K529 Noninfective gastroenteritis and colitis, unspecified: Secondary | ICD-10-CM | POA: Diagnosis not present

## 2014-08-20 LAB — COMPREHENSIVE METABOLIC PANEL
ALT: 22 U/L (ref 0–35)
AST: 15 U/L (ref 0–37)
Albumin: 4 g/dL (ref 3.5–5.2)
Alkaline Phosphatase: 85 U/L (ref 39–117)
BUN: 16 mg/dL (ref 6–23)
CO2: 28 meq/L (ref 19–32)
CREATININE: 1.1 mg/dL (ref 0.40–1.20)
Calcium: 9.7 mg/dL (ref 8.4–10.5)
Chloride: 104 mEq/L (ref 96–112)
GFR: 52.38 mL/min — ABNORMAL LOW (ref >60.00)
Glucose, Bld: 110 mg/dL — ABNORMAL HIGH (ref 70–99)
Potassium: 3.2 mEq/L — ABNORMAL LOW (ref 3.5–5.1)
Sodium: 138 mEq/L (ref 135–145)
TOTAL PROTEIN: 6.4 g/dL (ref 6.0–8.3)
Total Bilirubin: 0.3 mg/dL (ref 0.2–1.2)

## 2014-08-20 LAB — CK: Total CK: 84 U/L (ref 7–177)

## 2014-08-20 NOTE — Progress Notes (Signed)
Quick Note:  I called her and reviewed labs - she needs to take 2 KCL bid x next week Will stop colestipol as it may have caused muscle aches Use loperamide 1-2 every other day or every third day Need to find a spot to get her in so I can review possible Lotronex Tx She could have arthritis - I suggested she see PCP Please call her and review all this as she was breaking up on her cell when we spoke Let's talk about appt also ______

## 2014-08-24 ENCOUNTER — Observation Stay (HOSPITAL_COMMUNITY)
Admission: EM | Admit: 2014-08-24 | Discharge: 2014-08-25 | Disposition: A | Discharge status: Home / Self Care | Payer: Medicare Other | Attending: Internal Medicine | Admitting: Internal Medicine

## 2014-08-24 ENCOUNTER — Emergency Department (HOSPITAL_COMMUNITY): Payer: Medicare Other

## 2014-08-24 ENCOUNTER — Telehealth: Payer: Self-pay | Admitting: Internal Medicine

## 2014-08-24 ENCOUNTER — Encounter (HOSPITAL_COMMUNITY): Payer: Self-pay | Admitting: Emergency Medicine

## 2014-08-24 DIAGNOSIS — M791 Myalgia, unspecified site: Secondary | ICD-10-CM | POA: Diagnosis present

## 2014-08-24 DIAGNOSIS — I2510 Atherosclerotic heart disease of native coronary artery without angina pectoris: Secondary | ICD-10-CM | POA: Diagnosis not present

## 2014-08-24 DIAGNOSIS — R197 Diarrhea, unspecified: Secondary | ICD-10-CM | POA: Insufficient documentation

## 2014-08-24 DIAGNOSIS — Z8052 Family history of malignant neoplasm of bladder: Secondary | ICD-10-CM | POA: Diagnosis not present

## 2014-08-24 DIAGNOSIS — R079 Chest pain, unspecified: Secondary | ICD-10-CM | POA: Diagnosis not present

## 2014-08-24 DIAGNOSIS — F419 Anxiety disorder, unspecified: Secondary | ICD-10-CM | POA: Diagnosis not present

## 2014-08-24 DIAGNOSIS — E785 Hyperlipidemia, unspecified: Secondary | ICD-10-CM | POA: Insufficient documentation

## 2014-08-24 DIAGNOSIS — Z7982 Long term (current) use of aspirin: Secondary | ICD-10-CM | POA: Insufficient documentation

## 2014-08-24 DIAGNOSIS — Z803 Family history of malignant neoplasm of breast: Secondary | ICD-10-CM | POA: Insufficient documentation

## 2014-08-24 DIAGNOSIS — Z9689 Presence of other specified functional implants: Secondary | ICD-10-CM | POA: Diagnosis present

## 2014-08-24 DIAGNOSIS — I700 Atherosclerosis of aorta: Secondary | ICD-10-CM | POA: Diagnosis not present

## 2014-08-24 DIAGNOSIS — I10 Essential (primary) hypertension: Secondary | ICD-10-CM | POA: Diagnosis present

## 2014-08-24 DIAGNOSIS — N189 Chronic kidney disease, unspecified: Secondary | ICD-10-CM | POA: Diagnosis not present

## 2014-08-24 DIAGNOSIS — I129 Hypertensive chronic kidney disease with stage 1 through stage 4 chronic kidney disease, or unspecified chronic kidney disease: Secondary | ICD-10-CM | POA: Insufficient documentation

## 2014-08-24 DIAGNOSIS — F329 Major depressive disorder, single episode, unspecified: Secondary | ICD-10-CM | POA: Diagnosis not present

## 2014-08-24 DIAGNOSIS — J449 Chronic obstructive pulmonary disease, unspecified: Secondary | ICD-10-CM | POA: Diagnosis not present

## 2014-08-24 DIAGNOSIS — F17210 Nicotine dependence, cigarettes, uncomplicated: Secondary | ICD-10-CM | POA: Insufficient documentation

## 2014-08-24 DIAGNOSIS — K449 Diaphragmatic hernia without obstruction or gangrene: Secondary | ICD-10-CM | POA: Diagnosis not present

## 2014-08-24 DIAGNOSIS — J45909 Unspecified asthma, uncomplicated: Secondary | ICD-10-CM | POA: Insufficient documentation

## 2014-08-24 DIAGNOSIS — I25119 Atherosclerotic heart disease of native coronary artery with unspecified angina pectoris: Secondary | ICD-10-CM

## 2014-08-24 DIAGNOSIS — Z800 Family history of malignant neoplasm of digestive organs: Secondary | ICD-10-CM | POA: Insufficient documentation

## 2014-08-24 DIAGNOSIS — K219 Gastro-esophageal reflux disease without esophagitis: Secondary | ICD-10-CM | POA: Diagnosis not present

## 2014-08-24 DIAGNOSIS — K7689 Other specified diseases of liver: Secondary | ICD-10-CM | POA: Diagnosis not present

## 2014-08-24 DIAGNOSIS — K529 Noninfective gastroenteritis and colitis, unspecified: Secondary | ICD-10-CM | POA: Diagnosis not present

## 2014-08-24 DIAGNOSIS — Z888 Allergy status to other drugs, medicaments and biological substances status: Secondary | ICD-10-CM | POA: Insufficient documentation

## 2014-08-24 DIAGNOSIS — Z881 Allergy status to other antibiotic agents status: Secondary | ICD-10-CM | POA: Insufficient documentation

## 2014-08-24 DIAGNOSIS — Z886 Allergy status to analgesic agent status: Secondary | ICD-10-CM | POA: Diagnosis not present

## 2014-08-24 DIAGNOSIS — Z885 Allergy status to narcotic agent status: Secondary | ICD-10-CM | POA: Diagnosis not present

## 2014-08-24 LAB — COMPREHENSIVE METABOLIC PANEL WITH GFR
ALT: 28 U/L (ref 14–54)
AST: 22 U/L (ref 15–41)
Albumin: 4 g/dL (ref 3.5–5.0)
Alkaline Phosphatase: 87 U/L (ref 38–126)
Anion gap: 9 (ref 5–15)
BUN: 11 mg/dL (ref 6–20)
CO2: 27 mmol/L (ref 22–32)
Calcium: 9.5 mg/dL (ref 8.9–10.3)
Chloride: 106 mmol/L (ref 101–111)
Creatinine, Ser: 0.79 mg/dL (ref 0.44–1.00)
GFR calc Af Amer: >60 mL/min
GFR calc non Af Amer: >60 mL/min
Glucose, Bld: 95 mg/dL (ref 65–99)
Potassium: 3.7 mmol/L (ref 3.5–5.1)
Sodium: 142 mmol/L (ref 135–145)
Total Bilirubin: 0.3 mg/dL (ref 0.3–1.2)
Total Protein: 7 g/dL (ref 6.5–8.1)

## 2014-08-24 LAB — APTT: APTT: 36 s (ref 24–37)

## 2014-08-24 LAB — CBC
HEMATOCRIT: 41.1 % (ref 36.0–46.0)
Hemoglobin: 14.1 g/dL (ref 12.0–15.0)
MCH: 33.3 pg (ref 26.0–34.0)
MCHC: 34.3 g/dL (ref 30.0–36.0)
MCV: 96.9 fL (ref 78.0–100.0)
Platelets: 224 10*3/uL (ref 150–400)
RBC: 4.24 MIL/uL (ref 3.87–5.11)
RDW: 12.4 % (ref 11.5–15.5)
WBC: 6.5 10*3/uL (ref 4.0–10.5)

## 2014-08-24 LAB — I-STAT TROPONIN, ED: Troponin i, poc: 0 ng/mL (ref 0.00–0.08)

## 2014-08-24 LAB — TROPONIN I: Troponin I: <0.03 ng/mL (ref <0.031)

## 2014-08-24 LAB — PROTIME-INR
INR: 1.04 (ref 0.00–1.49)
Prothrombin Time: 13.8 s (ref 11.6–15.2)

## 2014-08-24 MED ORDER — LIDOCAINE 5 % EX PTCH
1.0000 | MEDICATED_PATCH | CUTANEOUS | Status: DC
  Administered 2014-08-24: 1 via TRANSDERMAL
  Filled 2014-08-24 (×3): qty 1

## 2014-08-24 MED ORDER — PANTOPRAZOLE SODIUM 40 MG PO TBEC
40.0000 mg | DELAYED_RELEASE_TABLET | Freq: Every day | ORAL | Status: DC
  Administered 2014-08-24 – 2014-08-25 (×2): 40 mg via ORAL
  Filled 2014-08-24 (×2): qty 1

## 2014-08-24 MED ORDER — IOHEXOL 350 MG/ML SOLN
100.0000 mL | Freq: Once | INTRAVENOUS | Status: AC | PRN
  Administered 2014-08-24: 100 mL via INTRAVENOUS

## 2014-08-24 MED ORDER — SODIUM CHLORIDE 0.9 % IV SOLN
1000.0000 mL | INTRAVENOUS | Status: DC
  Administered 2014-08-24: 1000 mL via INTRAVENOUS

## 2014-08-24 MED ORDER — NITROGLYCERIN 2 % TD OINT
1.0000 [in_us] | TOPICAL_OINTMENT | Freq: Once | TRANSDERMAL | Status: DC
  Filled 2014-08-24: qty 30

## 2014-08-24 MED ORDER — ASPIRIN 81 MG PO CHEW
324.0000 mg | CHEWABLE_TABLET | Freq: Once | ORAL | Status: AC
  Administered 2014-08-24: 324 mg via ORAL
  Filled 2014-08-24: qty 4

## 2014-08-24 MED ORDER — ACETAMINOPHEN 325 MG PO TABS: 650.0000 mg | ORAL_TABLET | ORAL | Status: DC | PRN

## 2014-08-24 MED ORDER — ONDANSETRON HCL 4 MG/2ML IJ SOLN: 4.0000 mg | Freq: Four times a day (QID) | INTRAMUSCULAR | Status: DC | PRN

## 2014-08-24 MED ORDER — ENOXAPARIN SODIUM 40 MG/0.4ML ~~LOC~~ SOLN
40.0000 mg | SUBCUTANEOUS | Status: DC
  Administered 2014-08-24: 40 mg via SUBCUTANEOUS
  Filled 2014-08-24 (×2): qty 0.4

## 2014-08-24 MED ORDER — DIAZEPAM 5 MG PO TABS
5.0000 mg | ORAL_TABLET | Freq: Four times a day (QID) | ORAL | Status: DC | PRN
  Administered 2014-08-25: 5 mg via ORAL
  Filled 2014-08-24: qty 1

## 2014-08-24 MED ORDER — HYDROCHLOROTHIAZIDE 12.5 MG PO CAPS: 12.5000 mg | ORAL_CAPSULE | Freq: Every morning | ORAL | Status: DC

## 2014-08-24 MED ORDER — ASPIRIN EC 81 MG PO TBEC
81.0000 mg | DELAYED_RELEASE_TABLET | Freq: Every day | ORAL | Status: DC
  Administered 2014-08-25: 81 mg via ORAL
  Filled 2014-08-24: qty 1

## 2014-08-24 MED ORDER — ALBUTEROL SULFATE (2.5 MG/3ML) 0.083% IN NEBU: 2.5000 mg | INHALATION_SOLUTION | Freq: Four times a day (QID) | RESPIRATORY_TRACT | Status: DC | PRN

## 2014-08-24 MED ORDER — FENTANYL CITRATE (PF) 100 MCG/2ML IJ SOLN: 50.0000 ug | Freq: Once | INTRAMUSCULAR | Status: DC

## 2014-08-24 MED ORDER — NITROGLYCERIN 0.4 MG SL SUBL
0.4000 mg | SUBLINGUAL_TABLET | SUBLINGUAL | Status: DC | PRN
  Administered 2014-08-24: 0.4 mg via SUBLINGUAL
  Filled 2014-08-24 (×2): qty 1

## 2014-08-24 NOTE — ED Notes (Signed)
EDP Knapp at bedside. 

## 2014-08-24 NOTE — ED Notes (Signed)
Pt c/o sudden onset central chest pain that radiated into her back about 1 hour ago lasting approximately 15 minutes. Pt sts the chest pain has subsided but she continues to have the back pain. Pt sts this happened one other time before about three years ago. Pt was seen at ED and found her potassium was low. Pt A&Ox4 and ambulatory. Denies N/V, SOB, dizziness, lightheadedness.

## 2014-08-24 NOTE — ED Notes (Signed)
Nurse drawing labs. 

## 2014-08-24 NOTE — ED Notes (Signed)
MD Knapp at bedside 

## 2014-08-24 NOTE — ED Notes (Signed)
Patient transported to CT 

## 2014-08-24 NOTE — ED Provider Notes (Signed)
CSN: [id number]     Arrival date & time 08/24/14  1613 History   First MD Initiated Contact with Patient 08/24/14 1626     Chief Complaint  Patient presents with  . Chest Pain  . Back Pain      Patient is a 69 y.o. female presenting with chest pain. The history is provided by the patient.  Chest Pain Chest pain location: posterior right chest  Pain quality: sharp   Radiates to: radiates to the front. Pain severity:  Severe Onset quality:  Sudden Duration:  15 minutes Timing:  Constant Progression:  Partially resolved Chronicity:  New Context: at rest   Relieved by:  Nothing Worsened by:  Nothing tried Associated symptoms: no abdominal pain, no cough, no fever, no nausea, no shortness of breath and not vomiting   Risk factors: coronary artery disease, high cholesterol, hypertension and smoking   Risk factors: no prior DVT/PE   Pt has history of a small blockages per a cardiac cath.  She has been treated medically.  This feels similar to that but does not remember it has severe.  No history of stents.  Past Medical History  Diagnosis Date  . Hereditary hemochromatosis 2004  . ASTHMA   . HYPERTENSION   . GERD   . Chronic kidney disease   . Arthritis     left shoulder   . Headache(784.0)     sinus headaches   . H/O hiatal hernia   . Anxiety   . Hyperlipidemia   . CAD (coronary artery disease)   . Depression   . Hypertension   . Urinary tract infection   . COPD (chronic obstructive pulmonary disease) with emphysema     mild dz on PFTs 11/2013  . 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 Hx of adenomatous polyp of colon 07/25/2014   Past Surgical History  Procedure Laterality Date  . Cholecystectomy  03/29/09  . Appendectomy  03/29/81  . Abdominal hysterectomy  03/29/81  . Bladder tack  03/29/2002  . Other surgical history      cyst removed from intestines to ovary    . Other surgical  history      tendon surgery left wrist   . Cystoscopy  01/24/14    NEGATIVE;Dr Ottelin   Family History  Problem Relation Age of Onset  . Hypertension Mother   . Heart disease Mother   . Breast cancer Mother 22  . Diabetes Father   . Heart disease Father   . Alcohol abuse Other   . Arthritis Other   . Cirrhosis Brother   . Cirrhosis Brother   . Cirrhosis Brother   . Lung cancer Brother   . Colon cancer Neg Hx   . Colon polyps Neg Hx   . Esophageal cancer Neg Hx   . Rectal cancer Neg Hx   . Stomach cancer Neg Hx    History  Substance Use Topics  . Smoking status: Former Smoker -- 0.50 packs/day for 50 years    Types: Cigarettes    Quit date: 04/30/2014  . Smokeless tobacco: Never Used     Comment: widowed summer 2012, lives alone. Retired from work in [employer]  . Alcohol Use: No   OB History    No data available     Review of Systems  Constitutional: Negative for fever.  Respiratory: Negative for cough and shortness of breath.   Cardiovascular: Positive for chest pain.  Gastrointestinal: Positive for diarrhea (going on this past week). Negative for nausea, vomiting and abdominal pain.  All other systems reviewed and are negative.     Allergies  Ciprofloxacin; Codeine; Macrobid; Morphine and related; and Prevnar  Home Medications   Prior to Admission medications   Medication Sig Start Date End Date Taking? Authorizing Provider  albuterol (PROVENTIL) (2.5 MG/3ML) 0.083% nebulizer solution Take 3 mLs (2.5 mg total) by nebulization every 6 (six) hours as needed for wheezing or shortness of breath. 05/28/14   Hendricks Limes, MD  aspirin EC 81 MG tablet Take 1 tablet (81 mg total) by mouth daily. 08/04/11   Lelon Perla, MD  diazepam (VALIUM) 5 MG tablet TAKE ONE-HALF TO ONE TABLET BY MOUTH EVERY 12 HOURS AS NEEDED 10/24/13   Rowe Clack, MD  hydrochlorothiazide (MICROZIDE) 12.5 MG capsule Take 1 capsule (12.5 mg total) by mouth every morning. 02/25/14    Rowe Clack, MD  KLOR-CON M10 10 MEQ tablet TAKE ONE TABLET BY MOUTH TWICE DAILY 02/08/14   Rowe Clack, MD  omeprazole (PRILOSEC) 20 MG capsule Take 20 mg by mouth daily.    Historical Provider, MD  Probiotic Product (PROBIOTIC DAILY PO) Take by mouth.    Historical Provider, MD   BP 144/66 mmHg  Temp(Src) 99.2 F (37.3 C) (Oral)  Resp 20  SpO2 100% Physical Exam  Constitutional: She appears well-developed and well-nourished. No distress.  HENT:  Head: Normocephalic and atraumatic.  Right Ear: External ear normal.  Left Ear: External ear normal.  Eyes: Conjunctivae are normal. Right eye exhibits no discharge. Left eye exhibits no discharge. No scleral icterus.  Neck: Neck supple. No tracheal deviation present.  Cardiovascular: Normal rate, regular rhythm and intact distal pulses.   Pulmonary/Chest: Effort normal and breath sounds normal. No stridor. No respiratory distress. She has no wheezes. She has no rales.  Abdominal: Soft. Bowel sounds are normal. She exhibits no distension. There is no tenderness. There is no rebound and no guarding.  Musculoskeletal: She exhibits no edema or tenderness.  Neurological: She is alert. She has normal strength. No cranial nerve deficit (no facial droop, extraocular movements intact, no slurred speech) or sensory deficit. She exhibits normal muscle tone. She displays no seizure activity. Coordination normal.  Skin: Skin is warm and dry. No rash noted.  Psychiatric: She has a normal mood and affect.  Nursing note and vitals reviewed.   ED Course  Procedures (including critical care time) Labs Review Labs Reviewed  APTT  CBC  COMPREHENSIVE METABOLIC PANEL  PROTIME-INR  I-STAT TROPOININ, ED  I-STAT TROPOININ, ED    Imaging Review Ct Angio Chest Pe W/cm &/or Wo Cm  08/24/2014   CLINICAL DATA:  Chest pain with sudden onset, back pain  EXAM: CT ANGIOGRAPHY CHEST WITH CONTRAST  TECHNIQUE: Multidetector CT imaging of the chest was  performed using the standard protocol during bolus administration of intravenous contrast. Multiplanar CT image reconstructions and MIPs were obtained to evaluate the vascular anatomy.  CONTRAST:  171mL OMNIPAQUE IOHEXOL 350 MG/ML SOLN  COMPARISON:  None.  FINDINGS: Atherosclerotic calcifications of thoracic aorta. Heart size within normal limits. Small hiatal hernia. Visualized upper abdomen shows no adrenal gland mass. Scattered hepatic cysts the largest in right hepatic lobe anteriorly measures 2 cm.  Sagittal images of the spine shows mild degenerative changes thoracic spine.  There is no mediastinal hematoma or adenopathy. The study is of excellent technical quality. Mild perihilar peribronchial thickening. No pulmonary embolus is noted.  Images of the lung parenchyma shows no acute infiltrate or pulmonary edema. There is no axillary adenopathy. No destructive bony lesions are noted.  Central airways are patent.  Review of the MIP images confirms the above findings.  IMPRESSION: 1. No pulmonary embolus is noted. 2. No acute infiltrate or pulmonary edema. 3. Small hiatal hernia. 4. Atherosclerotic calcifications of thoracic aorta. 5. Scattered hepatic cysts.   Electronically Signed   By: Lahoma Crocker M.D.   On: 08/24/2014 19:20   Dg Chest Portable 1 View  08/24/2014   CLINICAL DATA:  Substernal chest pain x1 day  EXAM: PORTABLE CHEST - 1 VIEW  COMPARISON:  05/28/2014  FINDINGS: Lungs are clear.  No pleural effusion or pneumothorax.  The heart is normal in size.  Visualized osseous structures are within normal limits.  IMPRESSION: No evidence of acute cardiopulmonary disease.   Electronically Signed   By: Julian Hy M.D.   On: 08/24/2014 16:54     EKG Interpretation   Date/Time:  Saturday Aug 24 2014 16:19:30 EDT Ventricular Rate:  109 PR Interval:  163 QRS Duration: 87 QT Interval:  428 QTC Calculation: 576 R Axis:   62 Text Interpretation:  Sinus tachycardia Multiple ventricular premature   complexes, new since last tracing Low voltage, precordial leads  Nonspecific repol abnormality, diffuse leads, new since lat tracing  Prolonged QT interval Baseline wander in lead(s) II III aVF baseline  artifact compared to prior ECG Confirmed by Chamara Dyck  MD-J, Bronnie Vasseur ([id number]) on  08/24/2014 4:27:50 PM     Medications  nitroGLYCERIN (NITROSTAT) SL tablet 0.4 mg (0.4 mg Sublingual Given 08/24/14 1656)  nitroGLYCERIN (NITROGLYN) 2 % ointment 1 inch (1 inch Topical Not Given 08/24/14 1907)  0.9 %  sodium chloride infusion (1,000 mLs Intravenous New Bag/Given 08/24/14 1845)  fentaNYL (SUBLIMAZE) injection 50 mcg (50 mcg Intravenous Not Given 08/24/14 1845)  aspirin chewable tablet 324 mg (324 mg Oral Given 08/24/14 1655)  iohexol (OMNIPAQUE) 350 MG/ML injection 100 mL (100 mLs Intravenous Contrast Given 08/24/14 1856)    MDM   Final diagnoses:  Chest pain, unspecified chest pain type  Coronary artery disease involving native coronary artery of native heart with angina pectoris   2006  Pt states she is pain free.  Fentanyl ordered for her back discomfort previously but patient refused.   Patient's labs are reassuring. CT scan does not show any evidence of aneurysm, dissection or pulmonary embolism.  Pt does have of CAD based on a cardiac ct scan.  Pt is being medically managed.  Heart score of 6.  Plan on medical admission serial enzymes.    Dorie Rank, MD 08/24/14 2007

## 2014-08-24 NOTE — ED Notes (Signed)
MD Posey Pronto would like to DC i-stat troponin and run Troponin I in main lab.

## 2014-08-24 NOTE — ED Notes (Signed)
Note:  2nd NTG and nitro paste held d/t s.b.p. Of 112.  Pt. Is in no distress visiting with her daughters.

## 2014-08-24 NOTE — ED Notes (Signed)
Pt alert and oriented upon shift assessment. She reports pain 2/10 right sided chest pain that is dull in character. She denies nausea. Family at bedside.

## 2014-08-24 NOTE — ED Notes (Signed)
Went to update vitals- patient not in room

## 2014-08-24 NOTE — Telephone Encounter (Signed)
Pt called on-call MD today at 15:25 Seen for diarrhea Started with back pain and now radiating around to the chest.  Located in right back.  Started yesterday, but pain "hit this afternoon strong".  Radiating under right breast and to center of her chest.  Chest pain eased off, back still hurting Has had this pain before, 2 1/2 yrs ago, also during time that K was low Low K earlier this week (3.2), she has be doubling up on on K supplements Still having diarrhea and can see pills in stool, worried about absorption of K  Given chest and back pain, along with question of hypokalemia, I recommended she be evaluated in the ER or urgent care. She reports she normally comes to [facility] and so she is planning to be evaluated. EGD/colon reviewed, s/p cholecystectomy.  No recent endoscopic findings to explain her pain. Further eval and management per ED provider

## 2014-08-24 NOTE — ED Notes (Signed)
edp in with pt 

## 2014-08-24 NOTE — H&P (Signed)
[facility] History and Physical  Patient: [name] [name]  MRN: [id number]  DOB: 11-25-1945  DOS: the patient was seen and examined on 08/24/2014 PCP: [name], MD  Referring physician: DR Tomi Bamberger Chief Complaint: chest pain  HPI: [name] [name] is a 70 y.o. female with Past medical history of hypertension, GERD, chronic diarrhea, history of mild COPD, coronary artery disease. The patient is presenting with complaints of chest pain. The pain started today when she was sitting in a chair and started in the right side of the back and then radiated across to reach the center of the chest and felt like sharp pressure. The pain was later on subsided in the center of the chest and located in the posterior subscapular area and felt like a constant pain. Initially the pain was 10 out of 10 at the time of my evaluation patient continues to have significant pain in the back although unable to quantify. She denies any fever or chills denies any cough denies any shortness of breath nausea or vomiting. She denies any abdominal pain. She has chronic diarrhea which is unchanged. She denies any blood in her urine or burning urination. She denies any recent hospitalization surgeries or recent changes in her medications. She claims she is compliant with all her medications. She does this was similar pain when she had her earlier cardiac CAT scan.  The patient is coming from home. And at her baseline independent for most of her ADL.  Review of Systems: as mentioned in the history of present illness.  A comprehensive review of the other systems is negative.  Past Medical History  Diagnosis Date  . Hereditary hemochromatosis 2004  . ASTHMA   . HYPERTENSION   . GERD   . Chronic kidney disease   . Arthritis     left shoulder   . Headache(784.0)     sinus headaches   . H/O hiatal hernia   . Anxiety   . Hyperlipidemia   . CAD (coronary artery disease)   . Depression   . Hypertension    . Urinary tract infection   . COPD (chronic obstructive pulmonary disease) with emphysema     mild dz on PFTs 11/2013  . 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 Hx of adenomatous polyp of colon 07/25/2014   Past Surgical History  Procedure Laterality Date  . Cholecystectomy  03/29/09  . Appendectomy  03/29/81  . Abdominal hysterectomy  03/29/81  . Bladder tack  03/29/2002  . Other surgical history      cyst removed from intestines to ovary    . Other surgical history      tendon surgery left wrist   . Cystoscopy  01/24/14    NEGATIVE;Dr Ottelin   Social History:  reports that she has been smoking Cigarettes.  She has a 16.5 pack-year smoking history. She has never used smokeless tobacco. She reports that she does not drink alcohol or use illicit drugs.  Allergies  Allergen Reactions  . Ciprofloxacin Hives    Blisters on legs  . Codeine Nausea And Vomiting    Sweating, "passes out"  . Macrobid [Nitrofurantoin Monohyd Macro]     Stool incontinence  . Morphine And Related     "hospital bed was shaking"  . Prevnar [Pneumococcal 13-Val Conj Vacc] Palpitations    Pt c/o redness, swelling on arm after shot.  Pt c/o having respiratory problems and coughing ever since she got  it.     Family History  Problem Relation Age of Onset  . Hypertension Mother   . Heart disease Mother   . Breast cancer Mother 47  . Diabetes Father   . Heart disease Father   . Alcohol abuse Other   . Arthritis Other   . Cirrhosis Brother   . Cirrhosis Brother   . Cirrhosis Brother   . Lung cancer Brother   . Colon cancer Neg Hx   . Colon polyps Neg Hx   . Esophageal cancer Neg Hx   . Rectal cancer Neg Hx   . Stomach cancer Neg Hx     Prior to Admission medications   Medication Sig Start Date End Date Taking? Authorizing Provider  albuterol (PROVENTIL) (2.5 MG/3ML) 0.083% nebulizer solution Take 3 mLs (2.5 mg total) by  nebulization every 6 (six) hours as needed for wheezing or shortness of breath. 05/28/14  Yes Hendricks Limes, MD  aspirin EC 81 MG tablet Take 1 tablet (81 mg total) by mouth daily. 08/04/11  Yes Lelon Perla, MD  colestipol (COLESTID) 5 G granules Take 5 g by mouth daily as needed (diarrhea).  07/26/14  Yes Historical Provider, MD  diazepam (VALIUM) 5 MG tablet TAKE ONE-HALF TO ONE TABLET BY MOUTH EVERY 12 HOURS AS NEEDED Patient taking differently: TAKE 2.5-5MG  BY MOUTH EVERY 12 HOURS AS NEEDED 10/24/13  Yes Rowe Clack, MD  hydrochlorothiazide (MICROZIDE) 12.5 MG capsule Take 1 capsule (12.5 mg total) by mouth every morning. 02/25/14  Yes Rowe Clack, MD  KLOR-CON M10 10 MEQ tablet TAKE ONE TABLET BY MOUTH TWICE DAILY Patient taking differently: TAKE 10 MEQ BY MOUTH TWICE DAILY 02/08/14  Yes Rowe Clack, MD  omeprazole (PRILOSEC) 20 MG capsule Take 20 mg by mouth daily.   Yes Historical Provider, MD    Physical Exam: Filed Vitals:   08/24/14 2101 08/24/14 2145 08/24/14 2200 08/24/14 2300  BP: 121/50 141/93 141/63 102/45  Pulse: 85 87 85 79  Temp:  98 F (36.7 C)    TempSrc:  Oral    Resp: 20 23 18 19   Height:  5\' 3"  (1.6 m)    Weight:  71.2 kg (156 lb 15.5 oz)    SpO2: 94% 94% 97% 96%    General: Alert, Awake and Oriented to Time, Place and Person. Appear in mild distress Eyes: PERRL ENT: Oral Mucosa clear moist. Neck: no JVD Cardiovascular: S1 and S2 Present, no Murmur, Peripheral Pulses Present Respiratory: Bilateral Air entry equal and Decreased,  Clear to Auscultation, no Crackles, no wheezes Abdomen: Bowel Sound present, Soft and non tender Skin: no Rash Extremities: no Pedal edema, no calf tenderness Neurologic: Grossly no focal neuro deficit.  Labs on Admission:  CBC:  Recent Labs Lab 08/24/14 1714  WBC 6.5  HGB 14.1  HCT 41.1  MCV 96.9  PLT 224    CMP     Component Value Date/Time   NA 142 08/24/2014 1714   NA 139 06/08/2013 1432     K 3.7 08/24/2014 1714   K 3.1* 06/08/2013 1432   CL 106 08/24/2014 1714   CL 105 06/08/2012 1403   CO2 27 08/24/2014 1714   CO2 26 06/08/2013 1432   GLUCOSE 95 08/24/2014 1714   GLUCOSE 146* 06/08/2013 1432   GLUCOSE 163* 06/08/2012 1403   BUN 11 08/24/2014 1714   BUN 13.8 06/08/2013 1432   CREATININE 0.79 08/24/2014 1714   CREATININE 1.0 06/08/2013 1432   CALCIUM 9.5  08/24/2014 1714   CALCIUM 9.5 06/08/2013 1432   PROT 7.0 08/24/2014 1714   PROT 6.7 06/08/2013 1432   ALBUMIN 4.0 08/24/2014 1714   ALBUMIN 3.9 06/08/2013 1432   AST 22 08/24/2014 1714   AST 12 06/08/2013 1432   ALT 28 08/24/2014 1714   ALT 15 06/08/2013 1432   ALKPHOS 87 08/24/2014 1714   ALKPHOS 84 06/08/2013 1432   BILITOT 0.3 08/24/2014 1714   BILITOT 0.40 06/08/2013 1432   GFRNONAA >60 08/24/2014 1714   GFRAA >60 08/24/2014 1714    No results for input(s): LIPASE, AMYLASE in the last 168 hours.   Recent Labs Lab 08/20/14 1459 08/24/14 2042  CKTOTAL 84  --   TROPONINI  --  <0.03   BNP (last 3 results) No results for input(s): BNP in the last 8760 hours.  ProBNP (last 3 results) No results for input(s): PROBNP in the last 8760 hours.   Radiological Exams on Admission: Ct Angio Chest Pe W/cm &/or Wo Cm  08/24/2014   CLINICAL DATA:  Chest pain with sudden onset, back pain  EXAM: CT ANGIOGRAPHY CHEST WITH CONTRAST  TECHNIQUE: Multidetector CT imaging of the chest was performed using the standard protocol during bolus administration of intravenous contrast. Multiplanar CT image reconstructions and MIPs were obtained to evaluate the vascular anatomy.  CONTRAST:  164mL OMNIPAQUE IOHEXOL 350 MG/ML SOLN  COMPARISON:  None.  FINDINGS: Atherosclerotic calcifications of thoracic aorta. Heart size within normal limits. Small hiatal hernia. Visualized upper abdomen shows no adrenal gland mass. Scattered hepatic cysts the largest in right hepatic lobe anteriorly measures 2 cm.  Sagittal images of the spine  shows mild degenerative changes thoracic spine.  There is no mediastinal hematoma or adenopathy. The study is of excellent technical quality. Mild perihilar peribronchial thickening. No pulmonary embolus is noted. Images of the lung parenchyma shows no acute infiltrate or pulmonary edema. There is no axillary adenopathy. No destructive bony lesions are noted.  Central airways are patent.  Review of the MIP images confirms the above findings.  IMPRESSION: 1. No pulmonary embolus is noted. 2. No acute infiltrate or pulmonary edema. 3. Small hiatal hernia. 4. Atherosclerotic calcifications of thoracic aorta. 5. Scattered hepatic cysts.   Electronically Signed   By: Lahoma Crocker M.D.   On: 08/24/2014 19:20   Dg Chest Portable 1 View  08/24/2014   CLINICAL DATA:  Substernal chest pain x1 day  EXAM: PORTABLE CHEST - 1 VIEW  COMPARISON:  05/28/2014  FINDINGS: Lungs are clear.  No pleural effusion or pneumothorax.  The heart is normal in size.  Visualized osseous structures are within normal limits.  IMPRESSION: No evidence of acute cardiopulmonary disease.   Electronically Signed   By: Julian Hy M.D.   On: 08/24/2014 16:54   EKG: Independently reviewed. normal sinus rhythm, nonspecific ST and T waves changes.  Assessment/Plan Principal Problem:   Chest pain Active Problems:   Essential hypertension   GERD   CAD (coronary artery disease)   Chronic diarrhea- suspect post-cholecystectomy   Myalgia   1. Chest pain The patient presents with chest pain located in the posterior subscapular area as well as central chest. The pain in the center of the chest is resolved with the posterior subscapular area still has some tenderness. Appears to be muscular spasm in nature. her initial EKG and troponin does not show any signs of acute ischemi. her chest x-ray is showing no acute abnormality. CT of the chest is negative for any pulmonary embolism  pneumothorax or any fracture. At present I will admit the  patient to the hospital for observation. I will obtain serial troponin every 6 hours, monitor on telemetry, get 2D echocardiogram in the morning to rule out ACS.  Continue with aspirin, ointment nitroglycerin.   2. History of hypertension. Blood pressure stable. Continue hydrochlorothiazide.  3. History of GERD as well as chronic diarrhea. The patient has taken colestipol which she is taking as needed. She mentions when she takes colestipol or diarrhea stays stable. Continue Prilosec and weakness potassium as needed.  4. Possible muscle spasm. Will use Valium. Patient is refusing to use any other pain medications.  5. History of COPD. When necessary nebulizers as needed.  Advance goals of care discussion: full code   DVT Prophylaxis: subcutaneous Heparin Nutrition: NPO after midnight  Family Communication: family was present at bedside, opportunity was given to ask question and all questions were answered satisfactorily at the time of interview. Disposition: Admitted as observation, step-down unit.  Author: Berle Mull, MD [facility] Pager: [phone] 08/24/2014  If 7PM-7AM, please contact night-coverage [email or web address] Password [email or web address]

## 2014-08-25 ENCOUNTER — Observation Stay (HOSPITAL_BASED_OUTPATIENT_CLINIC_OR_DEPARTMENT_OTHER): Payer: Medicare Other

## 2014-08-25 DIAGNOSIS — K219 Gastro-esophageal reflux disease without esophagitis: Secondary | ICD-10-CM

## 2014-08-25 DIAGNOSIS — M791 Myalgia: Secondary | ICD-10-CM

## 2014-08-25 DIAGNOSIS — I10 Essential (primary) hypertension: Secondary | ICD-10-CM | POA: Diagnosis not present

## 2014-08-25 DIAGNOSIS — R072 Precordial pain: Secondary | ICD-10-CM | POA: Diagnosis not present

## 2014-08-25 LAB — COMPREHENSIVE METABOLIC PANEL
ALT: 23 U/L (ref 14–54)
AST: 18 U/L (ref 15–41)
Albumin: 3.7 g/dL (ref 3.5–5.0)
Alkaline Phosphatase: 77 U/L (ref 38–126)
Anion gap: 8 (ref 5–15)
BUN: 12 mg/dL (ref 6–20)
CO2: 26 mmol/L (ref 22–32)
CREATININE: 0.75 mg/dL (ref 0.44–1.00)
Calcium: 9.1 mg/dL (ref 8.9–10.3)
Chloride: 104 mmol/L (ref 101–111)
GFR calc non Af Amer: >60 mL/min (ref >60)
Glucose, Bld: 110 mg/dL — ABNORMAL HIGH (ref 65–99)
POTASSIUM: 3.1 mmol/L — AB (ref 3.5–5.1)
SODIUM: 138 mmol/L (ref 135–145)
Total Bilirubin: 0.5 mg/dL (ref 0.3–1.2)
Total Protein: 6.2 g/dL — ABNORMAL LOW (ref 6.5–8.1)

## 2014-08-25 LAB — TROPONIN I
Troponin I: <0.03 ng/mL (ref <0.031)
Troponin I: <0.03 ng/mL (ref <0.031)

## 2014-08-25 LAB — PROTIME-INR
INR: 1.12 (ref 0.00–1.49)
Prothrombin Time: 14.6 seconds (ref 11.6–15.2)

## 2014-08-25 LAB — CBC WITH DIFFERENTIAL/PLATELET
Basophils Absolute: 0 10*3/uL (ref 0.0–0.1)
Basophils Relative: 0 % (ref 0–1)
EOS PCT: 1 % (ref 0–5)
Eosinophils Absolute: 0.1 10*3/uL (ref 0.0–0.7)
HEMATOCRIT: 39.1 % (ref 36.0–46.0)
Hemoglobin: 13.4 g/dL (ref 12.0–15.0)
Lymphocytes Relative: 24 % (ref 12–46)
Lymphs Abs: 1.7 10*3/uL (ref 0.7–4.0)
MCH: 33.3 pg (ref 26.0–34.0)
MCHC: 34.3 g/dL (ref 30.0–36.0)
MCV: 97.3 fL (ref 78.0–100.0)
Monocytes Absolute: 0.7 10*3/uL (ref 0.1–1.0)
Monocytes Relative: 10 % (ref 3–12)
NEUTROS PCT: 65 % (ref 43–77)
Neutro Abs: 4.6 10*3/uL (ref 1.7–7.7)
Platelets: 211 10*3/uL (ref 150–400)
RBC: 4.02 MIL/uL (ref 3.87–5.11)
RDW: 12.4 % (ref 11.5–15.5)
WBC: 7.1 10*3/uL (ref 4.0–10.5)

## 2014-08-25 LAB — MRSA PCR SCREENING: MRSA by PCR: NEGATIVE

## 2014-08-25 MED ORDER — POTASSIUM CHLORIDE CRYS ER 20 MEQ PO TBCR
40.0000 meq | EXTENDED_RELEASE_TABLET | Freq: Once | ORAL | Status: AC
  Administered 2014-08-25: 40 meq via ORAL
  Filled 2014-08-25: qty 2

## 2014-08-25 MED ORDER — POTASSIUM CHLORIDE 10 MEQ/100ML IV SOLN
10.0000 meq | INTRAVENOUS | Status: AC
  Administered 2014-08-25 (×2): 10 meq via INTRAVENOUS
  Filled 2014-08-25 (×2): qty 100

## 2014-08-25 MED ORDER — SODIUM CHLORIDE 0.9 % IV BOLUS (SEPSIS)
500.0000 mL | Freq: Once | INTRAVENOUS | Status: AC
  Administered 2014-08-25: 500 mL via INTRAVENOUS

## 2014-08-25 MED ORDER — CALCIUM CARBONATE ANTACID 500 MG PO CHEW
1.0000 | CHEWABLE_TABLET | Freq: Four times a day (QID) | ORAL | Status: DC | PRN
  Administered 2014-08-25: 200 mg via ORAL
  Filled 2014-08-25: qty 1

## 2014-08-25 NOTE — Progress Notes (Signed)
Discharge instructions reviewed with patient utilizing teach back method. No questions at this time. Patient discharged to home.

## 2014-08-25 NOTE — Progress Notes (Signed)
  Echocardiogram 2D Echocardiogram has been performed.  [name] [name] 08/25/2014, 10:53 AM

## 2014-08-25 NOTE — Discharge Summary (Signed)
Physician Discharge Summary  Imaya Duffy PHX:[id number] DOB: [birth year] DOA: 08/24/2014  PCP: [name], MD  Admit date: 08/24/2014 Discharge date: 08/25/2014  Time spent: 20 minutes  Recommendations for Outpatient Follow-up:  1. 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 Briefly, pt presented with chest pains. The patient was admitted for further work up.  Hospital Course:  The patient was admitted to the floor. Serial cardiac enzymes were found to be neg x 3. A 2d echo was obtained with no WMA and EF of 65-70%. Presenting symptoms fully resolved but were reproducible on palpation. The patient will follow up closely with PCP on discharge.  Procedures:  2d echo  Discharge Exam: Filed Vitals:   08/25/14 0900 08/25/14 1200 08/25/14 1400 08/25/14 1419  BP: 120/65  105/76 110/72  Pulse: 75  72   Temp:  98.7 F (37.1 C)  98.8 F (37.1 C)  TempSrc:  Oral  Oral  Resp: 21  24   Height:      Weight:      SpO2: 95%  98% 98%    General: Awake, in nad Cardiovascular: regular, s1, s2 Respiratory: normal resp effort, no wheezing  Discharge Instructions     Medication List    TAKE these medications        albuterol (2.5 MG/3ML) 0.083% nebulizer solution  Commonly known as:  PROVENTIL  Take 3 mLs (2.5 mg total) by nebulization every 6 (six) hours as needed for wheezing or shortness of breath.     aspirin EC 81 MG tablet  Take 1 tablet (81 mg total) by mouth daily.     colestipol 5 G granules  Commonly known as:  COLESTID  Take 5 g by mouth daily as needed  (diarrhea).     diazepam 5 MG tablet  Commonly known as:  VALIUM  TAKE ONE-HALF TO ONE TABLET BY MOUTH EVERY 12 HOURS AS NEEDED     hydrochlorothiazide 12.5 MG capsule  Commonly known as:  MICROZIDE  Take 1 capsule (12.5 mg total) by mouth every morning.     KLOR-CON M10 10 MEQ tablet  Generic drug:  potassium chloride  TAKE ONE TABLET BY MOUTH TWICE DAILY     omeprazole 20 MG capsule  Commonly known as:  PRILOSEC  Take 20 mg by mouth daily.       Allergies  Allergen Reactions  . Ciprofloxacin Hives    Blisters on legs  . Codeine Nausea And Vomiting    Sweating, "passes out"  . Macrobid [Nitrofurantoin Monohyd Macro]     Stool incontinence  . Morphine And Related     "hospital bed was shaking"  . Prevnar [Pneumococcal 13-Val Conj Vacc] Palpitations    Pt c/o redness, swelling on arm after shot.  Pt c/o having respiratory problems and coughing ever since she got it.    Follow-up Information    Follow up with [name], MD. Schedule an appointment as soon as possible for a visit in 1 week.   Specialty:  Internal Medicine   Why:  Hospital follow  up   Contact information:   [address] [place] [address] [phone]        The results of significant diagnostics from this hospitalization (including imaging, microbiology, ancillary and laboratory) are listed below for reference.    Significant Diagnostic Studies: Ct Angio Chest Pe W/cm &/or Wo Cm  08/24/2014   CLINICAL DATA:  Chest pain with sudden onset, back pain  EXAM: CT ANGIOGRAPHY CHEST WITH CONTRAST  TECHNIQUE: Multidetector CT imaging of the chest was performed using the standard protocol during bolus administration of intravenous contrast. Multiplanar CT image reconstructions and MIPs were obtained to evaluate the vascular anatomy.  CONTRAST:  175mL OMNIPAQUE IOHEXOL 350 MG/ML SOLN  COMPARISON:  None.  FINDINGS: Atherosclerotic calcifications of thoracic aorta. Heart size  within normal limits. Small hiatal hernia. Visualized upper abdomen shows no adrenal gland mass. Scattered hepatic cysts the largest in right hepatic lobe anteriorly measures 2 cm.  Sagittal images of the spine shows mild degenerative changes thoracic spine.  There is no mediastinal hematoma or adenopathy. The study is of excellent technical quality. Mild perihilar peribronchial thickening. No pulmonary embolus is noted. Images of the lung parenchyma shows no acute infiltrate or pulmonary edema. There is no axillary adenopathy. No destructive bony lesions are noted.  Central airways are patent.  Review of the MIP images confirms the above findings.  IMPRESSION: 1. No pulmonary embolus is noted. 2. No acute infiltrate or pulmonary edema. 3. Small hiatal hernia. 4. Atherosclerotic calcifications of thoracic aorta. 5. Scattered hepatic cysts.   Electronically Signed   By: Lahoma Crocker M.D.   On: 08/24/2014 19:20   Dg Chest Portable 1 View  08/24/2014   CLINICAL DATA:  Substernal chest pain x1 day  EXAM: PORTABLE CHEST - 1 VIEW  COMPARISON:  05/28/2014  FINDINGS: Lungs are clear.  No pleural effusion or pneumothorax.  The heart is normal in size.  Visualized osseous structures are within normal limits.  IMPRESSION: No evidence of acute cardiopulmonary disease.   Electronically Signed   By: Julian Hy M.D.   On: 08/24/2014 16:54    Microbiology: Recent Results (from the past 240 hour(s))  MRSA PCR Screening     Status: None   Collection Time: 08/24/14 10:14 PM  Result Value Ref Range Status   MRSA by PCR NEGATIVE NEGATIVE Final    Comment:        The GeneXpert MRSA Assay (FDA approved for NASAL specimens only), is one component of a comprehensive MRSA colonization surveillance program. It is not intended to diagnose MRSA infection nor to guide or monitor treatment for MRSA infections.      Labs: Basic Metabolic Panel:  Recent Labs Lab 08/20/14 1459 08/24/14 1714 08/25/14 0244  NA 138  142 138  K 3.2* 3.7 3.1*  CL 104 106 104  CO2 28 27 26   GLUCOSE 110* 95 110*  BUN 16 11 12   CREATININE 1.10 0.79 0.75  CALCIUM 9.7 9.5 9.1   Liver Function Tests:  Recent Labs Lab 08/20/14 1459 08/24/14 1714 08/25/14 0244  AST 15 22 18   ALT 22 28 23   ALKPHOS 85 87 77  BILITOT 0.3 0.3 0.5  PROT 6.4 7.0 6.2*  ALBUMIN 4.0 4.0 3.7   No results for input(s): LIPASE, AMYLASE in the last 168 hours. No results for input(s): AMMONIA in the last 168 hours. CBC:  Recent Labs Lab 08/24/14 1714 08/25/14 0244  WBC 6.5 7.1  NEUTROABS  --  4.6  HGB 14.1 13.4  HCT 41.1 39.1  MCV 96.9 97.3  PLT 224 211   Cardiac Enzymes:  Recent Labs Lab 08/20/14 1459 08/24/14 2042 08/25/14 0246 08/25/14 0836  CKTOTAL 84  --   --   --   TROPONINI  --  <0.03 <0.03 <0.03   BNP: BNP (last 3 results) No results for input(s): BNP in the last 8760 hours.  ProBNP (last 3 results) No results for input(s): PROBNP in the last 8760 hours.  CBG: No results for input(s): GLUCAP in the last 168 hours.  Signed:  Nicholous Girgenti, Orpah Melter  [facility] 08/25/2014, 3:44 PM

## 2014-08-27 ENCOUNTER — Telehealth: Payer: Self-pay | Admitting: *Deleted

## 2014-08-27 NOTE — Telephone Encounter (Signed)
Transition Care Management Follow-up Telephone Call D/C 08/25/14  How have you been since you were released from the hospital? Pt states she is doing ok   Do you understand why you were in the hospital? YES   Do you understand the discharge instrcutions? YES  Items Reviewed:  Medications reviewed: YES  Allergies reviewed: YES  Dietary changes reviewed: YES, heart healthy  Referrals reviewed: No referrals needed   Functional Questionnaire:   Activities of Daily Living (ADLs):   She states she are independent in the following: ambulation, bathing and hygiene, feeding, continence, grooming, toileting and dressing States she doesn't require assistance    Any transportation issues/concerns?: NO   Any patient concerns? NO   Confirmed importance and date/time of follow-up visits scheduled: YES, appt made 08/30/14 w/Dr. Asa Lente   Confirmed with patient if condition begins to worsen call PCP or go to the ER.

## 2014-08-30 ENCOUNTER — Ambulatory Visit (INDEPENDENT_AMBULATORY_CARE_PROVIDER_SITE_OTHER): Payer: Medicare Other | Admitting: Internal Medicine

## 2014-08-30 VITALS — BP 114/78 | HR 88 | Temp 97.8°F | Resp 16 | Ht 63.0 in | Wt 158.0 lb

## 2014-08-30 DIAGNOSIS — I10 Essential (primary) hypertension: Secondary | ICD-10-CM

## 2014-08-30 DIAGNOSIS — E876 Hypokalemia: Secondary | ICD-10-CM | POA: Diagnosis not present

## 2014-08-30 DIAGNOSIS — M5414 Radiculopathy, thoracic region: Secondary | ICD-10-CM | POA: Diagnosis not present

## 2014-08-30 MED ORDER — GABAPENTIN 100 MG PO CAPS: ORAL_CAPSULE | ORAL | Status: DC

## 2014-08-30 MED ORDER — SPIRONOLACTONE 25 MG PO TABS: 25.0000 mg | ORAL_TABLET | Freq: Every day | ORAL | Status: DC

## 2014-08-30 NOTE — Progress Notes (Signed)
Pre visit review using our clinic review tool, if applicable. No additional management support is needed unless otherwise documented below in the visit note. 

## 2014-08-30 NOTE — Progress Notes (Signed)
   Subjective:    Patient ID: [name] [name], female    DOB: [birth year], 69 y.o.   MRN: [id number]  HPI The hospital records 5/28-5/29/16 were reviewed. She was hospitalized with chest pain; cardiac evaluation was negative. CT angiogram showed atherosclerotic changes in the thoracic aorta. Since discharge she has continued to have a "baseball sized" area of dull discomfort in the posterior thorax which she describes as constant. It is better when supine.  She has been on HCTZ; her potassium was 3.1. She's been on oral potassium supplement. Her blood pressure is well controlled at home.Dr Carlean Purl is concerned about GI intolerance to the K+ supplement.  She has no cardiac or GI symptoms   Review of Systems  Chest pain, palpitations, tachycardia, exertional dyspnea, paroxysmal nocturnal dyspnea, claudication or edema are absent.  Unexplained weight loss, abdominal pain, significant dyspepsia, dysphagia, melena, rectal bleeding, or persistently small caliber stools are denied.    Objective:   Physical Exam  Pertinent or positive findings include: Bobbing tremor of the head.  Breath sounds are markedly decreased.  Heart sounds are distant.  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  General appearance :adequately nourished; in no distress. Eyes: No conjunctival inflammation or scleral icterus is present. Oral exam:  Lips and gums are healthy appearing.There is no oropharyngeal erythema or exudate noted. Complete dentures. Heart:  Normal rate and regular rhythm. S1 and S2 normal without gallop, murmur, click, rub or other extra sounds   Lungs:Chest clear to auscultation; no wheezes, rhonchi,rales ,or rubs present.No increased work of breathing.  Abdomen: bowel sounds normal, soft and non-tender without masses, organomegaly or hernias noted.  No guarding or rebound.  Vascular : all pulses equal ; no bruits present. Skin:Warm & dry.  Intact without suspicious lesions or rashes ; no tenting or  jaundice  Lymphatic: No lymphadenopathy is noted about the head, neck, axilla Neuro: Strength, tone & DTRs normal.        Assessment & Plan:  #1 thoracic radiculopathy  #2 hypokalemia in the context of HCTZ therapy  Plan: See orders/ recommendations

## 2014-08-30 NOTE — Patient Instructions (Signed)
Minimal Blood Pressure Goal= AVERAGE < 140/90;  Ideal is an AVERAGE < 135/85. This AVERAGE should be calculated from @ least 5-7 BP readings taken @ different times of day on different days of week. You should not respond to isolated BP readings , but rather the AVERAGE for that week .Please bring your  blood pressure cuff to office visits to verify that it is reliable.It  can also be checked against the blood pressure device at the pharmacy. Finger or wrist cuffs are not dependable; an arm cuff is.Fill the  prescription for the BP medication if BP NOT @ goal based on  7 to 14 day average. 

## 2014-08-31 ENCOUNTER — Encounter: Payer: Self-pay | Admitting: Internal Medicine

## 2014-09-23 ENCOUNTER — Other Ambulatory Visit: Payer: Self-pay

## 2014-09-24 ENCOUNTER — Encounter: Payer: Self-pay | Admitting: Internal Medicine

## 2014-09-24 ENCOUNTER — Ambulatory Visit (INDEPENDENT_AMBULATORY_CARE_PROVIDER_SITE_OTHER): Payer: Medicare Other | Admitting: Internal Medicine

## 2014-09-24 VITALS — BP 130/70 | HR 68 | Ht 61.75 in | Wt 158.2 lb

## 2014-09-24 DIAGNOSIS — K529 Noninfective gastroenteritis and colitis, unspecified: Secondary | ICD-10-CM | POA: Diagnosis not present

## 2014-09-24 DIAGNOSIS — I25119 Atherosclerotic heart disease of native coronary artery with unspecified angina pectoris: Secondary | ICD-10-CM

## 2014-09-24 DIAGNOSIS — K219 Gastro-esophageal reflux disease without esophagitis: Secondary | ICD-10-CM

## 2014-09-24 DIAGNOSIS — R1314 Dysphagia, pharyngoesophageal phase: Secondary | ICD-10-CM

## 2014-09-24 DIAGNOSIS — R1319 Other dysphagia: Secondary | ICD-10-CM | POA: Insufficient documentation

## 2014-09-24 DIAGNOSIS — R1310 Dysphagia, unspecified: Secondary | ICD-10-CM | POA: Insufficient documentation

## 2014-09-24 NOTE — Patient Instructions (Addendum)
You have been given a separate informational sheet regarding your tobacco use, the importance of quitting and local resources to help you quit.   Continue your current regimen per Dr. Carlean Purl.    I appreciate the opportunity to care for you.

## 2014-09-24 NOTE — Progress Notes (Signed)
   Subjective:    Patient ID: [name] [name], female    DOB: 01/27/1946, 69 y.o.   MRN: [id number] Chief complaint: Diarrhea HPI The patient is here for follow-up. 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 Suspect it's more of the latter. I tried her on colestipol and she developed muscle aches she reduce the dose and still had them. However since then she has decided to use colestipol at a lower dose probably a half a scoop as needed at night if she needs to leave the house in the morning. Otherwise she will just have 2 or 3 loose perhaps urgent bowel movements in the morning and then be okay. She feels this is manageable and she is not having muscle aches. She was admitted to the hospital with chest pain problems, thought to be perhaps rib pain, her potassium was low as well and her blood pressure medicines have been changed her stop by Dr. Linna Darner in the interim. She is due for follow-up with Dr. Asa Lente in the coming weeks and plans to have her labs rechecked.  In addition to a recent colonoscopy she had an endoscopy which showed a hiatal hernia but no stricture and no esophageal dilation was performed as dysphagia she was having improved at that time. Since then she was eating onion and had some dysphagia but otherwise has been well. Medications, allergies, past medical history, past surgical history, family history and social history are reviewed and updated in the EMR.   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

## 2014-09-24 NOTE — Assessment & Plan Note (Signed)
Continue ppi Modify diet prn

## 2014-09-24 NOTE — Assessment & Plan Note (Signed)
Happy with prn colestipol Will continue

## 2014-09-24 NOTE — Assessment & Plan Note (Signed)
Continue ppi

## 2014-09-26 ENCOUNTER — Inpatient Hospital Stay: Admission: RE | Admit: 2014-09-26 | Payer: Medicare Other | Source: Ambulatory Visit

## 2014-09-26 ENCOUNTER — Encounter: Payer: Self-pay | Admitting: Internal Medicine

## 2014-09-26 ENCOUNTER — Ambulatory Visit (INDEPENDENT_AMBULATORY_CARE_PROVIDER_SITE_OTHER): Payer: Medicare Other | Admitting: Internal Medicine

## 2014-09-26 ENCOUNTER — Other Ambulatory Visit (INDEPENDENT_AMBULATORY_CARE_PROVIDER_SITE_OTHER): Payer: Medicare Other

## 2014-09-26 VITALS — BP 130/84 | HR 73 | Temp 97.6°F | Ht 61.75 in | Wt 158.5 lb

## 2014-09-26 DIAGNOSIS — R739 Hyperglycemia, unspecified: Secondary | ICD-10-CM

## 2014-09-26 DIAGNOSIS — I25119 Atherosclerotic heart disease of native coronary artery with unspecified angina pectoris: Secondary | ICD-10-CM | POA: Diagnosis not present

## 2014-09-26 DIAGNOSIS — E785 Hyperlipidemia, unspecified: Secondary | ICD-10-CM

## 2014-09-26 DIAGNOSIS — M8580 Other specified disorders of bone density and structure, unspecified site: Secondary | ICD-10-CM

## 2014-09-26 DIAGNOSIS — I2510 Atherosclerotic heart disease of native coronary artery without angina pectoris: Secondary | ICD-10-CM

## 2014-09-26 DIAGNOSIS — Z0000 Encounter for general adult medical examination without abnormal findings: Secondary | ICD-10-CM

## 2014-09-26 DIAGNOSIS — J432 Centrilobular emphysema: Secondary | ICD-10-CM

## 2014-09-26 DIAGNOSIS — I10 Essential (primary) hypertension: Secondary | ICD-10-CM | POA: Diagnosis not present

## 2014-09-26 DIAGNOSIS — M810 Age-related osteoporosis without current pathological fracture: Secondary | ICD-10-CM

## 2014-09-26 LAB — BASIC METABOLIC PANEL
BUN: 15 mg/dL (ref 6–23)
CO2: 28 mEq/L (ref 19–32)
CREATININE: 0.88 mg/dL (ref 0.40–1.20)
Calcium: 9.5 mg/dL (ref 8.4–10.5)
Chloride: 106 mEq/L (ref 96–112)
GFR: 67.75 mL/min (ref >60.00)
GLUCOSE: 100 mg/dL — AB (ref 70–99)
Potassium: 4.3 mEq/L (ref 3.5–5.1)
Sodium: 141 mEq/L (ref 135–145)

## 2014-09-26 LAB — HEMOGLOBIN A1C: Hgb A1c MFr Bld: 5.3 % (ref 4.6–6.5)

## 2014-09-26 LAB — LIPID PANEL
CHOL/HDL RATIO: 5
Cholesterol: 195 mg/dL (ref 0–200)
HDL: 35.6 mg/dL — AB (ref >39.00)
LDL Cholesterol: 120 mg/dL — ABNORMAL HIGH (ref 0–99)
NonHDL: 159.4
Triglycerides: 198 mg/dL — ABNORMAL HIGH (ref 0.0–149.0)
VLDL: 39.6 mg/dL (ref 0.0–40.0)

## 2014-09-26 MED ORDER — DIAZEPAM 5 MG PO TABS: 2.5000 mg | ORAL_TABLET | Freq: Two times a day (BID) | ORAL | Status: DC | PRN

## 2014-09-26 NOTE — Assessment & Plan Note (Signed)
Random glucose >160 on 05/29/2012 reviewed No history of diabetes, check A1c now and minimize steroids as able Lab Results  Component Value Date   HGBA1C 5.5 04/10/2013

## 2014-09-26 NOTE — Assessment & Plan Note (Signed)
Previously on hctz -stopped 08/2014 due to low K Previously rx'd daily low dose inderal to help palpitations and tremor symptoms, but beta-blocker exacerbated fatigue and depression To initiate aldactone if SBP>130, but has not needed same per BP log review Continue to follow up with cards prn  BP Readings from Last 3 Encounters:  09/26/14 130/84  09/24/14 130/70  08/30/14 114/78

## 2014-09-26 NOTE — Assessment & Plan Note (Signed)
Patient has a 25-50% LAD lesion noted on cardiac CT in 2013 myoview 11/2-14 negative  continue medical therapy with enteric-coated aspirin 81 mg daily Intol of statins Advised tobacco cessation follow up with cards

## 2014-09-26 NOTE — Addendum Note (Signed)
Addended by: Lowella Dandy on: 09/26/2014 09:24 AM   Modules accepted: Orders, SmartSet

## 2014-09-26 NOTE — Assessment & Plan Note (Signed)
Pt had declined statin therapy in past, but started prava 06/2011 after chest pain incident Intol of prava due to itch, then crestor since 08/2011 per cards Then changed to atorva 01/2012 -  Patient stopped taking same early 2015 because of myalgias and cardiology has deemed pt statin intol Reviewed importance of lipid control to minimize CAD risk - as well as BP control and smoking cessation Check lipids annually and adjust as needed

## 2014-09-26 NOTE — Progress Notes (Signed)
Subjective:    Patient ID: [name] [name], female    DOB: Feb 21, 1946, 69 y.o.   MRN: [id number]  HPI   Here for medicare wellness/annual exam  Diet: heart healthy, carb modified Physical activity: sedentary Depression/mood screen: negative Hearing: intact to whispered voice Visual acuity: grossly normal, performs annual eye exam  ADLs: capable Fall risk: none Home safety: good Cognitive evaluation: intact to orientation, naming, recall and repetition EOL planning: adv directives, full code/ I agree  I have personally reviewed and have noted 1. The patient's medical and social history 2. Their use of alcohol, tobacco or illicit drugs 3. Their current medications and supplements 4. The patient's functional ability including ADL's, fall risks, home safety risks and hearing or visual impairment. 5. Diet and physical activities 6. Evidence for depression or mood disorders  Also reviewed chronic conditions, interval events and current concerns  Past Medical History  Diagnosis Date  . Hereditary hemochromatosis 2004  . ASTHMA   . HYPERTENSION   . GERD   . Chronic kidney disease   . Arthritis     left shoulder   . Headache(784.0)     sinus headaches   . H/O hiatal hernia   . Anxiety   . Hyperlipidemia   . CAD (coronary artery disease)   . Depression   . Hypertension   . Urinary tract infection   . COPD (chronic obstructive pulmonary disease) with emphysema     mild dz on PFTs 11/2013  . 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 Hx of adenomatous polyp of colon 07/25/2014   Family History  Problem Relation Age of Onset  . Hypertension Mother   . Heart disease Mother   . Breast cancer Mother 26  . Diabetes Father   . Heart disease Father   . Alcohol abuse Other   . Arthritis Other   . Cirrhosis Brother   . Cirrhosis Brother   . Cirrhosis Brother   . Lung cancer Brother   .  Colon cancer Neg Hx   . Colon polyps Neg Hx   . Esophageal cancer Neg Hx   . Rectal cancer Neg Hx   . Stomach cancer Neg Hx    History  Substance Use Topics  . Smoking status: Current Every Day Smoker -- 0.33 packs/day for 50 years    Types: Cigarettes    Last Attempt to Quit: 04/30/2014  . Smokeless tobacco: Never Used  . Alcohol Use: No    Review of Systems  Constitutional: Negative for fatigue and unexpected weight change.  Respiratory: Negative for cough, shortness of breath and wheezing.   Cardiovascular: Negative for chest pain, palpitations and leg swelling.  Gastrointestinal: Negative for nausea, abdominal pain and diarrhea.  Neurological: Negative for dizziness, weakness, light-headedness and headaches.  Psychiatric/Behavioral: Negative for dysphoric mood. The patient is not nervous/anxious.   All other systems reviewed and are negative.  Patient Care Team: Rowe Clack, MD as PCP - General (Internal Medicine) Harle Battiest, MD as Consulting Physician (Obstetrics and Gynecology) Nancy Marus, MD (Gynecologic Oncology) Lelon Perla, MD (Cardiology) Heath Lark, MD as Consulting Physician (Hematology and Oncology) Gatha Mayer, MD (Gastroenterology)     Objective:    Physical Exam  Constitutional: She is oriented to person, place, and time. She appears well-developed and well-nourished. No distress.  Neck:  No carotid bruits B  Cardiovascular: Normal rate, regular rhythm and normal heart sounds.   No murmur  heard. Pulmonary/Chest: Effort normal and breath sounds normal. No respiratory distress.  Musculoskeletal: She exhibits no edema.  Neurological: She is alert and oriented to person, place, and time. She has normal reflexes. No cranial nerve deficit.  Chronic head and neck tremor  Psychiatric: She has a normal mood and affect. Her behavior is normal. Judgment and thought content normal.    BP 130/84 mmHg  Pulse 73  Temp(Src) 97.6 F (36.4 C) (Oral)   Ht 5' 1.75" (1.568 m)  Wt 158 lb 8 oz (71.895 kg)  BMI 29.24 kg/m2  SpO2 97% Wt Readings from Last 3 Encounters:  09/26/14 158 lb 8 oz (71.895 kg)  09/24/14 158 lb 4 oz (71.782 kg)  08/30/14 158 lb (71.668 kg)    Lab Results  Component Value Date   WBC 7.1 08/25/2014   HGB 13.4 08/25/2014   HCT 39.1 08/25/2014   PLT 211 08/25/2014   GLUCOSE 110* 08/25/2014   CHOL 130 04/10/2013   TRIG 130.0 04/10/2013   HDL 43.00 04/10/2013   LDLCALC 61 04/10/2013   ALT 23 08/25/2014   AST 18 08/25/2014   NA 138 08/25/2014   K 3.1* 08/25/2014   CL 104 08/25/2014   CREATININE 0.75 08/25/2014   BUN 12 08/25/2014   CO2 26 08/25/2014   TSH 1.64 02/18/2014   INR 1.12 08/25/2014   HGBA1C 5.5 04/10/2013    Ct Angio Chest Pe W/cm &/or Wo Cm  08/24/2014   CLINICAL DATA:  Chest pain with sudden onset, back pain  EXAM: CT ANGIOGRAPHY CHEST WITH CONTRAST  TECHNIQUE: Multidetector CT imaging of the chest was performed using the standard protocol during bolus administration of intravenous contrast. Multiplanar CT image reconstructions and MIPs were obtained to evaluate the vascular anatomy.  CONTRAST:  122mL OMNIPAQUE IOHEXOL 350 MG/ML SOLN  COMPARISON:  None.  FINDINGS: Atherosclerotic calcifications of thoracic aorta. Heart size within normal limits. Small hiatal hernia. Visualized upper abdomen shows no adrenal gland mass. Scattered hepatic cysts the largest in right hepatic lobe anteriorly measures 2 cm.  Sagittal images of the spine shows mild degenerative changes thoracic spine.  There is no mediastinal hematoma or adenopathy. The study is of excellent technical quality. Mild perihilar peribronchial thickening. No pulmonary embolus is noted. Images of the lung parenchyma shows no acute infiltrate or pulmonary edema. There is no axillary adenopathy. No destructive bony lesions are noted.  Central airways are patent.  Review of the MIP images confirms the above findings.  IMPRESSION: 1. No pulmonary  embolus is noted. 2. No acute infiltrate or pulmonary edema. 3. Small hiatal hernia. 4. Atherosclerotic calcifications of thoracic aorta. 5. Scattered hepatic cysts.   Electronically Signed   By: Lahoma Crocker M.D.   On: 08/24/2014 19:20   Dg Chest Portable 1 View  08/24/2014   CLINICAL DATA:  Substernal chest pain x1 day  EXAM: PORTABLE CHEST - 1 VIEW  COMPARISON:  05/28/2014  FINDINGS: Lungs are clear.  No pleural effusion or pneumothorax.  The heart is normal in size.  Visualized osseous structures are within normal limits.  IMPRESSION: No evidence of acute cardiopulmonary disease.   Electronically Signed   By: Julian Hy M.D.   On: 08/24/2014 16:54       Assessment & Plan:   AWV/z00.00 - Today patient counseled on age appropriate routine health concerns for screening and prevention, each reviewed and up to date or declined. Immunizations reviewed and up to date or declined. Labs ordered and reviewed. Risk factors for depression  reviewed and negative. Hearing function and visual acuity are intact. ADLs screened and addressed as needed. Functional ability and level of safety reviewed and appropriate. Education, counseling and referrals performed based on assessed risks today. Patient provided with a copy of personalized plan for preventive services.  Problem List Items Addressed This Visit    CAD (coronary artery disease)    Patient has a 25-50% LAD lesion noted on cardiac CT in 2013 myoview 11/2-14 negative  continue medical therapy with enteric-coated aspirin 81 mg daily Intol of statins Advised tobacco cessation follow up with cards      Relevant Orders   Lipid panel   COPD (chronic obstructive pulmonary disease) with emphysema    Mild obst dz on PFTs 12/07/13 - reviewed with pt today Using Alb MDI prn - but concerned about cost with 2016 formulary changes 01/2014 rx'd Spiriva but cost prohibitive Counseled on importance smoking cessation      Dyslipidemia    Pt had declined  statin therapy in past, but started prava 06/2011 after chest pain incident Intol of prava due to itch, then crestor since 08/2011 per cards Then changed to atorva 01/2012 -  Patient stopped taking same early 2015 because of myalgias and cardiology has deemed pt statin intol Reviewed importance of lipid control to minimize CAD risk - as well as BP control and smoking cessation Check lipids annually and adjust as needed      Relevant Orders   Lipid panel   Essential hypertension    Previously on hctz -stopped 08/2014 due to low K Previously rx'd daily low dose inderal to help palpitations and tremor symptoms, but beta-blocker exacerbated fatigue and depression To initiate aldactone if SBP>130, but has not needed same per BP log review Continue to follow up with cards prn  BP Readings from Last 3 Encounters:  09/26/14 130/84  09/24/14 130/70  08/30/14 114/78        Relevant Orders   Basic metabolic panel   Hyperglycemia    Random glucose >160 on 05/29/2012 reviewed No history of diabetes, check A1c now and minimize steroids as able Lab Results  Component Value Date   HGBA1C 5.5 04/10/2013        Relevant Orders   Basic metabolic panel   Hemoglobin A1c    Other Visit Diagnoses    Routine general medical examination at a health care facility    -  Primary    Osteopenia        Relevant Orders    DG Bone Density        [name], MD

## 2014-09-26 NOTE — Patient Instructions (Addendum)
It was good to see you today.  We have reviewed your prior records including labs and tests today  Health Maintenance reviewed - schedule DEXA bone density to evaluate for potential bone loss. All other recommended immunizations and age-appropriate screenings are up-to-date.  Test(s) ordered today. Your results will be released to [place] (or called to you) after review, usually within 72hours after test completion. If any changes need to be made, you will be notified at that same time.  Medications reviewed and updated, no changes recommended at this time. Refills provided as discussed  Please consider cessation of smoking. Continuing to smoke will increase her risk of heart attack and stroke  Please schedule followup in 12 months for annual exam and labs, call sooner if problems.  Health Maintenance Adopting a healthy lifestyle and getting preventive care can go a long way to promote health and wellness. Talk with your health care provider about what schedule of regular examinations is right for you. This is a good chance for you to check in with your provider about disease prevention and staying healthy. In between checkups, there are plenty of things you can do on your own. Experts have done a lot of research about which lifestyle changes and preventive measures are most likely to keep you healthy. Ask your health care provider for more information. WEIGHT AND DIET  Eat a healthy diet  Be sure to include plenty of vegetables, fruits, low-fat dairy products, and lean protein.  Do not eat a lot of foods high in solid fats, added sugars, or salt.  Get regular exercise. This is one of the most important things you can do for your health.  Most adults should exercise for at least 150 minutes each week. The exercise should increase your heart rate and make you sweat (moderate-intensity exercise).  Most adults should also do strengthening exercises at least twice a week. This is in addition  to the moderate-intensity exercise.  Maintain a healthy weight  Body mass index (BMI) is a measurement that can be used to identify possible weight problems. It estimates body fat based on height and weight. Your health care provider can help determine your BMI and help you achieve or maintain a healthy weight.  For females 63 years of age and older:   A BMI below 18.5 is considered underweight.  A BMI of 18.5 to 24.9 is normal.  A BMI of 25 to 29.9 is considered overweight.  A BMI of 30 and above is considered obese.  Watch levels of cholesterol and blood lipids  You should start having your blood tested for lipids and cholesterol at 69 years of age, then have this test every 5 years.  You may need to have your cholesterol levels checked more often if:  Your lipid or cholesterol levels are high.  You are older than 69 years of age.  You are at high risk for heart disease.  CANCER SCREENING   Lung Cancer  Lung cancer screening is recommended for adults 56-74 years old who are at high risk for lung cancer because of a history of smoking.  A yearly low-dose CT scan of the lungs is recommended for people who:  Currently smoke.  Have quit within the past 15 years.  Have at least a 30-pack-year history of smoking. A pack year is smoking an average of one pack of cigarettes a day for 1 year.  Yearly screening should continue until it has been 15 years since you quit.  Yearly screening  should stop if you develop a health problem that would prevent you from having lung cancer treatment.  Breast Cancer  Practice breast self-awareness. This means understanding how your breasts normally appear and feel.  It also means doing regular breast self-exams. Let your health care provider know about any changes, no matter how small.  If you are in your 20s or 30s, you should have a clinical breast exam (CBE) by a health care provider every 1-3 years as part of a regular health  exam.  If you are 8 or older, have a CBE every year. Also consider having a breast X-ray (mammogram) every year.  If you have a family history of breast cancer, talk to your health care provider about genetic screening.  If you are at high risk for breast cancer, talk to your health care provider about having an MRI and a mammogram every year.  Breast cancer gene (BRCA) assessment is recommended for women who have family members with BRCA-related cancers. BRCA-related cancers include:  Breast.  Ovarian.  Tubal.  Peritoneal cancers.  Results of the assessment will determine the need for genetic counseling and BRCA1 and BRCA2 testing. Cervical Cancer Routine pelvic examinations to screen for cervical cancer are no longer recommended for nonpregnant women who are considered low risk for cancer of the pelvic organs (ovaries, uterus, and vagina) and who do not have symptoms. A pelvic examination may be necessary if you have symptoms including those associated with pelvic infections. Ask your health care provider if a screening pelvic exam is right for you.   The Pap test is the screening test for cervical cancer for women who are considered at risk.  If you had a hysterectomy for a problem that was not cancer or a condition that could lead to cancer, then you no longer need Pap tests.  If you are older than 65 years, and you have had normal Pap tests for the past 10 years, you no longer need to have Pap tests.  If you have had past treatment for cervical cancer or a condition that could lead to cancer, you need Pap tests and screening for cancer for at least 20 years after your treatment.  If you no longer get a Pap test, assess your risk factors if they change (such as having a new sexual partner). This can affect whether you should start being screened again.  Some women have medical problems that increase their chance of getting cervical cancer. If this is the case for you, your health  care provider may recommend more frequent screening and Pap tests.  The human papillomavirus (HPV) test is another test that may be used for cervical cancer screening. The HPV test looks for the virus that can cause cell changes in the cervix. The cells collected during the Pap test can be tested for HPV.  The HPV test can be used to screen women 6 years of age and older. Getting tested for HPV can extend the interval between normal Pap tests from three to five years.  An HPV test also should be used to screen women of any age who have unclear Pap test results.  After 69 years of age, women should have HPV testing as often as Pap tests.  Colorectal Cancer  This type of cancer can be detected and often prevented.  Routine colorectal cancer screening usually begins at 69 years of age and continues through 69 years of age.  Your health care provider may recommend screening at an earlier  age if you have risk factors for colon cancer.  Your health care provider may also recommend using home test kits to check for hidden blood in the stool.  A small camera at the end of a tube can be used to examine your colon directly (sigmoidoscopy or colonoscopy). This is done to check for the earliest forms of colorectal cancer.  Routine screening usually begins at age 81.  Direct examination of the colon should be repeated every 5-10 years through 69 years of age. However, you may need to be screened more often if early forms of precancerous polyps or small growths are found. Skin Cancer  Check your skin from head to toe regularly.  Tell your health care provider about any new moles or changes in moles, especially if there is a change in a mole's shape or color.  Also tell your health care provider if you have a mole that is larger than the size of a pencil eraser.  Always use sunscreen. Apply sunscreen liberally and repeatedly throughout the day.  Protect yourself by wearing long sleeves, pants, a  wide-brimmed hat, and sunglasses whenever you are outside. HEART DISEASE, DIABETES, AND HIGH BLOOD PRESSURE   Have your blood pressure checked at least every 1-2 years. High blood pressure causes heart disease and increases the risk of stroke.  If you are between 15 years and 36 years old, ask your health care provider if you should take aspirin to prevent strokes.  Have regular diabetes screenings. This involves taking a blood sample to check your fasting blood sugar level.  If you are at a normal weight and have a low risk for diabetes, have this test once every three years after 69 years of age.  If you are overweight and have a high risk for diabetes, consider being tested at a younger age or more often. PREVENTING INFECTION  Hepatitis B  If you have a higher risk for hepatitis B, you should be screened for this virus. You are considered at high risk for hepatitis B if:  You were born in a country where hepatitis B is common. Ask your health care provider which countries are considered high risk.  Your parents were born in a high-risk country, and you have not been immunized against hepatitis B (hepatitis B vaccine).  You have HIV or AIDS.  You use needles to inject street drugs.  You live with someone who has hepatitis B.  You have had sex with someone who has hepatitis B.  You get hemodialysis treatment.  You take certain medicines for conditions, including cancer, organ transplantation, and autoimmune conditions. Hepatitis C  Blood testing is recommended for:  Everyone born from [birth year] through 1965.  Anyone with known risk factors for hepatitis C. Sexually transmitted infections (STIs)  You should be screened for sexually transmitted infections (STIs) including gonorrhea and chlamydia if:  You are sexually active and are younger than 69 years of age.  You are older than 69 years of age and your health care provider tells you that you are at risk for this type of  infection.  Your sexual activity has changed since you were last screened and you are at an increased risk for chlamydia or gonorrhea. Ask your health care provider if you are at risk.  If you do not have HIV, but are at risk, it may be recommended that you take a prescription medicine daily to prevent HIV infection. This is called pre-exposure prophylaxis (PrEP). You are considered at risk if:  You are sexually active and do not regularly use condoms or know the HIV status of your partner(s).  You take drugs by injection.  You are sexually active with a partner who has HIV. Talk with your health care provider about whether you are at high risk of being infected with HIV. If you choose to begin PrEP, you should first be tested for HIV. You should then be tested every 3 months for as long as you are taking PrEP.  PREGNANCY   If you are premenopausal and you may become pregnant, ask your health care provider about preconception counseling.  If you may become pregnant, take 400 to 800 micrograms (mcg) of folic acid every day.  If you want to prevent pregnancy, talk to your health care provider about birth control (contraception). OSTEOPOROSIS AND MENOPAUSE   Osteoporosis is a disease in which the bones lose minerals and strength with aging. This can result in serious bone fractures. Your risk for osteoporosis can be identified using a bone density scan.  If you are 65 years of age or older, or if you are at risk for osteoporosis and fractures, ask your health care provider if you should be screened.  Ask your health care provider whether you should take a calcium or vitamin D supplement to lower your risk for osteoporosis.  Menopause may have certain physical symptoms and risks.  Hormone replacement therapy may reduce some of these symptoms and risks. Talk to your health care provider about whether hormone replacement therapy is right for you.  HOME CARE INSTRUCTIONS   Schedule regular  health, dental, and eye exams.  Stay current with your immunizations.   Do not use any tobacco products including cigarettes, chewing tobacco, or electronic cigarettes.  If you are pregnant, do not drink alcohol.  If you are breastfeeding, limit how much and how often you drink alcohol.  Limit alcohol intake to no more than 1 drink per day for nonpregnant women. One drink equals 12 ounces of beer, 5 ounces of wine, or 1 ounces of hard liquor.  Do not use street drugs.  Do not share needles.  Ask your health care provider for help if you need support or information about quitting drugs.  Tell your health care provider if you often feel depressed.  Tell your health care provider if you have ever been abused or do not feel safe at home. Document Released: 09/28/2010 Document Revised: 07/30/2013 Document Reviewed: 02/14/2013 Exit[employer] Patient Information 2015 [employer]. This information is not intended to replace advice given to you by your health care provider. Make sure you discuss any questions you have with your health care provider.  

## 2014-09-26 NOTE — Progress Notes (Signed)
Pre visit review using our clinic review tool, if applicable. No additional management support is needed unless otherwise documented below in the visit note. 

## 2014-09-26 NOTE — Assessment & Plan Note (Signed)
Mild obst dz on PFTs 12/07/13 - reviewed with pt today Using Alb MDI prn - but concerned about cost with 2016 formulary changes 01/2014 rx'd Spiriva but cost prohibitive Counseled on importance smoking cessation

## 2014-10-02 ENCOUNTER — Other Ambulatory Visit: Payer: Medicare Other

## 2014-10-03 ENCOUNTER — Ambulatory Visit (INDEPENDENT_AMBULATORY_CARE_PROVIDER_SITE_OTHER)
Admission: RE | Admit: 2014-10-03 | Discharge: 2014-10-03 | Disposition: A | Discharge status: Home / Self Care | Payer: Medicare Other | Source: Ambulatory Visit | Attending: Internal Medicine | Admitting: Internal Medicine

## 2014-10-03 ENCOUNTER — Other Ambulatory Visit: Payer: Medicare Other

## 2014-10-03 ENCOUNTER — Telehealth: Payer: Self-pay

## 2014-10-03 ENCOUNTER — Inpatient Hospital Stay: Admission: RE | Admit: 2014-10-03 | Payer: Medicare Other | Source: Ambulatory Visit

## 2014-10-03 DIAGNOSIS — M810 Age-related osteoporosis without current pathological fracture: Secondary | ICD-10-CM | POA: Diagnosis not present

## 2014-10-03 DIAGNOSIS — M8580 Other specified disorders of bone density and structure, unspecified site: Secondary | ICD-10-CM

## 2014-10-03 HISTORY — DX: Other specified disorders of bone density and structure, unspecified site: M85.80

## 2014-10-03 NOTE — Telephone Encounter (Signed)
Xray called and was not able to pull the order for dg dexa due to the original appt being rescheduled. I added a new order for xray so that pt could have scan completed today.

## 2014-10-03 NOTE — Addendum Note (Signed)
Addended by: Lowella Dandy on: 10/03/2014 09:24 AM   Modules accepted: Orders, SmartSet

## 2014-10-07 DIAGNOSIS — M8580 Other specified disorders of bone density and structure, unspecified site: Secondary | ICD-10-CM

## 2014-10-07 HISTORY — DX: Other specified disorders of bone density and structure, unspecified site: M85.80

## 2014-11-18 ENCOUNTER — Other Ambulatory Visit: Payer: Medicare Other

## 2014-11-18 ENCOUNTER — Encounter: Payer: Self-pay | Admitting: Internal Medicine

## 2014-11-18 ENCOUNTER — Ambulatory Visit (INDEPENDENT_AMBULATORY_CARE_PROVIDER_SITE_OTHER): Payer: Medicare Other | Admitting: Internal Medicine

## 2014-11-18 VITALS — BP 122/70 | HR 91 | Temp 98.3°F | Wt 158.0 lb

## 2014-11-18 DIAGNOSIS — Z720 Tobacco use: Secondary | ICD-10-CM

## 2014-11-18 DIAGNOSIS — J321 Chronic frontal sinusitis: Secondary | ICD-10-CM | POA: Diagnosis not present

## 2014-11-18 DIAGNOSIS — I25119 Atherosclerotic heart disease of native coronary artery with unspecified angina pectoris: Secondary | ICD-10-CM

## 2014-11-18 DIAGNOSIS — R350 Frequency of micturition: Secondary | ICD-10-CM

## 2014-11-18 DIAGNOSIS — R312 Other microscopic hematuria: Secondary | ICD-10-CM

## 2014-11-18 DIAGNOSIS — F17200 Nicotine dependence, unspecified, uncomplicated: Secondary | ICD-10-CM

## 2014-11-18 DIAGNOSIS — M545 Low back pain: Secondary | ICD-10-CM | POA: Diagnosis not present

## 2014-11-18 DIAGNOSIS — R3129 Other microscopic hematuria: Secondary | ICD-10-CM

## 2014-11-18 LAB — POCT URINALYSIS DIPSTICK
BILIRUBIN UA: NEGATIVE
Glucose, UA: NEGATIVE
Ketones, UA: NEGATIVE
Leukocytes, UA: NEGATIVE
Nitrite, UA: NEGATIVE
PH UA: 6
Protein, UA: NEGATIVE
Spec Grav, UA: 1.02
Urobilinogen, UA: 0.2

## 2014-11-18 MED ORDER — SULFAMETHOXAZOLE-TRIMETHOPRIM 800-160 MG PO TABS: 1.0000 | ORAL_TABLET | Freq: Two times a day (BID) | ORAL | Status: AC

## 2014-11-18 MED ORDER — FLUTICASONE PROPIONATE 50 MCG/ACT NA SUSP: 1.0000 | Freq: Every day | NASAL | Status: DC

## 2014-11-18 NOTE — Patient Instructions (Signed)
It was good to see you today.  We have reviewed your prior records including labs and tests today  Test(s) ordered today. Your results will be released to [place] (or called to you) after review, usually within 72hours after test completion. If any changes need to be made, you will be notified at that same time.  Medications reviewed and updated Septra antibiotics 2x/day x 1 week - no other changes recommended at this time. Your prescription(s) have been submitted to your pharmacy. Please take as directed and contact our office if you believe you are having problem(s) with the medication(s).  we'll make referral to urology if no growth on urine culture to evaluate the blood trace in your urine sample . Our office will contact you regarding appointment(s) once made.  Continue to think about giving up cigarettes! Use nicotine gum, nicotine patches or electronic cigarettes. If you're interested in medication to help you quit, please call  - let me know how I can help!  Please schedule followup in 3-4 months, call sooner if problems.

## 2014-11-18 NOTE — Progress Notes (Signed)
Subjective:    Patient ID: [name] [name], female    DOB: 1945/10/31, 69 y.o.   MRN: [id number]  Sinusitis This is a chronic problem. The current episode started in the past 7 days. The problem has been gradually worsening since onset. The maximum temperature recorded prior to her arrival was 100.4 - 100.9 F. The fever has been present for 1 to 2 days. The pain is moderate. Associated symptoms include congestion, coughing, a hoarse voice and sinus pressure. Pertinent negatives include no chills, sneezing or sore throat. Past treatments include saline sprays. The treatment provided no relief.    Past Medical History  Diagnosis Date  . Hereditary hemochromatosis 2004  . ASTHMA   . HYPERTENSION   . GERD   . Chronic kidney disease   . Arthritis     left shoulder   . Headache(784.0)     sinus headaches   . H/O hiatal hernia   . Anxiety   . Hyperlipidemia   . CAD (coronary artery disease)   . Depression   . Hypertension   . Urinary tract infection   . COPD (chronic obstructive pulmonary disease) with emphysema     mild dz on PFTs 11/2013  . 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 Hx of adenomatous polyp of colon 07/25/2014  . Osteopenia 10/07/2014    DEXA @ [facility] 09/2014: -1.9 L fem    Review of Systems  Constitutional: Negative for chills.  HENT: Positive for congestion, hoarse voice and sinus pressure. Negative for sneezing and sore throat.   Respiratory: Positive for cough.   Genitourinary: Positive for dysuria and flank pain (left).       Objective:    Physical Exam  Constitutional: She appears well-developed and well-nourished. No distress.  HENT:  Head: Normocephalic.  Right Ear: External ear normal.  Left Ear: External ear normal.  Nose: Right sinus exhibits frontal sinus tenderness. Right sinus exhibits no maxillary sinus tenderness. Left sinus exhibits no maxillary sinus tenderness.    Mouth/Throat: Oropharynx is clear and moist. No oropharyngeal exudate.  Cardiovascular: Normal rate, regular rhythm and normal heart sounds.   No murmur heard. Pulmonary/Chest: Effort normal. No respiratory distress. She has wheezes (few, end exp). She exhibits no tenderness.  Abdominal: Soft. Bowel sounds are normal. She exhibits no distension. There is no tenderness.  Musculoskeletal: She exhibits no edema.    BP 122/70 mmHg  Pulse 91  Temp(Src) 98.3 F (36.8 C) (Oral)  Wt 158 lb (71.668 kg)  SpO2 96% Wt Readings from Last 3 Encounters:  11/18/14 158 lb (71.668 kg)  09/26/14 158 lb 8 oz (71.895 kg)  09/24/14 158 lb 4 oz (71.782 kg)    Lab Results  Component Value Date   WBC 7.1 08/25/2014   HGB 13.4 08/25/2014   HCT 39.1 08/25/2014   PLT 211 08/25/2014   GLUCOSE 100* 09/26/2014   CHOL 195 09/26/2014   TRIG 198.0* 09/26/2014   HDL 35.60* 09/26/2014   LDLCALC 120* 09/26/2014   ALT 23 08/25/2014   AST 18 08/25/2014   NA 141 09/26/2014   K 4.3 09/26/2014   CL 106 09/26/2014   CREATININE 0.88 09/26/2014   BUN 15 09/26/2014   CO2 28 09/26/2014   TSH 1.64 02/18/2014   INR 1.12 08/25/2014   HGBA1C 5.3 09/26/2014    Dg Bone Density  10/03/2014   Date of study: 10/03/2014 Exam: DUAL X-RAY ABSORPTIOMETRY (DXA) FOR BONE MINERAL DENSITY (  BMD) Instrument: [employer] Provider: PCP Indication: Screening for low BMD Comparison: none (please note that it is not possible to compare data from  different instruments) Clinical data: Pt is a postmenopausal 69 y.o. female without previous h/o  fracture.  Results:  Lumbar spine (L1-L4) Femoral neck (FN)  T-score  0.0 RFN: - 1.7 LFN: - 1.9   Assessment: the BMD is low according to the [facility] classification for  osteoporosis (see below). Fracture risk: moderate FRAX score: 10 year major osteoporotic risk: 11.6 %. 10 year hip fracture  risk: 3.2 %. The thresholds for treatment are 20% and 3%, respectively. Comments: the technical  quality of the study is good. Evaluation for secondary causes should be considered if clinically  indicated.  Recommend optimizing calcium (1200 mg/day) and vitamin D (800 IU/day)  intake.  Followup: Repeat BMD is appropriate after 2 years or after 1-2 years if  starting treatment.  WHO criteria for diagnosis of osteoporosis in postmenopausal women and in  men 31 y/o or older:  - normal: T-score -1.0 to + 1.0 - osteopenia/low bone density: T-score between -2.5 and -1.0 - osteoporosis: T-score below -2.5 - severe osteoporosis: T-score below -2.5 with history of fragility  fracture Note: although not part of the WHO classification, the presence of a  fragility fracture, regardless of the T-score, should be considered  diagnostic of osteoporosis, provided other causes for the fracture have  been excluded.  Treatment: The National Osteoporosis Foundation recommends that treatment  be considered in postmenopausal women and men age 15 or older with: 1. Hip or vertebral (clinical or morphometric) fracture 2. T-score of - 2.5 or lower at the spine or hip 3. 10-year fracture probability by FRAX of at least 20% for a major  osteoporotic fracture and 3% for a hip fracture  Philemon Kingdom, MD [facility]          Assessment & Plan:   Sinusitis, frontal - suspect seasonal allergic symptoms rather than bacterial infection - rx optimize medical care and reviewed importance of smoking cessation as continued abuse is risk for persisting this chronic, recurrent condition  Dysuria - +POC urine for blood - hx same with prior negative uro eval; send for Ucx and empirically treat with septra (other antibiotics allergy/intol reviewed) - if neg Ucx and persisting hematuria, re eval by uro would be appropriate given ongoing tobacco abuse  Smoker - 5 minutes today spent counseling patient on unhealthy effects of continued tobacco abuse and encouragement of cessation including medical options available to help the  patient quit smoking.  Problem List Items Addressed This Visit    None    Visit Diagnoses    Hematuria, microscopic    -  Primary    Urine frequency        Relevant Orders    POCT Urinalysis Dipstick (Completed)    Urine Culture    Low back pain without sciatica, unspecified back pain laterality        Relevant Orders    POCT Urinalysis Dipstick (Completed)    Urine Culture    Chronic frontal sinusitis        Relevant Medications    sulfamethoxazole-trimethoprim (BACTRIM DS,SEPTRA DS) 800-160 MG per tablet    fluticasone (FLONASE) 50 MCG/ACT nasal spray    Tobacco use disorder            [name], MD

## 2014-11-18 NOTE — Progress Notes (Signed)
Pre visit review using our clinic review tool, if applicable. No additional management support is needed unless otherwise documented below in the visit note. 

## 2014-11-20 LAB — URINE CULTURE

## 2015-02-10 ENCOUNTER — Other Ambulatory Visit: Payer: Self-pay

## 2015-02-10 DIAGNOSIS — Z1231 Encounter for screening mammogram for malignant neoplasm of breast: Secondary | ICD-10-CM

## 2015-02-12 ENCOUNTER — Ambulatory Visit (INDEPENDENT_AMBULATORY_CARE_PROVIDER_SITE_OTHER): Payer: Medicare Other | Admitting: Internal Medicine

## 2015-02-12 ENCOUNTER — Other Ambulatory Visit (INDEPENDENT_AMBULATORY_CARE_PROVIDER_SITE_OTHER): Payer: Medicare Other

## 2015-02-12 ENCOUNTER — Encounter: Payer: Self-pay | Admitting: Internal Medicine

## 2015-02-12 VITALS — BP 118/80 | HR 75 | Temp 98.3°F | Ht 61.75 in | Wt 158.2 lb

## 2015-02-12 DIAGNOSIS — Z23 Encounter for immunization: Secondary | ICD-10-CM | POA: Diagnosis not present

## 2015-02-12 DIAGNOSIS — K529 Noninfective gastroenteritis and colitis, unspecified: Secondary | ICD-10-CM | POA: Diagnosis not present

## 2015-02-12 DIAGNOSIS — I25119 Atherosclerotic heart disease of native coronary artery with unspecified angina pectoris: Secondary | ICD-10-CM

## 2015-02-12 LAB — BASIC METABOLIC PANEL
BUN: 14 mg/dL (ref 6–23)
CALCIUM: 9.5 mg/dL (ref 8.4–10.5)
CO2: 29 mEq/L (ref 19–32)
CREATININE: 0.93 mg/dL (ref 0.40–1.20)
Chloride: 106 mEq/L (ref 96–112)
GFR: 63.49 mL/min (ref >60.00)
GLUCOSE: 87 mg/dL (ref 70–99)
Potassium: 3.7 mEq/L (ref 3.5–5.1)
SODIUM: 141 meq/L (ref 135–145)

## 2015-02-12 MED ORDER — OMEPRAZOLE 20 MG PO CPDR: 20.0000 mg | DELAYED_RELEASE_CAPSULE | Freq: Every day | ORAL | Status: DC

## 2015-02-12 NOTE — Progress Notes (Signed)
   Subjective:    Patient ID: [name] [name], female    DOB: 1945-04-29, 69 y.o.   MRN: [id number]  HPI  Dr. Carlean Purl she believes has told her that the potassium loss may be due to her watery stools following cholecystectomy. She typically has 1-2 watery stools in the morning without any specific trigger. Specifically she cannot relate this to wheat or dairy intake. The only thing she eats or drinks in the morning would be coffee.  She has stopped her antihypertensive. She also stopped the potassium supplement when she stopped that..  She is on a low-salt diet; she'll have red meat 1-2 times per week. She avoids excess fried foods.   Review of Systems Unexplained weight loss, abdominal pain, significant dyspepsia, dysphagia, melena, rectal bleeding, or persistently small caliber stools are absent.     Objective:   Physical Exam   Pertinent or positive findings include: She has complete dentures. Resting head tremor is present. Breath sounds are decreased. She has mild DIP OA changes. There is trace tenting of the skin.  General appearance :adequately nourished; in no distress.  Eyes: No conjunctival inflammation or scleral icterus is present.  Oral exam:  Lips and gums are healthy appearing.There is no oropharyngeal erythema or exudate noted.  Heart:  Normal rate and regular rhythm. S1 and S2 normal without gallop, murmur, click, rub or other extra sounds    Lungs:Chest clear to auscultation; no wheezes, rhonchi,rales ,or rubs present.No increased work of breathing.   Abdomen: bowel sounds normal, soft and non-tender without masses, organomegaly or hernias noted.  No guarding or rebound.   Vascular : all pulses equal ; no bruits present.  Skin:Warm & dry.  Intact without suspicious lesions or rashes ; no tenting or jaundice   Lymphatic: No lymphadenopathy is noted about the head, neck, axilla.   Neuro: Strength, tone & DTRs normal.      Assessment & Plan:  #1  hypokalemia possibly secondary to watery stools post cholecystectomy  Plan: See orders and recommendations

## 2015-02-12 NOTE — Patient Instructions (Signed)
  Your next office appointment will be determined based upon review of your pending labs.  Those written interpretation of the lab results and instructions will be transmitted to you by My Chart or by mail for your records.  Critical results will be called.   Followup as needed for any active or acute issue. Please report any significant change in your symptoms. 

## 2015-02-12 NOTE — Progress Notes (Signed)
Pre visit review using our clinic review tool, if applicable. No additional management support is needed unless otherwise documented below in the visit note. 

## 2015-03-03 ENCOUNTER — Ambulatory Visit
Admission: RE | Admit: 2015-03-03 | Discharge: 2015-03-03 | Disposition: A | Discharge status: Home / Self Care | Payer: Medicare Other | Source: Ambulatory Visit

## 2015-03-03 DIAGNOSIS — Z1231 Encounter for screening mammogram for malignant neoplasm of breast: Secondary | ICD-10-CM

## 2015-03-21 ENCOUNTER — Ambulatory Visit (INDEPENDENT_AMBULATORY_CARE_PROVIDER_SITE_OTHER): Payer: Medicare Other | Admitting: Internal Medicine

## 2015-03-21 ENCOUNTER — Encounter: Payer: Self-pay | Admitting: Internal Medicine

## 2015-03-21 VITALS — BP 148/88 | HR 90 | Temp 98.0°F | Resp 18 | Wt 159.0 lb

## 2015-03-21 DIAGNOSIS — I25119 Atherosclerotic heart disease of native coronary artery with unspecified angina pectoris: Secondary | ICD-10-CM | POA: Diagnosis not present

## 2015-03-21 DIAGNOSIS — J209 Acute bronchitis, unspecified: Secondary | ICD-10-CM | POA: Diagnosis not present

## 2015-03-21 MED ORDER — PREDNISONE 10 MG PO TABS: ORAL_TABLET | ORAL | Status: DC

## 2015-03-21 MED ORDER — AZITHROMYCIN 250 MG PO TABS: ORAL_TABLET | ORAL | Status: DC

## 2015-03-21 NOTE — Patient Instructions (Signed)
Plain Mucinex (NOT D) for thick secretions ;force NON dairy fluids .   Nasal cleansing in the shower as discussed with lather of mild shampoo.After 10 seconds wash off lather while  exhaling through nostrils. Make sure that all residual soap is removed to prevent irritation.  Flonase OR Nasacort AQ 1 spray in each nostril twice a day as needed. Use the "crossover" technique into opposite nostril spraying toward opposite ear @ 45 degree angle, not straight up into nostril.  Plain Allegra (NOT D )  160 daily , Loratidine 10 mg , OR Zyrtec 10 mg @ bedtime  as needed for itchy eyes & sneezing.  Anoro  one inhalation daily; gargle and spit after use. Lot [id number] Exp date:10/18

## 2015-03-21 NOTE — Progress Notes (Signed)
   Subjective:    Patient ID: [name] [name], female    DOB: 02/03/1946, 69 y.o.   MRN: [id number]  HPI   She has had a paroxysmal nonproductive cough since Thanksgiving in the context of postnasal drainage. Initially she had some chills but those have resolved. She's had intermittent frontal headache without  persistent pain in the frontal sinus areas. She has had some wheezing. She also has itchy eyes.   She continues to smoke and expresses no interest in stopping. She did have a CT scan in May of this year which revealed no suspicious lesions.  She's been using her albuterol 3-4 times per week.  Review of Systems Facial pain , nasal purulence, sore throat , otic pain or otic discharge denied. No fever or sweats. She has no sneezing or angioedema.     Objective:   Physical Exam  Pertinent or positive findings include: she has complete dentures. There is marked nasal erythema. There's an intermittent tremor of her head. Heart sounds and breath sounds are distant.  General appearance:Adequately nourished; no acute distress or increased work of breathing is present.    Lymphatic: No  lymphadenopathy about the head, neck, or axilla .  Eyes: No conjunctival inflammation or lid edema is present. There is no scleral icterus.  Ears:  External ear exam shows no significant lesions or deformities.  Otoscopic examination reveals clear canals, tympanic membranes are intact bilaterally without bulging, retraction, inflammation or discharge.  Nose:  External nasal examination shows no deformity or inflammation.   Oral exam: Dental hygiene is good; lips and gums are healthy appearing.There is no oropharyngeal erythema or exudate .  Neck:  No deformities, thyromegaly, masses, or tenderness noted.   Supple with full range of motion without pain.   Heart:  Normal rate and regular rhythm. S1 and S2 normal without gallop, murmur, click, rub or other extra sounds.   Lungs:Chest clear to  auscultation; no wheezes, rhonchi,rales ,or rubs present.  Extremities:  No cyanosis, edema, or clubbing  noted    Skin: Warm & dry w/o tenting or jaundice. No significant lesions or rash.     Assessment & Plan:  #1 acute bronchitis w/o bronchospasm #2 rhinitis Plan: See orders and recommendations

## 2015-03-21 NOTE — Progress Notes (Signed)
Pre visit review using our clinic review tool, if applicable. No additional management support is needed unless otherwise documented below in the visit note. 

## 2015-03-28 ENCOUNTER — Telehealth: Payer: Self-pay | Admitting: *Deleted

## 2015-03-28 ENCOUNTER — Other Ambulatory Visit: Payer: Self-pay | Admitting: Hematology and Oncology

## 2015-03-28 MED ORDER — AMOXICILLIN-POT CLAVULANATE 875-125 MG PO TABS: 1.0000 | ORAL_TABLET | Freq: Two times a day (BID) | ORAL | Status: DC

## 2015-03-28 NOTE — Telephone Encounter (Signed)
Notified pt md sent in another antibiotic to [facility]...Johny Chess

## 2015-03-28 NOTE — Telephone Encounter (Signed)
Pt left msg on triage stating saw Dr. Linna Darner for sinuitis completed all medication that was given but not feeling any better. Wanting to get a stronger antibiotic sent to pharmacy....Johny Chess

## 2015-03-28 NOTE — Telephone Encounter (Signed)
Augmentin 875/125 q 12 hrs after a meal #14

## 2015-04-24 ENCOUNTER — Telehealth: Payer: Self-pay | Admitting: Hematology and Oncology

## 2015-04-24 NOTE — Telephone Encounter (Signed)
Spoke to patient and she is aware of the change to her appointment date/time due to the provider being on call.

## 2015-04-28 ENCOUNTER — Encounter: Payer: Self-pay | Admitting: Family Medicine

## 2015-04-28 ENCOUNTER — Ambulatory Visit (INDEPENDENT_AMBULATORY_CARE_PROVIDER_SITE_OTHER): Payer: Medicare Other | Admitting: Family Medicine

## 2015-04-28 VITALS — BP 122/84 | HR 96 | Temp 97.8°F | Ht 61.75 in | Wt 159.6 lb

## 2015-04-28 DIAGNOSIS — N3001 Acute cystitis with hematuria: Secondary | ICD-10-CM | POA: Diagnosis not present

## 2015-04-28 DIAGNOSIS — J309 Allergic rhinitis, unspecified: Secondary | ICD-10-CM

## 2015-04-28 DIAGNOSIS — N390 Urinary tract infection, site not specified: Secondary | ICD-10-CM | POA: Insufficient documentation

## 2015-04-28 DIAGNOSIS — R300 Dysuria: Secondary | ICD-10-CM

## 2015-04-28 LAB — URINALYSIS, MICROSCOPIC ONLY

## 2015-04-28 LAB — POCT URINALYSIS DIPSTICK
Bilirubin, UA: NEGATIVE
Glucose, UA: NEGATIVE
KETONES UA: NEGATIVE
Nitrite, UA: NEGATIVE
PH UA: 7
PROTEIN UA: NEGATIVE
SPEC GRAV UA: 1.01
UROBILINOGEN UA: 0.2

## 2015-04-28 MED ORDER — CEPHALEXIN 500 MG PO CAPS: 500.0000 mg | ORAL_CAPSULE | Freq: Two times a day (BID) | ORAL | Status: DC

## 2015-04-28 NOTE — Assessment & Plan Note (Signed)
Symptoms and UA consistent with UTI. We'll treat with Keflex. Sent for urine culture and microscopy. Given return precautions.

## 2015-04-28 NOTE — Progress Notes (Signed)
Patient ID: [name] [name], female   DOB: 1946/03/25, 70 y.o.   MRN: [id number]  Tommi Rumps, MD Phone: [phone]  [name] [name] is a 70 y.o. female who presents today for same-day visit.  UTI: Patient notes several days of dysuria and some frequency and urgency. She notes mild discomfort in her bilateral lower back. No abdominal pain or fevers. Notes may be some chills earlier today. No vaginal itching or discharge. He notes this feels like her typical UTIs. Denies numbness, weakness, saddle anesthesia, bowel and bladder incontinence, fevers, and history of cancer.  Patient also notes 3+ months of postnasal drip, nasal congestion, and mildly productive cough. Has been treated with a Z-Pak, prednisone, and Augmentin previously. She does not take any inhalers for her COPD. She does not rhinorrhea and postnasal drip. No shortness of breath and chest pain.  PMH: Smoker   ROS see history of present illness  Objective  Physical Exam Filed Vitals:   04/28/15 1005  BP: 122/84  Pulse: 96  Temp: 97.8 F (36.6 C)    BP Readings from Last 3 Encounters:  04/28/15 122/84  03/21/15 148/88  02/12/15 118/80   Wt Readings from Last 3 Encounters:  04/28/15 159 lb 9.6 oz (72.394 kg)  03/21/15 159 lb (72.122 kg)  02/12/15 158 lb 4 oz (71.782 kg)    Physical Exam  Constitutional: She is well-developed, well-nourished, and in no distress.  HENT:  Head: Normocephalic and atraumatic.  Right Ear: External ear normal.  Left Ear: External ear normal.  Mouth/Throat: Oropharynx is clear and moist. No oropharyngeal exudate.  Eyes: Conjunctivae are normal. Pupils are equal, round, and reactive to light.  Neck: Neck supple.  Cardiovascular: Normal rate, regular rhythm and normal heart sounds.  Exam reveals no gallop and no friction rub.   No murmur heard. Pulmonary/Chest: Effort normal and breath sounds normal. No respiratory distress. She has no wheezes. She has no rales.  Abdominal:  Soft. She exhibits no distension. There is no tenderness. There is no rebound and no guarding.  Lymphadenopathy:    She has no cervical adenopathy.  Neurological: She is alert.  5 out of 5 strength in bilateral quads, hamstrings, plantar flexion, and dorsiflexion, sensation to light touch intact bilaterally extremities, 2+ patellar reflexes  Skin: Skin is warm and dry. She is not diaphoretic.     Assessment/Plan: Please see individual problem list.  UTI (urinary tract infection) Symptoms and UA consistent with UTI. We'll treat with Keflex. Sent for urine culture and microscopy. Given return precautions.  Allergic rhinitis Patient's cough and postnasal drip likely related to her allergic rhinitis. She's gone through treatment with 2 antibiotics and prednisone. She has a benign lung exam. Her oxygenation is normal. Doubt COPD exacerbation or reactive airway disease. Unlikely pneumonia. Discussed treatment with Allegra and Flonase. Given return precautions.    Orders Placed This Encounter  Procedures  . Urine Culture  . Urine Microscopic Only  . POCT Urinalysis Dipstick    Meds ordered this encounter  Medications  . cephALEXin (KEFLEX) 500 MG capsule    Sig: Take 1 capsule (500 mg total) by mouth 2 (two) times daily.    Dispense:  14 capsule    Refill:  0     Tommi Rumps

## 2015-04-28 NOTE — Assessment & Plan Note (Addendum)
Patient's cough and postnasal drip likely related to her allergic rhinitis. She's gone through treatment with 2 antibiotics and prednisone. She has a benign lung exam. Her oxygenation is normal. Doubt COPD exacerbation or reactive airway disease. Unlikely pneumonia. Discussed treatment with Allegra and Flonase. Given return precautions.

## 2015-04-28 NOTE — Progress Notes (Signed)
Pre visit review using our clinic review tool, if applicable. No additional management support is needed unless otherwise documented below in the visit note. 

## 2015-04-28 NOTE — Patient Instructions (Signed)
Nice to meet you. You have a UTI. We will treat this with Keflex. You need to take a probiotic or eat yogurt while taking this. You should start taking Flonase for your allergies. Continue the Allegra. If you develop abdominal pain, fevers, numbness, weakness, loss of bowel or bladder function, numbness between her legs, cough productive of blood, shortness breath, chest pain, or any new or change in symptoms please seek medical attention.

## 2015-04-30 LAB — URINE CULTURE

## 2015-05-07 ENCOUNTER — Encounter: Payer: Self-pay | Admitting: Surgical

## 2015-05-21 ENCOUNTER — Encounter: Payer: Self-pay | Admitting: Family Medicine

## 2015-05-21 ENCOUNTER — Ambulatory Visit (INDEPENDENT_AMBULATORY_CARE_PROVIDER_SITE_OTHER): Payer: Medicare Other | Admitting: Family Medicine

## 2015-05-21 VITALS — BP 118/86 | HR 99 | Temp 98.5°F | Ht 61.75 in | Wt 159.0 lb

## 2015-05-21 DIAGNOSIS — N3001 Acute cystitis with hematuria: Secondary | ICD-10-CM

## 2015-05-21 DIAGNOSIS — R300 Dysuria: Secondary | ICD-10-CM

## 2015-05-21 LAB — POCT URINALYSIS DIPSTICK
GLUCOSE UA: 100
Ketones, UA: 5
NITRITE UA: POSITIVE
Protein, UA: 15
Spec Grav, UA: >=1.03
UROBILINOGEN UA: 4
pH, UA: 5.5

## 2015-05-21 LAB — URINALYSIS, MICROSCOPIC ONLY

## 2015-05-21 MED ORDER — CEFDINIR 300 MG PO CAPS: 300.0000 mg | ORAL_CAPSULE | Freq: Two times a day (BID) | ORAL | Status: DC

## 2015-05-21 NOTE — Progress Notes (Signed)
Patient ID: [name] [name], female   DOB: Jun 19, 1945, 70 y.o.   MRN: [id number]  Tommi Rumps, MD Phone: [phone]  [name] [name] is a 70 y.o. female who presents today for  Same-day visit.   UTI: patient notes several days worth of low back discomfort and lower abdominal discomfort that is mild. Notes dysuria as well. No frequency or urgency. No hematuria. Has been taking azo. No N/V/D. No fever. Has history of hysterectomy, BSO, appendectomy, and cholescystectomy. Notes this feels like her typical UTIs. She was recently treated for UTI in January and notes she did improve though her symptoms remained mildly present.  PMH: smoker   ROS see HPI  Objective  Physical Exam Filed Vitals:   05/21/15 0920  BP: 118/86  Pulse: 99  Temp: 98.5 F (36.9 C)    BP Readings from Last 3 Encounters:  05/21/15 118/86  04/28/15 122/84  03/21/15 148/88   Wt Readings from Last 3 Encounters:  05/21/15 159 lb (72.122 kg)  04/28/15 159 lb 9.6 oz (72.394 kg)  03/21/15 159 lb (72.122 kg)    Physical Exam  Constitutional: She is well-developed, well-nourished, and in no distress.  HENT:  Head: Normocephalic and atraumatic.  Cardiovascular: Normal rate, regular rhythm and normal heart sounds.  Exam reveals no gallop and no friction rub.   No murmur heard. Pulmonary/Chest: Effort normal and breath sounds normal. No respiratory distress. She has no wheezes. She has no rales.  Abdominal: Soft. Bowel sounds are normal. She exhibits no distension. There is no tenderness. There is no rebound and no guarding.  Mild CVA discomfort  Musculoskeletal: She exhibits no edema.  Neurological: She is alert. Gait normal.  Skin: Skin is warm and dry. She is not diaphoretic.     Assessment/Plan: Please see individual problem list.  UTI (urinary tract infection) Symptoms and UA consistent with UTI. Prior urine culture was sensitive to cephalosporins. Patient was previously on Keflex. We'll treat her  with Omnicef. We'll send for culture and microscopy. She will continue to monitor. She's given return precautions.    Orders Placed This Encounter  Procedures  . Urine Culture  . Urine Microscopic Only  . POCT Urinalysis Dipstick    Meds ordered this encounter  Medications  . cefdinir (OMNICEF) 300 MG capsule    Sig: Take 1 capsule (300 mg total) by mouth 2 (two) times daily.    Dispense:  14 capsule    Refill:  0    Tommi Rumps

## 2015-05-21 NOTE — Patient Instructions (Signed)
Nice to see you. You have a UTI. We will treat this with Omnicef. We will send it for culture to confirm infection. if you develop abdominal pain, nausea, vomiting, fevers, increasing back pain, numbness, weakness, loss of bowel or bladder function , no strenuous legs, or any new changes please seek medical attention.

## 2015-05-21 NOTE — Progress Notes (Signed)
Pre visit review using our clinic review tool, if applicable. No additional management support is needed unless otherwise documented below in the visit note. 

## 2015-05-21 NOTE — Assessment & Plan Note (Signed)
Symptoms and UA consistent with UTI. Prior urine culture was sensitive to cephalosporins. Patient was previously on Keflex. We'll treat her with Omnicef. We'll send for culture and microscopy. She will continue to monitor. She's given return precautions.

## 2015-05-23 LAB — URINE CULTURE: Colony Count: 5000

## 2015-05-26 NOTE — Addendum Note (Signed)
Addended by: Crecencio Mc on: 05/26/2015 01:23 PM   Modules accepted: Orders

## 2015-06-03 ENCOUNTER — Telehealth: Payer: Self-pay | Admitting: Internal Medicine

## 2015-06-03 NOTE — Telephone Encounter (Signed)
Pt called state that she need refill for omeprazole (PRILOSEC) 20 MG capsule and Valium. Pt stated pharmacy trying to get a refill but never heard anything from our office. Please help, pt is out of these med and has a CPE stet up on 10/02/15.

## 2015-06-04 MED ORDER — DIAZEPAM 5 MG PO TABS: 2.5000 mg | ORAL_TABLET | Freq: Two times a day (BID) | ORAL | Status: DC | PRN

## 2015-06-04 MED ORDER — OMEPRAZOLE 20 MG PO CPDR: 20.0000 mg | DELAYED_RELEASE_CAPSULE | Freq: Every day | ORAL | Status: DC

## 2015-06-04 NOTE — Telephone Encounter (Signed)
LVM informing pt. rx has been faxed to pharm.

## 2015-06-04 NOTE — Telephone Encounter (Signed)
Valium rx printed 

## 2015-06-04 NOTE — Telephone Encounter (Signed)
Please advise on Valium, Omeprazole has been sent to pharm.

## 2015-06-12 ENCOUNTER — Other Ambulatory Visit: Payer: Self-pay | Admitting: Hematology and Oncology

## 2015-06-13 ENCOUNTER — Other Ambulatory Visit: Payer: Medicare Other

## 2015-06-13 ENCOUNTER — Ambulatory Visit: Payer: Medicare Other | Admitting: Hematology and Oncology

## 2015-06-16 ENCOUNTER — Ambulatory Visit (HOSPITAL_BASED_OUTPATIENT_CLINIC_OR_DEPARTMENT_OTHER): Payer: Medicare Other | Admitting: Hematology and Oncology

## 2015-06-16 ENCOUNTER — Encounter: Payer: Self-pay | Admitting: Hematology and Oncology

## 2015-06-16 ENCOUNTER — Telehealth: Payer: Self-pay | Admitting: Hematology and Oncology

## 2015-06-16 ENCOUNTER — Other Ambulatory Visit: Payer: Self-pay | Admitting: Hematology and Oncology

## 2015-06-16 ENCOUNTER — Telehealth: Payer: Self-pay | Admitting: *Deleted

## 2015-06-16 ENCOUNTER — Other Ambulatory Visit (HOSPITAL_BASED_OUTPATIENT_CLINIC_OR_DEPARTMENT_OTHER): Payer: Medicare Other

## 2015-06-16 ENCOUNTER — Ambulatory Visit (HOSPITAL_BASED_OUTPATIENT_CLINIC_OR_DEPARTMENT_OTHER): Payer: Medicare Other

## 2015-06-16 ENCOUNTER — Telehealth: Payer: Self-pay | Admitting: Hematology

## 2015-06-16 DIAGNOSIS — F413 Other mixed anxiety disorders: Secondary | ICD-10-CM | POA: Insufficient documentation

## 2015-06-16 LAB — CBC WITH DIFFERENTIAL/PLATELET
BASO%: 0.3 % (ref 0.0–2.0)
BASOS ABS: 0 10*3/uL (ref 0.0–0.1)
EOS%: 1.3 % (ref 0.0–7.0)
Eosinophils Absolute: 0.1 10*3/uL (ref 0.0–0.5)
HEMATOCRIT: 40.3 % (ref 34.8–46.6)
HGB: 13.8 g/dL (ref 11.6–15.9)
LYMPH#: 1.4 10*3/uL (ref 0.9–3.3)
LYMPH%: 18 % (ref 14.0–49.7)
MCH: 33.1 pg (ref 25.1–34.0)
MCHC: 34.2 g/dL (ref 31.5–36.0)
MCV: 96.6 fL (ref 79.5–101.0)
MONO#: 0.7 10*3/uL (ref 0.1–0.9)
MONO%: 9.1 % (ref 0.0–14.0)
NEUT#: 5.6 10*3/uL (ref 1.5–6.5)
NEUT%: 71.3 % (ref 38.4–76.8)
PLATELETS: 234 10*3/uL (ref 145–400)
RBC: 4.17 10*6/uL (ref 3.70–5.45)
RDW: 12.7 % (ref 11.2–14.5)
WBC: 7.9 10*3/uL (ref 3.9–10.3)

## 2015-06-16 LAB — FERRITIN: Ferritin: 54 ng/ml (ref 9–269)

## 2015-06-16 MED ORDER — DIAZEPAM 5 MG PO TABS: 2.5000 mg | ORAL_TABLET | Freq: Two times a day (BID) | ORAL | Status: DC | PRN

## 2015-06-16 NOTE — Telephone Encounter (Signed)
Per staff message and POF I have scheduled appts. Advised scheduler of appts. JMW  

## 2015-06-16 NOTE — Telephone Encounter (Signed)
per pof to sch pt appt-MW sch phleb appt-gave pt copy of avs

## 2015-06-16 NOTE — Patient Instructions (Signed)

## 2015-06-16 NOTE — Progress Notes (Signed)
Therapeutic phlebotomy performed at 1540 to 1547 with 16G needle using Left AC, 350g removed.  Pt tolerated well and is having a snack during obs period.

## 2015-06-16 NOTE — Telephone Encounter (Signed)
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

## 2015-06-16 NOTE — Assessment & Plan Note (Signed)
She does not need frequent phlebotomy. Ferritin level is pending but last year her level was only 83 I suspect she will only need for blood only once a year. We will proceed with 1 unit of blood removed today.

## 2015-06-16 NOTE — Assessment & Plan Note (Signed)
She complained of anxiety and is concerned because she ran out of Valium. According to the electronic records, her primary care physician's office had issued a prescription of Valium on 06/04/15 but she claimed it was never sent to her pharmacist. I recommend she calls her PCP office for prescription refill and I will give her only 10 tablets today

## 2015-06-16 NOTE — Progress Notes (Signed)
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 She was placed phlebotomy periodically to keep ferritin under 575 INTERVAL HISTORY: [name] [name] 70 y.o. female returns for further follow-up. The patient has chronic anxiety disorder and is anxious today. She requested prescription refill She denies side effects of phlebotomy She has no further complaints today  I have reviewed the past medical history, past surgical history, social history and family history with the patient and they are unchanged from previous note.  ALLERGIES:  is allergic to ciprofloxacin; codeine; macrobid; morphine and related; and prevnar.  MEDICATIONS:  Current Outpatient Prescriptions  Medication Sig Dispense Refill  . aspirin EC 81 MG tablet Take 1 tablet (81 mg total) by mouth daily. 150 tablet 2  . diazepam (VALIUM) 5 MG tablet Take 0.5-1 tablets (2.5-5 mg total) by mouth every 12 (twelve) hours as needed for anxiety. 10 tablet 0  . fluticasone (FLONASE) 50 MCG/ACT nasal spray Place 1 spray into both nostrils daily. 16 g 2  . omeprazole (PRILOSEC) 20 MG capsule Take 1 capsule (20 mg total) by mouth daily. 90 capsule 1  . albuterol (PROVENTIL) (2.5 MG/3ML) 0.083% nebulizer solution Take 3 mLs (2.5 mg total) by nebulization every 6 (six) hours as needed for wheezing or shortness of breath. (Patient not taking: Reported on 06/16/2015) 75 mL 2  . colestipol (COLESTID) 5 G granules Take 5 g by mouth daily as needed (diarrhea). Reported on 06/16/2015    . [DISCONTINUED] potassium chloride (KLOR-CON) 10 MEQ CR tablet Take 1 tablet (10 mEq total) by mouth daily. 30 tablet 5   No current facility-administered medications for this visit.     REVIEW OF SYSTEMS:   Constitutional: Denies fevers, chills or  night sweats Eyes: Denies blurriness of vision Ears, nose, mouth, throat, and face: Denies mucositis or sore throat Respiratory: Denies cough, dyspnea or wheezes Cardiovascular: Denies palpitation, chest discomfort or lower extremity swelling Gastrointestinal:  Denies nausea, heartburn or change in bowel habits Skin: Denies abnormal skin rashes Lymphatics: Denies new lymphadenopathy or easy bruising Neurological:Denies numbness, tingling or new weaknesses Behavioral/Psych: Mood is stable, no new changes  All other systems were reviewed with the patient and are negative.  PHYSICAL EXAMINATION: ECOG PERFORMANCE STATUS: 0 - Asymptomatic  Filed Vitals:   06/16/15 1441  BP: 139/84  Pulse: 88  Temp: 98.1 F (36.7 C)  Resp: 17   Filed Weights   06/16/15 1441  Weight: 158 lb 8 oz (71.895 kg)    GENERAL:alert, no distress and comfortable SKIN: skin color, texture, turgor are normal, no rashes or significant lesions EYES: normal, Conjunctiva are pink and non-injected, sclera clear Musculoskeletal:no cyanosis of digits and no clubbing  NEURO: alert & oriented x 3 with fluent speech, no focal motor/sensory deficits  LABORATORY DATA:  I have reviewed the data as listed Results for orders placed or performed in visit on 06/16/15 (from the past 48 hour(s))  CBC with Differential/Platelet     Status: None   Collection Time: 06/16/15  2:31 PM  Result Value Ref Range   WBC 7.9 3.9 - 10.3 10e3/uL   NEUT# 5.6 1.5 - 6.5 10e3/uL   HGB 13.8 11.6 - 15.9 g/dL   HCT 40.3 34.8 - 46.6 %   Platelets 234 145 - 400 10e3/uL   MCV 96.6 79.5 - 101.0 fL  MCH 33.1 25.1 - 34.0 pg   MCHC 34.2 31.5 - 36.0 g/dL   RBC 4.17 3.70 - 5.45 10e6/uL   RDW 12.7 11.2 - 14.5 %   lymph# 1.4 0.9 - 3.3 10e3/uL   MONO# 0.7 0.1 - 0.9 10e3/uL   Eosinophils Absolute 0.1 0.0 - 0.5 10e3/uL   Basophils Absolute 0.0 0.0 - 0.1 10e3/uL   NEUT% 71.3 38.4 - 76.8 %   LYMPH% 18.0 14.0 - 49.7 %   MONO% 9.1 0.0 - 14.0 %   EOS%  1.3 0.0 - 7.0 %   BASO% 0.3 0.0 - 2.0 %    Lab Results  Component Value Date   WBC 7.9 06/16/2015   HGB 13.8 06/16/2015   HCT 40.3 06/16/2015   MCV 96.6 06/16/2015   PLT 234 06/16/2015    ASSESSMENT & PLAN:  Hereditary hemochromatosis She does not need frequent phlebotomy. Ferritin level is pending but last year her level was only 83 I suspect she will only need for blood only once a year. We will proceed with 1 unit of blood removed today.    Other mixed anxiety disorders She complained of anxiety and is concerned because she ran out of Valium. According to the electronic records, her primary care physician's office had issued a prescription of Valium on 06/04/15 but she claimed it was never sent to her pharmacist. I recommend she calls her PCP office for prescription refill and I will give her only 10 tablets today   All questions were answered. The patient knows to call the clinic with any problems, questions or concerns. No barriers to learning was detected.  I spent 15 minutes counseling the patient face to face. The total time spent in the appointment was 20 minutes and more than 50% was on counseling.     [facility], [name] Kosa, MD 3/20/20173:02 PM

## 2015-06-23 ENCOUNTER — Telehealth: Payer: Self-pay | Admitting: Pulmonary Disease

## 2015-06-23 NOTE — Telephone Encounter (Signed)
Per Judson Roch, NP, Dr. Karsten Ro is asking for pt to be seen ASAP for recent CT Chest findings.  Spoke with pt.  Consult scheduled with Dr. Corrie Dandy on 06/25/15 at 3 pm.  Pt asked to arrive at 2:30 pm with all medications.  Office address and phone number provided.  Pt verbalized understanding and voiced no further questions or concerns at this time.

## 2015-06-25 ENCOUNTER — Telehealth: Payer: Self-pay | Admitting: Pulmonary Disease

## 2015-06-25 ENCOUNTER — Other Ambulatory Visit (INDEPENDENT_AMBULATORY_CARE_PROVIDER_SITE_OTHER): Payer: Medicare Other

## 2015-06-25 ENCOUNTER — Ambulatory Visit (INDEPENDENT_AMBULATORY_CARE_PROVIDER_SITE_OTHER): Payer: Medicare Other | Admitting: Pulmonary Disease

## 2015-06-25 ENCOUNTER — Encounter: Payer: Self-pay | Admitting: Pulmonary Disease

## 2015-06-25 VITALS — BP 128/78 | HR 95 | Ht 61.75 in | Wt 156.6 lb

## 2015-06-25 DIAGNOSIS — D689 Coagulation defect, unspecified: Secondary | ICD-10-CM

## 2015-06-25 DIAGNOSIS — J439 Emphysema, unspecified: Secondary | ICD-10-CM

## 2015-06-25 DIAGNOSIS — Z720 Tobacco use: Secondary | ICD-10-CM | POA: Diagnosis not present

## 2015-06-25 DIAGNOSIS — R911 Solitary pulmonary nodule: Secondary | ICD-10-CM | POA: Diagnosis not present

## 2015-06-25 DIAGNOSIS — R918 Other nonspecific abnormal finding of lung field: Secondary | ICD-10-CM

## 2015-06-25 DIAGNOSIS — Z01812 Encounter for preprocedural laboratory examination: Secondary | ICD-10-CM

## 2015-06-25 DIAGNOSIS — Z5181 Encounter for therapeutic drug level monitoring: Secondary | ICD-10-CM

## 2015-06-25 LAB — PROTIME-INR
INR: 1.1 ratio — ABNORMAL HIGH (ref 0.8–1.0)
PROTHROMBIN TIME: 11.7 s (ref 9.6–13.1)

## 2015-06-25 LAB — CBC WITH DIFFERENTIAL/PLATELET
BASOS ABS: 0 10*3/uL (ref 0.0–0.1)
BASOS PCT: 0.2 % (ref 0.0–3.0)
Eosinophils Absolute: 0.1 10*3/uL (ref 0.0–0.7)
Eosinophils Relative: 1.1 % (ref 0.0–5.0)
HEMATOCRIT: 37.5 % (ref 36.0–46.0)
Hemoglobin: 12.9 g/dL (ref 12.0–15.0)
LYMPHS ABS: 1.4 10*3/uL (ref 0.7–4.0)
LYMPHS PCT: 20.9 % (ref 12.0–46.0)
MCHC: 34.3 g/dL (ref 30.0–36.0)
MCV: 95.8 fl (ref 78.0–100.0)
MONOS PCT: 8.2 % (ref 3.0–12.0)
Monocytes Absolute: 0.6 10*3/uL (ref 0.1–1.0)
NEUTROS ABS: 4.7 10*3/uL (ref 1.4–7.7)
NEUTROS PCT: 69.6 % (ref 43.0–77.0)
PLATELETS: 279 10*3/uL (ref 150.0–400.0)
RBC: 3.92 Mil/uL (ref 3.87–5.11)
RDW: 13.7 % (ref 11.5–15.5)
WBC: 6.8 10*3/uL (ref 4.0–10.5)

## 2015-06-25 LAB — COMPREHENSIVE METABOLIC PANEL
ALT: 19 U/L (ref 0–35)
AST: 17 U/L (ref 0–37)
Albumin: 4.3 g/dL (ref 3.5–5.2)
Alkaline Phosphatase: 107 U/L (ref 39–117)
BUN: 13 mg/dL (ref 6–23)
CHLORIDE: 106 meq/L (ref 96–112)
CO2: 27 meq/L (ref 19–32)
Calcium: 9.8 mg/dL (ref 8.4–10.5)
Creatinine, Ser: 1.12 mg/dL (ref 0.40–1.20)
GFR: 51.18 mL/min — AB (ref >60.00)
GLUCOSE: 88 mg/dL (ref 70–99)
POTASSIUM: 4.5 meq/L (ref 3.5–5.1)
Sodium: 140 mEq/L (ref 135–145)
Total Bilirubin: 0.3 mg/dL (ref 0.2–1.2)
Total Protein: 7.1 g/dL (ref 6.0–8.3)

## 2015-06-25 LAB — APTT: APTT: 30 s (ref 23.4–32.7)

## 2015-06-25 NOTE — Assessment & Plan Note (Addendum)
Ct scan abdomen  (06/10/15) no abdominal pathology. Incidental cavitary mass over the left lower lobe, posterior, approx 3 x 3 cm. Incidental finding. Pt with copd,likely moderate, still smokes.  Differentials : CA vs Infection (less likely). Doubt related to urinary issues. Plan: 1. Chest ct scan with contrast. D/w pt and daughters re: risk of IV contrast and they accept. Plan for chest ct scan on 3/30.  2. Cbc, cmp, coags, blood culture 3. If no other lesions seen in the chest ct scan, pt will need percutaneous lung Bx. They accept risk of procedure including ptx,bleeding, hypoxemia.   I personally reviewed previous images (Chest Xray, Chest Ct scan) done on this patient. I reviewed the reports on the images as well.  Showed family images as well.

## 2015-06-25 NOTE — Progress Notes (Signed)
Subjective:    Patient ID: [name] [name], female    DOB: 04-25-1945, 70 y.o.   MRN: [id number]  HPI  This is the case of [name] [name], 70 y.o. Female, who was referred by Dr. Vikki [name] in consultation regarding abnormal ct scan.   As you very well know, [name], still smokes 1/2 PPD. Denies SOB. Has mild COPD.   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   The last month, patient has been having urinary symptoms. Has painful urination, bloody urine. She saw her primary care doctor and was prescribed antibiotics. Cultures remained negative. She ended up seeing a urologist. Urologist did a CT scan of the abdomen. Scan of the abdomen was negative as far as the abdomen was concerned but incidentally found a cavitary lung mass, left lower lobe, posteriorly located, 3 x 3 cm. Because of the abnormal CT scan, she was referred to [place].  She still has urinary issues. Currently on antibiotics.       Review of Systems  Constitutional: Negative.  Negative for fever and unexpected weight change.  HENT: Positive for congestion and postnasal drip. Negative for dental problem, ear pain, nosebleeds, rhinorrhea, sinus pressure, sneezing, sore throat and trouble swallowing.   Eyes: Positive for itching. Negative for redness.  Respiratory: Positive for cough, shortness of breath and wheezing. Negative for chest tightness.   Cardiovascular: Negative.  Negative for palpitations and leg swelling.  Gastrointestinal: Negative.  Negative for nausea and vomiting.  Endocrine: Negative.   Genitourinary: Negative.  Negative for dysuria.       Bloody urine. Painful urination  Musculoskeletal: Positive for arthralgias. Negative for joint swelling.  Skin: Negative.  Negative for rash.  Allergic/Immunologic: Positive for environmental allergies.  Neurological: Negative.  Negative for headaches.  Hematological: Bruises/bleeds easily.  Psychiatric/Behavioral:  Negative.  Negative for dysphoric mood. The patient is not nervous/anxious.      Past Medical History  Diagnosis Date  . Hereditary hemochromatosis ([place]) 2004  . ASTHMA   . HYPERTENSION   . GERD   . Chronic kidney disease   . Arthritis     left shoulder   . Headache(784.0)     sinus headaches   . H/O hiatal hernia   . Anxiety   . Hyperlipidemia   . CAD (coronary artery disease)   . Depression   . Hypertension   . Urinary tract infection   . COPD (chronic obstructive pulmonary disease) with emphysema ([place])     mild dz on PFTs 11/2013  . 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 Hx of adenomatous polyp of colon 07/25/2014  . Osteopenia 10/07/2014    DEXA @ [facility] 09/2014: -1.9 L fem  . 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 Hypertension Mother   . Heart disease Mother   . Breast cancer Mother 19  . Diabetes Father   . Heart disease Father   . Alcohol abuse Other   . Arthritis Other   . Cirrhosis Brother   . Cirrhosis Brother   . Cirrhosis Brother   . Lung cancer Brother   . Colon cancer Neg Hx   . Colon polyps Neg Hx   . Esophageal cancer Neg Hx   . Rectal cancer Neg Hx   . Stomach cancer Neg Hx  Past Surgical History  Procedure Laterality Date  . Cholecystectomy  03/29/09  . Appendectomy  03/29/81  . Abdominal hysterectomy  03/29/81  . Bladder tack  03/29/2002  . Other surgical history      cyst removed from intestines to ovary    . Other surgical history      tendon surgery left wrist   . Cystoscopy  01/24/14    NEGATIVE;Dr Ottelin  . Colonoscopy w/ biopsies    . Esophagogastroduodenoscopy      Social History   Social History  . Marital Status: Widowed    Spouse Name: N/A  . Number of Children: 2  . Years of Education: N/A   Occupational History  . 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  now  works partime as Barrister's clerk.    Social History Main Topics  . Smoking status: Current Every Day Smoker -- 0.33 packs/day for 50 years    Types: Cigarettes    Last Attempt to Quit: 04/30/2014  . Smokeless tobacco: Never Used  . Alcohol Use: No  . Drug Use: No  . Sexual Activity: No   Other Topics Concern  . Not on file   Social History Narrative   widowed summer 2012, lives alone. Retired from work in [place] Reactions  . Ciprofloxacin Hives    Blisters on legs  . Codeine Nausea And Vomiting    Sweating, "passes out"  . Macrobid [Nitrofurantoin Monohyd Macro]     Stool incontinence  . Morphine And Related     "hospital bed was shaking"  . Prevnar [Pneumococcal 13-Val Conj Vacc] Palpitations    Pt c/o redness, swelling on arm after shot.  Pt c/o having respiratory problems and coughing ever since she got it.      Outpatient Prescriptions Prior to Visit  Medication Sig Dispense Refill  . diazepam (VALIUM) 5 MG tablet Take 0.5-1 tablets (2.5-5 mg total) by mouth every 12 (twelve) hours as needed for anxiety. 10 tablet 0  . omeprazole (PRILOSEC) 20 MG capsule Take 1 capsule (20 mg total) by mouth daily. 90 capsule 1  . albuterol (PROVENTIL) (2.5 MG/3ML) 0.083% nebulizer solution Take 3 mLs (2.5 mg total) by nebulization every 6 (six) hours as needed for wheezing or shortness of breath. (Patient not taking: Reported on 06/16/2015) 75 mL 2  . aspirin EC 81 MG tablet Take 81 mg by mouth daily. Reported on 06/25/2015 150 tablet 2  . colestipol (COLESTID) 5 G granules Take 5 g by mouth daily as needed (diarrhea). Reported on 06/25/2015    . fluticasone (FLONASE) 50 MCG/ACT nasal spray Place 1 spray into both nostrils daily. (Patient not taking: Reported on 06/25/2015) 16 g 2   No facility-administered medications prior to visit.   Meds ordered this encounter  Medications  . sulfamethoxazole-trimethoprim (BACTRIM DS,SEPTRA DS) 800-160 MG tablet    Sig: Take  1 tablet by mouth 2 (two) times daily.  . fexofenadine (ALLEGRA) 180 MG tablet    Sig: Take 180 mg by mouth daily.  . Magnesium Salicylate Tetrahyd (DOANS EXTRA STRENGTH) 580 MG TABS    Sig: Take by mouth.          Objective:   Physical Exam  Vitals:  Filed Vitals:   06/25/15 1507  BP: 128/78  Pulse: 95  Height: 5' 1.75" (1.568 m)  Weight: 156 lb 9.6 oz (71.033 kg)  SpO2: 93%    Constitutional/General:  Pleasant, well-nourished, well-developed, not  in any distress,  Comfortably seating.  Well kempt  Body mass index is 28.89 kg/(m^2). Wt Readings from Last 3 Encounters:  06/25/15 156 lb 9.6 oz (71.033 kg)  06/16/15 158 lb 8 oz (71.895 kg)  05/21/15 159 lb (72.122 kg)    Neck circumference:   HEENT: Pupils equal and reactive to light and accommodation. Anicteric sclerae. Normal nasal mucosa.   No oral  lesions,  mouth clear,  oropharynx clear, no postnasal drip. (-) Oral thrush. No dental caries.  Airway - Mallampati class III  Neck: No masses. Midline trachea. No JVD, (-) LAD. (-) bruits appreciated.  Respiratory/Chest: Grossly normal chest. (-) deformity. (-) Accessory muscle use.  Symmetric expansion. (-) Tenderness on palpation.  Resonant on percussion.  Diminished BS on both lower lung zones. (-) crackles, rhonchi. Some wheezing at bases (-) egophony  Cardiovascular: Regular rate and  rhythm, heart sounds normal, no murmur or gallops, no peripheral edema  Gastrointestinal:  Normal bowel sounds. Soft, non-tender. No hepatosplenomegaly.  (-) masses.   Musculoskeletal:  Normal muscle tone. Normal gait.   Extremities: Grossly normal. (-) clubbing, cyanosis.  (-) edema  Skin: (-) rash,lesions seen.   Neurological/Psychiatric : alert, oriented to time, place, person. Normal mood and affect            Assessment & Plan:  Lung mass Ct scan abdomen  (06/10/15) no abdominal pathology. Incidental cavitary mass over the left lower lobe, posterior,  approx 3 x 3 cm. Incidental finding. Pt with copd,likely moderate, still smokes.  Differentials : CA vs Infection (less likely). Doubt related to urinary issues. Plan: 1. Chest ct scan with contrast. D/w pt and daughters re: risk of IV contrast and they accept. Plan for chest ct scan on 3/30.  2. Cbc, cmp, coags, blood culture 3. If no other lesions seen in the chest ct scan, pt will need percutaneous lung Bx. They accept risk of procedure including ptx,bleeding, hypoxemia.   I personally reviewed previous images (Chest Xray, Chest Ct scan) done on this patient. I reviewed the reports on the images as well.  Showed family images as well.    COPD (chronic obstructive pulmonary disease) with emphysema PFT (11/2013)  Mild COPD 40PY still smokes 1/2 PPD. Gets sob with more than adls. Still can do dancing. Not to symtomatic per pt. Hold off on meds. UTD with vaccines. Will need alpha one on f/u.    Tobacco user 40PY, still smokes 1/2 PPD. Counseled on smoking cessation.     Thank you very much for letting me participate in this patient's care. Please do not hesitate to give me a call if you have any questions or concerns regarding the treatment plan.   Patient will follow up with me in 4-6 weeks. We will coordinate care with the pt and family prior to f/u.     Monica Becton, MD 06/26/2015   2:19 AM [facility] Pager: [phone] Office: [phone], Fax: [phone]

## 2015-06-25 NOTE — Telephone Encounter (Signed)
[name] [name], please advise where pt can be worked in at for 1 month follow up? thanks

## 2015-06-25 NOTE — Patient Instructions (Signed)
1. We will schedule you for blood work today. 2. We will schedule you for a chest CT scan with contrast. We'll call you with results of that. Give [place] a call after 2-3 days if you don't hear back.  Return to clinic in 1 month.

## 2015-06-25 NOTE — Telephone Encounter (Signed)
Pt will not be reachable by phone until after 5pm. Please call tomorrow with offer of appt 07/30/15 @ 9am. OK to db. Thanks!

## 2015-06-26 ENCOUNTER — Telehealth: Payer: Self-pay | Admitting: Pulmonary Disease

## 2015-06-26 ENCOUNTER — Ambulatory Visit (INDEPENDENT_AMBULATORY_CARE_PROVIDER_SITE_OTHER)
Admission: RE | Admit: 2015-06-26 | Discharge: 2015-06-26 | Disposition: A | Discharge status: Home / Self Care | Payer: Medicare Other | Source: Ambulatory Visit | Attending: Pulmonary Disease | Admitting: Pulmonary Disease

## 2015-06-26 DIAGNOSIS — R918 Other nonspecific abnormal finding of lung field: Secondary | ICD-10-CM

## 2015-06-26 MED ORDER — IOPAMIDOL (ISOVUE-300) INJECTION 61%
80.0000 mL | Freq: Once | INTRAVENOUS | Status: AC | PRN
  Administered 2015-06-26: 80 mL via INTRAVENOUS

## 2015-06-26 NOTE — Telephone Encounter (Signed)
Spoke with pt. She has been scheduled with AD on 07/30/15 at 12pm.

## 2015-06-26 NOTE — Telephone Encounter (Signed)
Called pt and daughter re: chest ct scan result. Pt with a 3 cm cavitary mass in RLL. Also with a 2 cm L adrenal mass (not seen on ct scan on 3/14).  Differentials : infectious vs CA.  At high risk for ptx. Pt will think about Bx. Mentioned about percutaneous Bx.   In the  Meantime, will order PET scan.  Sheena -- can we order PET scan for the pt pls? Thanks. Let me know when it will be scheduled.   Monica Becton, MD 06/26/2015, 3:55 PM [facility] Pager [phone] After 3 pm or if no answer, call [phone]

## 2015-06-26 NOTE — Telephone Encounter (Signed)
PET scan ordered. Will ask PCC's to advise me when scheduled.

## 2015-06-26 NOTE — Telephone Encounter (Signed)
No msg needed error.[name] [name] ° °

## 2015-06-26 NOTE — Telephone Encounter (Signed)
Result Note     Blood work so far are N.  --  I spoke with patient about results and she verbalized understanding and had no questions

## 2015-06-26 NOTE — Assessment & Plan Note (Signed)
40PY, still smokes 1/2 PPD. Counseled on smoking cessation.

## 2015-06-26 NOTE — Assessment & Plan Note (Signed)
PFT (11/2013)  Mild COPD 40PY still smokes 1/2 PPD. Gets sob with more than adls. Still can do dancing. Not to symtomatic per pt. Hold off on meds. UTD with vaccines. Will need alpha one on f/u.

## 2015-07-01 ENCOUNTER — Telehealth: Payer: Self-pay

## 2015-07-01 NOTE — Telephone Encounter (Signed)
FMLA forms are ready for pick up. LM for pt's daughter on home phone. Nothing further needed.

## 2015-07-02 LAB — CULTURE, BLOOD (SINGLE): Organism ID, Bacteria: NO GROWTH

## 2015-07-07 ENCOUNTER — Ambulatory Visit (HOSPITAL_COMMUNITY)
Admission: RE | Admit: 2015-07-07 | Discharge: 2015-07-07 | Disposition: A | Discharge status: Home / Self Care | Payer: Medicare Other | Source: Ambulatory Visit | Attending: Pulmonary Disease | Admitting: Pulmonary Disease

## 2015-07-07 DIAGNOSIS — E279 Disorder of adrenal gland, unspecified: Secondary | ICD-10-CM | POA: Insufficient documentation

## 2015-07-07 DIAGNOSIS — R918 Other nonspecific abnormal finding of lung field: Secondary | ICD-10-CM | POA: Insufficient documentation

## 2015-07-07 DIAGNOSIS — I2510 Atherosclerotic heart disease of native coronary artery without angina pectoris: Secondary | ICD-10-CM | POA: Insufficient documentation

## 2015-07-07 DIAGNOSIS — I7090 Unspecified atherosclerosis: Secondary | ICD-10-CM | POA: Insufficient documentation

## 2015-07-07 LAB — GLUCOSE, CAPILLARY: GLUCOSE-CAPILLARY: 110 mg/dL — AB (ref 65–99)

## 2015-07-07 MED ORDER — FLUDEOXYGLUCOSE F - 18 (FDG) INJECTION
7.7400 | Freq: Once | INTRAVENOUS | Status: AC | PRN
  Administered 2015-07-07: 7.74 via INTRAVENOUS

## 2015-07-10 ENCOUNTER — Telehealth: Payer: Self-pay | Admitting: Pulmonary Disease

## 2015-07-10 DIAGNOSIS — R918 Other nonspecific abnormal finding of lung field: Secondary | ICD-10-CM

## 2015-07-10 NOTE — Telephone Encounter (Signed)
Called and spoke with pt's daughter. She is requesting results from pt's PET. I explained to her that I would send a message to Dr. Corrie Dandy to address the results and we would return her call once we received his recs. She voiced understanding and had no further questions.  Dr. Tennis Must Dio's please advise

## 2015-07-11 NOTE — Telephone Encounter (Signed)
PET scan results just seen this am. I meant to call daughter / pt but was busy with the box.  I will try to call later this am.  Pls schedule pt for IR (interventional radiology) guided Bx of RLL mass. Pls call the pt or daughter first if they will agree.    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  I will call pt / daughter and mention to them regarding results and need for definitive dx.  There is increased chance of PTX since its a cavitary mass.  We will proceed with Bx as long as pt and daughter understand and accept the risk of PTX, bleeding, hypoxemia.   AD

## 2015-07-11 NOTE — Telephone Encounter (Signed)
Pls read  My message prior to this.  I spoke with pt and daughter and told them re: pet scan results.  pls schedule pt for IR guided bx of RLL mass as soon as possible.  Thanks  AD

## 2015-07-14 ENCOUNTER — Telehealth: Payer: Self-pay | Admitting: Pulmonary Disease

## 2015-07-14 NOTE — Telephone Encounter (Signed)
Pt will be contacted by IR radiology to set up this biopsy [name] [name]

## 2015-07-14 NOTE — Telephone Encounter (Signed)
Called and spoke to pt's daughter, Ginger. Ginger questioning when the CT biopsy will get scheduled, order was placed this morning 07/14/2015. Advised Ginger that the order placed as STAT.   PCC's please advise, does IR call pt to schedule or do we? Thanks.

## 2015-07-14 NOTE — Telephone Encounter (Signed)
Orders placed.

## 2015-07-16 ENCOUNTER — Other Ambulatory Visit: Payer: Self-pay | Admitting: General Surgery

## 2015-07-17 ENCOUNTER — Ambulatory Visit (HOSPITAL_COMMUNITY)
Admission: RE | Admit: 2015-07-17 | Discharge: 2015-07-17 | Disposition: A | Discharge status: Home / Self Care | Payer: Medicare Other | Source: Ambulatory Visit | Attending: Pulmonary Disease | Admitting: Pulmonary Disease

## 2015-07-17 ENCOUNTER — Encounter (HOSPITAL_COMMUNITY)
Admission: RE | Admit: 2015-07-17 | Discharge: 2015-07-17 | Disposition: A | Discharge status: Home / Self Care | Payer: Medicare Other | Source: Ambulatory Visit | Attending: Interventional Radiology | Admitting: Interventional Radiology

## 2015-07-17 ENCOUNTER — Encounter (HOSPITAL_COMMUNITY): Payer: Self-pay

## 2015-07-17 DIAGNOSIS — Z7982 Long term (current) use of aspirin: Secondary | ICD-10-CM | POA: Insufficient documentation

## 2015-07-17 DIAGNOSIS — Z8249 Family history of ischemic heart disease and other diseases of the circulatory system: Secondary | ICD-10-CM | POA: Insufficient documentation

## 2015-07-17 DIAGNOSIS — Z803 Family history of malignant neoplasm of breast: Secondary | ICD-10-CM | POA: Diagnosis not present

## 2015-07-17 DIAGNOSIS — I2510 Atherosclerotic heart disease of native coronary artery without angina pectoris: Secondary | ICD-10-CM | POA: Diagnosis not present

## 2015-07-17 DIAGNOSIS — R911 Solitary pulmonary nodule: Secondary | ICD-10-CM | POA: Insufficient documentation

## 2015-07-17 DIAGNOSIS — Z885 Allergy status to narcotic agent status: Secondary | ICD-10-CM | POA: Diagnosis not present

## 2015-07-17 DIAGNOSIS — K219 Gastro-esophageal reflux disease without esophagitis: Secondary | ICD-10-CM | POA: Insufficient documentation

## 2015-07-17 DIAGNOSIS — F419 Anxiety disorder, unspecified: Secondary | ICD-10-CM | POA: Diagnosis not present

## 2015-07-17 DIAGNOSIS — J449 Chronic obstructive pulmonary disease, unspecified: Secondary | ICD-10-CM | POA: Insufficient documentation

## 2015-07-17 DIAGNOSIS — F17210 Nicotine dependence, cigarettes, uncomplicated: Secondary | ICD-10-CM | POA: Insufficient documentation

## 2015-07-17 DIAGNOSIS — F329 Major depressive disorder, single episode, unspecified: Secondary | ICD-10-CM | POA: Insufficient documentation

## 2015-07-17 DIAGNOSIS — Z9889 Other specified postprocedural states: Secondary | ICD-10-CM

## 2015-07-17 DIAGNOSIS — I129 Hypertensive chronic kidney disease with stage 1 through stage 4 chronic kidney disease, or unspecified chronic kidney disease: Secondary | ICD-10-CM | POA: Insufficient documentation

## 2015-07-17 DIAGNOSIS — Z79899 Other long term (current) drug therapy: Secondary | ICD-10-CM | POA: Diagnosis not present

## 2015-07-17 DIAGNOSIS — E785 Hyperlipidemia, unspecified: Secondary | ICD-10-CM | POA: Diagnosis not present

## 2015-07-17 DIAGNOSIS — Z881 Allergy status to other antibiotic agents status: Secondary | ICD-10-CM | POA: Insufficient documentation

## 2015-07-17 DIAGNOSIS — Z833 Family history of diabetes mellitus: Secondary | ICD-10-CM | POA: Insufficient documentation

## 2015-07-17 DIAGNOSIS — N189 Chronic kidney disease, unspecified: Secondary | ICD-10-CM | POA: Diagnosis not present

## 2015-07-17 DIAGNOSIS — R918 Other nonspecific abnormal finding of lung field: Secondary | ICD-10-CM

## 2015-07-17 LAB — CBC
HEMATOCRIT: 41 % (ref 36.0–46.0)
Hemoglobin: 14 g/dL (ref 12.0–15.0)
MCH: 32.6 pg (ref 26.0–34.0)
MCHC: 34.1 g/dL (ref 30.0–36.0)
MCV: 95.3 fL (ref 78.0–100.0)
Platelets: 277 10*3/uL (ref 150–400)
RBC: 4.3 MIL/uL (ref 3.87–5.11)
RDW: 12.9 % (ref 11.5–15.5)
WBC: 10.1 10*3/uL (ref 4.0–10.5)

## 2015-07-17 LAB — PROTIME-INR
INR: 1.14 (ref 0.00–1.49)
Prothrombin Time: 14.8 seconds (ref 11.6–15.2)

## 2015-07-17 LAB — APTT: aPTT: 33 seconds (ref 24–37)

## 2015-07-17 MED ORDER — FENTANYL CITRATE (PF) 100 MCG/2ML IJ SOLN
INTRAMUSCULAR | Status: AC
  Filled 2015-07-17: qty 4

## 2015-07-17 MED ORDER — MIDAZOLAM HCL 2 MG/2ML IJ SOLN
INTRAMUSCULAR | Status: AC
  Filled 2015-07-17: qty 4

## 2015-07-17 MED ORDER — SODIUM CHLORIDE 0.9 % IV SOLN
INTRAVENOUS | Status: DC
  Administered 2015-07-17: 10:00:00 via INTRAVENOUS

## 2015-07-17 MED ORDER — FENTANYL CITRATE (PF) 100 MCG/2ML IJ SOLN
INTRAMUSCULAR | Status: AC | PRN
  Administered 2015-07-17: 50 ug via INTRAVENOUS

## 2015-07-17 MED ORDER — MIDAZOLAM HCL 2 MG/2ML IJ SOLN
INTRAMUSCULAR | Status: AC | PRN
  Administered 2015-07-17: 1 mg via INTRAVENOUS

## 2015-07-17 MED ORDER — LIDOCAINE HCL 1 % IJ SOLN
INTRAMUSCULAR | Status: AC
  Filled 2015-07-17: qty 20

## 2015-07-17 NOTE — Discharge Instructions (Signed)
Needle Biopsy of Lung, Care After °Refer to this sheet in the next few weeks. These instructions provide you with information on caring for yourself after your procedure. Your health care provider may also give you more specific instructions. Your treatment has been planned according to current medical practices, but problems sometimes occur. Call your health care provider if you have any problems or questions after your procedure. °WHAT TO EXPECT AFTER THE PROCEDURE °· A bandage will be applied over the area where the needle was inserted. You may be asked to apply pressure to the bandage for several minutes to ensure there is minimal bleeding. °· In most cases, you can leave when your needle biopsy procedure is completed. Do not drive yourself home. Someone else should take you home. °· If you received an IV sedative or general anesthetic, you will be taken to a comfortable place to relax while the medicine wears off. °· If you have upcoming travel scheduled, talk to your health care provider about when it is safe to travel by air after the procedure. °HOME CARE INSTRUCTIONS °· Expect to take it easy for the rest of the day. °· Protect the area where you received the needle biopsy by keeping the bandage in place for as long as instructed. °· You may feel some mild pain or discomfort in the area, but this should stop in a day or two. °· Take medicines only as directed by your health care provider. °SEEK MEDICAL CARE IF:  °· You have pain at the biopsy site that worsens or is not helped by medicine. °· You have swelling or drainage at the needle biopsy site. °· You have a fever. °SEEK IMMEDIATE MEDICAL CARE IF:  °· You have new or worsening shortness of breath. °· You have chest pain. °· You are coughing up blood. °· You have bleeding that does not stop with pressure or a bandage. °· You develop light-headedness or fainting. °  °This information is not intended to replace advice given to you by your health care  provider. Make sure you discuss any questions you have with your health care provider. °  °Document Released: 01/10/2007 Document Revised: 04/05/2014 Document Reviewed: 08/07/2012 °Elsevier Interactive Patient Education ©2016 [employer] °Lung Biopsy °A lung biopsy is a procedure in which a tissue sample is removed from the lung. The tissue can be examined under a microscope to help diagnose various lung disorders.  °LET YOUR HEALTH CARE PROVIDER KNOW ABOUT: °· Any allergies you have. °· All medicines you are taking, including vitamins, herbs, eye drops, creams, and over-the-counter medicines. °· Previous problems you or members of your family have had with the use of anesthetics. °· Any blood disorders or bleeding problems that you have. °· Previous surgeries you have had. °· Medical conditions you have. °RISKS AND COMPLICATIONS °Generally, a lung biopsy is a safe procedure. However, problems can occur and include: °· Collapse of the lung.   °· Bleeding.   °· Infection.   °BEFORE THE PROCEDURE °· Do not eat or drink anything after midnight on the night before the procedure or as directed by your health care provider. °· Ask your health care provider about changing or stopping your regular medicines. This is especially important if you are taking diabetes medicines or blood thinners. °· Plan to have someone take you home after the procedure. °PROCEDURE °Various methods can be used to perform a lung biopsy:  °· Needle biopsy. A biopsy needle is inserted into the lung. The needle is used to collect   the tissue sample. A CT scanner may be used to guide the needle to the right place in the lung. For this method, a medicine is used to numb the area where the biopsy sample will be taken (local anesthetic). °· Bronchoscopy. A flexible tube (bronchoscope) is inserted into your lungs by going through your mouth or nose. A needle or forceps is passed through the bronchoscope to remove the tissue sample. For this method,  medicine may be used to numb the back of your throat. °· Open biopsy. A cut (incision) is made in your chest. The tissue sample is then removed using surgical tools. The incision is closed with skin glue, skin adhesive strips, or stitches. For this method, you will be given medicine to make you sleep through the procedure (general anesthetic). °AFTER THE PROCEDURE °· Your recovery will be assessed and monitored. °· You might have soreness and tenderness at the site of the biopsy for a few days after the procedure. °· You might have a cough and some soreness in your throat for a few days if a bronchoscope was used. °  °This information is not intended to replace advice given to you by your health care provider. Make sure you discuss any questions you have with your health care provider. °  °Document Released: 06/03/2004 Document Revised: 04/05/2014 Document Reviewed: 08/27/2012 °Elsevier Interactive Patient Education ©2016 [employer] ° °

## 2015-07-17 NOTE — Procedures (Signed)
Interventional Radiology Procedure Note  Procedure: CT guided RLL lung biopsy.  4 x 18G core.  Complications: None Recommendations:  - 1 hour CXR - NPO - Recover supine - DC in 2 hours when goals met   Signed,  Dulcy Fanny. Earleen Newport, DO

## 2015-07-17 NOTE — H&P (Signed)
Chief Complaint: RLL cavitary lesion  Referring Physician:Dr. Lewiston  Supervising Physician: Corrie Mckusick  HPI: [name] [name] is an 70 y.o. female who was recently noted to have a lesion in her left lung when she underwent a CT at [facility] for some kidney UTI issues.  She was referred to pulmonary and a dedicated CT of the chest was performed and then a PET scan.  This area in the lung lite up along with some surrounding areas concerning for metastatic disease.  A request has been made for a biopsy to confirm this diagnosis.  She has been doing fairly well recently.  She thinks she pulled a muscle on the left side of her abdomen, but this is improving.  Otherwise, no new complaints.  Past Medical History:  Past Medical History  Diagnosis Date  . Hereditary hemochromatosis ([place]) 2004  . ASTHMA   . HYPERTENSION   . GERD   . Chronic kidney disease   . Arthritis     left shoulder   . Headache(784.0)     sinus headaches   . H/O hiatal hernia   . Anxiety   . Hyperlipidemia   . CAD (coronary artery disease)   . Depression   . Hypertension   . Urinary tract infection   . COPD (chronic obstructive pulmonary disease) with emphysema ([place])     mild dz on PFTs 11/2013  . 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 Hx of adenomatous polyp of colon 07/25/2014  . Osteopenia 10/07/2014    DEXA @ [facility] 09/2014: -1.9 L fem  . Other mixed anxiety disorders 06/16/2015    Past Surgical History:  Past Surgical History  Procedure Laterality Date  . Cholecystectomy  03/29/09  . Appendectomy  03/29/81  . Abdominal hysterectomy  03/29/81  . Bladder tack  03/29/2002  . Other surgical history      cyst removed from intestines to ovary    . Other surgical history      tendon surgery left wrist   . Cystoscopy  01/24/14    NEGATIVE;Dr Ottelin  . Colonoscopy w/ biopsies    . Esophagogastroduodenoscopy       Family History:  Family History  Problem Relation Age of Onset  . Hypertension Mother   . Heart disease Mother   . Breast cancer Mother 27  . Diabetes Father   . Heart disease Father   . Alcohol abuse Other   . Arthritis Other   . Cirrhosis Brother   . Cirrhosis Brother   . Cirrhosis Brother   . Lung cancer Brother   . Colon cancer Neg Hx   . Colon polyps Neg Hx   . Esophageal cancer Neg Hx   . Rectal cancer Neg Hx   . Stomach cancer Neg Hx     Social History:  reports that she has been smoking Cigarettes.  She has a 16.5 pack-year smoking history. She has never used smokeless tobacco. She reports that she does not drink alcohol or use illicit drugs.  Allergies:  Allergies  Allergen Reactions  . Other Other (See Comments)    Please if giving pt anesthesia - she would like to have a scopoline patch to prevent nausea   . Ciprofloxacin Hives    Blisters on legs  . Codeine Nausea And Vomiting    Sweating, "passes out"  . Macrobid [Nitrofurantoin Monohyd Macro]     Stool incontinence  .  Morphine And Related     "hospital bed was shaking"  . Prevnar [Pneumococcal 13-Val Conj Vacc] Palpitations    Pt c/o redness, swelling on arm after shot.  Pt c/o having respiratory problems and coughing ever since she got it.     Medications:   Medication List    ASK your doctor about these medications        acetaminophen 500 MG tablet  Commonly known as:  TYLENOL  Take 500-1,000 mg by mouth every 8 (eight) hours as needed for mild pain or moderate pain.     albuterol (2.5 MG/3ML) 0.083% nebulizer solution  Commonly known as:  PROVENTIL  Take 3 mLs (2.5 mg total) by nebulization every 6 (six) hours as needed for wheezing or shortness of breath.     aspirin EC 81 MG tablet  Take 81 mg by mouth daily. Reported on 06/25/2015     budesonide-formoterol 160-4.5 MCG/ACT inhaler  Commonly known as:  SYMBICORT  Inhale 2 puffs into the lungs 2 (two) times daily as needed.      diazepam 5 MG tablet  Commonly known as:  VALIUM  Take 0.5-1 tablets (2.5-5 mg total) by mouth every 12 (twelve) hours as needed for anxiety.     fexofenadine 180 MG tablet  Commonly known as:  ALLEGRA  Take 180 mg by mouth daily.     omeprazole 20 MG capsule  Commonly known as:  PRILOSEC  Take 1 capsule (20 mg total) by mouth daily.        Please HPI for pertinent positives, otherwise complete 10 system ROS negative.  Mallampati Score: MD Evaluation Airway: WNL Heart: WNL Abdomen: WNL Chest/ Lungs: WNL ASA  Classification: 3 Mallampati/Airway Score: One  Physical Exam: BP 156/72 mmHg  Pulse 79  Temp(Src) 98.6 F (37 C) (Oral)  Resp 15  Ht 5\' 3"  (1.6 m)  Wt 151 lb (68.493 kg)  BMI 26.76 kg/m2  SpO2 98% Body mass index is 26.76 kg/(m^2). General: pleasant, WD, WN white female who is laying in bed in NAD HEENT: head is normocephalic, atraumatic.  Sclera are noninjected.  PERRL.  Ears and nose without any masses or lesions.  Mouth is pink and moist Heart: regular, rate, and rhythm.  Normal s1,s2. No obvious murmurs, gallops, or rubs noted.  Palpable radial and pedal pulses bilaterally Lungs: CTAB, no wheezes, rhonchi, or rales noted.  Respiratory effort nonlabored Abd: soft, NT, ND, +BS, no masses, hernias, or organomegaly MS: all 4 extremities are symmetrical with no cyanosis, clubbing, or edema. Psych: A&Ox3 with an appropriate affect.   Labs: Results for orders placed or performed during the hospital encounter of 07/17/15 (from the past 48 hour(s))  APTT upon arrival     Status: None   Collection Time: 07/17/15  9:59 AM  Result Value Ref Range   aPTT 33 24 - 37 seconds  CBC upon arrival     Status: None   Collection Time: 07/17/15  9:59 AM  Result Value Ref Range   WBC 10.1 4.0 - 10.5 K/uL   RBC 4.30 3.87 - 5.11 MIL/uL   Hemoglobin 14.0 12.0 - 15.0 g/dL   HCT 41.0 36.0 - 46.0 %   MCV 95.3 78.0 - 100.0 fL   MCH 32.6 26.0 - 34.0 pg   MCHC 34.1 30.0 - 36.0  g/dL   RDW 12.9 11.5 - 15.5 %   Platelets 277 150 - 400 K/uL  Protime-INR upon arrival     Status: None   Collection  Time: 07/17/15  9:59 AM  Result Value Ref Range   Prothrombin Time 14.8 11.6 - 15.2 seconds   INR 1.14 0.00 - 1.49    Imaging: No results found.  Assessment/Plan 1. RLL cavitary lung lesion -we will plan for lung biopsy today with possible chest tube placement if needed. -her labs and vitals signs have all been reviewed -Risks and Benefits discussed with the patient including, but not limited to bleeding, hemoptysis, respiratory failure requiring intubation, infection, pneumothorax requiring chest tube placement, stroke from air embolism or even death. All of the patient's questions were answered, patient is agreeable to proceed. Consent signed and in chart.   Thank you for this interesting consult.  I greatly enjoyed meeting Nathaniel Boodoo and look forward to participating in their care.  A copy of this report was sent to the requesting provider on this date.  Electronically Signed: Henreitta Cea 07/17/2015, 10:48 AM   I spent a total of  30 Minutes   in face to face in clinical consultation, greater than 50% of which was counseling/coordinating care for RLL lung lesion

## 2015-07-22 ENCOUNTER — Telehealth: Payer: Self-pay | Admitting: Pulmonary Disease

## 2015-07-22 DIAGNOSIS — J850 Gangrene and necrosis of lung: Secondary | ICD-10-CM

## 2015-07-22 DIAGNOSIS — I776 Arteritis, unspecified: Secondary | ICD-10-CM

## 2015-07-22 NOTE — Telephone Encounter (Signed)
   Sheena --  I spoke to the pt and told her Bx of mass was (-) for CA. Some vasculitis is considered.  She will need the following labs done :  BMP, LFTs,  ESR, CRP ANA, anti DS DNA Ab RF,  Anti CCP Ab CK total Anti Jo Ab ANCA (C-ANCA, P-ANCA) Anti Scl Ab HIV test Urinalysis.    Dx is Vasculitis or Necrotizing pneumonia.   Thanks.  She plans to pass by tomorrow.   AD

## 2015-07-22 NOTE — Telephone Encounter (Signed)
Routed to AD.

## 2015-07-22 NOTE — Telephone Encounter (Signed)
Patient daughter called back returning our call- She can be reached at [phone]

## 2015-07-22 NOTE — Telephone Encounter (Signed)
Spoke with pts daughter. Updated her re: pts condition.   AD

## 2015-07-22 NOTE — Telephone Encounter (Signed)
Dr Corrie Dandy is calling patient himself Routing to AD so he can document

## 2015-07-22 NOTE — Telephone Encounter (Signed)
LMTCB.   I tried getting in touch with daughter re: Bx results. Left message in VM. bx was (-) for CA. Need to determine other cause. Pt needs Bloodwork.

## 2015-07-23 ENCOUNTER — Other Ambulatory Visit (INDEPENDENT_AMBULATORY_CARE_PROVIDER_SITE_OTHER): Payer: Medicare Other

## 2015-07-23 DIAGNOSIS — I776 Arteritis, unspecified: Secondary | ICD-10-CM

## 2015-07-23 DIAGNOSIS — J850 Gangrene and necrosis of lung: Secondary | ICD-10-CM

## 2015-07-23 LAB — URINALYSIS, ROUTINE W REFLEX MICROSCOPIC
BILIRUBIN URINE: NEGATIVE
Ketones, ur: NEGATIVE
Nitrite: NEGATIVE
PH: 6 (ref 5.0–8.0)
Specific Gravity, Urine: <=1.005 — AB (ref 1.000–1.030)
Total Protein, Urine: NEGATIVE
Urine Glucose: NEGATIVE
Urobilinogen, UA: 0.2 (ref 0.0–1.0)

## 2015-07-23 LAB — BASIC METABOLIC PANEL
BUN: 12 mg/dL (ref 6–23)
CALCIUM: 10.1 mg/dL (ref 8.4–10.5)
CO2: 27 mEq/L (ref 19–32)
CREATININE: 0.89 mg/dL (ref 0.40–1.20)
Chloride: 104 mEq/L (ref 96–112)
GFR: 66.71 mL/min (ref >60.00)
Glucose, Bld: 110 mg/dL — ABNORMAL HIGH (ref 70–99)
Potassium: 4.1 mEq/L (ref 3.5–5.1)
Sodium: 140 mEq/L (ref 135–145)

## 2015-07-23 LAB — HEPATIC FUNCTION PANEL
ALBUMIN: 4.4 g/dL (ref 3.5–5.2)
ALT: 13 U/L (ref 0–35)
AST: 12 U/L (ref 0–37)
Alkaline Phosphatase: 103 U/L (ref 39–117)
BILIRUBIN TOTAL: 0.4 mg/dL (ref 0.2–1.2)
Bilirubin, Direct: 0.1 mg/dL (ref 0.0–0.3)
Total Protein: 7.5 g/dL (ref 6.0–8.3)

## 2015-07-23 LAB — C-REACTIVE PROTEIN: CRP: 0.2 mg/dL — AB (ref 0.5–20.0)

## 2015-07-23 LAB — RHEUMATOID FACTOR

## 2015-07-23 LAB — SEDIMENTATION RATE: SED RATE: 11 mm/h (ref 0–22)

## 2015-07-24 LAB — CK TOTAL AND CKMB (NOT AT ARMC)
CK, MB: <0.7 ng/mL (ref 0.0–5.0)
Total CK: 36 U/L (ref 7–177)

## 2015-07-24 LAB — ANA: ANA: NEGATIVE

## 2015-07-24 LAB — ANTI-SCLERODERMA ANTIBODY: Scleroderma (Scl-70) (ENA) Antibody, IgG: <1

## 2015-07-24 LAB — ANTI-DNA ANTIBODY, DOUBLE-STRANDED: DS DNA AB: 3 [IU]/mL

## 2015-07-24 LAB — RFLX P-ANCA TITER

## 2015-07-24 LAB — CYCLIC CITRUL PEPTIDE ANTIBODY, IGG

## 2015-07-24 LAB — ANCA SCREEN W REFLEX TITER: ANCA SCREEN: POSITIVE — AB

## 2015-07-24 LAB — JO-1 ANTIBODY-IGG: JO-1 ANTIBODY, IGG: NEGATIVE

## 2015-07-30 ENCOUNTER — Encounter: Payer: Self-pay | Admitting: Pulmonary Disease

## 2015-07-30 ENCOUNTER — Ambulatory Visit (INDEPENDENT_AMBULATORY_CARE_PROVIDER_SITE_OTHER): Payer: Medicare Other | Admitting: Pulmonary Disease

## 2015-07-30 VITALS — BP 132/88 | HR 95 | Ht 63.0 in | Wt 153.0 lb

## 2015-07-30 DIAGNOSIS — M3130 Wegener's granulomatosis without renal involvement: Secondary | ICD-10-CM | POA: Diagnosis not present

## 2015-07-30 DIAGNOSIS — R918 Other nonspecific abnormal finding of lung field: Secondary | ICD-10-CM

## 2015-07-30 DIAGNOSIS — M3131 Wegener's granulomatosis with renal involvement: Secondary | ICD-10-CM

## 2015-07-30 DIAGNOSIS — J439 Emphysema, unspecified: Secondary | ICD-10-CM | POA: Diagnosis not present

## 2015-07-30 DIAGNOSIS — Z720 Tobacco use: Secondary | ICD-10-CM

## 2015-07-30 DIAGNOSIS — K219 Gastro-esophageal reflux disease without esophagitis: Secondary | ICD-10-CM

## 2015-07-30 MED ORDER — FLUTICASONE FUROATE-VILANTEROL 100-25 MCG/INH IN AEPB: 1.0000 | INHALATION_SPRAY | Freq: Every day | RESPIRATORY_TRACT | Status: DC

## 2015-07-30 MED ORDER — PREDNISONE 10 MG PO TABS: 30.0000 mg | ORAL_TABLET | Freq: Every day | ORAL | Status: DC

## 2015-07-30 MED ORDER — METHYLPREDNISOLONE ACETATE 80 MG/ML IJ SUSP
80.0000 mg | Freq: Once | INTRAMUSCULAR | Status: AC
  Administered 2015-07-30: 80 mg via INTRAMUSCULAR

## 2015-07-30 MED ORDER — SUCRALFATE 1 G PO TABS: 1.0000 g | ORAL_TABLET | Freq: Four times a day (QID) | ORAL | Status: DC

## 2015-07-30 MED ORDER — BUDESONIDE-FORMOTEROL FUMARATE 160-4.5 MCG/ACT IN AERO
2.0000 | INHALATION_SPRAY | Freq: Two times a day (BID) | RESPIRATORY_TRACT | Status: DC
Start: 2015-07-30 — End: 2015-10-13

## 2015-07-30 NOTE — Assessment & Plan Note (Signed)
Smoking cessation  

## 2015-07-30 NOTE — Assessment & Plan Note (Signed)
Pt with GERD, worse with prednisone. Cont omeprazole. Start sucralfate 1 g QID

## 2015-07-30 NOTE — Progress Notes (Signed)
Subjective:    Patient ID: [name] [name], female    DOB: 07/22/1945, 70 y.o.   MRN: [id number]  HPI  This is the case of [name] [name], 70 y.o. Female, who was referred by Dr. Vikki Ports in consultation regarding abnormal ct scan. Pt had a Right lower lobe cavitary lesion.   As you very well know, patient 29 PY, still smokes 1/2 PPD. Denies SOB. Has mild COPD.   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   The last month, patient has been having urinary symptoms. Has painful urination, bloody urine. She saw her primary care doctor and was prescribed antibiotics. Cultures remained negative. She ended up seeing a urologist. Urologist did a CT scan of the abdomen. Scan of the abdomen was negative as far as the abdomen was concerned but incidentally found a cavitary lung mass, left lower lobe, posteriorly located, 3 x 3 cm. Because of the abnormal CT scan, she was referred to [place].  She still has urinary issues. Currently on antibiotics.  ROV (07/30/2015) PET scan showed avid RLL lesion as well as some spots in the R lung and ribs on the left. Pt had a Bx of RLL which was (-) for CA but showed vasculitis.  CTD w/u was (+) for P ANCA. Was (-) for ANA, CCP, dsDNA, antiJo, RA, anti Scl 70.   SOB better with symbicort. On and off cough. On and off hematuria. Has chronic lower back pain. Takes tylenol -- not working well.    Review of Systems  Constitutional: Negative.  Negative for fever and unexpected weight change.  HENT: Positive for congestion and postnasal drip. Negative for dental problem, ear pain, nosebleeds, rhinorrhea, sinus pressure, sneezing, sore throat and trouble swallowing.   Eyes: Positive for itching. Negative for redness.  Respiratory: Positive for cough, shortness of breath and wheezing. Negative for chest tightness.   Cardiovascular: Negative.  Negative for palpitations and leg swelling.  Gastrointestinal: Negative.  Negative for nausea  and vomiting.  Endocrine: Negative.   Genitourinary: Negative.  Negative for dysuria.       Bloody urine. Painful urination  Musculoskeletal: Positive for arthralgias. Negative for joint swelling.  Skin: Negative.  Negative for rash.  Allergic/Immunologic: Positive for environmental allergies.  Neurological: Negative.  Negative for headaches.  Hematological: Bruises/bleeds easily.  Psychiatric/Behavioral: Negative.  Negative for dysphoric mood. The patient is not nervous/anxious.      Past Medical History  Diagnosis Date  . Hereditary hemochromatosis ([place]) 2004  . ASTHMA   . HYPERTENSION   . GERD   . Chronic kidney disease   . Arthritis     left shoulder   . Headache(784.0)     sinus headaches   . H/O hiatal hernia   . Anxiety   . Hyperlipidemia   . CAD (coronary artery disease)   . Depression   . Hypertension   . Urinary tract infection   . COPD (chronic obstructive pulmonary disease) with emphysema ([place])     mild dz on PFTs 11/2013  . 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 Hx of adenomatous polyp of colon 07/25/2014  . Osteopenia 10/07/2014    DEXA @ [facility] 09/2014: -1.9 L fem  . 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 Hypertension Mother   . Heart disease Mother   .  Breast cancer Mother 7  . Diabetes Father   . Heart disease Father   . Alcohol abuse Other   . Arthritis Other   . Cirrhosis Brother   . Cirrhosis Brother   . Cirrhosis Brother   . Lung cancer Brother   . Colon cancer Neg Hx   . Colon polyps Neg Hx   . Esophageal cancer Neg Hx   . Rectal cancer Neg Hx   . Stomach cancer Neg Hx      Past Surgical History  Procedure Laterality Date  . Cholecystectomy  03/29/09  . Appendectomy  03/29/81  . Abdominal hysterectomy  03/29/81  . Bladder tack  03/29/2002  . Other surgical history      cyst removed  from intestines to ovary    . Other surgical history      tendon surgery left wrist   . Cystoscopy  01/24/14    NEGATIVE;Dr Ottelin  . Colonoscopy w/ biopsies    . Esophagogastroduodenoscopy      Social History   Social History  . Marital Status: Widowed    Spouse Name: N/A  . Number of Children: 2  . Years of Education: N/A   Occupational History  . 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    Social History Main Topics  . Smoking status: Current Every Day Smoker -- 0.33 packs/day for 50 years    Types: Cigarettes    Last Attempt to Quit: 04/30/2014  . Smokeless tobacco: Never Used  . Alcohol Use: No  . Drug Use: No  . Sexual Activity: No   Other Topics Concern  . Not on file   Social History Narrative   widowed summer 2012, lives alone. Retired from work in [place] Reactions  . Other Other (See Comments)    Please if giving pt anesthesia - she would like to have a scopoline patch to prevent nausea   . Ciprofloxacin Hives    Blisters on legs  . Codeine Nausea And Vomiting    Sweating, "passes out"  . Macrobid [Nitrofurantoin Monohyd Macro]     Stool incontinence  . Morphine And Related     "hospital bed was shaking"  . Prevnar [Pneumococcal 13-Val Conj Vacc] Palpitations    Pt c/o redness, swelling on arm after shot.  Pt c/o having respiratory problems and coughing ever since she got it.      Outpatient Prescriptions Prior to Visit  Medication Sig Dispense Refill  . acetaminophen (TYLENOL) 500 MG tablet Take 500-1,000 mg by mouth every 8 (eight) hours as needed for mild pain or moderate pain.    Marland Kitchen albuterol (PROVENTIL) (2.5 MG/3ML) 0.083% nebulizer solution Take 3 mLs (2.5 mg total) by nebulization every 6 (six) hours as needed for wheezing or shortness of breath. 75 mL 2  . aspirin EC 81 MG tablet Take 81 mg by mouth daily. Reported on 06/25/2015 150 tablet 2  . budesonide-formoterol (SYMBICORT)  160-4.5 MCG/ACT inhaler Inhale 2 puffs into the lungs 2 (two) times daily as needed.    . diazepam (VALIUM) 5 MG tablet Take 0.5-1 tablets (2.5-5 mg total) by mouth every 12 (twelve) hours as needed for anxiety. 10 tablet 0  . fexofenadine (ALLEGRA) 180 MG tablet Take 180 mg by mouth daily.    Marland Kitchen omeprazole (PRILOSEC) 20 MG capsule Take 1 capsule (20 mg total) by mouth daily. 90 capsule 1   No facility-administered  medications prior to visit.   Meds ordered this encounter  Medications  . predniSONE (DELTASONE) 10 MG tablet    Sig: Take 3 tablets (30 mg total) by mouth daily. For 2 weeks    Dispense:  42 tablet    Refill:  0  . sucralfate (CARAFATE) 1 g tablet    Sig: Take 1 tablet (1 g total) by mouth 4 (four) times daily.    Dispense:  120 tablet    Refill:  0  . methylPREDNISolone acetate (DEPO-MEDROL) injection 80 mg    Sig:   . budesonide-formoterol (SYMBICORT) 160-4.5 MCG/ACT inhaler    Sig: Inhale 2 puffs into the lungs 2 (two) times daily.    Dispense:  1 Inhaler    Refill:  0    Order Specific Question:  Lot Number?    Answer:  [id number]    Order Specific Question:  Expiration Date?    Answer:  06/27/2016    Order Specific Question:  Manufacturer?    Answer:  [employer] [[id number]]    Order Specific Question:  Quantity    Answer:  1     Comments:  inhaler  . fluticasone furoate-vilanterol (BREO ELLIPTA) 100-25 MCG/INH AEPB    Sig: Inhale 1 puff into the lungs daily.    Dispense:  1 each    Refill:  6          Objective:   Physical Exam  Vitals:  Filed Vitals:   07/30/15 1153  BP: 132/88  Pulse: 95  Height: 5\' 3"  (1.6 m)  Weight: 153 lb (69.4 kg)  SpO2: 94%    Constitutional/General:  Pleasant, well-nourished, well-developed, not in any distress,  Comfortably seating.  Well kempt  Body mass index is 27.11 kg/(m^2). Wt Readings from Last 3 Encounters:  07/30/15 153 lb (69.4 kg)  07/17/15 151 lb (68.493 kg)  06/25/15 156 lb 9.6 oz (71.033 kg)    HEENT:  Pupils equal and reactive to light and accommodation. Anicteric sclerae. Normal nasal mucosa.   No oral  lesions,  mouth clear,  oropharynx clear, no postnasal drip. (-) Oral thrush. No dental caries.  Airway - Mallampati class III  Neck: No masses. Midline trachea. No JVD, (-) LAD. (-) bruits appreciated.  Respiratory/Chest: Grossly normal chest. (-) deformity. (-) Accessory muscle use.  Symmetric expansion. (-) Tenderness on palpation.  Resonant on percussion.  Diminished BS on both lower lung zones. (-) crackles, rhonchi. Some wheezing at bases (-) egophony  Cardiovascular: Regular rate and  rhythm, heart sounds normal, no murmur or gallops, no peripheral edema  Gastrointestinal:  Normal bowel sounds. Soft, non-tender. No hepatosplenomegaly.  (-) masses.   Musculoskeletal:  Normal muscle tone. Normal gait.   Extremities: Grossly normal. (-) clubbing, cyanosis.  (-) edema  Skin: (-) rash,lesions seen.   Neurological/Psychiatric : alert, oriented to time, place, person. Normal mood and affect            Assessment & Plan:  Lung mass CT scan abdomen  (06/10/15) no abdominal pathology. Incidental cavitary mass over the left lower lobe, posterior, approx 3 x 3 cm.  CT scan of chest (06/26/15) 1. 3 cm pleural-based cavitary mass in the right lower lobe. This could be infectious or inflammatory in etiology or neoplastic. No other evidence of neoplastic disease within the chest. Left adrenal mass which may reflect an adenoma. Metastatic disease is possible.   PET scan :Hypermetabolism corresponding to a cavitary right lower lobe pulmonary nodule, consistent with primary bronchogenic carcinoma. Multiple hypermetabolic right  pleural-based foci are consistent with pleural metastasis. Left posterior medial seventh rib hypermetabolism and probable lucency, suspicious for isolated osseous metastasis.  Left adrenal enlargement and mild hypermetabolism are Indeterminate  Pt had IR  guided Bx of RLL mass on 07/17/2015 : NO evidence of CA. Has vasculitis, fibrosis, focal necrosis. AFB and funfal stains were (-).  I discussed the case with  Pathologist Dr. Willeen Niece. He agrees about possiblity of vasculitis/WG.   Plan: 1. Treat as WG. See separate discussion. 2. Need surveillance ct scan to make sure (-) CA. Plan for chest ct scan in July 2017 -- 3 mos from PET scan.   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   CT scan abdomen  (06/10/15) no abdominal pathology. Incidental cavitary mass over the left lower lobe, posterior, approx 3 x 3 cm.  CT scan of chest (06/26/15) 1. 3 cm pleural-based cavitary mass in the right lower lobe. This could be infectious or inflammatory in etiology or neoplastic. No other evidence of neoplastic disease within the chest. Left adrenal mass which may reflect an adenoma. Metastatic disease is possible.   PET scan :Hypermetabolism corresponding to a cavitary right lower lobe pulmonary nodule, consistent with primary bronchogenic carcinoma. Multiple hypermetabolic right pleural-based foci are consistent with pleural metastasis. Left posterior medial seventh rib hypermetabolism and probable lucency, suspicious for isolated osseous metastasis.  Left adrenal enlargement and mild hypermetabolism are Indeterminate  Pt had IR guided Bx of RLL mass on 07/17/2015 : NO evidence of CA. Has vasculitis, fibrosis, focal necrosis. AFB and funfal stains were (-).  I discussed the case with  Pathologist Dr. Willeen Niece. He agrees about possiblity of vasculitis/WG.  CTD w/u was (+) for P ANCA.  Was (-) for everything else.  Pt is in moderated back pain > not sure if related to vasculitis. Pt also with urinary issues x 2 yrs. On and off hematuria. Recent U/A had blood.   Likely with WG. Plan : 1. We will refer patient to [facility] rheumatology urgent as patient needs immunosuppressant for her  ongoing issues. In the meantime, we will give her Depo-Medrol shot right now. We will also start her on prednisone, 30 mg a day for the next 2 weeks. My assumption is she is seen by rheum within the next 2 weeks and they'll decide what medicine to give her. Patient mentions that her reflux acts up whenever she takes prednisone. We will start her on sucralfate 1 g 4 times a day on top of her omeprazole. 2. I extensively discussed this diagnosis with her and her 2 other children. We gave her information printout sheets regarding Wegner's.    COPD (chronic obstructive pulmonary disease) with emphysema PFT (11/2013)  Mild COPD 40PY still smokes 1/2 PPD. Gets sob with more than adls. Still can do dancing. Not to symtomatic per pt. She felt better with Symbicort but was not covered by insurance. Plan : 1. We gave her Symbicort samples, 160/4.5, 2 puffs twice a day. 2. We will prescribe her Memory Dance, once a day. Not sure if her insurance will cover it. If not, told her to give [place] a call. 3. Needs alpha one on f/u.    Tobacco user Smoking cessation.   GERD Pt with GERD, worse with prednisone. Cont omeprazole. Start sucralfate 1 g QID    Follow up in August.   I spent at least 40 minutes with the patient today and more than 50% was spent counseling the  patient regarding disease and management and facilitating labs and medications.  We extensively discussed with the patient the dx of Wegeners granulomatosis.    Monica Becton, MD 07/30/2015   1:00 PM Pulmonary and [place] Pager: [phone] Office: [phone], Fax: [phone]

## 2015-07-30 NOTE — Assessment & Plan Note (Signed)
PFT (11/2013)  Mild COPD 40PY still smokes 1/2 PPD. Gets sob with more than adls. Still can do dancing. Not to symtomatic per pt. She felt better with Symbicort but was not covered by insurance. Plan : 1. We gave her Symbicort samples, 160/4.5, 2 puffs twice a day. 2. We will prescribe her Memory Dance, once a day. Not sure if her insurance will cover it. If not, told her to give [place] a call. 3. Needs alpha one on f/u.

## 2015-07-30 NOTE — Assessment & Plan Note (Signed)
Pt with incidental RLL cavitary lung mass.   CT scan abdomen  (06/10/15) no abdominal pathology. Incidental cavitary mass over the left lower lobe, posterior, approx 3 x 3 cm.  CT scan of chest (06/26/15) 1. 3 cm pleural-based cavitary mass in the right lower lobe. This could be infectious or inflammatory in etiology or neoplastic. No other evidence of neoplastic disease within the chest. Left adrenal mass which may reflect an adenoma. Metastatic disease is possible.   PET scan :Hypermetabolism corresponding to a cavitary right lower lobe pulmonary nodule, consistent with primary bronchogenic carcinoma. Multiple hypermetabolic right pleural-based foci are consistent with pleural metastasis. Left posterior medial seventh rib hypermetabolism and probable lucency, suspicious for isolated osseous metastasis.  Left adrenal enlargement and mild hypermetabolism are Indeterminate  Pt had IR guided Bx of RLL mass on 07/17/2015 : NO evidence of CA. Has vasculitis, fibrosis, focal necrosis. AFB and funfal stains were (-).  I discussed the case with  Pathologist Dr. Willeen Niece. He agrees about possiblity of vasculitis/WG.  CTD w/u was (+) for P ANCA.  Was (-) for everything else.  Pt is in moderated back pain > not sure if related to vasculitis. Pt also with urinary issues x 2 yrs. On and off hematuria. Recent U/A had blood.   Likely with WG. Plan : 1. We will refer patient to [facility] rheumatology urgent as patient needs immunosuppressant for her ongoing issues. In the meantime, we will give her Depo-Medrol shot right now. We will also start her on prednisone, 30 mg a day for the next 2 weeks. My assumption is she is seen by rheum within the next 2 weeks and they'll decide what medicine to give her. Patient mentions that her reflux acts up whenever she takes prednisone. We will start her on sucralfate 1 g 4 times a day on top of her omeprazole. 2. I extensively discussed this diagnosis with her and  her 2 other children. We gave her information printout sheets regarding Wegner's.

## 2015-07-30 NOTE — Assessment & Plan Note (Signed)
CT scan abdomen  (06/10/15) no abdominal pathology. Incidental cavitary mass over the left lower lobe, posterior, approx 3 x 3 cm.  CT scan of chest (06/26/15) 1. 3 cm pleural-based cavitary mass in the right lower lobe. This could be infectious or inflammatory in etiology or neoplastic. No other evidence of neoplastic disease within the chest. Left adrenal mass which may reflect an adenoma. Metastatic disease is possible.   PET scan :Hypermetabolism corresponding to a cavitary right lower lobe pulmonary nodule, consistent with primary bronchogenic carcinoma. Multiple hypermetabolic right pleural-based foci are consistent with pleural metastasis. Left posterior medial seventh rib hypermetabolism and probable lucency, suspicious for isolated osseous metastasis.  Left adrenal enlargement and mild hypermetabolism are Indeterminate  Pt had IR guided Bx of RLL mass on 07/17/2015 : NO evidence of CA. Has vasculitis, fibrosis, focal necrosis. AFB and funfal stains were (-).  I discussed the case with  Pathologist Dr. Willeen Niece. He agrees about possiblity of vasculitis/WG.   Plan: 1. Treat as WG. See separate discussion. 2. Need surveillance ct scan to make sure (-) CA. Plan for chest ct scan in July 2017 -- 3 mos from PET scan.

## 2015-07-30 NOTE — Patient Instructions (Signed)
1. We'll have [facility] rheumatology see you urgently within the next 2 weeks. 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 Please read the patient information we gave you regarding Wegner's granulomatosis. 2. As we are waiting for rheumatology, start prednisone, 30 mg a day with meals daily for the next 2 weeks. Hopefully, rheumatology will see within 2 weeks and they'll take it from there. 3. Continue omeprazole 20 mg at bedtime for your reflux. 4. sTart sucralfate, 1 g per tablet, 4 times a day. 5. Let us know if your having issues with prednisone. 6. We will order you Breo, 1 puff daily. Let us know if insurance will not cover it. In the meantime, he can use Symbicort samples, 2 puffs twice a day  160/4.5. Make sure you rinse your  mouth each time using it.  Return to clinic in August.

## 2015-08-08 ENCOUNTER — Encounter: Payer: Self-pay | Admitting: Internal Medicine

## 2015-08-08 ENCOUNTER — Ambulatory Visit (INDEPENDENT_AMBULATORY_CARE_PROVIDER_SITE_OTHER): Payer: Medicare Other | Admitting: Internal Medicine

## 2015-08-08 VITALS — BP 130/70 | HR 60 | Temp 98.2°F | Resp 20 | Wt 156.0 lb

## 2015-08-08 DIAGNOSIS — Z1159 Encounter for screening for other viral diseases: Secondary | ICD-10-CM | POA: Diagnosis not present

## 2015-08-08 DIAGNOSIS — I10 Essential (primary) hypertension: Secondary | ICD-10-CM | POA: Diagnosis not present

## 2015-08-08 DIAGNOSIS — F413 Other mixed anxiety disorders: Secondary | ICD-10-CM

## 2015-08-08 DIAGNOSIS — M549 Dorsalgia, unspecified: Secondary | ICD-10-CM | POA: Insufficient documentation

## 2015-08-08 DIAGNOSIS — M546 Pain in thoracic spine: Secondary | ICD-10-CM | POA: Diagnosis not present

## 2015-08-08 DIAGNOSIS — J439 Emphysema, unspecified: Secondary | ICD-10-CM

## 2015-08-08 DIAGNOSIS — F329 Major depressive disorder, single episode, unspecified: Secondary | ICD-10-CM

## 2015-08-08 DIAGNOSIS — F32A Depression, unspecified: Secondary | ICD-10-CM

## 2015-08-08 MED ORDER — ORPHENADRINE CITRATE ER 100 MG PO TB12: 100.0000 mg | ORAL_TABLET | Freq: Two times a day (BID) | ORAL | Status: DC | PRN

## 2015-08-08 MED ORDER — DIAZEPAM 5 MG PO TABS: 2.5000 mg | ORAL_TABLET | Freq: Two times a day (BID) | ORAL | Status: DC | PRN

## 2015-08-08 NOTE — Patient Instructions (Addendum)
Please continue all other medications as before, and refills have been done if requested, including the muscle relaxer  Please have the pharmacy call with any other refills you may need.  Please continue your efforts at being more active, low cholesterol diet, and weight control.  You are otherwise up to date with prevention measures today.  Please keep your appointments with your specialists as you may have planned  Please keep your appt with Dr Quay Burow as you have planned

## 2015-08-08 NOTE — Progress Notes (Signed)
Pre visit review using our clinic review tool, if applicable. No additional management support is needed unless otherwise documented below in the visit note. 

## 2015-08-08 NOTE — Progress Notes (Signed)
Subjective:    Patient ID: [name] [name], female    DOB: Apr 28, 1945, 70 y.o.   MRN: [id number]  HPI   Here with anxiety f/u - Just had recent cancer scare now s/p pulm bx, fortunately benign, and she is grateful for all the good care and attention.  Has been irritated with urology Dr Karsten Ro, "I know my body" and "somethings wrong with my body."  Had been taking mult AZO, but not currently.  Did see urology post CT and cystoscopy and reassured, Unfortunately had gross hematuria 3 days later, seen at [place] with UTI, tx with antibiotic shot and 10 days antibx (seemed to work better than the 7 days she has been tx with in the past).  Has long hx of anxiety, has taken valium sparingly in the past from former PCP, per pt has tried mult SSRI which have not helped. Was not able to get refill here as Dr Asa Lente not in office as has changed positions. Next appt July 6 with Dr Quay Burow. Denies worsening depressive symptoms, suicidal ideation, or panic.  Has hx of mult comorbids.  Last rx for 10 pills only per hematology mar 2017.  Now on high dose prednisone with some emotional lability with this as well. Does not want pain medication. Pt denies chest pain, increased sob or doe, wheezing, orthopnea, PND, increased LE swelling, palpitations, dizziness or syncope.  Pt continues to have recurring LBP without change in severity, bowel or bladder change, fever, wt loss,  worsening LE pain/numbness/weakness, gait change or falls, asks for orphenadrine ER 100 mg Past Medical History  Diagnosis Date  . Hereditary hemochromatosis ([place]) 2004  . ASTHMA   . HYPERTENSION   . GERD   . Chronic kidney disease   . Arthritis     left shoulder   . Headache(784.0)     sinus headaches   . H/O hiatal hernia   . Anxiety   . Hyperlipidemia   . CAD (coronary artery disease)   . Depression   . Hypertension   . Urinary tract infection   . COPD (chronic obstructive pulmonary disease) with emphysema ([place])     mild dz on  PFTs 11/2013  . 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 Hx of adenomatous polyp of colon 07/25/2014  . Osteopenia 10/07/2014    DEXA @ [facility] 09/2014: -1.9 L fem  . Other mixed anxiety disorders 06/16/2015   Past Surgical History  Procedure Laterality Date  . Cholecystectomy  03/29/09  . Appendectomy  03/29/81  . Abdominal hysterectomy  03/29/81  . Bladder tack  03/29/2002  . Other surgical history      cyst removed from intestines to ovary    . Other surgical history      tendon surgery left wrist   . Cystoscopy  01/24/14    NEGATIVE;Dr Ottelin  . Colonoscopy w/ biopsies    . Esophagogastroduodenoscopy      reports that she has been smoking Cigarettes.  She has a 16.5 pack-year smoking history. She has never used smokeless tobacco. She reports that she does not drink alcohol or use illicit drugs. family history includes Alcohol abuse in her other; Arthritis in her other; Breast cancer (age of onset: 83) in her mother; Cirrhosis in her brother, brother, and brother; Diabetes in her father; Heart disease in her father and mother; Hypertension in her mother; Lung cancer in her brother. There is no history of Colon cancer,  Colon polyps, Esophageal cancer, Rectal cancer, or Stomach cancer. Allergies  Allergen Reactions  . Other Other (See Comments)    Please if giving pt anesthesia - she would like to have a scopoline patch to prevent nausea   . Ciprofloxacin Hives    Blisters on legs  . Codeine Nausea And Vomiting    Sweating, "passes out"  . Macrobid [Nitrofurantoin Monohyd Macro]     Stool incontinence  . Morphine And Related     "hospital bed was shaking"  . Prevnar [Pneumococcal 13-Val Conj Vacc] Palpitations    Pt c/o redness, swelling on arm after shot.  Pt c/o having respiratory problems and coughing ever since she got it.    Current Outpatient Prescriptions on File Prior to Visit  Medication Sig Dispense  Refill  . acetaminophen (TYLENOL) 500 MG tablet Take 500-1,000 mg by mouth every 8 (eight) hours as needed for mild pain or moderate pain.    Marland Kitchen albuterol (PROVENTIL) (2.5 MG/3ML) 0.083% nebulizer solution Take 3 mLs (2.5 mg total) by nebulization every 6 (six) hours as needed for wheezing or shortness of breath. 75 mL 2  . aspirin EC 81 MG tablet Take 81 mg by mouth daily. Reported on 06/25/2015 150 tablet 2  . budesonide-formoterol (SYMBICORT) 160-4.5 MCG/ACT inhaler Inhale 2 puffs into the lungs 2 (two) times daily. 1 Inhaler 0  . diazepam (VALIUM) 5 MG tablet Take 0.5-1 tablets (2.5-5 mg total) by mouth every 12 (twelve) hours as needed for anxiety. 10 tablet 0  . fexofenadine (ALLEGRA) 180 MG tablet Take 180 mg by mouth daily.    . fluticasone furoate-vilanterol (BREO ELLIPTA) 100-25 MCG/INH AEPB Inhale 1 puff into the lungs daily. 1 each 6  . omeprazole (PRILOSEC) 20 MG capsule Take 1 capsule (20 mg total) by mouth daily. 90 capsule 1  . predniSONE (DELTASONE) 10 MG tablet Take 3 tablets (30 mg total) by mouth daily. For 2 weeks 42 tablet 0  . sucralfate (CARAFATE) 1 g tablet Take 1 tablet (1 g total) by mouth 4 (four) times daily. 120 tablet 0  . [DISCONTINUED] potassium chloride (KLOR-CON) 10 MEQ CR tablet Take 1 tablet (10 mEq total) by mouth daily. 30 tablet 5   No current facility-administered medications on file prior to visit.   Review of Systems  Constitutional: Negative for unusual diaphoresis or night sweats HENT: Negative for ear swelling or discharge Eyes: Negative for worsening visual haziness  Respiratory: Negative for choking and stridor.   Gastrointestinal: Negative for distension or worsening eructation Genitourinary: Negative for retention or change in urine volume.  Musculoskeletal: Negative for other MSK pain or swelling Skin: Negative for color change and worsening wound Neurological: Negative for tremors and numbness other than noted  Psychiatric/Behavioral:  Negative for decreased concentration or agitation other than above       Objective:   Physical Exam BP 130/70 mmHg  Pulse 60  Temp(Src) 98.2 F (36.8 C) (Oral)  Resp 20  Wt 156 lb (70.761 kg)  SpO2 95% VS noted,  Constitutional: Pt appears in no apparent distress HENT: Head: NCAT.  Right Ear: External ear normal.  Left Ear: External ear normal.  Eyes: . Pupils are equal, round, and reactive to light. Conjunctivae and EOM are normal Neck: Normal range of motion. Neck supple.  Cardiovascular: Normal rate and regular rhythm.   Pulmonary/Chest: Effort normal and breath sounds without rales or wheezing.  Abd:  Soft, NT, ND, + BS Neurological: Pt is alert. Not confused , motor grossly intact  Skin: Skin is warm. No rash, no LE edema Psychiatric: Pt behavior is normal. No agitation. 2+ nervous     Assessment & Plan:

## 2015-08-09 NOTE — Assessment & Plan Note (Addendum)
Mod to severe, d/w pt, declines other anxiety med or counseling,  to f/u any worsening symptoms or concerns  Note:  Total time for pt hx, exam, review of record with pt in the room, determination of diagnoses and plan for further eval and tx is > 40 min, with over 50% spent in coordination and counseling of patient

## 2015-08-09 NOTE — Assessment & Plan Note (Signed)
stable overall by history and exam, recent data reviewed with pt, and pt to continue medical treatment as before,  to f/u any worsening symptoms or concerns SpO2 Readings from Last 3 Encounters:  08/08/15 95%  07/30/15 94%  07/17/15 95%

## 2015-08-09 NOTE — Assessment & Plan Note (Signed)
C/w msk strain, for muscle relaxer prn,  to f/u any worsening symptoms or concerns 

## 2015-08-09 NOTE — Assessment & Plan Note (Signed)
BP Readings from Last 3 Encounters:  08/08/15 130/70  07/30/15 132/88  07/17/15 135/75   stable overall by history and exam, recent data reviewed with pt, and pt to continue medical treatment as before,  to f/u any worsening symptoms or concerns

## 2015-08-09 NOTE — Assessment & Plan Note (Signed)
stable overall by history and exam, recent data reviewed with pt, and pt to continue medical treatment as before,  to f/u any worsening symptoms or concerns Lab Results  Component Value Date   WBC 10.1 07/17/2015   HGB 14.0 07/17/2015   HCT 41.0 07/17/2015   PLT 277 07/17/2015   GLUCOSE 110* 07/23/2015   CHOL 195 09/26/2014   TRIG 198.0* 09/26/2014   HDL 35.60* 09/26/2014   LDLCALC 120* 09/26/2014   ALT 13 07/23/2015   AST 12 07/23/2015   NA 140 07/23/2015   K 4.1 07/23/2015   CL 104 07/23/2015   CREATININE 0.89 07/23/2015   BUN 12 07/23/2015   CO2 27 07/23/2015   TSH 1.64 02/18/2014   INR 1.14 07/17/2015   HGBA1C 5.3 09/26/2014

## 2015-08-26 ENCOUNTER — Telehealth: Payer: Self-pay | Admitting: Cardiology

## 2015-08-26 NOTE — Telephone Encounter (Signed)
NEw MEssage  Pt stated that after being diagnosed w/ Wagners disease- pt stated that taking so much prednisone- her HR has increased- wanted to discuss / RN. Please call back and discuss.

## 2015-08-26 NOTE — Telephone Encounter (Signed)
Spoke with pt, the rheumatologist that is treating her asked her to call us. They are wanting her to exercise but she has been unable to due to her heart rate being 90 to 120's with little exertion. She is taking prednisone 60 mg daily. They are asking [place] to treat her tachycardia. Pt has not been seen since 2015, Follow up scheduled with rhonda barrett pa. Patient voiced understanding of appointment time and location.

## 2015-08-28 ENCOUNTER — Ambulatory Visit (INDEPENDENT_AMBULATORY_CARE_PROVIDER_SITE_OTHER): Payer: Medicare Other | Admitting: Physician Assistant

## 2015-08-28 ENCOUNTER — Encounter: Payer: Self-pay | Admitting: Physician Assistant

## 2015-08-28 VITALS — BP 160/88 | HR 86 | Ht 63.0 in | Wt 154.8 lb

## 2015-08-28 DIAGNOSIS — I10 Essential (primary) hypertension: Secondary | ICD-10-CM

## 2015-08-28 DIAGNOSIS — R0789 Other chest pain: Secondary | ICD-10-CM

## 2015-08-28 DIAGNOSIS — I479 Paroxysmal tachycardia, unspecified: Secondary | ICD-10-CM

## 2015-08-28 MED ORDER — METOPROLOL SUCCINATE ER 50 MG PO TB24: 50.0000 mg | ORAL_TABLET | Freq: Every day | ORAL | Status: AC

## 2015-08-28 NOTE — Progress Notes (Signed)
Cardiology Office Note   Date:  08/28/2015   ID:  [name] [name], [name] 04/02/1945, MRN [id number]  PCP:  [name], MD  Cardiologist:  Dr Alcide Evener, PA-C   No chief complaint on file.   History of Present Illness: [name] [name] is a 70 y.o. female with a history of non-obs dz by cardiac CTA 2013 (Normal LM, 25-50% LAD, nonocclusive plaque in the circumflex). Calcium score 42. Echocardiogram in May of 2013 showed normal LV function, mild LVH and mildly reduced RV function. Nuclear study 11/14 showed EF 90 and normal perfusion. Statin intolerant, tob use, hemochromatosis, HTN, GERD, CKD, HLD,   Last o.v. 2015, doing well. Dx Wegener's granulomatosis, on prednisone, HR elevated, cards asked to see.  [name] [name] presents for evaluation of tachycardia.  [name] was struggling with pain until she got started on the prednisone. She is currently taking 60 mg a day.  Since being on the prednisone, she has struggled in other ways. The pain is better, but her blood pressure and heart rate are running high. She feels jittery and is not sleeping well. Her heart rate is elevated and it will go up into the 120s with minimal exertion. She is not having any chest pain with exertion or during episodes of elevated heart rate.   She developed chest fullness that lasted several days. She had an episode of night sweats to the point that she had to wash her mattress cover. After that, her chest fullness resolved. She has been uncomfortable due to abdominal gas and wonders if she can take anything for that. She was having problems with hypokalemia on her previous blood pressure medication, probably HCTZ, so it was discontinued. She is not currently taking anything for hypertension.  She hopes they will start decreasing the prednisone soon.   Past Medical History  Diagnosis Date  . Hereditary hemochromatosis ([place]) 2004  . ASTHMA   . HYPERTENSION   . GERD   . Chronic  kidney disease   . Arthritis     left shoulder   . Headache(784.0)     sinus headaches   . H/O hiatal hernia   . Anxiety   . Hyperlipidemia   . CAD (coronary artery disease)   . Depression   . Hypertension   . Urinary tract infection   . COPD (chronic obstructive pulmonary disease) with emphysema ([place])     mild dz on PFTs 11/2013  . 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 Hx of adenomatous polyp of colon 07/25/2014  . Osteopenia 10/07/2014    DEXA @ [facility] 09/2014: -1.9 L fem  . Other mixed anxiety disorders 06/16/2015    Past Surgical History  Procedure Laterality Date  . Cholecystectomy  03/29/09  . Appendectomy  03/29/81  . Abdominal hysterectomy  03/29/81  . Bladder tack  03/29/2002  . Other surgical history      cyst removed from intestines to ovary    . Other surgical history      tendon surgery left wrist   . Cystoscopy  01/24/14    NEGATIVE;Dr Ottelin  . Colonoscopy w/ biopsies    . Esophagogastroduodenoscopy      Current Outpatient Prescriptions  Medication Sig Dispense Refill  . acetaminophen (TYLENOL) 500 MG tablet Take 500-1,000 mg by mouth every 8 (eight) hours as needed for mild pain or moderate pain.    Marland Kitchen albuterol (PROVENTIL) (2.5 MG/3ML) 0.083%  nebulizer solution Take 3 mLs (2.5 mg total) by nebulization every 6 (six) hours as needed for wheezing or shortness of breath. 75 mL 2  . aspirin EC 81 MG tablet Take 81 mg by mouth daily. Reported on 06/25/2015 150 tablet 2  . budesonide-formoterol (SYMBICORT) 160-4.5 MCG/ACT inhaler Inhale 2 puffs into the lungs 2 (two) times daily. 1 Inhaler 0  . diazepam (VALIUM) 5 MG tablet Take 0.5-1 tablets (2.5-5 mg total) by mouth every 12 (twelve) hours as needed for anxiety. 60 tablet 0  . fexofenadine (ALLEGRA) 180 MG tablet Take 180 mg by mouth daily.    . fluticasone furoate-vilanterol (BREO ELLIPTA) 100-25 MCG/INH AEPB Inhale 1 puff into the lungs  daily. 1 each 6  . omeprazole (PRILOSEC) 20 MG capsule Take 1 capsule (20 mg total) by mouth daily. 90 capsule 1  . orphenadrine (NORFLEX) 100 MG tablet Take 1 tablet (100 mg total) by mouth 2 (two) times daily as needed for muscle spasms. 60 tablet 2  . predniSONE (DELTASONE) 10 MG tablet Take 3 tablets (30 mg total) by mouth daily. For 2 weeks 42 tablet 0  . sucralfate (CARAFATE) 1 g tablet Take 1 tablet (1 g total) by mouth 4 (four) times daily. 120 tablet 0  . sulfamethoxazole-trimethoprim (BACTRIM,SEPTRA) 200-40 MG/5ML suspension Take 10 mLs by mouth 3 (three) times a week.    . [DISCONTINUED] potassium chloride (KLOR-CON) 10 MEQ CR tablet Take 1 tablet (10 mEq total) by mouth daily. 30 tablet 5   No current facility-administered medications for this visit.    Allergies:   Other; Ciprofloxacin; Codeine; Macrobid; Morphine and related; and Prevnar    Social History:  The patient  reports that she quit smoking about 15 months ago. Her smoking use included Cigarettes. She has a 16.5 pack-year smoking history. She has never used smokeless tobacco. She reports that she does not drink alcohol or use illicit drugs.   Family History:  The patient's family history includes Alcohol abuse in her other; Arthritis in her other; Breast cancer (age of onset: 88) in her mother; Cirrhosis in her brother, brother, and brother; Diabetes in her father; Heart disease in her father and mother; Hypertension in her mother; Lung cancer in her brother. There is no history of Colon cancer, Colon polyps, Esophageal cancer, Rectal cancer, or Stomach cancer.    ROS:  Please see the history of present illness. All other systems are reviewed and negative.    PHYSICAL EXAM: VS:  BP 160/88 mmHg  Pulse 86  Ht 5\' 3"  (1.6 m)  Wt 154 lb 12.8 oz (70.217 kg)  BMI 27.43 kg/m2 , BMI Body mass index is 27.43 kg/(m^2). GEN: Well nourished, well developed, female in no acute distress HEENT: normal for age  Neck: no JVD, no  carotid bruit, no masses Cardiac: RRR; no murmur, no rubs, or gallops Respiratory:  clear to auscultation bilaterally, normal work of breathing GI: soft, nontender, nondistended, + BS MS: no deformity or atrophy; no edema; distal pulses are 2+ in all 4 extremities  Skin: warm and dry, no rash Neuro:  Strength and sensation are intact Psych: euthymic mood, full affect   EKG:  EKG is ordered today. The ekg ordered today demonstrates sinus rhythm, artifact making the baseline difficult to interpret but no obvious ischemic changes. Heart rate 86  MV: 01/2013 Impression Exercise Capacity: Good exercise capacity. BP Response: Normal blood pressure response. Clinical Symptoms: No significant symptoms noted. ECG Impression: No significant ST segment change suggestive of ischemia.  Comparison with Prior Nuclear Study: No images to compare Overall Impression: Normal stress nuclear study. LV Ejection Fraction: 90%. LV Wall Motion: Vigorous LV contraction. No wall motion abnormalities  Recent Labs: 07/17/2015: Hemoglobin 14.0; Platelets 277 07/23/2015: ALT 13; BUN 12; Creatinine, Ser 0.89; Potassium 4.1; Sodium 140    Lipid Panel    Component Value Date/Time   CHOL 195 09/26/2014 0908   TRIG 198.0* 09/26/2014 0908   TRIG 318 11/11/2009   HDL 35.60* 09/26/2014 0908   CHOLHDL 5 09/26/2014 0908   VLDL 39.6 09/26/2014 0908   LDLCALC 120* 09/26/2014 0908     Wt Readings from Last 3 Encounters:  08/28/15 154 lb 12.8 oz (70.217 kg)  08/08/15 156 lb (70.761 kg)  07/30/15 153 lb (69.4 kg)     Other studies Reviewed: Additional studies/ records that were reviewed today include: Previous office notes, testing.  ASSESSMENT AND PLAN:  1.  Paroxysmal Tachycardia: Her ECG today is sinus rhythm. She feels her heart rate speed up out of proportion to exertion, but it will slow back down if she rests. the symptoms began after she was started on the prednisone. She does not think it is  beating irregularly, she just feels it pounds.  She will benefit from a beta blocker and we will start 1. Currently I feel this is sinus tachycardia as her heart rate has only been in the 120s. If her symptoms do not improve on the beta blocker, or her heart rate is 150 or greater, we will need to do an event monitor to make sure she is not having atrial fibrillation.   2. Hypertension: Her blood pressure is elevated today in the office and she states it has been running this high at home as well. We will add Toprol-XL 50 mg, start with 25 mg for a few days to make sure she tolerates it and then increase to 50 mg. This can be increased as needed for better heart rate and blood pressure control.  3. Abdominal gas: She has a chronic problem with diarrhea since her gallbladder was removed. She is encouraged to try simethicone and contact her family physician if this does not help.  4. Chest fullness: She is concerned that there is something wrong with her heart, there  might be fluid around her heart. Her symptoms improved after an episode of night sweats. There was no exertional component. We will check an echocardiogram and consider further testing if it is abnormal.   Current medicines are reviewed at length with the patient today.  The patient does not have concerns regarding medicines.  The following changes have been made:  Add Toprol-XL 50 mg daily  Labs/ tests ordered today include:   Orders Placed This Encounter  Procedures  . EKG 12-Lead  . ECHOCARDIOGRAM COMPLETE     Disposition:   FU with Dr. Stanford Breed   Signed, Rosaria Ferries, PA-C  08/28/2015 10:42 AM    [place] Phone: [phone]; Fax: [phone]  This note was written with the assistance of speech recognition software. Please excuse any transcriptional errors.

## 2015-08-28 NOTE — Patient Instructions (Signed)
Medication Instructions: Rosaria Ferries, PA-C, has recommended making the following medication changes: 1. START Metoprolol Succinate 50 mg - take 0.5 tablet daily for 4 days then increase to a whole tablet daily thereafter 2. You may take Simethicone for gas  Labwork: NONE ORDERED  Testing/Procedures: 1. Echocardiogram - Your physician has requested that you have an echocardiogram. Echocardiography is a painless test that uses sound waves to create images of your heart. It provides your doctor with information about the size and shape of your heart and how well your heart's chambers and valves are working. This procedure takes approximately one hour. There are no restrictions for this procedure.  Follow-up: Suanne Marker recommends that you schedule a follow-up appointment in 1 month with her or Dr Stanford Breed.  If you need a refill on your cardiac medications before your next appointment, please call your pharmacy.

## 2015-09-16 ENCOUNTER — Other Ambulatory Visit: Payer: Self-pay

## 2015-09-16 ENCOUNTER — Ambulatory Visit (HOSPITAL_COMMUNITY): Payer: Medicare Other | Attending: Cardiology

## 2015-09-16 DIAGNOSIS — I2510 Atherosclerotic heart disease of native coronary artery without angina pectoris: Secondary | ICD-10-CM | POA: Insufficient documentation

## 2015-09-16 DIAGNOSIS — I509 Heart failure, unspecified: Secondary | ICD-10-CM | POA: Diagnosis not present

## 2015-09-16 DIAGNOSIS — E785 Hyperlipidemia, unspecified: Secondary | ICD-10-CM | POA: Insufficient documentation

## 2015-09-16 DIAGNOSIS — Z720 Tobacco use: Secondary | ICD-10-CM | POA: Diagnosis not present

## 2015-09-16 DIAGNOSIS — I110 Hypertensive heart disease with heart failure: Secondary | ICD-10-CM | POA: Insufficient documentation

## 2015-09-16 DIAGNOSIS — R0789 Other chest pain: Secondary | ICD-10-CM

## 2015-09-16 LAB — ECHOCARDIOGRAM COMPLETE
AOASC: 31 cm
E/e' ratio: 9.16
EWDT: 215 ms
FS: 30 % (ref 28–44)
IV/PV OW: 0.99
LA diam end sys: 34 mm
LA vol index: 17.9 mL/m2
LADIAMINDEX: 1.9 cm/m2
LASIZE: 34 mm
LAVOL: 32 mL
LAVOLA4C: 34 mL
LV E/e' medial: 9.16
LVEEAVG: 9.16
LVELAT: 6.53 cm/s
LVOT VTI: 23.4 cm
LVOT area: 2.54 cm2
LVOT diameter: 18 mm
LVOT peak grad rest: 8 mmHg
LVOT peak vel: 142 cm/s
LVOTSV: 59 mL
MV Dec: 215
MVPKAVEL: 107 m/s
MVPKEVEL: 59.8 m/s
PW: 13.5 mm — AB (ref 0.6–1.1)
RV LATERAL S' VELOCITY: 13.8 cm/s
RV sys press: 24 mmHg
Reg peak vel: 228 cm/s
TDI e' lateral: 6.53
TDI e' medial: 3.9
TRMAXVEL: 228 cm/s

## 2015-09-18 ENCOUNTER — Telehealth: Payer: Self-pay | Admitting: Internal Medicine

## 2015-09-18 NOTE — Telephone Encounter (Signed)
Spoke with pt and suggested she try phazyme otc for gas. Low gas diet mailed to pt.

## 2015-09-19 ENCOUNTER — Encounter: Payer: Self-pay | Admitting: Nurse Practitioner

## 2015-09-19 ENCOUNTER — Telehealth: Payer: Self-pay | Admitting: Cardiology

## 2015-09-19 ENCOUNTER — Ambulatory Visit (INDEPENDENT_AMBULATORY_CARE_PROVIDER_SITE_OTHER): Payer: Medicare Other | Admitting: Nurse Practitioner

## 2015-09-19 VITALS — BP 126/66 | HR 102 | Ht 63.0 in | Wt 148.0 lb

## 2015-09-19 DIAGNOSIS — I10 Essential (primary) hypertension: Secondary | ICD-10-CM

## 2015-09-19 DIAGNOSIS — E785 Hyperlipidemia, unspecified: Secondary | ICD-10-CM | POA: Diagnosis not present

## 2015-09-19 DIAGNOSIS — R002 Palpitations: Secondary | ICD-10-CM | POA: Diagnosis not present

## 2015-09-19 DIAGNOSIS — Z720 Tobacco use: Secondary | ICD-10-CM | POA: Diagnosis not present

## 2015-09-19 MED ORDER — METOPROLOL SUCCINATE ER 25 MG PO TB24: 25.0000 mg | ORAL_TABLET | Freq: Every day | ORAL | Status: AC

## 2015-09-19 NOTE — Telephone Encounter (Signed)
Patient needs one of 2 things.  1. Best option would be a nurse visit with vital sign check and an ECG. I would be happy to review the ECG in the vital signs to decide if she is in atrial fibrillation or will tolerate more blood pressure control. 2. Second best option is a 48 hour Holter to determine if she is having atrial fibrillation and how much time she is spending with an elevated heart rate. Either way I need to know her blood pressure to determine if she can tolerate more beta blocker.   The beta blocker is helping, but  I still believe steroids are the culprit.  Thanks

## 2015-09-19 NOTE — Patient Instructions (Addendum)
We will be checking the following labs today - NONE   Medication Instructions:    Continue with your current medicines. BUT  I am increasing the Toprol to 50 mg in the Am and 25 mg in the pm - I have sent in a RX for the 25 mg to your drug store. This will hopefully help with your elevated heart rate.     Testing/Procedures To Be Arranged:  N/A  Follow-Up:   See Dr. Stanford Breed as planned in August    Other Special Instructions:   If your palpitations persist, we will place a heart monitor.     If you need a refill on your cardiac medications before your next appointment, please call your pharmacy.   Call the [place] office at [phone] if you have any questions, problems or concerns.

## 2015-09-19 NOTE — Telephone Encounter (Signed)
Spoke with pt, she will see the np lori gerhardt today at 2:30 pm.  She will bring all her medications to that appt.

## 2015-09-19 NOTE — Progress Notes (Signed)
CARDIOLOGY OFFICE NOTE  Date:  09/19/2015    [name] [name] Date of Birth: 17-Feb-1946 Medical Record [id number]  PCP:  [name], MD  Cardiologist:  [facility]  Chief Complaint  Patient presents with  . Irregular Heart Beat  . Palpitations    Work in visit - seen for Dr. Stanford Breed    History of Present Illness: [name] [name] is a 70 y.o. female who presents today for a work in visit. Seen for Dr. Stanford Breed.   She has a history of non-obs dz by cardiac CTA 2013 (Normal LM, 25-50% LAD, nonocclusive plaque in the circumflex). Calcium score 42. Echocardiogram in May of 2013 showed normal LV function, mild LVH and mildly reduced RV function. Nuclear study 11/14 showed EF 90 and normal perfusion. Other issues include HLD - statin intolerant, tobacco abuse, hemochromatosis (followed by [name]), HTN, GERD, & CKD. She also has been diagnosed with Wegener's granulomatosis following abnormal PET scan and has been treated with steroid therapy, chemo and methotrexate therapy.   Just seen at the beginning of this month by Rosaria Ferries, PA for elevated HR and some chest pain. Felt to have sinus tachycardia. BP was up. Beta blocker was started. Echo obtained - see below.   Phone call today -  "Spoke with pt, she continues to have palpitations, esp for the last 2-3 days.   Her heart rate this am was 117, she took the toprol 50 mg about 8:45 am and her rate is still 100-101.  Her prednisone was stopped on wednesday this week and she was placed on methylprednisohone 4 mg 3 tablets twice daily. She is not sure if the increased rate is because of the medication change or not. Will forward for rhonda barrett pa review and advise."  Thus added to my schedule for possible placement of heart monitor.   Comes in today. Here alone. She says she "is a wreck". Feels "so bad". Very tired. Does not feel like anyone is listening to her about her symptoms that she feels is related to her treatment for  Wegener's. For another treatment next week.  Remains on steroids. Losing weight. Woke with a headache today and was nauseated. Noted her heart beating fast - does not know how to check her pulse. No passing out. No chest pain.   Past Medical History  Diagnosis Date  . Hereditary hemochromatosis ([place]) 2004  . ASTHMA   . HYPERTENSION   . GERD   . Chronic kidney disease   . Arthritis     left shoulder   . Headache(784.0)     sinus headaches   . H/O hiatal hernia   . Anxiety   . Hyperlipidemia   . CAD (coronary artery disease)   . Depression   . Hypertension   . Urinary tract infection   . COPD (chronic obstructive pulmonary disease) with emphysema ([place])     mild dz on PFTs 11/2013  . 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 Hx of adenomatous polyp of colon 07/25/2014  . Osteopenia 10/07/2014    DEXA @ [facility] 09/2014: -1.9 L fem  . Other mixed anxiety disorders 06/16/2015    Past Surgical History  Procedure Laterality Date  . Cholecystectomy  03/29/09  . Appendectomy  03/29/81  . Abdominal hysterectomy  03/29/81  . Bladder tack  03/29/2002  . Other surgical history      cyst removed from intestines to ovary    .  Other surgical history      tendon surgery left wrist   . Cystoscopy  01/24/14    NEGATIVE;Dr Ottelin  . Colonoscopy w/ biopsies    . Esophagogastroduodenoscopy       Medications: Current Outpatient Prescriptions  Medication Sig Dispense Refill  . acetaminophen (TYLENOL) 500 MG tablet Take 500-1,000 mg by mouth every 8 (eight) hours as needed for mild pain or moderate pain.    Marland Kitchen albuterol (PROVENTIL) (2.5 MG/3ML) 0.083% nebulizer solution Take 3 mLs (2.5 mg total) by nebulization every 6 (six) hours as needed for wheezing or shortness of breath. 75 mL 2  . aspirin EC 81 MG tablet Take 81 mg by mouth daily. Reported on 06/25/2015 150 tablet 2  . budesonide-formoterol (SYMBICORT) 160-4.5 MCG/ACT  inhaler Inhale 2 puffs into the lungs 2 (two) times daily. 1 Inhaler 0  . fexofenadine (ALLEGRA) 180 MG tablet Take 180 mg by mouth daily.    . fluticasone furoate-vilanterol (BREO ELLIPTA) 100-25 MCG/INH AEPB Inhale 1 puff into the lungs daily. 1 each 6  . methylPREDNISolone (MEDROL) 4 MG tablet Take 12 mg by mouth 2 (two) times daily.    . metoprolol succinate (TOPROL-XL) 50 MG 24 hr tablet Take 1 tablet (50 mg total) by mouth daily. Take with or immediately following a meal. 90 tablet 3  . omeprazole (PRILOSEC) 20 MG capsule Take 1 capsule (20 mg total) by mouth daily. 90 capsule 1  . sucralfate (CARAFATE) 1 g tablet Take 1 tablet (1 g total) by mouth 4 (four) times daily. 120 tablet 0  . sulfamethoxazole-trimethoprim (BACTRIM,SEPTRA) 200-40 MG/5ML suspension Take 10 mLs by mouth 3 (three) times a week.    . diazepam (VALIUM) 5 MG tablet Take 0.5-1 tablets (2.5-5 mg total) by mouth every 12 (twelve) hours as needed for anxiety. (Patient not taking: Reported on 09/19/2015) 60 tablet 0  . metoprolol succinate (TOPROL XL) 25 MG 24 hr tablet Take 1 tablet (25 mg total) by mouth at bedtime. 30 tablet 6  . [DISCONTINUED] potassium chloride (KLOR-CON) 10 MEQ CR tablet Take 1 tablet (10 mEq total) by mouth daily. 30 tablet 5   No current facility-administered medications for this visit.    Allergies: Allergies  Allergen Reactions  . Other Other (See Comments)    Please if giving pt anesthesia - she would like to have a scopoline patch to prevent nausea   . Ciprofloxacin Hives    Blisters on legs  . Codeine Nausea And Vomiting    Sweating, "passes out"  . Macrobid [Nitrofurantoin Monohyd Macro]     Stool incontinence  . Morphine And Related     "hospital bed was shaking"  . Prevnar [Pneumococcal 13-Val Conj Vacc] Palpitations    Pt c/o redness, swelling on arm after shot.  Pt c/o having respiratory problems and coughing ever since she got it.     Social History: The patient  reports that  she quit smoking about 16 months ago. Her smoking use included Cigarettes. She has a 16.5 pack-year smoking history. She has never used smokeless tobacco. She reports that she does not drink alcohol or use illicit drugs.   Family History: The patient's family history includes Alcohol abuse in her other; Arthritis in her other; Breast cancer (age of onset: 45) in her mother; Cirrhosis in her brother, brother, and brother; Diabetes in her father; Heart disease in her father and mother; Hypertension in her mother; Lung cancer in her brother. There is no history of Colon cancer, Colon polyps,  Esophageal cancer, Rectal cancer, or Stomach cancer.   Review of Systems: Please see the history of present illness.   Otherwise, the review of systems is positive for none.   All other systems are reviewed and negative.   Physical Exam: VS:  BP 126/66 mmHg  Pulse 102  Ht 5\' 3"  (1.6 m)  Wt 148 lb (67.132 kg)  BMI 26.22 kg/m2 .  BMI Body mass index is 26.22 kg/(m^2).  Wt Readings from Last 3 Encounters:  09/19/15 148 lb (67.132 kg)  08/28/15 154 lb 12.8 oz (70.217 kg)  08/08/15 156 lb (70.761 kg)    General: Tearful. She looks tired. She is alert and in no acute distress.  HEENT: Normal. She appears flushed.  Neck: Supple, no JVD, carotid bruits, or masses noted.  Cardiac: Regular rate and rhythm. HR just a little fast but no murmurs, rubs, or gallops. No edema.  Respiratory:  Lungs are clear to auscultation bilaterally with normal work of breathing.  GI: Soft and nontender.  MS: No deformity or atrophy. Gait and ROM intact. Skin: Warm and dry. Color is normal.  Neuro:  Strength and sensation are intact and no gross focal deficits noted.  Psych: Alert, appropriate and with normal affect.   LABORATORY DATA:  EKG:  EKG is ordered today. This demonstrates sinus tach - rate of 102.  Lab Results  Component Value Date   WBC 10.1 07/17/2015   HGB 14.0 07/17/2015   HCT 41.0 07/17/2015   PLT 277  07/17/2015   GLUCOSE 110* 07/23/2015   CHOL 195 09/26/2014   TRIG 198.0* 09/26/2014   HDL 35.60* 09/26/2014   LDLCALC 120* 09/26/2014   ALT 13 07/23/2015   AST 12 07/23/2015   NA 140 07/23/2015   K 4.1 07/23/2015   CL 104 07/23/2015   CREATININE 0.89 07/23/2015   BUN 12 07/23/2015   CO2 27 07/23/2015   TSH 1.64 02/18/2014   INR 1.14 07/17/2015   HGBA1C 5.3 09/26/2014    BNP (last 3 results) No results for input(s): BNP in the last 8760 hours.  ProBNP (last 3 results) No results for input(s): PROBNP in the last 8760 hours.   Other Studies Reviewed Today:  Echo Study Conclusions from 08/2015  - Left ventricle: The cavity size was normal. Wall thickness was  increased in a pattern of mild LVH. Systolic function was  vigorous. The estimated ejection fraction was in the range of 65%  to 70%. Wall motion was normal; there were no regional wall  motion abnormalities. Doppler parameters are consistent with  abnormal left ventricular relaxation (grade 1 diastolic  dysfunction). - Aortic valve: Poorly visualized. There was no stenosis. - Mitral valve: There was no significant regurgitation. - Right ventricle: The cavity size was normal. Systolic function  was normal. - Tricuspid valve: Peak RV-RA gradient (S): 21 mm Hg. - Pulmonary arteries: PA peak pressure: 24 mm Hg (S). - Inferior vena cava: The vessel was normal in size. The  respirophasic diameter changes were in the normal range (>= 50%),  consistent with normal central venous pressure.  Impressions:  - Normal LV size with mild LV hypertrophy, EF 65-70%. Normal RV  size and systolic function. No significant valvular  abnormalities.   Myoview Impression from 2014 Exercise Capacity: Good exercise capacity. BP Response: Normal blood pressure response. Clinical Symptoms: No significant symptoms noted. ECG Impression: No significant ST segment change suggestive of ischemia. Comparison with Prior  Nuclear Study: No images to compare  Overall Impression: Normal stress  nuclear study.  LV Ejection Fraction: 90%. LV Wall Motion: Vigorous LV contraction. No wall motion abnormalities   Darlin Coco  Assessment/Plan: 1. Palpitations/tachycardia - will increase her metoprolol to 50 mg in the AM and 25 mg in the PM. If symptoms persist, will place event monitor.   2. Chest pain - currently denies  3. Tobacco abuse - down to about 1 cigarette per day  4. HTN - BP ok on current regimen. May need to increase her salt to help keep BP up with increase in Toprol.   5. CAD by prior cardiac CT - no active symptoms at this time  6. HLD - statin intolerant   7. Markedly abnormal PET scan - found to have Wegener's - seeing Dr. [name] with rheumatology and currently receiving different therapies. She may be entertaining 2nd opinion elsewhere.   Current medicines are reviewed with the patient today.  The patient does not have concerns regarding medicines other than what has been noted above.  The following changes have been made:  See above.  Labs/ tests ordered today include:   No orders of the defined types were placed in this encounter.     Disposition:   FU with Dr. Stanford Breed and his team going forward.    Patient is agreeable to this plan and will call if any problems develop in the interim.   Signed: Burtis Junes, RN, ANP-C 09/19/2015 3:26 PM  [facility] [address] [place], [place]  [address] Phone: [phone] Fax: [phone]

## 2015-09-19 NOTE — Telephone Encounter (Signed)
Spoke with pt, she continues to have palpitations, esp for the last 2-3 days. Her heart rate this am was 117, she took the toprol 50 mg about 8:45 am and her rate is still 100-101. Her prednisone was stopped on wednesday this week and she was placed on methylprednisohone 4 mg 3 tablets twice daily. She is not sure if the increased rate is because of the medication change or not. Will forward for rhonda barrett pa review and advise

## 2015-09-19 NOTE — Telephone Encounter (Signed)
NEW MESSAGE    Patient c/o Palpitations:  High priority if patient c/o lightheadedness and shortness of breath.  1. How long have you been having palpitations? 6 weeks 2. Are you currently experiencing lightheadedness and shortness of breath? no  3. Have you checked your BP and heart rate? (document readings) 120/87    HR 117  4. Are you experiencing any other symptoms? headaches

## 2015-09-23 ENCOUNTER — Encounter: Payer: Medicare Other | Admitting: Internal Medicine

## 2015-09-23 ENCOUNTER — Emergency Department (HOSPITAL_COMMUNITY): Payer: Medicare Other

## 2015-09-23 ENCOUNTER — Inpatient Hospital Stay (HOSPITAL_COMMUNITY)
Admission: EM | Admit: 2015-09-23 | Discharge: 2015-09-27 | DRG: 439 | Disposition: A | Discharge status: Home / Self Care | Payer: Medicare Other | Attending: Internal Medicine | Admitting: Internal Medicine

## 2015-09-23 ENCOUNTER — Other Ambulatory Visit: Payer: Self-pay

## 2015-09-23 ENCOUNTER — Encounter (HOSPITAL_COMMUNITY): Payer: Self-pay | Admitting: Emergency Medicine

## 2015-09-23 DIAGNOSIS — J449 Chronic obstructive pulmonary disease, unspecified: Secondary | ICD-10-CM | POA: Diagnosis present

## 2015-09-23 DIAGNOSIS — K859 Acute pancreatitis without necrosis or infection, unspecified: Secondary | ICD-10-CM | POA: Diagnosis not present

## 2015-09-23 DIAGNOSIS — I10 Essential (primary) hypertension: Secondary | ICD-10-CM | POA: Diagnosis present

## 2015-09-23 DIAGNOSIS — K219 Gastro-esophageal reflux disease without esophagitis: Secondary | ICD-10-CM | POA: Diagnosis present

## 2015-09-23 DIAGNOSIS — F419 Anxiety disorder, unspecified: Secondary | ICD-10-CM | POA: Diagnosis present

## 2015-09-23 DIAGNOSIS — K863 Pseudocyst of pancreas: Secondary | ICD-10-CM

## 2015-09-23 DIAGNOSIS — R000 Tachycardia, unspecified: Secondary | ICD-10-CM | POA: Diagnosis present

## 2015-09-23 DIAGNOSIS — I2510 Atherosclerotic heart disease of native coronary artery without angina pectoris: Secondary | ICD-10-CM | POA: Diagnosis present

## 2015-09-23 DIAGNOSIS — Z833 Family history of diabetes mellitus: Secondary | ICD-10-CM

## 2015-09-23 DIAGNOSIS — E860 Dehydration: Secondary | ICD-10-CM | POA: Diagnosis present

## 2015-09-23 DIAGNOSIS — F17200 Nicotine dependence, unspecified, uncomplicated: Secondary | ICD-10-CM | POA: Diagnosis present

## 2015-09-23 DIAGNOSIS — F329 Major depressive disorder, single episode, unspecified: Secondary | ICD-10-CM | POA: Diagnosis present

## 2015-09-23 DIAGNOSIS — Z90710 Acquired absence of both cervix and uterus: Secondary | ICD-10-CM | POA: Diagnosis not present

## 2015-09-23 DIAGNOSIS — R109 Unspecified abdominal pain: Secondary | ICD-10-CM | POA: Diagnosis present

## 2015-09-23 DIAGNOSIS — Z8249 Family history of ischemic heart disease and other diseases of the circulatory system: Secondary | ICD-10-CM

## 2015-09-23 DIAGNOSIS — Z9049 Acquired absence of other specified parts of digestive tract: Secondary | ICD-10-CM

## 2015-09-23 DIAGNOSIS — B3749 Other urogenital candidiasis: Secondary | ICD-10-CM | POA: Diagnosis present

## 2015-09-23 DIAGNOSIS — J439 Emphysema, unspecified: Secondary | ICD-10-CM | POA: Diagnosis present

## 2015-09-23 DIAGNOSIS — Z720 Tobacco use: Secondary | ICD-10-CM

## 2015-09-23 DIAGNOSIS — E876 Hypokalemia: Secondary | ICD-10-CM | POA: Diagnosis present

## 2015-09-23 DIAGNOSIS — K8689 Other specified diseases of pancreas: Secondary | ICD-10-CM | POA: Diagnosis present

## 2015-09-23 DIAGNOSIS — K858 Other acute pancreatitis without necrosis or infection: Secondary | ICD-10-CM | POA: Diagnosis present

## 2015-09-23 DIAGNOSIS — M3131 Wegener's granulomatosis with renal involvement: Secondary | ICD-10-CM | POA: Diagnosis present

## 2015-09-23 DIAGNOSIS — I129 Hypertensive chronic kidney disease with stage 1 through stage 4 chronic kidney disease, or unspecified chronic kidney disease: Secondary | ICD-10-CM | POA: Diagnosis present

## 2015-09-23 DIAGNOSIS — E1165 Type 2 diabetes mellitus with hyperglycemia: Secondary | ICD-10-CM | POA: Diagnosis present

## 2015-09-23 DIAGNOSIS — F32A Depression, unspecified: Secondary | ICD-10-CM | POA: Diagnosis present

## 2015-09-23 HISTORY — DX: Wegener's granulomatosis without renal involvement: M31.30

## 2015-09-23 LAB — COMPREHENSIVE METABOLIC PANEL
ALBUMIN: 2.8 g/dL — AB (ref 3.5–5.0)
ALK PHOS: 93 U/L (ref 38–126)
ALT: 59 U/L — ABNORMAL HIGH (ref 14–54)
ANION GAP: 10 (ref 5–15)
AST: 28 U/L (ref 15–41)
BUN: 13 mg/dL (ref 6–20)
CALCIUM: 8.8 mg/dL — AB (ref 8.9–10.3)
CO2: 26 mmol/L (ref 22–32)
Chloride: 100 mmol/L — ABNORMAL LOW (ref 101–111)
Creatinine, Ser: 0.75 mg/dL (ref 0.44–1.00)
GFR calc non Af Amer: >60 mL/min (ref >60)
GLUCOSE: 214 mg/dL — AB (ref 65–99)
POTASSIUM: 3.1 mmol/L — AB (ref 3.5–5.1)
SODIUM: 136 mmol/L (ref 135–145)
Total Bilirubin: 1 mg/dL (ref 0.3–1.2)
Total Protein: 6.2 g/dL — ABNORMAL LOW (ref 6.5–8.1)

## 2015-09-23 LAB — CBC WITH DIFFERENTIAL/PLATELET
BASOS ABS: 0 10*3/uL (ref 0.0–0.1)
BASOS PCT: 0 %
EOS ABS: 0.1 10*3/uL (ref 0.0–0.7)
Eosinophils Relative: 0 %
HEMATOCRIT: 37.6 % (ref 36.0–46.0)
HEMOGLOBIN: 13.9 g/dL (ref 12.0–15.0)
Lymphocytes Relative: 6 %
Lymphs Abs: 0.8 10*3/uL (ref 0.7–4.0)
MCH: 33.7 pg (ref 26.0–34.0)
MCHC: 37 g/dL — AB (ref 30.0–36.0)
MCV: 91.3 fL (ref 78.0–100.0)
MONO ABS: 0.6 10*3/uL (ref 0.1–1.0)
MONOS PCT: 5 %
NEUTROS PCT: 89 %
Neutro Abs: 11.9 10*3/uL — ABNORMAL HIGH (ref 1.7–7.7)
Platelets: 170 10*3/uL (ref 150–400)
RBC: 4.12 MIL/uL (ref 3.87–5.11)
RDW: 14 % (ref 11.5–15.5)
WBC: 13.4 10*3/uL — ABNORMAL HIGH (ref 4.0–10.5)

## 2015-09-23 LAB — I-STAT CHEM 8, ED
BUN: 10 mg/dL (ref 6–20)
CREATININE: 0.8 mg/dL (ref 0.44–1.00)
Calcium, Ion: 1.1 mmol/L — ABNORMAL LOW (ref 1.12–1.23)
Chloride: 99 mmol/L — ABNORMAL LOW (ref 101–111)
Glucose, Bld: 216 mg/dL — ABNORMAL HIGH (ref 65–99)
HEMATOCRIT: 39 % (ref 36.0–46.0)
HEMOGLOBIN: 13.3 g/dL (ref 12.0–15.0)
POTASSIUM: 2.8 mmol/L — AB (ref 3.5–5.1)
SODIUM: 134 mmol/L — AB (ref 135–145)
TCO2: 25 mmol/L (ref 0–100)

## 2015-09-23 LAB — URINALYSIS, ROUTINE W REFLEX MICROSCOPIC
BILIRUBIN URINE: NEGATIVE
Glucose, UA: NEGATIVE mg/dL
Ketones, ur: NEGATIVE mg/dL
Nitrite: NEGATIVE
PH: 6.5 (ref 5.0–8.0)
Protein, ur: NEGATIVE mg/dL
SPECIFIC GRAVITY, URINE: 1.023 (ref 1.005–1.030)

## 2015-09-23 LAB — I-STAT CG4 LACTIC ACID, ED: Lactic Acid, Venous: 2.73 mmol/L (ref 0.5–1.9)

## 2015-09-23 LAB — HEPATIC FUNCTION PANEL
ALK PHOS: 89 U/L (ref 38–126)
ALT: 62 U/L — ABNORMAL HIGH (ref 14–54)
AST: 29 U/L (ref 15–41)
Albumin: 2.5 g/dL — ABNORMAL LOW (ref 3.5–5.0)
BILIRUBIN DIRECT: 0.3 mg/dL (ref 0.1–0.5)
BILIRUBIN INDIRECT: 0.2 mg/dL — AB (ref 0.3–0.9)
TOTAL PROTEIN: 6.1 g/dL — AB (ref 6.5–8.1)
Total Bilirubin: 0.5 mg/dL (ref 0.3–1.2)

## 2015-09-23 LAB — URINE MICROSCOPIC-ADD ON

## 2015-09-23 LAB — I-STAT TROPONIN, ED: TROPONIN I, POC: 0.01 ng/mL (ref 0.00–0.08)

## 2015-09-23 LAB — MAGNESIUM: MAGNESIUM: 2 mg/dL (ref 1.7–2.4)

## 2015-09-23 LAB — LIPASE, BLOOD: Lipase: 108 U/L — ABNORMAL HIGH (ref 11–51)

## 2015-09-23 MED ORDER — OXYCODONE HCL 5 MG PO TABS
5.0000 mg | ORAL_TABLET | ORAL | Status: DC | PRN
  Administered 2015-09-24: 5 mg via ORAL
  Filled 2015-09-23: qty 1

## 2015-09-23 MED ORDER — POTASSIUM CHLORIDE 10 MEQ/100ML IV SOLN
10.0000 meq | INTRAVENOUS | Status: AC
Start: 2015-09-23 — End: 2015-09-23
  Administered 2015-09-23 (×4): 10 meq via INTRAVENOUS
  Filled 2015-09-23 (×3): qty 100

## 2015-09-23 MED ORDER — SENNOSIDES-DOCUSATE SODIUM 8.6-50 MG PO TABS: 1.0000 | ORAL_TABLET | Freq: Every evening | ORAL | Status: DC | PRN

## 2015-09-23 MED ORDER — FLUTICASONE FUROATE-VILANTEROL 100-25 MCG/INH IN AEPB
1.0000 | INHALATION_SPRAY | Freq: Every day | RESPIRATORY_TRACT | Status: DC
  Administered 2015-09-24 – 2015-09-26 (×3): 1 via RESPIRATORY_TRACT
  Filled 2015-09-23: qty 28

## 2015-09-23 MED ORDER — SODIUM CHLORIDE 0.9% FLUSH: 3.0000 mL | Freq: Two times a day (BID) | INTRAVENOUS | Status: DC

## 2015-09-23 MED ORDER — MOMETASONE FURO-FORMOTEROL FUM 200-5 MCG/ACT IN AERO: 2.0000 | INHALATION_SPRAY | Freq: Two times a day (BID) | RESPIRATORY_TRACT | Status: DC | PRN

## 2015-09-23 MED ORDER — METOPROLOL SUCCINATE ER 50 MG PO TB24
50.0000 mg | ORAL_TABLET | Freq: Every day | ORAL | Status: DC
  Administered 2015-09-24 – 2015-09-27 (×4): 50 mg via ORAL
  Filled 2015-09-23 (×4): qty 1

## 2015-09-23 MED ORDER — ONDANSETRON HCL 4 MG PO TABS: 4.0000 mg | ORAL_TABLET | Freq: Four times a day (QID) | ORAL | Status: DC | PRN

## 2015-09-23 MED ORDER — HYDROMORPHONE HCL 1 MG/ML IJ SOLN
0.5000 mg | INTRAMUSCULAR | Status: DC | PRN
  Administered 2015-09-24 – 2015-09-26 (×4): 0.5 mg via INTRAVENOUS
  Filled 2015-09-23 (×4): qty 1

## 2015-09-23 MED ORDER — DIAZEPAM 5 MG PO TABS: 2.5000 mg | ORAL_TABLET | Freq: Two times a day (BID) | ORAL | Status: DC | PRN

## 2015-09-23 MED ORDER — METHYLPREDNISOLONE 4 MG PO TABS
4.0000 mg | ORAL_TABLET | Freq: Once | ORAL | Status: AC
  Administered 2015-09-24: 4 mg via ORAL
  Filled 2015-09-23: qty 1

## 2015-09-23 MED ORDER — METOPROLOL SUCCINATE ER 25 MG PO TB24
25.0000 mg | ORAL_TABLET | Freq: Every day | ORAL | Status: DC
  Administered 2015-09-23 – 2015-09-26 (×4): 25 mg via ORAL
  Filled 2015-09-23 (×4): qty 1

## 2015-09-23 MED ORDER — PANTOPRAZOLE SODIUM 40 MG PO TBEC
40.0000 mg | DELAYED_RELEASE_TABLET | Freq: Every day | ORAL | Status: DC
  Administered 2015-09-23 – 2015-09-24 (×2): 40 mg via ORAL
  Filled 2015-09-23 (×3): qty 1

## 2015-09-23 MED ORDER — POTASSIUM CHLORIDE IN NACL 20-0.9 MEQ/L-% IV SOLN
INTRAVENOUS | Status: DC
Start: 2015-09-23 — End: 2015-09-25
  Administered 2015-09-23: 125 mL via INTRAVENOUS
  Administered 2015-09-24 – 2015-09-25 (×4): via INTRAVENOUS
  Filled 2015-09-23 (×6): qty 1000

## 2015-09-23 MED ORDER — ACETAMINOPHEN 500 MG PO TABS
500.0000 mg | ORAL_TABLET | Freq: Three times a day (TID) | ORAL | Status: DC | PRN
  Administered 2015-09-26 (×2): 1000 mg via ORAL
  Filled 2015-09-23 (×2): qty 2

## 2015-09-23 MED ORDER — IOPAMIDOL (ISOVUE-300) INJECTION 61%
100.0000 mL | Freq: Once | INTRAVENOUS | Status: AC | PRN
  Administered 2015-09-23: 100 mL via INTRAVENOUS

## 2015-09-23 MED ORDER — SULFAMETHOXAZOLE-TRIMETHOPRIM 800-160 MG PO TABS
1.0000 | ORAL_TABLET | ORAL | Status: AC
  Administered 2015-09-24: 1 via ORAL
  Filled 2015-09-23: qty 1

## 2015-09-23 MED ORDER — SODIUM CHLORIDE 0.9 % IV BOLUS (SEPSIS)
2000.0000 mL | Freq: Once | INTRAVENOUS | Status: AC
  Administered 2015-09-23: 2000 mL via INTRAVENOUS

## 2015-09-23 MED ORDER — CLOTRIMAZOLE 1 % VA CREA
1.0000 | TOPICAL_CREAM | Freq: Every day | VAGINAL | Status: DC
  Administered 2015-09-23 – 2015-09-25 (×3): 1 via VAGINAL
  Filled 2015-09-23: qty 45

## 2015-09-23 MED ORDER — MELATONIN 1 MG PO TABS: 1.0000 mg | ORAL_TABLET | Freq: Every day | ORAL | Status: DC

## 2015-09-23 MED ORDER — ONDANSETRON HCL 4 MG/2ML IJ SOLN
4.0000 mg | Freq: Four times a day (QID) | INTRAMUSCULAR | Status: DC | PRN
  Administered 2015-09-24: 4 mg via INTRAVENOUS
  Filled 2015-09-23: qty 2

## 2015-09-23 NOTE — H&P (Signed)
History and Physical  Patient Name: [name] [name]     [id number]    DOB: 04-03-1945    DOA: 09/23/2015 PCP: [name], MD   Patient coming from: Home  Chief Complaint: Abdominal pain  HPI: [name] [name] is a 70 y.o. 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 [name] Rheum, HTN, COPD and hemochromatosis who presents with epigastric pain.  The patient developed some urinary symptoms and hematuria earlier this spring for which she was evaluated by Urology with a CT abdomen that incidentally showed a RLL cavitary mass, referred to Dr. Larkin Ina of [place].  Repeat CT showed this cavitary mass that was biopsied and actually showed vasculitis, P-Anca positive, diagnosed as GPA/Wegener's.  She was initially started on high dose oral steroids in April, slowly tapered over the last month (due to stop tomorrow), and has gotten weekly rituximab with Dr. [name] of Rheumatology this month (last infusion is due tomorrow).  However, in the interim, she has developed sharp epigastric pain radiating to the upper back.  This is constant but severe at times, worse with eating and associated with decreased appetite, nausea and intermittent vomiting.  In the last week she has had some chills, and now in the last 2-3 days the pain is worse and she is tired all over.  ED course: -Afebrile, tachycardic, BP and O2 saturation normal -Na 136, K 2.8, Cr 0.75 (baseline), WBC 13.4K, Hgb 13.9 -Lactate 2.73 and lipase 108 (2x ULN) -CT of the abdomen and pelvis with IV contrast showed pancreatic stranding and a 4 cm pseudocyst  She was given fluids and IV potassium and TRH were asked to evaluate for admission.     ROS: Pt complains of epigastric pain, NBNB emesis, nausea, decreased appetite, chills.  Pt denies any fever, syncope, confusion.    All other systems negative except as just noted or noted in the history of present illness.    Past Medical History    Diagnosis Date  . Hereditary hemochromatosis ([place]) 2004  . ASTHMA   . HYPERTENSION   . GERD   . Chronic kidney disease   . Arthritis     left shoulder   . Headache(784.0)     sinus headaches   . H/O hiatal hernia   . Anxiety   . Hyperlipidemia   . CAD (coronary artery disease)   . Depression   . Hypertension   . Urinary tract infection   . COPD (chronic obstructive pulmonary disease) with emphysema ([place])     mild dz on PFTs 11/2013  . 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 Hx of adenomatous polyp of colon 07/25/2014  . Osteopenia 10/07/2014    DEXA @ [facility] 09/2014: -1.9 L fem  . Other mixed anxiety disorders 06/16/2015  . Granulomatosis with polyangiitis (Wegener's)     Past Surgical History  Procedure Laterality Date  . Cholecystectomy  03/29/09  . Appendectomy  03/29/81  . Abdominal hysterectomy  03/29/81  . Bladder tack  03/29/2002  . Other surgical history      cyst removed from intestines to ovary    . Other surgical history      tendon surgery left wrist   . Cystoscopy  01/24/14    NEGATIVE;Dr Ottelin  . Colonoscopy w/ biopsies    . Esophagogastroduodenoscopy      Social History: Patient lives alone.  The patient walks unassisted  and still drives.  She smokes.  She is from [place].    Allergies  Allergen Reactions  . Other Other (See Comments)    Please if giving pt anesthesia - she would like to have a scopoline patch to prevent nausea   . Ciprofloxacin Hives and Other (See Comments)    Blisters on legs  . Codeine Nausea And Vomiting and Other (See Comments)    Sweating, "passes out"  . Macrobid [employer] Macro] Other (See Comments)    Stool incontinence  . Morphine And Related Other (See Comments)    "hospital bed was shaking"  . Prevnar [Pneumococcal 13-Val Conj Vacc] Palpitations and Other (See Comments)    Pt c/o redness, swelling on arm after shot.  Pt c/o  having respiratory problems and coughing ever since she got it.     Family history: family history includes Alcohol abuse in her other; Arthritis in her other; Breast cancer (age of onset: 71) in her mother; Cirrhosis in her brother, brother, and brother; Diabetes in her father; Heart disease in her father and mother; Hypertension in her mother; Lung cancer in her brother. There is no history of Colon cancer, Colon polyps, Esophageal cancer, Rectal cancer, or Stomach cancer.  Prior to Admission medications   Medication Sig Start Date End Date Taking? Authorizing Provider  acetaminophen (TYLENOL) 500 MG tablet Take 500-1,000 mg by mouth every 8 (eight) hours as needed for mild pain or moderate pain.   Yes Historical Provider, MD  albuterol (PROVENTIL) (2.5 MG/3ML) 0.083% nebulizer solution Take 3 mLs (2.5 mg total) by nebulization every 6 (six) hours as needed for wheezing or shortness of breath. 05/28/14  Yes Hendricks Limes, MD  aspirin EC 81 MG tablet Take 81 mg by mouth daily. Reported on 06/25/2015 08/04/11  Yes Lelon Perla, MD  budesonide-formoterol [facility]) 160-4.5 MCG/ACT inhaler Inhale 2 puffs into the lungs 2 (two) times daily. Patient taking differently: Inhale 2 puffs into the lungs 2 (two) times daily as needed (For shortness of breath.).  07/30/15  Yes Jose Shirl Harris, MD  diazepam (VALIUM) 5 MG tablet Take 0.5-1 tablets (2.5-5 mg total) by mouth every 12 (twelve) hours as needed for anxiety. 08/08/15  Yes Biagio Borg, MD  docusate sodium (COLACE) 100 MG capsule Take 100 mg by mouth daily as needed for mild constipation.   Yes Historical Provider, MD  fexofenadine (ALLEGRA) 180 MG tablet Take 180 mg by mouth at bedtime.    Yes Historical Provider, MD  fluticasone furoate-vilanterol (BREO ELLIPTA) 100-25 MCG/INH AEPB Inhale 1 puff into the lungs daily. 07/30/15  Yes [place], MD  Melatonin 1 MG TABS Take 1 mg by mouth at bedtime.   Yes Historical Provider, MD    methylPREDNISolone (MEDROL) 4 MG tablet Take 4-12 mg by mouth See admin instructions. Take three tablets (12mg ) on Day 1, two tablets (8mg ) on Day 2 and one tablet (4mg ) on Day 3. Started 09/22/15.   Yes Historical Provider, MD  metoprolol succinate (TOPROL XL) 25 MG 24 hr tablet Take 1 tablet (25 mg total) by mouth at bedtime. 09/19/15  Yes Burtis Junes, NP  metoprolol succinate (TOPROL-XL) 50 MG 24 hr tablet Take 1 tablet (50 mg total) by mouth daily. Take with or immediately following a meal. 08/28/15  Yes Rhonda G Barrett, PA-C  omeprazole (PRILOSEC) 20 MG capsule Take 1 capsule (20 mg total) by mouth daily. Patient taking differently: Take 20 mg by mouth 2 (  two) times daily before a meal.  06/04/15  Yes [name], MD  riTUXimab in sodium chloride 0.9 % 250 mL Inject into the vein See admin instructions. She is receiving 4 doses with Dr. Gavin Pound at [facility]. Her next dose is due on 09/24/15.   Yes Historical Provider, MD  sucralfate (CARAFATE) 1 g tablet Take 1 tablet (1 g total) by mouth 4 (four) times daily. 07/30/15  Yes [place], MD  sulfamethoxazole-trimethoprim (BACTRIM DS,SEPTRA DS) 800-160 MG tablet Take 1 tablet by mouth every Monday, Wednesday, and Friday.   Yes Historical Provider, MD       Physical Exam: BP 112/98 mmHg  Pulse 96  Temp(Src) 97.9 F (36.6 C) (Oral)  Resp 23  Ht 5\' 3"  (1.6 m)  Wt 66.225 kg (146 lb)  BMI 25.87 kg/m2  SpO2 97% General appearance: Well-developed, obese female, alert and in moderate distress from pain.   Eyes: Conjunctiva normal, lids and lashes normal.   PERRL.  ENT: No nasal deformity, discharge.  OP moist without lesions.   Lymph: No cervical or supraclavicular lymphadenopathy. Skin: Warm and dry.  No suspicious rashes or lesions. Cardiac: Tachycardic, regular, nl S1-S2, no murmurs appreciated.  Capillary refill is brisk.  JVP not visible.  No LE edema.  Radial and DP pulses 2+ and symmetric. Respiratory:  Normal respiratory rate and rhythm.  CTAB without rales or wheezes. GI: Abdomen soft without rigidity.  Moderate epigastric TTP without guarding. No ascites, distension, hepatosplenomegaly.   MSK: No deformities or effusions.  No clubbing/cyanosis. Neuro: Cranial nerves nromal.  Sensorium intact and responding to questions, attention normal.  Speech is fluent.  Moves all extremities equally and with normal coordination.    Psych: Affect anxious.  Judgment and insight appear normal.       Labs on Admission:  I have personally reviewed following labs and imaging studies: CBC:  Recent Labs Lab 09/23/15 0855 09/23/15 0904  WBC 13.4*  --   NEUTROABS 11.9*  --   HGB 13.9 13.3  HCT 37.6 39.0  MCV 91.3  --   PLT 170  --    Basic Metabolic Panel:  Recent Labs Lab 09/23/15 0855 09/23/15 0904  NA 136 134*  K 3.1* 2.8*  CL 100* 99*  CO2 26  --   GLUCOSE 214* 216*  BUN 13 10  CREATININE 0.75 0.80  CALCIUM 8.8*  --    GFR: Estimated Creatinine Clearance: 60.7 mL/min (by C-G formula based on Cr of 0.8).  Liver Function Tests:  Recent Labs Lab 09/23/15 0855  AST 28  ALT 59*  ALKPHOS 93  BILITOT 1.0  PROT 6.2*  ALBUMIN 2.8*    Recent Labs Lab 09/23/15 0855  LIPASE 108*          Radiological Exams on Admission: Personally reviewed: Ct Abdomen Pelvis W Contrast  09/23/2015  CLINICAL DATA:  Lower abdominal pain radiating to epigastric area in mid back for 6 weeks. Nausea. EXAM: CT ABDOMEN AND PELVIS WITH CONTRAST TECHNIQUE: Multidetector CT imaging of the abdomen and pelvis was performed using the standard protocol following bolus administration of intravenous contrast. CONTRAST:  189mL ISOVUE-300 IOPAMIDOL (ISOVUE-300) INJECTION 61% COMPARISON:  No CT 06/10/2015.  PET CT 07/07/2015.  Ne. FINDINGS: Lower chest: 2.8 cm partially cavitary mass noted in the right lower lobe. An adjacent small right pleural effusion, new since prior studies. Heart is normal size.  Hepatobiliary: Stable hypodensities in the liver, likely cysts. Prior cholecystectomy. No biliary ductal  dilatation. Pancreas: Stranding noted around the pancreas with small fluid collections, compatible with acute pancreatitis and early formation of pseudocysts. The largest is in the region of the pancreatic body measuring up to 4.1 cm. Fluid and developing focal fluid collections extends into the left anterior para renal space. No focal pancreatic abnormality. The pancreatic duct is mildly prominent, new since prior study. Spleen: No focal abnormality.  Normal size. Adrenals/Urinary Tract: No adrenal abnormality. No focal renal abnormality. No stones or hydronephrosis. Urinary bladder is unremarkable. Stomach/Bowel: Stomach, large and small bowel grossly unremarkable. Vascular/Lymphatic: Diffuse aortic and iliac calcifications. Mild ectasia of the distal abdominal aorta measuring 2.3 cm. No aneurysm. No adenopathy. Reproductive: Prior hysterectomy.  No adnexal masses. Other: No free fluid or free air. Small bilateral inguinal hernias containing fat. Musculoskeletal: No acute bony abnormality or focal bone lesion. IMPRESSION: Right lower lobe cavitary lesion again noted. There is a new small right pleural effusion. Fluid an developing fluid collections around the pancreas compatible with acute pancreatitis and developing pseudocysts. Aortic atherosclerosis. Benign-appearing cysts in the liver. Electronically Signed   By: Rolm Baptise M.D.   On: 09/23/2015 10:02   Dg Chest Port 1 View  09/23/2015  CLINICAL DATA:  Shortness of breath with fatigue and weakness EXAM: PORTABLE CHEST 1 VIEW COMPARISON:  July 17, 2015 chest radiograph and chest CT June 26, 2015 FINDINGS: The ill-defined mass in the right lower lobe is slightly less well seen compared to recent prior study. In particular, the cavitation in this area is not appreciable on current radiographic examination. There is scarring in the right base. There is  new slight left base atelectasis laterally. Elsewhere lungs are clear. Heart size and pulmonary vascularity are normal. No adenopathy. No bone lesions. IMPRESSION: Previously noted mass in the right lower lobe is less well seen compared to prior studies. There is scarring in the right base. There is new atelectasis in the lateral left base. Lungs elsewhere clear. Stable cardiac silhouette. Electronically Signed   By: Lowella Grip III M.D.   On: 09/23/2015 09:09        Assessment/Plan 1. Pancreatitis with pseudocyst:  6 weeks of pain with eating, now with pseudocyst.  Hemochromatosis with normal ferritin, followed by Dr. [name].    The pseudocyst was discussed with GI who see no reason to pursue drainage, unless cyst became obviously infected/febrile/WBC climbing. -NPO  -MIVF -Acetaminiophen for pain -Add on LFT -Check lipids tomorrow -Hydromorphone low dose available, patient very reluctant about opioid pain medication -Ondansetron PRN for nausea     2. Granulomatosis with polyangiitis (Wegen):  Followed by Dr. [name] at [facility]. Last Rituxan infusion tomorrow.  I spoke with RN at [facility] will call to reschedule Rituxan near discharge. -Finish Medrol with 4 mg tomorrow -Finish Bactrim (while on steroids) tomorrow last dose  3. Hypokalemia:  Supplemented in ER.   -Check magnesium  4. HTN and hx nonobs CAD:  -Continue metoprolol and aspirin  5. Hx of emphysema:  -Continue Dulera inhaler PRN and Breo daily -Smoking cessation recommended  6. Depression/anxiety:  -Continue melatonin QHS -Continue diazepam PRN  7. Smoking:  -Cessation counseling  8. Hyperglycemia: From prednisone -Daily CBG and SSI only if needed  9. Leukocytosis: In setting of steroids  10. Tachycardia: Likely from pain, but as high as 120 without ECG confirmation.  Will observe on tele overnight for arrhythmia.  D/c if sinus.    DVT prophylaxis: SCDs for now  Code Status: FULL   Family Communication:  Daughters at bedside  Disposition Plan: Anticipate conservative treatment of pancreatitis, with MIVF, pain medicaiton and fluids.  Close monitoring of electrolytes. Consults called: GI, discussed by phone Admission status: INPATIENT, telemetry for now   Medical decision making: Patient seen at 10:59 AM on 09/23/2015.  The patient was discussed with Dr. Ralene Bathe and [facility] by phone. What exists of the patient's chart was reviewed in depth.  Clinical condition: stable.        Edwin Dada [facility] Pager [phone]

## 2015-09-23 NOTE — ED Notes (Signed)
X-ray at bedside

## 2015-09-23 NOTE — ED Notes (Addendum)
Patient presents for lower abdominal pain radiating to epigastric area and mid back x6 weeks and nausea. Denies emesis, diarrhea, or urinary symptoms.

## 2015-09-23 NOTE — ED Notes (Signed)
Pt's visitors reports that the Pt was recently diagnosed w/ Wegener's.

## 2015-09-23 NOTE — Plan of Care (Signed)
Problem: Safety: Goal: Ability to remain free from injury will improve Outcome: Completed/Met Date Met:  09/23/15 Patient is ambulatory with stand by assist. Pt is aware to call when needing to get out bed. Pt agreeable to safety plan

## 2015-09-23 NOTE — Progress Notes (Signed)
PHARMACIST - PHYSICIAN ORDER COMMUNICATION  CONCERNING: P&T Medication Policy on Herbal Medications  DESCRIPTION:  This patient's order for:  melatonin  has been noted.  This product(s) is classified as an "herbal" or natural product. Due to a lack of definitive safety studies or FDA approval, nonstandard manufacturing practices, plus the potential risk of unknown drug-drug interactions while on inpatient medications, the Pharmacy and Therapeutics Committee does not permit the use of "herbal" or natural products of this type within [facility]   ACTION TAKEN: The pharmacy department is unable to verify this order at this time and the order has been discontinued. Please reevaluate patient's clinical condition at discharge and address if the herbal or natural product(s) should be resumed at that time.  Royetta Asal, PharmD, BCPS Pager [phone] 09/23/2015 12:12 PM

## 2015-09-23 NOTE — ED Provider Notes (Signed)
CSN: [id number]     Arrival date & time 09/23/15  0809 History   First MD Initiated Contact with Patient 09/23/15 [phone]     Chief Complaint  Patient presents with  . Abdominal Pain    Patient is a 70 y.o. female presenting with abdominal pain. The history is provided by the patient. No language interpreter was used.  Abdominal Pain  [name] [name] is a 70 y.o. female who presents to the Emergency Department complaining of abdominal pain.  She reports 6 weeks of epigastric abdominal pain radiating around to her back. The pain has worsened over the last 5 days. She reports associated pain with deep breaths. She has nausea and decreased appetite. No vomiting. She had constipation but had diarrhea a few days ago. No dysuria. No hematochezia or melena. She has a history of Wegener's and has been on steroids since April her steroids have begun a taper over the last few days. She denies any chest pain or shortness of breath. No fevers.  Past Medical History  Diagnosis Date  . Hereditary hemochromatosis ([place]) 2004  . ASTHMA   . HYPERTENSION   . GERD   . Chronic kidney disease   . Arthritis     left shoulder   . Headache(784.0)     sinus headaches   . H/O hiatal hernia   . Anxiety   . Hyperlipidemia   . CAD (coronary artery disease)   . Depression   . Hypertension   . Urinary tract infection   . COPD (chronic obstructive pulmonary disease) with emphysema ([place])     mild dz on PFTs 11/2013  . 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 Hx of adenomatous polyp of colon 07/25/2014  . Osteopenia 10/07/2014    DEXA @ [facility] 09/2014: -1.9 L fem  . Other mixed anxiety disorders 06/16/2015  . 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 PONV (postoperative nausea and vomiting)    Past Surgical History  Procedure Laterality Date  . Cholecystectomy  03/29/09  . Appendectomy  03/29/81  . Abdominal hysterectomy  03/29/81  .  Bladder tack  03/29/2002  . Other surgical history      cyst removed from intestines to ovary    . Other surgical history      tendon surgery left wrist   . Cystoscopy  01/24/14    NEGATIVE;Dr Ottelin  . Colonoscopy w/ biopsies    . Esophagogastroduodenoscopy     Family History  Problem Relation Age of Onset  . Hypertension Mother   . Heart disease Mother   . Breast cancer Mother 69  . Diabetes Father   . Heart disease Father   . Alcohol abuse Other   . Arthritis Other   . Cirrhosis Brother   . Cirrhosis Brother   . Cirrhosis Brother   . Lung cancer Brother   . Colon cancer Neg Hx   . Colon polyps Neg Hx   . Esophageal cancer Neg Hx   . Rectal cancer Neg Hx   . Stomach cancer Neg Hx    Social History  Substance Use Topics  . Smoking status: Current Every Day Smoker -- 0.33 packs/day for 50 years    Types: Cigarettes    Last Attempt to Quit: 04/30/2014  . Smokeless tobacco: Never Used  . Alcohol Use: No   OB History    No data available     Review of Systems  Gastrointestinal: Positive for  abdominal pain.  All other systems reviewed and are negative.     Allergies  Other; Ciprofloxacin; Codeine; Macrobid; Morphine and related; and Prevnar  Home Medications   Prior to Admission medications   Medication Sig Start Date End Date Taking? Authorizing Provider  acetaminophen (TYLENOL) 500 MG tablet Take 500-1,000 mg by mouth every 8 (eight) hours as needed for mild pain or moderate pain.   Yes Historical Provider, MD  albuterol (PROVENTIL) (2.5 MG/3ML) 0.083% nebulizer solution Take 3 mLs (2.5 mg total) by nebulization every 6 (six) hours as needed for wheezing or shortness of breath. 05/28/14  Yes Hendricks Limes, MD  aspirin EC 81 MG tablet Take 81 mg by mouth daily. Reported on 06/25/2015 08/04/11  Yes Lelon Perla, MD  budesonide-formoterol [facility]) 160-4.5 MCG/ACT inhaler Inhale 2 puffs into the lungs 2 (two) times daily. Patient taking differently: Inhale 2  puffs into the lungs 2 (two) times daily as needed (For shortness of breath.).  07/30/15  Yes Jose Shirl Harris, MD  diazepam (VALIUM) 5 MG tablet Take 0.5-1 tablets (2.5-5 mg total) by mouth every 12 (twelve) hours as needed for anxiety. 08/08/15  Yes Biagio Borg, MD  docusate sodium (COLACE) 100 MG capsule Take 100 mg by mouth daily as needed for mild constipation.   Yes Historical Provider, MD  fexofenadine (ALLEGRA) 180 MG tablet Take 180 mg by mouth at bedtime.    Yes Historical Provider, MD  fluticasone furoate-vilanterol (BREO ELLIPTA) 100-25 MCG/INH AEPB Inhale 1 puff into the lungs daily. 07/30/15  Yes [place], MD  Melatonin 1 MG TABS Take 1 mg by mouth at bedtime.   Yes Historical Provider, MD  methylPREDNISolone (MEDROL) 4 MG tablet Take 4-12 mg by mouth See admin instructions. Take three tablets (12mg ) on Day 1, two tablets (8mg ) on Day 2 and one tablet (4mg ) on Day 3. Started 09/22/15.   Yes Historical Provider, MD  metoprolol succinate (TOPROL XL) 25 MG 24 hr tablet Take 1 tablet (25 mg total) by mouth at bedtime. 09/19/15  Yes Burtis Junes, NP  metoprolol succinate (TOPROL-XL) 50 MG 24 hr tablet Take 1 tablet (50 mg total) by mouth daily. Take with or immediately following a meal. 08/28/15  Yes Rhonda G Barrett, PA-C  omeprazole (PRILOSEC) 20 MG capsule Take 1 capsule (20 mg total) by mouth daily. Patient taking differently: Take 20 mg by mouth 2 (two) times daily before a meal.  06/04/15  Yes [name], MD  riTUXimab in sodium chloride 0.9 % 250 mL Inject into the vein See admin instructions. She is receiving 4 doses with Dr. Gavin Pound at [facility]. Her next dose is due on 09/24/15.   Yes Historical Provider, MD  sucralfate (CARAFATE) 1 g tablet Take 1 tablet (1 g total) by mouth 4 (four) times daily. 07/30/15  Yes [place], MD  sulfamethoxazole-trimethoprim (BACTRIM DS,SEPTRA DS) 800-160 MG tablet Take 1 tablet by mouth every Monday, Wednesday,  and Friday.   Yes Historical Provider, MD   BP 118/64 mmHg  Pulse 89  Temp(Src) 99 F (37.2 C) (Oral)  Resp 26  Ht 5\' 3"  (1.6 m)  Wt 146 lb (66.225 kg)  BMI 25.87 kg/m2  SpO2 93% Physical Exam  Constitutional: She is oriented to person, place, and time. She appears well-developed and well-nourished. She appears distressed.  Ill-appearing  HENT:  Head: Normocephalic and atraumatic.  Cardiovascular: Regular rhythm.   No murmur heard. Tachycardiac  Pulmonary/Chest: Effort normal. No respiratory distress.  Decreased air movement bilateral bases  Abdominal: Soft.  Moderate to severe diffuse abdominal tenderness with voluntary guarding  Musculoskeletal: She exhibits no edema or tenderness.  Neurological: She is alert and oriented to person, place, and time.  Skin: Skin is warm and dry.  Mild abdominal wall with delayed cap refill  Psychiatric: She has a normal mood and affect. Her behavior is normal.  Nursing note and vitals reviewed.   ED Course  Procedures (including critical care time) Labs Review Labs Reviewed  COMPREHENSIVE METABOLIC PANEL - Abnormal; Notable for the following:    Potassium 3.1 (*)    Chloride 100 (*)    Glucose, Bld 214 (*)    Calcium 8.8 (*)    Total Protein 6.2 (*)    Albumin 2.8 (*)    ALT 59 (*)    All other components within normal limits  LIPASE, BLOOD - Abnormal; Notable for the following:    Lipase 108 (*)    All other components within normal limits  CBC WITH DIFFERENTIAL/PLATELET - Abnormal; Notable for the following:    WBC 13.4 (*)    MCHC 37.0 (*)    Neutro Abs 11.9 (*)    All other components within normal limits  URINALYSIS, ROUTINE W REFLEX MICROSCOPIC (NOT AT [facility]) - Abnormal; Notable for the following:    Hgb urine dipstick TRACE (*)    Leukocytes, UA SMALL (*)    All other components within normal limits  URINE MICROSCOPIC-ADD ON - Abnormal; Notable for the following:    Squamous Epithelial / LPF 0-5 (*)    Bacteria, UA  RARE (*)    All other components within normal limits  HEPATIC FUNCTION PANEL - Abnormal; Notable for the following:    Total Protein 6.1 (*)    Albumin 2.5 (*)    ALT 62 (*)    Indirect Bilirubin 0.2 (*)    All other components within normal limits  I-STAT CHEM 8, ED - Abnormal; Notable for the following:    Sodium 134 (*)    Potassium 2.8 (*)    Chloride 99 (*)    Glucose, Bld 216 (*)    Calcium, Ion 1.10 (*)    All other components within normal limits  I-STAT CG4 LACTIC ACID, ED - Abnormal; Notable for the following:    Lactic Acid, Venous 2.73 (*)    All other components within normal limits  MAGNESIUM  I-STAT TROPOININ, ED    Imaging Review Ct Abdomen Pelvis W Contrast  09/23/2015  CLINICAL DATA:  Lower abdominal pain radiating to epigastric area in mid back for 6 weeks. Nausea. EXAM: CT ABDOMEN AND PELVIS WITH CONTRAST TECHNIQUE: Multidetector CT imaging of the abdomen and pelvis was performed using the standard protocol following bolus administration of intravenous contrast. CONTRAST:  187mL ISOVUE-300 IOPAMIDOL (ISOVUE-300) INJECTION 61% COMPARISON:  No CT 06/10/2015.  PET CT 07/07/2015.  Ne. FINDINGS: Lower chest: 2.8 cm partially cavitary mass noted in the right lower lobe. An adjacent small right pleural effusion, new since prior studies. Heart is normal size. Hepatobiliary: Stable hypodensities in the liver, likely cysts. Prior cholecystectomy. No biliary ductal dilatation. Pancreas: Stranding noted around the pancreas with small fluid collections, compatible with acute pancreatitis and early formation of pseudocysts. The largest is in the region of the pancreatic body measuring up to 4.1 cm. Fluid and developing focal fluid collections extends into the left anterior para renal space. No focal pancreatic abnormality. The pancreatic duct is  mildly prominent, new since prior study. Spleen: No focal abnormality.  Normal size. Adrenals/Urinary Tract: No adrenal abnormality. No focal  renal abnormality. No stones or hydronephrosis. Urinary bladder is unremarkable. Stomach/Bowel: Stomach, large and small bowel grossly unremarkable. Vascular/Lymphatic: Diffuse aortic and iliac calcifications. Mild ectasia of the distal abdominal aorta measuring 2.3 cm. No aneurysm. No adenopathy. Reproductive: Prior hysterectomy.  No adnexal masses. Other: No free fluid or free air. Small bilateral inguinal hernias containing fat. Musculoskeletal: No acute bony abnormality or focal bone lesion. IMPRESSION: Right lower lobe cavitary lesion again noted. There is a new small right pleural effusion. Fluid an developing fluid collections around the pancreas compatible with acute pancreatitis and developing pseudocysts. Aortic atherosclerosis. Benign-appearing cysts in the liver. Electronically Signed   By: Rolm Baptise M.D.   On: 09/23/2015 10:02   Dg Chest Port 1 View  09/23/2015  CLINICAL DATA:  Shortness of breath with fatigue and weakness EXAM: PORTABLE CHEST 1 VIEW COMPARISON:  July 17, 2015 chest radiograph and chest CT June 26, 2015 FINDINGS: The ill-defined mass in the right lower lobe is slightly less well seen compared to recent prior study. In particular, the cavitation in this area is not appreciable on current radiographic examination. There is scarring in the right base. There is new slight left base atelectasis laterally. Elsewhere lungs are clear. Heart size and pulmonary vascularity are normal. No adenopathy. No bone lesions. IMPRESSION: Previously noted mass in the right lower lobe is less well seen compared to prior studies. There is scarring in the right base. There is new atelectasis in the lateral left base. Lungs elsewhere clear. Stable cardiac silhouette. Electronically Signed   By: Lowella Grip III M.D.   On: 09/23/2015 09:09   I have personally reviewed and evaluated these images and lab results as part of my medical decision-making.   EKG Interpretation None      MDM    Final diagnoses:  Acute pancreatitis, unspecified complication status, unspecified pancreatitis type  Hypokalemia    Patient here for evaluation of abdominal pain that's been persistent for the last 6 weeks, recently worsening. She has acute pancreatitis based on imaging, labs. She is hypokalemic, this was replaced with KCl.  She was given IV fluids for dehydration. Plan to admit for further treatment. D/w pt recommendation for admission for further treatment and patient is in agreement with plan.     Quintella Reichert, MD 09/23/15 [phone]

## 2015-09-23 NOTE — ED Notes (Addendum)
Notified Dr. Ralene Bathe of Lactic 2.73

## 2015-09-24 LAB — COMPREHENSIVE METABOLIC PANEL
ALK PHOS: 73 U/L (ref 38–126)
ALT: 46 U/L (ref 14–54)
AST: 20 U/L (ref 15–41)
Albumin: 2.2 g/dL — ABNORMAL LOW (ref 3.5–5.0)
Anion gap: 6 (ref 5–15)
BUN: 8 mg/dL (ref 6–20)
CALCIUM: 8 mg/dL — AB (ref 8.9–10.3)
CHLORIDE: 107 mmol/L (ref 101–111)
CO2: 23 mmol/L (ref 22–32)
CREATININE: 0.6 mg/dL (ref 0.44–1.00)
GFR calc Af Amer: >60 mL/min (ref >60)
Glucose, Bld: 95 mg/dL (ref 65–99)
Potassium: 3.6 mmol/L (ref 3.5–5.1)
Sodium: 136 mmol/L (ref 135–145)
Total Bilirubin: 0.8 mg/dL (ref 0.3–1.2)
Total Protein: 4.7 g/dL — ABNORMAL LOW (ref 6.5–8.1)

## 2015-09-24 LAB — CBC
HCT: 31.7 % — ABNORMAL LOW (ref 36.0–46.0)
Hemoglobin: 11.1 g/dL — ABNORMAL LOW (ref 12.0–15.0)
MCH: 32.5 pg (ref 26.0–34.0)
MCHC: 35 g/dL (ref 30.0–36.0)
MCV: 92.7 fL (ref 78.0–100.0)
PLATELETS: 127 10*3/uL — AB (ref 150–400)
RBC: 3.42 MIL/uL — ABNORMAL LOW (ref 3.87–5.11)
RDW: 14.2 % (ref 11.5–15.5)
WBC: 9.3 10*3/uL (ref 4.0–10.5)

## 2015-09-24 LAB — LIPID PANEL
Cholesterol: 136 mg/dL (ref 0–200)
HDL: 21 mg/dL — ABNORMAL LOW (ref >40)
LDL Cholesterol: 69 mg/dL (ref 0–99)
Total CHOL/HDL Ratio: 6.5 RATIO
Triglycerides: 229 mg/dL — ABNORMAL HIGH (ref <150)
VLDL: 46 mg/dL — ABNORMAL HIGH (ref 0–40)

## 2015-09-24 LAB — GLUCOSE, CAPILLARY: GLUCOSE-CAPILLARY: 99 mg/dL (ref 65–99)

## 2015-09-24 MED ORDER — FENTANYL CITRATE (PF) 100 MCG/2ML IJ SOLN
25.0000 ug | INTRAMUSCULAR | Status: DC | PRN
  Administered 2015-09-24 (×2): 25 ug via INTRAVENOUS
  Filled 2015-09-24 (×2): qty 2

## 2015-09-24 MED ORDER — BUDESONIDE 0.25 MG/2ML IN SUSP
0.2500 mg | Freq: Two times a day (BID) | RESPIRATORY_TRACT | Status: DC
  Administered 2015-09-24 – 2015-09-26 (×5): 0.25 mg via RESPIRATORY_TRACT
  Filled 2015-09-24 (×5): qty 2

## 2015-09-24 MED ORDER — PANTOPRAZOLE SODIUM 40 MG PO TBEC
40.0000 mg | DELAYED_RELEASE_TABLET | Freq: Two times a day (BID) | ORAL | Status: DC
  Administered 2015-09-24 – 2015-09-27 (×6): 40 mg via ORAL
  Filled 2015-09-24 (×6): qty 1

## 2015-09-24 MED ORDER — SUCRALFATE 1 G PO TABS
1.0000 g | ORAL_TABLET | Freq: Three times a day (TID) | ORAL | Status: DC
  Administered 2015-09-24 – 2015-09-26 (×8): 1 g via ORAL
  Filled 2015-09-24 (×9): qty 1

## 2015-09-24 MED ORDER — SIMETHICONE 80 MG PO CHEW
160.0000 mg | CHEWABLE_TABLET | Freq: Four times a day (QID) | ORAL | Status: DC | PRN
  Administered 2015-09-24 – 2015-09-27 (×8): 160 mg via ORAL
  Filled 2015-09-24 (×8): qty 2

## 2015-09-24 MED ORDER — ACETAMINOPHEN 10 MG/ML IV SOLN
1000.0000 mg | Freq: Once | INTRAVENOUS | Status: AC
  Administered 2015-09-24: 1000 mg via INTRAVENOUS
  Filled 2015-09-24: qty 100

## 2015-09-24 NOTE — Care Management Note (Signed)
Case Management Note  Patient Details  Name: [name] [name] MRN: [id number] Date of Birth: 1945-09-18  Subjective/Objective:69 y/o f admitted w/Pancreatitis. From home.                    Action/Plan:d/c plan home.   Expected Discharge Date:   (unknown)               Expected Discharge Plan:  Home/Self Care  In-House Referral:     Discharge planning Services  CM Consult  Post Acute Care Choice:    Choice offered to:     DME Arranged:    DME Agency:     HH Arranged:    HH Agency:     Status of Service:  In process, will continue to follow  If discussed at Long Length of Stay Meetings, dates discussed:    Additional Comments:  Dessa Phi, RN 09/24/2015, 12:37 PM

## 2015-09-24 NOTE — Progress Notes (Signed)
[facility] PROGRESS NOTE  [name] [name] [id number] DOB: 11-10-1945 DOA: 09/23/2015 PCP: [name], MD  Interim summary and HPI 70 y.o. 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 [name] Rheum, HTN, COPD and hemochromatosis who presents with epigastric pain.  The patient developed some urinary symptoms and hematuria earlier this spring for which she was evaluated by Urology with a CT abdomen that incidentally showed a RLL cavitary mass, referred to Dr. Larkin Ina of [place]. Repeat CT showed this cavitary mass that was biopsied and actually showed vasculitis, P-Anca positive, diagnosed as GPA/Wegener's. She was initially started on high dose oral steroids in April, slowly tapered over the last month (due to stop tomorrow), and has gotten weekly rituximab with Dr. [name] of Rheumatology this month (last infusion is due tomorrow).  However, in the interim, she has developed sharp epigastric pain radiating to the upper back. This is constant but severe at times, worse with eating and associated with decreased appetite, nausea and intermittent vomiting.  Found to have elevated lipase and with CT abd and pelvis demonstrating pancreatitis with pseudocyst (4cm).  Assessment/Plan: 1. Pancreatitis with pseudocyst:  6 weeks of pain with eating, now with pseudocyst. Hemochromatosis with normal ferritin, followed by Dr. [name].  -The pseudocyst was discussed with GI who see no reason to pursue drainage, unless cyst became obviously infected/febrile/WBC climbing or worsening pain. -will follow lipase -continue supportive care -continue IVF's and PRN analgesics -lipid panel with TG of 229 -etiology for pancreatitis most likely due to prolong use of prednisone -flutter valve added -will follow clinical response  2. Granulomatosis with polyangiitis (Wegen):  Followed by Dr. [name] at [place]. Last Rituxan infusion was plan for 09/23/15. -Family  will call to reschedule Rituxan for after discharge. -has Finish Medrol with 4 mg today; will follow VS and needs for further steroids -has also finished Bactrim (on for prophylaxis while on steroids) t -continue outpatient follow up with rheumatology   3. Hypokalemia:  -will replete as needed -will follow electrolytes intermittently   4. HTN and hx non-obs CAD:  -Continue metoprolol and aspirin -no CP  5. Hx of emphysema:  -Continue Breo daily -Smoking cessation encouraged/advised   6. Depression/anxiety:  -Continue diazepam PRN -no SI or hallucinations   7. Smoking:  -Cessation counseling provided -patient looking to quit  8. Hyperglycemia: -From prednisone and pancreatitis -will continue Daily CBG and SSI only if needed (CBG's > 200) -will check A1C  9. Leukocytosis: -In setting of steroids -will monitor trend  10. Tachycardia: -Likely from pain, but as high as 120 without ECG confirmation. Will observe on tele overnight for arrhythmia. D/c if sinus.  Code Status: Full Family Communication: no family at bedside Disposition Plan: remains inpatient; continue supportive care, IVF's and pain meds. Home when medically stable (most likely another 48 hours or so).    Consultants:  GI (curbside by Dr. [name], no need for intervention or drainage on her pseudocyst currently. Continue treatment for pancreatitis)  Procedures:  See below for x-ray reports   Antibiotics:  None   HPI/Subjective: Feeling bad. Complaining of moderate to severe pain, positive ongoing nausea and mild SOB (unable to take deep breaths due to pain).  Objective: Filed Vitals:   09/23/15 2123 09/24/15 0419  BP: 104/70 124/76  Pulse: 100 102  Temp: 98.4 F (36.9 C) 99.1 F (37.3 C)  Resp: 24 20    Intake/Output Summary (Last 24 hours) at 09/24/15 1247 Last  data filed at 09/24/15 0700  Gross per 24 hour  Intake 2262.5 ml  Output      0 ml  Net 2262.5 ml   Filed Weights    09/23/15 0820  Weight: 66.225 kg (146 lb)    Exam:   General:  Afebrile. No CP. Reports mild SOB (unable to take deep breaths due to pain). Complaining of moderate severe pain in her abd and with ongoing associated nausea.  Cardiovascular: S1 and S2, positive tachycardia, no rubs, no gallops  Respiratory: no wheezing or crackles  Abdomen: soft, significantly tender to palpation in mid epigastric area and RUQ/LUQ; positive BS. Last BM on 6/27  Musculoskeletal: no edema, no cyanosis   Data Reviewed: Basic Metabolic Panel:  Recent Labs Lab 09/23/15 0855 09/23/15 0904 09/24/15 0348  NA 136 134* 136  K 3.1* 2.8* 3.6  CL 100* 99* 107  CO2 26  --  23  GLUCOSE 214* 216* 95  BUN 13 10 8   CREATININE 0.75 0.80 0.60  CALCIUM 8.8*  --  8.0*  MG 2.0  --   --    Liver Function Tests:  Recent Labs Lab 09/23/15 0855 09/24/15 0348  AST 29  28 20   ALT 62*  59* 46  ALKPHOS 89  93 73  BILITOT 0.5  1.0 0.8  PROT 6.1*  6.2* 4.7*  ALBUMIN 2.5*  2.8* 2.2*    Recent Labs Lab 09/23/15 0855  LIPASE 108*   No results for input(s): AMMONIA in the last 168 hours. CBC:  Recent Labs Lab 09/23/15 0855 09/23/15 0904 09/24/15 0348  WBC 13.4*  --  9.3  NEUTROABS 11.9*  --   --   HGB 13.9 13.3 11.1*  HCT 37.6 39.0 31.7*  MCV 91.3  --  92.7  PLT 170  --  127*   CBG:  Recent Labs Lab 09/24/15 0558  GLUCAP 99    No results found for this or any previous visit (from the past 240 hour(s)).   Studies: Ct Abdomen Pelvis W Contrast  09/23/2015  CLINICAL DATA:  Lower abdominal pain radiating to epigastric area in mid back for 6 weeks. Nausea. EXAM: CT ABDOMEN AND PELVIS WITH CONTRAST TECHNIQUE: Multidetector CT imaging of the abdomen and pelvis was performed using the standard protocol following bolus administration of intravenous contrast. CONTRAST:  151mL ISOVUE-300 IOPAMIDOL (ISOVUE-300) INJECTION 61% COMPARISON:  No CT 06/10/2015.  PET CT 07/07/2015.  Ne. FINDINGS: Lower  chest: 2.8 cm partially cavitary mass noted in the right lower lobe. An adjacent small right pleural effusion, new since prior studies. Heart is normal size. Hepatobiliary: Stable hypodensities in the liver, likely cysts. Prior cholecystectomy. No biliary ductal dilatation. Pancreas: Stranding noted around the pancreas with small fluid collections, compatible with acute pancreatitis and early formation of pseudocysts. The largest is in the region of the pancreatic body measuring up to 4.1 cm. Fluid and developing focal fluid collections extends into the left anterior para renal space. No focal pancreatic abnormality. The pancreatic duct is mildly prominent, new since prior study. Spleen: No focal abnormality.  Normal size. Adrenals/Urinary Tract: No adrenal abnormality. No focal renal abnormality. No stones or hydronephrosis. Urinary bladder is unremarkable. Stomach/Bowel: Stomach, large and small bowel grossly unremarkable. Vascular/Lymphatic: Diffuse aortic and iliac calcifications. Mild ectasia of the distal abdominal aorta measuring 2.3 cm. No aneurysm. No adenopathy. Reproductive: Prior hysterectomy.  No adnexal masses. Other: No free fluid or free air. Small bilateral inguinal hernias containing fat. Musculoskeletal: No acute bony abnormality or focal  bone lesion. IMPRESSION: Right lower lobe cavitary lesion again noted. There is a new small right pleural effusion. Fluid an developing fluid collections around the pancreas compatible with acute pancreatitis and developing pseudocysts. Aortic atherosclerosis. Benign-appearing cysts in the liver. Electronically Signed   By: Rolm Baptise M.D.   On: 09/23/2015 10:02   Dg Chest Port 1 View  09/23/2015  CLINICAL DATA:  Shortness of breath with fatigue and weakness EXAM: PORTABLE CHEST 1 VIEW COMPARISON:  July 17, 2015 chest radiograph and chest CT June 26, 2015 FINDINGS: The ill-defined mass in the right lower lobe is slightly less well seen compared to recent  prior study. In particular, the cavitation in this area is not appreciable on current radiographic examination. There is scarring in the right base. There is new slight left base atelectasis laterally. Elsewhere lungs are clear. Heart size and pulmonary vascularity are normal. No adenopathy. No bone lesions. IMPRESSION: Previously noted mass in the right lower lobe is less well seen compared to prior studies. There is scarring in the right base. There is new atelectasis in the lateral left base. Lungs elsewhere clear. Stable cardiac silhouette. Electronically Signed   By: Lowella Grip III M.D.   On: 09/23/2015 09:09    Scheduled Meds: . clotrimazole  1 Applicatorful Vaginal QHS  . fluticasone furoate-vilanterol  1 puff Inhalation Daily  . metoprolol succinate  25 mg Oral QHS  . metoprolol succinate  50 mg Oral Daily  . pantoprazole  40 mg Oral BID  . sodium chloride flush  3 mL Intravenous Q12H  . sucralfate  1 g Oral TID WC & HS   Continuous Infusions: . 0.9 % NaCl with KCl 20 mEq / L 125 mL/hr at 09/24/15 [id number]    Principal Problem:   Pancreatitis Active Problems:   Essential hypertension   Depression   Hypokalemia   COPD (chronic obstructive pulmonary disease) with emphysema ([place])   Tobacco user   Wegener's granulomatosis with renal involvement ([place])   Pancreatic pseudocyst    Time spent: 30 minutes    Barton Dubois  [facility] Pager [phone]. If 7PM-7AM, please contact night-coverage at [email or web address], password [facility] 09/24/2015, 12:47 PM  LOS: 1 day

## 2015-09-25 LAB — COMPREHENSIVE METABOLIC PANEL
ALBUMIN: 2.4 g/dL — AB (ref 3.5–5.0)
ALK PHOS: 81 U/L (ref 38–126)
ALT: 51 U/L (ref 14–54)
AST: 26 U/L (ref 15–41)
Anion gap: 8 (ref 5–15)
BUN: 7 mg/dL (ref 6–20)
CALCIUM: 8.3 mg/dL — AB (ref 8.9–10.3)
CHLORIDE: 106 mmol/L (ref 101–111)
CO2: 21 mmol/L — AB (ref 22–32)
CREATININE: 0.49 mg/dL (ref 0.44–1.00)
GFR calc non Af Amer: >60 mL/min (ref >60)
GLUCOSE: 70 mg/dL (ref 65–99)
Potassium: 3.8 mmol/L (ref 3.5–5.1)
SODIUM: 135 mmol/L (ref 135–145)
Total Bilirubin: 1 mg/dL (ref 0.3–1.2)
Total Protein: 5.5 g/dL — ABNORMAL LOW (ref 6.5–8.1)

## 2015-09-25 LAB — LIPASE, BLOOD: Lipase: 94 U/L — ABNORMAL HIGH (ref 11–51)

## 2015-09-25 LAB — GLUCOSE, CAPILLARY: Glucose-Capillary: 68 mg/dL (ref 65–99)

## 2015-09-25 LAB — HEMOGLOBIN A1C
Hgb A1c MFr Bld: 6.5 % — ABNORMAL HIGH (ref 4.8–5.6)
MEAN PLASMA GLUCOSE: 140 mg/dL

## 2015-09-25 LAB — CBC
HCT: 35.8 % — ABNORMAL LOW (ref 36.0–46.0)
Hemoglobin: 12.5 g/dL (ref 12.0–15.0)
MCH: 33.2 pg (ref 26.0–34.0)
MCHC: 34.9 g/dL (ref 30.0–36.0)
MCV: 95 fL (ref 78.0–100.0)
Platelets: 162 10*3/uL (ref 150–400)
RBC: 3.77 MIL/uL — ABNORMAL LOW (ref 3.87–5.11)
RDW: 14.3 % (ref 11.5–15.5)
WBC: 12.2 10*3/uL — ABNORMAL HIGH (ref 4.0–10.5)

## 2015-09-25 LAB — MAGNESIUM: Magnesium: 1.7 mg/dL (ref 1.7–2.4)

## 2015-09-25 MED ORDER — DEXTROSE-NACL 5-0.9 % IV SOLN
INTRAVENOUS | Status: DC
  Administered 2015-09-25 – 2015-09-26 (×3): via INTRAVENOUS

## 2015-09-25 NOTE — Progress Notes (Signed)
[facility] PROGRESS NOTE  [name] [name] [id number] DOB: 07/18/1945 DOA: 09/23/2015 PCP: [name], MD  Interim summary and HPI 70 y.o. 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 [name] Rheum, HTN, COPD and hemochromatosis who presents with epigastric pain.  The patient developed some urinary symptoms and hematuria earlier this spring for which she was evaluated by Urology with a CT abdomen that incidentally showed a RLL cavitary mass, referred to Dr. Larkin Ina of [place]. Repeat CT showed this cavitary mass that was biopsied and actually showed vasculitis, P-Anca positive, diagnosed as GPA/Wegener's. She was initially started on high dose oral steroids in April, slowly tapered over the last month (due to stop tomorrow), and has gotten weekly rituximab with Dr. [name] of Rheumatology this month (last infusion is due tomorrow).  However, in the interim, she has developed sharp epigastric pain radiating to the upper back. This is constant but severe at times, worse with eating and associated with decreased appetite, nausea and intermittent vomiting.  Found to have elevated lipase and with CT abd and pelvis demonstrating pancreatitis with pseudocyst (4cm).  Assessment/Plan: 1. Pancreatitis with pseudocyst:  6 weeks of pain with eating, now with pseudocyst. Hemochromatosis with normal ferritin, followed by Dr. [name].  -The pseudocyst was discussed with GI who see no reason to pursue drainage, unless cyst became obviously infected/febrile/WBC climbing or worsening pain. -Lipase trending appropriately and pain even still present, is subsiding  -continue supportive care and slowly advance diet -continue IVF's and PRN analgesics -lipid panel with TG of 229 -etiology for pancreatitis most likely due to prolong use of prednisone -continue flutter valve  -will follow clinical response  2. Granulomatosis with polyangiitis (Wegen):   Followed by Dr. [name] at [place]. Last Rituxan infusion was plan for 09/23/15. -Family will call to reschedule Rituxan for after discharge. -has Finish Medrol with 4 mg today; will follow VS and needs for further steroids -has also finished Bactrim (on for prophylaxis while on steroids)  -continue outpatient follow up with rheumatology service at discharge  3. Hypokalemia:  -will replete further as needed -currently WNL  4. HTN and hx non-obs CAD:  -Continue metoprolol and aspirin -no CP reported   5. Hx of emphysema:  -Continue Breo daily -Smoking cessation encouraged/advised  -no wheezing  6. Depression/anxiety:  -Continue diazepam PRN -no SI or hallucinations   7. Smoking:  -Cessation counseling provided -patient looking to quit  8. Hyperglycemia: -From prednisone and pancreatitis -will continue Daily CBG and SSI only if needed (CBG's > 200) -A1C 6.5  9. Leukocytosis: -In setting of steroids -will monitor trend; patient afebrile  10. Tachycardia: -Likely from pain, but as high as 120 without ECG confirmation.  -HR has improved and has remained sinus. -will d/c tele.  Code Status: Full Family Communication: no family at bedside Disposition Plan: remains inpatient; continue supportive care, IVF's and pain meds. Home when medically stable (most likely another 48 hours or so).    Consultants:  GI (curbside by Dr. [name], no need for intervention or drainage on her pseudocyst currently. Continue treatment for pancreatitis)  Procedures:  See below for x-ray reports   Antibiotics:  None   HPI/Subjective: Feeling better overall. Even still with abd pain. No nausea, no vomiting.  Objective: Filed Vitals:   09/25/15 0802 09/25/15 1319  BP: 135/66 131/73  Pulse: 104 102  Temp: 99 F (37.2 C) 98.9 F (37.2 C)  Resp: 20 20  Intake/Output Summary (Last 24 hours) at 09/25/15 1645 Last data filed at 09/25/15 1438  Gross per 24 hour  Intake  2473.34 ml  Output    900 ml  Net 1573.34 ml   Filed Weights   09/23/15 0820  Weight: 66.225 kg (146 lb)    Exam:   General:  Afebrile. No CP. Reports no SOB and overall feeling better. No further nausea or vomiting. Still with abd pain and afraid to eat.   Cardiovascular: S1 and S2, positive tachycardia, no rubs, no gallops  Respiratory: no wheezing or crackles on exam  Abdomen: soft, significantly tender to palpation in mid epigastric area and RUQ/LUQ; positive BS. Last BM on 6/27; passing a lot of gas  Musculoskeletal: no edema, no cyanosis   Data Reviewed: Basic Metabolic Panel:  Recent Labs Lab 09/23/15 0855 09/23/15 0904 09/24/15 0348 09/25/15 0517  NA 136 134* 136 135  K 3.1* 2.8* 3.6 3.8  CL 100* 99* 107 106  CO2 26  --  23 21*  GLUCOSE 214* 216* 95 70  BUN 13 10 8 7   CREATININE 0.75 0.80 0.60 0.49  CALCIUM 8.8*  --  8.0* 8.3*  MG 2.0  --   --  1.7   Liver Function Tests:  Recent Labs Lab 09/23/15 0855 09/24/15 0348 09/25/15 0517  AST 29  28 20 26   ALT 62*  59* 46 51  ALKPHOS 89  93 73 81  BILITOT 0.5  1.0 0.8 1.0  PROT 6.1*  6.2* 4.7* 5.5*  ALBUMIN 2.5*  2.8* 2.2* 2.4*    Recent Labs Lab 09/23/15 0855 09/25/15 0517  LIPASE 108* 94*   No results for input(s): AMMONIA in the last 168 hours.   CBC:  Recent Labs Lab 09/23/15 0855 09/23/15 0904 09/24/15 0348 09/25/15 0517  WBC 13.4*  --  9.3 12.2*  NEUTROABS 11.9*  --   --   --   HGB 13.9 13.3 11.1* 12.5  HCT 37.6 39.0 31.7* 35.8*  MCV 91.3  --  92.7 95.0  PLT 170  --  127* 162   CBG:  Recent Labs Lab 09/24/15 0558 09/25/15 0508  GLUCAP 99 68   Studies: No results found.  Scheduled Meds: . budesonide (PULMICORT) nebulizer solution  0.25 mg Nebulization BID  . clotrimazole  1 Applicatorful Vaginal QHS  . fluticasone furoate-vilanterol  1 puff Inhalation Daily  . metoprolol succinate  25 mg Oral QHS  . metoprolol succinate  50 mg Oral Daily  . pantoprazole  40 mg  Oral BID  . sodium chloride flush  3 mL Intravenous Q12H  . sucralfate  1 g Oral TID WC & HS   Continuous Infusions: . dextrose 5 % and 0.9% NaCl 75 mL/hr at 09/25/15 1613    Principal Problem:   Pancreatitis Active Problems:   Essential hypertension   Depression   Hypokalemia   COPD (chronic obstructive pulmonary disease) with emphysema ([place])   Tobacco user   Wegener's granulomatosis with renal involvement ([place])   Pancreatic pseudocyst    Time spent: 30 minutes    Barton Dubois  [facility] Pager [phone]. If 7PM-7AM, please contact night-coverage at [email or web address], password [facility] 09/25/2015, 4:45 PM  LOS: 2 days

## 2015-09-26 ENCOUNTER — Encounter: Payer: Medicare Other | Admitting: Internal Medicine

## 2015-09-26 LAB — URINE MICROSCOPIC-ADD ON

## 2015-09-26 LAB — BASIC METABOLIC PANEL
Anion gap: 7 (ref 5–15)
BUN: 5 mg/dL — ABNORMAL LOW (ref 6–20)
CO2: 24 mmol/L (ref 22–32)
Calcium: 8 mg/dL — ABNORMAL LOW (ref 8.9–10.3)
Chloride: 103 mmol/L (ref 101–111)
Creatinine, Ser: 0.42 mg/dL — ABNORMAL LOW (ref 0.44–1.00)
GFR calc Af Amer: >60 mL/min (ref >60)
GFR calc non Af Amer: >60 mL/min (ref >60)
Glucose, Bld: 173 mg/dL — ABNORMAL HIGH (ref 65–99)
Potassium: 2.6 mmol/L — CL (ref 3.5–5.1)
Sodium: 134 mmol/L — ABNORMAL LOW (ref 135–145)

## 2015-09-26 LAB — URINALYSIS, ROUTINE W REFLEX MICROSCOPIC
Bilirubin Urine: NEGATIVE
GLUCOSE, UA: NEGATIVE mg/dL
KETONES UR: NEGATIVE mg/dL
NITRITE: NEGATIVE
PROTEIN: NEGATIVE mg/dL
Specific Gravity, Urine: 1.014 (ref 1.005–1.030)
pH: 5.5 (ref 5.0–8.0)

## 2015-09-26 LAB — CBC
HCT: 32.1 % — ABNORMAL LOW (ref 36.0–46.0)
Hemoglobin: 11.4 g/dL — ABNORMAL LOW (ref 12.0–15.0)
MCH: 33 pg (ref 26.0–34.0)
MCHC: 35.5 g/dL (ref 30.0–36.0)
MCV: 93 fL (ref 78.0–100.0)
Platelets: 137 10*3/uL — ABNORMAL LOW (ref 150–400)
RBC: 3.45 MIL/uL — ABNORMAL LOW (ref 3.87–5.11)
RDW: 14.3 % (ref 11.5–15.5)
WBC: 8.5 10*3/uL (ref 4.0–10.5)

## 2015-09-26 LAB — GLUCOSE, CAPILLARY: GLUCOSE-CAPILLARY: 234 mg/dL — AB (ref 65–99)

## 2015-09-26 MED ORDER — SODIUM CHLORIDE 0.9 % IV SOLN
INTRAVENOUS | Status: DC
  Administered 2015-09-26: 19:00:00 via INTRAVENOUS

## 2015-09-26 MED ORDER — FLUCONAZOLE 100 MG PO TABS
100.0000 mg | ORAL_TABLET | ORAL | Status: DC
  Administered 2015-09-26: 100 mg via ORAL
  Filled 2015-09-26: qty 1

## 2015-09-26 MED ORDER — MAGNESIUM SULFATE 2 GM/50ML IV SOLN
2.0000 g | Freq: Once | INTRAVENOUS | Status: AC
  Administered 2015-09-26: 2 g via INTRAVENOUS
  Filled 2015-09-26: qty 50

## 2015-09-26 MED ORDER — POTASSIUM CHLORIDE CRYS ER 20 MEQ PO TBCR
40.0000 meq | EXTENDED_RELEASE_TABLET | ORAL | Status: AC
  Administered 2015-09-26 (×3): 40 meq via ORAL
  Filled 2015-09-26 (×3): qty 2

## 2015-09-26 NOTE — Care Management Important Message (Signed)
Important Message  Patient Details  Name: [name] [name] MRN: [id number] Date of Birth: 1945-06-22   Medicare Important Message Given:  Yes    Camillo Flaming 09/26/2015, 10:58 AMImportant Message  Patient Details  Name: [name] [name] MRN: [id number] Date of Birth: [birth year]   Medicare Important Message Given:  Yes    Camillo Flaming 09/26/2015, 10:57 AM

## 2015-09-26 NOTE — Progress Notes (Signed)
[facility] PROGRESS NOTE  [name] [name] [id number] DOB: [birth year] DOA: 09/23/2015 PCP: [name], MD  Interim summary and HPI 70 y.o. 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 [name] Rheum, HTN, COPD and hemochromatosis who presents with epigastric pain.  The patient developed some urinary symptoms and hematuria earlier this spring for which she was evaluated by Urology with a CT abdomen that incidentally showed a RLL cavitary mass, referred to Dr. Larkin Ina of [place]. Repeat CT showed this cavitary mass that was biopsied and actually showed vasculitis, P-Anca positive, diagnosed as GPA/Wegener's. She was initially started on high dose oral steroids in April, slowly tapered over the last month (due to stop tomorrow), and has gotten weekly rituximab with Dr. [name] of Rheumatology this month (last infusion is due tomorrow).  However, in the interim, she has developed sharp epigastric pain radiating to the upper back. This is constant but severe at times, worse with eating and associated with decreased appetite, nausea and intermittent vomiting.  Found to have elevated lipase and with CT abd and pelvis demonstrating pancreatitis with pseudocyst (4cm).  Assessment/Plan: 1. Pancreatitis with pseudocyst:  6 weeks of pain with eating, now with pseudocyst. Hemochromatosis with normal ferritin, followed by Dr. [name].  -The pseudocyst was discussed with GI who see no reason to pursue drainage, unless cyst became obviously infected/febrile/WBC climbing or worsening pain. -Lipase trending appropriately and pain even still present, is subsiding/improving  -continue supportive care and slowly advancing diet -continue IVF's and PRN analgesics -lipid panel with TG of 229 -etiology for pancreatitis most likely due to prolong use of prednisone -continue flutter valve  -will follow clinical response; home in the next 24 hours hopefully.  2.  Granulomatosis with polyangiitis (Wegen):  Followed by Dr. [name] at [place]. Last Rituxan infusion was plan for 09/23/15. -Family will call to reschedule Rituxan for after discharge. -has Finish Medrol with 4 mg today; will follow VS and needs for further steroids -has also finished Bactrim (on for prophylaxis while on steroids)  -continue outpatient follow up with rheumatology service at discharge  3. Hypokalemia:  -will replete as needed (most likely from diarrhea) -Mg WNL  4. HTN and hx non-obs CAD:  -Continue metoprolol and aspirin -no CP reported   5. Hx of emphysema:  -Continue Breo daily -Smoking cessation encouraged/advised  -no wheezing  6. Depression/anxiety:  -Continue diazepam PRN -no SI or hallucinations   7. Smoking:  -Cessation counseling provided -patient looking to quit  8. Hyperglycemia: -From prednisone and pancreatitis -will continue Daily CBG and SSI only if needed (CBG's > 200) -A1C 6.5  9. Leukocytosis: -In setting of steroids -will monitor trend; patient afebrile; but given dysuria will check UA  10. Tachycardia: -Likely from pain, but as high as 120 without ECG confirmation.  -HR has improved and has remained sinus. -telemetry discontinued on 6/29   11. Dysuria: -will check UA -since she described some itching sensation as well. Will treat with diflucan.  Code Status: Full Family Communication: no family at bedside Disposition Plan: remains inpatient; continue supportive care, IVF's and pain meds. Home when medically stable (most likely another 24 hours or so).    Consultants:  GI (curbside by Dr. [name], no need for intervention or drainage on her pseudocyst currently. Continue treatment for pancreatitis)  Procedures:  See below for x-ray reports   Antibiotics:  None   HPI/Subjective: Feeling much better overall. Even still with some abd pain, diarrhea  and also complaining of dysuria. No nausea, no  vomiting.  Objective: Filed Vitals:   09/26/15 0514 09/26/15 1445  BP: 115/95 115/62  Pulse: 122 91  Temp: 98.6 F (37 C) 98.1 F (36.7 C)  Resp: 20 22    Intake/Output Summary (Last 24 hours) at 09/26/15 1747 Last data filed at 09/26/15 1440  Gross per 24 hour  Intake 2350.42 ml  Output   1600 ml  Net 750.42 ml   Filed Weights   09/23/15 0820  Weight: 66.225 kg (146 lb)    Exam:   General:  Afebrile. No CP. Reports no SOB and overall feeling better. No further nausea or vomiting. Still with abd pain, but improved. Reports some diarrhea and also dysuria.    Cardiovascular: S1 and S2, positive tachycardia, no rubs, no gallops  Respiratory: no wheezing or crackles on exam  Abdomen: soft, mild to moderate tenderness to palpation in mid epigastric area and RUQ/LUQ; positive BS. Last BM on 6/30 (diarrhea)  Musculoskeletal: no edema, no cyanosis   Data Reviewed: Basic Metabolic Panel:  Recent Labs Lab 09/23/15 0855 09/23/15 0904 09/24/15 0348 09/25/15 0517 09/26/15 0718  NA 136 134* 136 135 134*  K 3.1* 2.8* 3.6 3.8 2.6*  CL 100* 99* 107 106 103  CO2 26  --  23 21* 24  GLUCOSE 214* 216* 95 70 173*  BUN 13 10 8 7  5*  CREATININE 0.75 0.80 0.60 0.49 0.42*  CALCIUM 8.8*  --  8.0* 8.3* 8.0*  MG 2.0  --   --  1.7  --    Liver Function Tests:  Recent Labs Lab 09/23/15 0855 09/24/15 0348 09/25/15 0517  AST 29  28 20 26   ALT 62*  59* 46 51  ALKPHOS 89  93 73 81  BILITOT 0.5  1.0 0.8 1.0  PROT 6.1*  6.2* 4.7* 5.5*  ALBUMIN 2.5*  2.8* 2.2* 2.4*    Recent Labs Lab 09/23/15 0855 09/25/15 0517  LIPASE 108* 94*   CBC:  Recent Labs Lab 09/23/15 0855 09/23/15 0904 09/24/15 0348 09/25/15 0517 09/26/15 0718  WBC 13.4*  --  9.3 12.2* 8.5  NEUTROABS 11.9*  --   --   --   --   HGB 13.9 13.3 11.1* 12.5 11.4*  HCT 37.6 39.0 31.7* 35.8* 32.1*  MCV 91.3  --  92.7 95.0 93.0  PLT 170  --  127* 162 137*   CBG:  Recent Labs Lab 09/24/15 0558  09/25/15 0508 09/26/15 0519  GLUCAP 99 68 234*   Studies: No results found.  Scheduled Meds: . budesonide (PULMICORT) nebulizer solution  0.25 mg Nebulization BID  . clotrimazole  1 Applicatorful Vaginal QHS  . fluconazole  100 mg Oral Q24H  . fluticasone furoate-vilanterol  1 puff Inhalation Daily  . metoprolol succinate  25 mg Oral QHS  . metoprolol succinate  50 mg Oral Daily  . pantoprazole  40 mg Oral BID  . potassium chloride  40 mEq Oral Q4H  . sodium chloride flush  3 mL Intravenous Q12H   Continuous Infusions: . sodium chloride    . dextrose 5 % and 0.9% NaCl 75 mL/hr at 09/26/15 0350    Principal Problem:   Pancreatitis Active Problems:   Essential hypertension   Depression   Hypokalemia   COPD (chronic obstructive pulmonary disease) with emphysema ([facility])   Tobacco user   Wegener's granulomatosis with renal involvement ([place])   Pancreatic pseudocyst    Time spent: 30 minutes  Barton Dubois  [facility] Pager [phone]. If 7PM-7AM, please contact night-coverage at [email or web address], password [facility] 09/26/2015, 5:47 PM  LOS: 3 days

## 2015-09-27 DIAGNOSIS — K859 Acute pancreatitis without necrosis or infection, unspecified: Secondary | ICD-10-CM | POA: Insufficient documentation

## 2015-09-27 LAB — BASIC METABOLIC PANEL
ANION GAP: 7 (ref 5–15)
BUN: 5 mg/dL — ABNORMAL LOW (ref 6–20)
CALCIUM: 8.3 mg/dL — AB (ref 8.9–10.3)
CO2: 25 mmol/L (ref 22–32)
Chloride: 106 mmol/L (ref 101–111)
Creatinine, Ser: 0.66 mg/dL (ref 0.44–1.00)
GLUCOSE: 118 mg/dL — AB (ref 65–99)
POTASSIUM: 4 mmol/L (ref 3.5–5.1)
Sodium: 138 mmol/L (ref 135–145)

## 2015-09-27 LAB — HEPATIC FUNCTION PANEL
ALBUMIN: 2.2 g/dL — AB (ref 3.5–5.0)
ALT: 44 U/L (ref 14–54)
AST: 23 U/L (ref 15–41)
Alkaline Phosphatase: 75 U/L (ref 38–126)
Bilirubin, Direct: 0.2 mg/dL (ref 0.1–0.5)
Indirect Bilirubin: 0.5 mg/dL (ref 0.3–0.9)
TOTAL PROTEIN: 5 g/dL — AB (ref 6.5–8.1)
Total Bilirubin: 0.7 mg/dL (ref 0.3–1.2)

## 2015-09-27 LAB — LIPASE, BLOOD: LIPASE: 97 U/L — AB (ref 11–51)

## 2015-09-27 LAB — GLUCOSE, CAPILLARY: GLUCOSE-CAPILLARY: 136 mg/dL — AB (ref 65–99)

## 2015-09-27 MED ORDER — OMEPRAZOLE 20 MG PO CPDR: 20.0000 mg | DELAYED_RELEASE_CAPSULE | Freq: Two times a day (BID) | ORAL | Status: DC

## 2015-09-27 MED ORDER — OXYCODONE HCL 5 MG PO TABS: 5.0000 mg | ORAL_TABLET | Freq: Four times a day (QID) | ORAL | Status: DC | PRN

## 2015-09-27 MED ORDER — ONDANSETRON 8 MG PO TBDP: 8.0000 mg | ORAL_TABLET | Freq: Three times a day (TID) | ORAL | Status: AC | PRN

## 2015-09-27 MED ORDER — FLUCONAZOLE 100 MG PO TABS: 100.0000 mg | ORAL_TABLET | ORAL | Status: DC

## 2015-09-27 NOTE — Discharge Summary (Signed)
Physician Discharge Summary  [name] [name] [id number] DOB: Jul 31, 1945 DOA: 09/23/2015  PCP: [name], MD  Admit date: 09/23/2015 Discharge date: 09/27/2015  Time spent: 35 minutes  Recommendations for Outpatient Follow-up:  Repeat Lipase and CMET to follow Liver function, electrolytes and renal function  Close follow up of patient CBG's and A1C; patient advise to follow low carb diet and lifestyle changes. CT abd in near future to follow pancreatic pseudocyst  (sooner if worsening pain, fever and/or signs of infection)  Discharge Diagnoses:  Principal Problem:   Pancreatitis Active Problems:   Essential hypertension   Depression   Hypokalemia   COPD (chronic obstructive pulmonary disease) with emphysema ([place])   Tobacco user   Wegener's granulomatosis with renal involvement ([place])   Pancreatic pseudocyst   Discharge Condition: stable and improved. Will discharge home with follow up with PCP in 10 days and with GI in 2-3 weeks.  Diet recommendation: heart healthy with low fat and modified carbohydrates   Filed Weights   09/23/15 0820  Weight: 66.225 kg (146 lb)    History of present illness:  70 y.o. 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 [name] Rheum, HTN, COPD and hemochromatosis who presents with epigastric pain.  The patient developed some urinary symptoms and hematuria earlier this spring for which she was evaluated by Urology with a CT abdomen that incidentally showed a RLL cavitary mass, referred to Dr. Larkin Ina of [facility]. Repeat CT showed this cavitary mass that was biopsied and actually showed vasculitis, P-Anca positive, diagnosed as GPA/Wegener's. She was initially started on high dose oral steroids in April, slowly tapered over the last month (due to stop tomorrow), and has gotten weekly rituximab with Dr. [name] of Rheumatology this month (last infusion is due tomorrow).  However, in the interim, she has developed  sharp epigastric pain radiating to the upper back. This is constant but severe at times, worse with eating and associated with decreased appetite, nausea and intermittent vomiting.  Found to have elevated lipase and with CT abd and pelvis demonstrating pancreatitis with pseudocyst (4cm).  Hospital Course:  1. Pancreatitis with pseudocyst:  6 weeks of pain with eating, now with pseudocyst. Hemochromatosis with normal ferritin, followed by Dr. [name].  -The pseudocyst was discussed with GI who see no reason to pursue drainage, unless cyst became obviously infected/febrile/WBC climbing or worsening pain after pancreatitis resolves. will need outpatient follow up with GI about this. -Lipase trending down appropriately and pain even still present, has subsided/significantly improved  -continue PRN analgesics -lipid panel with TG of 229 -etiology for pancreatitis most likely due to use of prednisone and bactrim -diet advance and tolerated; encourage to follow low fat and modified carbohydrates -advise to keep herself well hydrated.  2. Granulomatosis with polyangiitis ([place]):  Followed by Dr. [name] at [facility]. Last Rituxan infusion was plan for 09/23/15. -Family will call to reschedule due Rituxan infusion for after discharge. -has Finished Medrol and Bactrim -continue outpatient follow up with rheumatology service at discharge  3. Hypokalemia:  -repleted And WNL at discharge -Mg WNL  4. HTN and hx non-obs CAD:  -Continue metoprolol and aspirin -no CP reported  -advise to follow heart healthy diet   5. Hx of emphysema:  -Continue Breo and symbicort daily -Smoking cessation encouraged/advised  -no wheezing -outpatient follow up with pulmonologist  -continue flutter valve   6. Depression/anxiety:  -Continue diazepam PRN -no SI or hallucinations   7. Smoking:  -  Cessation counseling provided -patient looking to quit  8. Hyperglycemia/type 2 diabetes: -From  steroids use most likely  -A1C 6.5 -patient advise to follow low carb diet; looking to follow lifestyle modifications and base on outpatient course initiate treatment for DM.  9. Leukocytosis: -In setting of steroids -no signs of infection; remains afebrile and at discharge WBC's WNL  10. Tachycardia: -Likely from pain and dehydration/ -sinus on EKG and telemetry -HR has improved and has remained WNL at discharge -will continue outpatient metoprolol regimen   11. Dysuria: -UA w/o signs of infections -patient symptoms drastically improved after 1 dose of diflucan  -no fever and normal WBC's -will complete treatment for presumed yeast infection with 2 more doses of diflucan.  Procedures:  See below for x-ray reports   Consultations:  GI (curbside by Dr. [name], no need for intervention or drainage on her pseudocyst currently. Continue treatment for pancreatitis)  Discharge Exam: Filed Vitals:   09/26/15 2104 09/27/15 0529  BP: 118/59 127/58  Pulse: 98 88  Temp: 98.7 F (37.1 C) 98 F (36.7 C)  Resp: 20 20   General: Afebrile. No CP and no SOB. No further nausea or vomiting. Still with very mild abd pain, but significant improved and wanting go home. Reports some diarrhea intermittently. Improved/resolution of her dysuria.   Cardiovascular: S1 and S2, positive tachycardia, no rubs, no gallops  Respiratory: no wheezing or crackles on exam  Abdomen: soft, mild tenderness to deep palpation in mid epigastric area and RUQ/LUQ; positive BS. Last BM on 7/1 (loose stools)  Musculoskeletal: no edema, no cyanosis   Discharge Instructions   Discharge Instructions    Diet - low sodium heart healthy    Complete by:  As directed      Discharge instructions    Complete by:  As directed   Take medications as prescribed Please keep yourself well hydrated Follow a low fat and modified carbohydrates diet Arrange follow up with PCP in 10 days Please contact GI office for  follow up in the next 2-3 weeks          Current Discharge Medication List    START taking these medications   Details  fluconazole (DIFLUCAN) 100 MG tablet Take 1 tablet (100 mg total) by mouth daily. Qty: 2 tablet, Refills: 0    ondansetron (ZOFRAN ODT) 8 MG disintegrating tablet Take 1 tablet (8 mg total) by mouth every 8 (eight) hours as needed for nausea or vomiting. Qty: 20 tablet, Refills: 0    oxyCODONE (OXY IR/ROXICODONE) 5 MG immediate release tablet Take 1 tablet (5 mg total) by mouth every 6 (six) hours as needed for severe pain. Qty: 20 tablet, Refills: 0      CONTINUE these medications which have CHANGED   Details  omeprazole (PRILOSEC) 20 MG capsule Take 1 capsule (20 mg total) by mouth 2 (two) times daily before a meal.      CONTINUE these medications which have NOT CHANGED   Details  acetaminophen (TYLENOL) 500 MG tablet Take 500-1,000 mg by mouth every 8 (eight) hours as needed for mild pain or moderate pain.    albuterol (PROVENTIL) (2.5 MG/3ML) 0.083% nebulizer solution Take 3 mLs (2.5 mg total) by nebulization every 6 (six) hours as needed for wheezing or shortness of breath. Qty: 75 mL, Refills: 2   Associated Diagnoses: Asthmatic bronchitis , chronic (HCC)    budesonide-formoterol (SYMBICORT) 160-4.5 MCG/ACT inhaler Inhale 2 puffs into the lungs 2 (two) times daily. Qty: 1 Inhaler, Refills: 0  diazepam (VALIUM) 5 MG tablet Take 0.5-1 tablets (2.5-5 mg total) by mouth every 12 (twelve) hours as needed for anxiety. Qty: 60 tablet, Refills: 0    fexofenadine (ALLEGRA) 180 MG tablet Take 180 mg by mouth at bedtime.     fluticasone furoate-vilanterol (BREO ELLIPTA) 100-25 MCG/INH AEPB Inhale 1 puff into the lungs daily. Qty: 1 each, Refills: 6    Melatonin 1 MG TABS Take 1 mg by mouth at bedtime.    !! metoprolol succinate (TOPROL XL) 25 MG 24 hr tablet Take 1 tablet (25 mg total) by mouth at bedtime. Qty: 30 tablet, Refills: 6    !! metoprolol  succinate (TOPROL-XL) 50 MG 24 hr tablet Take 1 tablet (50 mg total) by mouth daily. Take with or immediately following a meal. Qty: 90 tablet, Refills: 3    riTUXimab in sodium chloride 0.9 % 250 mL Inject into the vein See admin instructions. She is receiving 4 doses with Dr. Gavin Pound at [facility]. Her next dose is due on 09/24/15.     !! - Potential duplicate medications found. Please discuss with provider.    STOP taking these medications     aspirin EC 81 MG tablet      docusate sodium (COLACE) 100 MG capsule      methylPREDNISolone (MEDROL) 4 MG tablet      sucralfate (CARAFATE) 1 g tablet      sulfamethoxazole-trimethoprim (BACTRIM DS,SEPTRA DS) 800-160 MG tablet        Allergies  Allergen Reactions  . Other Other (See Comments)    Please if giving pt anesthesia - she would like to have a scopoline patch to prevent nausea   . Ciprofloxacin Hives and Other (See Comments)    Blisters on legs  . Codeine Nausea And Vomiting and Other (See Comments)    Sweating, "passes out"  . Macrobid [employer] Macro] Other (See Comments)    Stool incontinence  . Morphine And Related Other (See Comments)    "hospital bed was shaking"  . Prevnar [Pneumococcal 13-Val Conj Vacc] Palpitations and Other (See Comments)    Pt c/o redness, swelling on arm after shot.  Pt c/o having respiratory problems and coughing ever since she got it.    Follow-up Information    Follow up with [name], MD. Schedule an appointment as soon as possible for a visit in 10 days.   Specialty:  Internal Medicine   Contact information:   [place]  [id number] [phone]       Follow up with Silvano Rusk, MD. Schedule an appointment as soon as possible for a visit in 3 weeks.   Specialty:  Gastroenterology   Contact information:   [address] [place] [address] [phone]       The results of significant diagnostics from this hospitalization  (including imaging, microbiology, ancillary and laboratory) are listed below for reference.    Significant Diagnostic Studies: Ct Abdomen Pelvis W Contrast  09/23/2015  CLINICAL DATA:  Lower abdominal pain radiating to epigastric area in mid back for 6 weeks. Nausea. EXAM: CT ABDOMEN AND PELVIS WITH CONTRAST TECHNIQUE: Multidetector CT imaging of the abdomen and pelvis was performed using the standard protocol following bolus administration of intravenous contrast. CONTRAST:  186mL ISOVUE-300 IOPAMIDOL (ISOVUE-300) INJECTION 61% COMPARISON:  No CT 06/10/2015.  PET CT 07/07/2015.  Ne. FINDINGS: Lower chest: 2.8 cm partially cavitary mass noted in the right lower lobe. An adjacent small right pleural effusion, new  since prior studies. Heart is normal size. Hepatobiliary: Stable hypodensities in the liver, likely cysts. Prior cholecystectomy. No biliary ductal dilatation. Pancreas: Stranding noted around the pancreas with small fluid collections, compatible with acute pancreatitis and early formation of pseudocysts. The largest is in the region of the pancreatic body measuring up to 4.1 cm. Fluid and developing focal fluid collections extends into the left anterior para renal space. No focal pancreatic abnormality. The pancreatic duct is mildly prominent, new since prior study. Spleen: No focal abnormality.  Normal size. Adrenals/Urinary Tract: No adrenal abnormality. No focal renal abnormality. No stones or hydronephrosis. Urinary bladder is unremarkable. Stomach/Bowel: Stomach, large and small bowel grossly unremarkable. Vascular/Lymphatic: Diffuse aortic and iliac calcifications. Mild ectasia of the distal abdominal aorta measuring 2.3 cm. No aneurysm. No adenopathy. Reproductive: Prior hysterectomy.  No adnexal masses. Other: No free fluid or free air. Small bilateral inguinal hernias containing fat. Musculoskeletal: No acute bony abnormality or focal bone lesion. IMPRESSION: Right lower lobe cavitary lesion  again noted. There is a new small right pleural effusion. Fluid an developing fluid collections around the pancreas compatible with acute pancreatitis and developing pseudocysts. Aortic atherosclerosis. Benign-appearing cysts in the liver. Electronically Signed   By: Rolm Baptise M.D.   On: 09/23/2015 10:02   Dg Chest Port 1 View  09/23/2015  CLINICAL DATA:  Shortness of breath with fatigue and weakness EXAM: PORTABLE CHEST 1 VIEW COMPARISON:  July 17, 2015 chest radiograph and chest CT June 26, 2015 FINDINGS: The ill-defined mass in the right lower lobe is slightly less well seen compared to recent prior study. In particular, the cavitation in this area is not appreciable on current radiographic examination. There is scarring in the right base. There is new slight left base atelectasis laterally. Elsewhere lungs are clear. Heart size and pulmonary vascularity are normal. No adenopathy. No bone lesions. IMPRESSION: Previously noted mass in the right lower lobe is less well seen compared to prior studies. There is scarring in the right base. There is new atelectasis in the lateral left base. Lungs elsewhere clear. Stable cardiac silhouette. Electronically Signed   By: Lowella Grip III M.D.   On: 09/23/2015 09:09   Labs: Basic Metabolic Panel:  Recent Labs Lab 09/23/15 0855 09/23/15 0904 09/24/15 0348 09/25/15 0517 09/26/15 0718 09/27/15 0532  NA 136 134* 136 135 134* 138  K 3.1* 2.8* 3.6 3.8 2.6* 4.0  CL 100* 99* 107 106 103 106  CO2 26  --  23 21* 24 25  GLUCOSE 214* 216* 95 70 173* 118*  BUN 13 10 8 7  5* 5*  CREATININE 0.75 0.80 0.60 0.49 0.42* 0.66  CALCIUM 8.8*  --  8.0* 8.3* 8.0* 8.3*  MG 2.0  --   --  1.7  --   --    Liver Function Tests:  Recent Labs Lab 09/23/15 0855 09/24/15 0348 09/25/15 0517 09/27/15 0532  AST 29  28 20 26 23   ALT 62*  59* 46 51 44  ALKPHOS 89  93 73 81 75  BILITOT 0.5  1.0 0.8 1.0 0.7  PROT 6.1*  6.2* 4.7* 5.5* 5.0*  ALBUMIN 2.5*  2.8*  2.2* 2.4* 2.2*    Recent Labs Lab 09/23/15 0855 09/25/15 0517 09/27/15 0532  LIPASE 108* 94* 97*   CBC:  Recent Labs Lab 09/23/15 0855 09/23/15 0904 09/24/15 0348 09/25/15 0517 09/26/15 0718  WBC 13.4*  --  9.3 12.2* 8.5  NEUTROABS 11.9*  --   --   --   --  HGB 13.9 13.3 11.1* 12.5 11.4*  HCT 37.6 39.0 31.7* 35.8* 32.1*  MCV 91.3  --  92.7 95.0 93.0  PLT 170  --  127* 162 137*   CBG:  Recent Labs Lab 09/24/15 0558 09/25/15 0508 09/26/15 0519 09/27/15 0516  GLUCAP 99 68 234* 136*    Signed:  Barton Dubois MD.  [facility] 09/27/2015, 12:43 PM

## 2015-09-28 ENCOUNTER — Emergency Department (HOSPITAL_COMMUNITY): Payer: Medicare Other

## 2015-09-28 ENCOUNTER — Inpatient Hospital Stay (HOSPITAL_COMMUNITY)
Admission: EM | Admit: 2015-09-28 | Discharge: 2015-10-07 | DRG: 186 | Disposition: A | Discharge status: Home Health | Payer: Medicare Other | Attending: Internal Medicine | Admitting: Internal Medicine

## 2015-09-28 ENCOUNTER — Encounter (HOSPITAL_COMMUNITY): Payer: Self-pay | Admitting: Emergency Medicine

## 2015-09-28 DIAGNOSIS — Z9889 Other specified postprocedural states: Secondary | ICD-10-CM

## 2015-09-28 DIAGNOSIS — K858 Other acute pancreatitis without necrosis or infection: Secondary | ICD-10-CM

## 2015-09-28 DIAGNOSIS — Z801 Family history of malignant neoplasm of trachea, bronchus and lung: Secondary | ICD-10-CM

## 2015-09-28 DIAGNOSIS — R739 Hyperglycemia, unspecified: Secondary | ICD-10-CM | POA: Diagnosis present

## 2015-09-28 DIAGNOSIS — N3090 Cystitis, unspecified without hematuria: Secondary | ICD-10-CM | POA: Diagnosis present

## 2015-09-28 DIAGNOSIS — R000 Tachycardia, unspecified: Secondary | ICD-10-CM

## 2015-09-28 DIAGNOSIS — Z90710 Acquired absence of both cervix and uterus: Secondary | ICD-10-CM

## 2015-09-28 DIAGNOSIS — J45909 Unspecified asthma, uncomplicated: Secondary | ICD-10-CM | POA: Diagnosis present

## 2015-09-28 DIAGNOSIS — F17210 Nicotine dependence, cigarettes, uncomplicated: Secondary | ICD-10-CM | POA: Diagnosis present

## 2015-09-28 DIAGNOSIS — J9621 Acute and chronic respiratory failure with hypoxia: Secondary | ICD-10-CM | POA: Diagnosis not present

## 2015-09-28 DIAGNOSIS — K8689 Other specified diseases of pancreas: Secondary | ICD-10-CM | POA: Diagnosis present

## 2015-09-28 DIAGNOSIS — J90 Pleural effusion, not elsewhere classified: Secondary | ICD-10-CM | POA: Diagnosis not present

## 2015-09-28 DIAGNOSIS — R0902 Hypoxemia: Secondary | ICD-10-CM

## 2015-09-28 DIAGNOSIS — Z7982 Long term (current) use of aspirin: Secondary | ICD-10-CM

## 2015-09-28 DIAGNOSIS — Z887 Allergy status to serum and vaccine status: Secondary | ICD-10-CM

## 2015-09-28 DIAGNOSIS — J939 Pneumothorax, unspecified: Secondary | ICD-10-CM

## 2015-09-28 DIAGNOSIS — J439 Emphysema, unspecified: Secondary | ICD-10-CM | POA: Diagnosis not present

## 2015-09-28 DIAGNOSIS — I129 Hypertensive chronic kidney disease with stage 1 through stage 4 chronic kidney disease, or unspecified chronic kidney disease: Secondary | ICD-10-CM | POA: Diagnosis present

## 2015-09-28 DIAGNOSIS — Z803 Family history of malignant neoplasm of breast: Secondary | ICD-10-CM

## 2015-09-28 DIAGNOSIS — K861 Other chronic pancreatitis: Secondary | ICD-10-CM

## 2015-09-28 DIAGNOSIS — J95811 Postprocedural pneumothorax: Secondary | ICD-10-CM

## 2015-09-28 DIAGNOSIS — K859 Acute pancreatitis without necrosis or infection, unspecified: Secondary | ICD-10-CM

## 2015-09-28 DIAGNOSIS — R1013 Epigastric pain: Secondary | ICD-10-CM | POA: Diagnosis not present

## 2015-09-28 DIAGNOSIS — Z885 Allergy status to narcotic agent status: Secondary | ICD-10-CM

## 2015-09-28 DIAGNOSIS — Z8249 Family history of ischemic heart disease and other diseases of the circulatory system: Secondary | ICD-10-CM

## 2015-09-28 DIAGNOSIS — Z888 Allergy status to other drugs, medicaments and biological substances status: Secondary | ICD-10-CM

## 2015-09-28 DIAGNOSIS — I10 Essential (primary) hypertension: Secondary | ICD-10-CM

## 2015-09-28 DIAGNOSIS — Z79899 Other long term (current) drug therapy: Secondary | ICD-10-CM

## 2015-09-28 DIAGNOSIS — D3502 Benign neoplasm of left adrenal gland: Secondary | ICD-10-CM | POA: Diagnosis present

## 2015-09-28 DIAGNOSIS — M3131 Wegener's granulomatosis with renal involvement: Secondary | ICD-10-CM | POA: Diagnosis present

## 2015-09-28 DIAGNOSIS — Z833 Family history of diabetes mellitus: Secondary | ICD-10-CM

## 2015-09-28 DIAGNOSIS — R0600 Dyspnea, unspecified: Secondary | ICD-10-CM

## 2015-09-28 DIAGNOSIS — R630 Anorexia: Secondary | ICD-10-CM | POA: Diagnosis present

## 2015-09-28 DIAGNOSIS — J984 Other disorders of lung: Secondary | ICD-10-CM

## 2015-09-28 DIAGNOSIS — M3130 Wegener's granulomatosis without renal involvement: Secondary | ICD-10-CM

## 2015-09-28 DIAGNOSIS — F413 Other mixed anxiety disorders: Secondary | ICD-10-CM | POA: Diagnosis present

## 2015-09-28 DIAGNOSIS — N189 Chronic kidney disease, unspecified: Secondary | ICD-10-CM | POA: Diagnosis present

## 2015-09-28 DIAGNOSIS — E876 Hypokalemia: Secondary | ICD-10-CM

## 2015-09-28 DIAGNOSIS — N390 Urinary tract infection, site not specified: Secondary | ICD-10-CM | POA: Diagnosis present

## 2015-09-28 DIAGNOSIS — K529 Noninfective gastroenteritis and colitis, unspecified: Secondary | ICD-10-CM | POA: Diagnosis present

## 2015-09-28 DIAGNOSIS — Z86010 Personal history of colonic polyps: Secondary | ICD-10-CM

## 2015-09-28 DIAGNOSIS — K863 Pseudocyst of pancreas: Secondary | ICD-10-CM

## 2015-09-28 DIAGNOSIS — J4520 Mild intermittent asthma, uncomplicated: Secondary | ICD-10-CM | POA: Diagnosis not present

## 2015-09-28 DIAGNOSIS — Z9049 Acquired absence of other specified parts of digestive tract: Secondary | ICD-10-CM

## 2015-09-28 DIAGNOSIS — Z87891 Personal history of nicotine dependence: Secondary | ICD-10-CM

## 2015-09-28 DIAGNOSIS — R918 Other nonspecific abnormal finding of lung field: Secondary | ICD-10-CM

## 2015-09-28 DIAGNOSIS — M8580 Other specified disorders of bone density and structure, unspecified site: Secondary | ICD-10-CM | POA: Diagnosis present

## 2015-09-28 DIAGNOSIS — J948 Other specified pleural conditions: Secondary | ICD-10-CM

## 2015-09-28 DIAGNOSIS — F329 Major depressive disorder, single episode, unspecified: Secondary | ICD-10-CM | POA: Diagnosis present

## 2015-09-28 DIAGNOSIS — Z881 Allergy status to other antibiotic agents status: Secondary | ICD-10-CM

## 2015-09-28 DIAGNOSIS — E785 Hyperlipidemia, unspecified: Secondary | ICD-10-CM | POA: Diagnosis present

## 2015-09-28 DIAGNOSIS — A599 Trichomoniasis, unspecified: Secondary | ICD-10-CM | POA: Diagnosis present

## 2015-09-28 DIAGNOSIS — I2510 Atherosclerotic heart disease of native coronary artery without angina pectoris: Secondary | ICD-10-CM | POA: Diagnosis present

## 2015-09-28 DIAGNOSIS — J449 Chronic obstructive pulmonary disease, unspecified: Secondary | ICD-10-CM | POA: Diagnosis present

## 2015-09-28 DIAGNOSIS — K219 Gastro-esophageal reflux disease without esophagitis: Secondary | ICD-10-CM | POA: Diagnosis present

## 2015-09-28 LAB — URINALYSIS, ROUTINE W REFLEX MICROSCOPIC
Bilirubin Urine: NEGATIVE
Glucose, UA: NEGATIVE mg/dL
KETONES UR: NEGATIVE mg/dL
Nitrite: NEGATIVE
PROTEIN: NEGATIVE mg/dL
Specific Gravity, Urine: 1.004 — ABNORMAL LOW (ref 1.005–1.030)
pH: 6.5 (ref 5.0–8.0)

## 2015-09-28 LAB — DIFFERENTIAL
Basophils Absolute: 0 10*3/uL (ref 0.0–0.1)
Basophils Relative: 0 %
Eosinophils Absolute: 0 10*3/uL (ref 0.0–0.7)
Eosinophils Relative: 0 %
LYMPHS PCT: 10 %
Lymphs Abs: 1.1 10*3/uL (ref 0.7–4.0)
Monocytes Absolute: 0.9 10*3/uL (ref 0.1–1.0)
Monocytes Relative: 8 %
Neutro Abs: 9.1 10*3/uL — ABNORMAL HIGH (ref 1.7–7.7)
Neutrophils Relative %: 82 %

## 2015-09-28 LAB — COMPREHENSIVE METABOLIC PANEL
ALK PHOS: 88 U/L (ref 38–126)
ALT: 54 U/L (ref 14–54)
ANION GAP: 8 (ref 5–15)
AST: 28 U/L (ref 15–41)
Albumin: 2.4 g/dL — ABNORMAL LOW (ref 3.5–5.0)
BUN: 9 mg/dL (ref 6–20)
CALCIUM: 8.3 mg/dL — AB (ref 8.9–10.3)
CO2: 25 mmol/L (ref 22–32)
Chloride: 103 mmol/L (ref 101–111)
Creatinine, Ser: 0.65 mg/dL (ref 0.44–1.00)
GFR calc non Af Amer: >60 mL/min (ref >60)
Glucose, Bld: 108 mg/dL — ABNORMAL HIGH (ref 65–99)
POTASSIUM: 3.3 mmol/L — AB (ref 3.5–5.1)
SODIUM: 136 mmol/L (ref 135–145)
TOTAL PROTEIN: 5.2 g/dL — AB (ref 6.5–8.1)
Total Bilirubin: 0.4 mg/dL (ref 0.3–1.2)

## 2015-09-28 LAB — CBC
HEMATOCRIT: 34.6 % — AB (ref 36.0–46.0)
HEMOGLOBIN: 12.3 g/dL (ref 12.0–15.0)
MCH: 33.2 pg (ref 26.0–34.0)
MCHC: 35.5 g/dL (ref 30.0–36.0)
MCV: 93.5 fL (ref 78.0–100.0)
Platelets: 194 10*3/uL (ref 150–400)
RBC: 3.7 MIL/uL — AB (ref 3.87–5.11)
RDW: 14.5 % (ref 11.5–15.5)
WBC: 11.2 10*3/uL — ABNORMAL HIGH (ref 4.0–10.5)

## 2015-09-28 LAB — LIPASE, BLOOD: LIPASE: 93 U/L — AB (ref 11–51)

## 2015-09-28 LAB — URINE MICROSCOPIC-ADD ON

## 2015-09-28 LAB — TYPE AND SCREEN
ABO/RH(D): A POS
ANTIBODY SCREEN: NEGATIVE

## 2015-09-28 LAB — I-STAT CG4 LACTIC ACID, ED: Lactic Acid, Venous: 1.62 mmol/L (ref 0.5–1.9)

## 2015-09-28 LAB — I-STAT TROPONIN, ED: TROPONIN I, POC: 0 ng/mL (ref 0.00–0.08)

## 2015-09-28 MED ORDER — DIAZEPAM 5 MG PO TABS
2.5000 mg | ORAL_TABLET | Freq: Two times a day (BID) | ORAL | Status: DC | PRN
  Administered 2015-09-29 – 2015-10-07 (×6): 5 mg via ORAL
  Filled 2015-09-28 (×8): qty 1

## 2015-09-28 MED ORDER — IPRATROPIUM BROMIDE 0.02 % IN SOLN: 0.5000 mg | Freq: Four times a day (QID) | RESPIRATORY_TRACT | Status: DC

## 2015-09-28 MED ORDER — POTASSIUM CHLORIDE 20 MEQ/15ML (10%) PO SOLN: 20.0000 meq | Freq: Once | ORAL | Status: DC

## 2015-09-28 MED ORDER — LEVALBUTEROL HCL 1.25 MG/0.5ML IN NEBU
1.2500 mg | INHALATION_SOLUTION | Freq: Two times a day (BID) | RESPIRATORY_TRACT | Status: DC
  Administered 2015-09-29 – 2015-10-05 (×13): 1.25 mg via RESPIRATORY_TRACT
  Filled 2015-09-28 (×15): qty 0.5

## 2015-09-28 MED ORDER — LOPERAMIDE HCL 2 MG PO CAPS: 2.0000 mg | ORAL_CAPSULE | ORAL | Status: DC | PRN

## 2015-09-28 MED ORDER — FLUCONAZOLE 100 MG PO TABS: 100.0000 mg | ORAL_TABLET | ORAL | Status: DC

## 2015-09-28 MED ORDER — METOPROLOL SUCCINATE ER 50 MG PO TB24
50.0000 mg | ORAL_TABLET | Freq: Every day | ORAL | Status: DC
  Administered 2015-09-29 – 2015-10-07 (×9): 50 mg via ORAL
  Filled 2015-09-28 (×10): qty 1

## 2015-09-28 MED ORDER — LEVALBUTEROL HCL 1.25 MG/0.5ML IN NEBU: 1.2500 mg | INHALATION_SOLUTION | Freq: Four times a day (QID) | RESPIRATORY_TRACT | Status: DC

## 2015-09-28 MED ORDER — LORATADINE 10 MG PO TABS
10.0000 mg | ORAL_TABLET | Freq: Every day | ORAL | Status: DC
  Administered 2015-09-30 – 2015-10-01 (×2): 10 mg via ORAL
  Filled 2015-09-28 (×6): qty 1

## 2015-09-28 MED ORDER — METOPROLOL SUCCINATE ER 25 MG PO TB24
25.0000 mg | ORAL_TABLET | Freq: Every day | ORAL | Status: DC
  Administered 2015-09-29 – 2015-10-06 (×8): 25 mg via ORAL
  Filled 2015-09-28 (×8): qty 1

## 2015-09-28 MED ORDER — ASPIRIN 81 MG PO CHEW
362.0000 mg | CHEWABLE_TABLET | Freq: Once | ORAL | Status: AC
Start: 2015-09-28 — End: 2015-09-29
  Administered 2015-09-29: 362 mg via ORAL
  Filled 2015-09-28: qty 5

## 2015-09-28 MED ORDER — DM-GUAIFENESIN ER 30-600 MG PO TB12
1.0000 | ORAL_TABLET | Freq: Two times a day (BID) | ORAL | Status: DC | PRN
  Filled 2015-09-28: qty 1

## 2015-09-28 MED ORDER — SIMETHICONE 80 MG PO CHEW
125.0000 mg | CHEWABLE_TABLET | Freq: Four times a day (QID) | ORAL | Status: DC | PRN
Start: 2015-09-28 — End: 2015-10-07
  Administered 2015-09-29 – 2015-10-07 (×24): 120 mg via ORAL
  Filled 2015-09-28 (×25): qty 2

## 2015-09-28 MED ORDER — HYDROMORPHONE HCL 1 MG/ML IJ SOLN
0.5000 mg | Freq: Once | INTRAMUSCULAR | Status: AC
  Administered 2015-09-28: 0.5 mg via INTRAVENOUS
  Filled 2015-09-28: qty 1

## 2015-09-28 MED ORDER — FOSFOMYCIN TROMETHAMINE 3 G PO PACK
3.0000 g | PACK | Freq: Once | ORAL | Status: DC
  Filled 2015-09-28: qty 3

## 2015-09-28 MED ORDER — OXYCODONE HCL 5 MG PO TABS
5.0000 mg | ORAL_TABLET | Freq: Four times a day (QID) | ORAL | Status: DC | PRN
  Administered 2015-09-29 (×3): 5 mg via ORAL
  Filled 2015-09-28 (×3): qty 1

## 2015-09-28 MED ORDER — PANTOPRAZOLE SODIUM 40 MG PO TBEC
40.0000 mg | DELAYED_RELEASE_TABLET | Freq: Every day | ORAL | Status: DC
  Administered 2015-09-29 – 2015-09-30 (×2): 40 mg via ORAL
  Filled 2015-09-28 (×2): qty 1

## 2015-09-28 MED ORDER — SODIUM CHLORIDE 0.9 % IV BOLUS (SEPSIS)
1000.0000 mL | Freq: Once | INTRAVENOUS | Status: AC
  Administered 2015-09-28: 1000 mL via INTRAVENOUS

## 2015-09-28 MED ORDER — ACETAMINOPHEN 325 MG PO TABS
650.0000 mg | ORAL_TABLET | Freq: Four times a day (QID) | ORAL | Status: DC | PRN
  Administered 2015-10-01 – 2015-10-07 (×15): 650 mg via ORAL
  Filled 2015-09-28 (×15): qty 2

## 2015-09-28 MED ORDER — ONDANSETRON HCL 4 MG/2ML IJ SOLN
4.0000 mg | Freq: Once | INTRAMUSCULAR | Status: AC
  Administered 2015-09-28: 4 mg via INTRAVENOUS
  Filled 2015-09-28: qty 2

## 2015-09-28 MED ORDER — MOMETASONE FURO-FORMOTEROL FUM 200-5 MCG/ACT IN AERO
2.0000 | INHALATION_SPRAY | Freq: Two times a day (BID) | RESPIRATORY_TRACT | Status: DC
  Administered 2015-09-29 – 2015-10-07 (×16): 2 via RESPIRATORY_TRACT
  Filled 2015-09-28: qty 8.8

## 2015-09-28 MED ORDER — SODIUM CHLORIDE 0.9 % IV SOLN
INTRAVENOUS | Status: DC
  Administered 2015-09-29: 01:00:00 via INTRAVENOUS
  Administered 2015-09-29: 100 mL via INTRAVENOUS
  Administered 2015-09-30 – 2015-10-07 (×6): via INTRAVENOUS

## 2015-09-28 MED ORDER — ONDANSETRON HCL 4 MG/2ML IJ SOLN
4.0000 mg | Freq: Three times a day (TID) | INTRAMUSCULAR | Status: DC | PRN
  Administered 2015-10-04 – 2015-10-06 (×3): 4 mg via INTRAVENOUS
  Filled 2015-09-28 (×4): qty 2

## 2015-09-28 MED ORDER — NICOTINE 21 MG/24HR TD PT24
21.0000 mg | MEDICATED_PATCH | Freq: Every day | TRANSDERMAL | Status: DC
  Filled 2015-09-28 (×5): qty 1

## 2015-09-28 MED ORDER — MELATONIN 1 MG PO TABS: 1.0000 mg | ORAL_TABLET | Freq: Every day | ORAL | Status: DC

## 2015-09-28 MED ORDER — IPRATROPIUM BROMIDE 0.02 % IN SOLN
0.5000 mg | Freq: Two times a day (BID) | RESPIRATORY_TRACT | Status: DC
  Administered 2015-09-29 – 2015-10-05 (×13): 0.5 mg via RESPIRATORY_TRACT
  Filled 2015-09-28 (×15): qty 2.5

## 2015-09-28 NOTE — ED Notes (Signed)
Pt transported from home by Thereasa Solo., pt states she initially called EMS d/t palpitations, chest tightness, pt states that feeling resolved now c/o upper abd pain, pt d/c yesterday from [facility] for pancreatitis. Pt states she did not get pain medication for home filled, stating she was scared to take pain medication for fear of getting nauseated. Pt lives alone.

## 2015-09-28 NOTE — ED Notes (Signed)
Pt to radiology via stretcher.  

## 2015-09-28 NOTE — ED Provider Notes (Signed)
CSN: [id number]     Arrival date & time 09/28/15  1915 History   First MD Initiated Contact with Patient 09/28/15 1918     Chief Complaint  Patient presents with  . Abdominal Pain     (Consider location/radiation/quality/duration/timing/severity/associated sxs/prior Treatment) HPI 70 year old female who presents with abdominal pain. She was just discharged from the hospital one day ago after management for pancreatitis with pseudocyst. She has a history of GPA on Rituxan and hereditary hemochromatosis. States that when she left the hospital she was having some persistent abdominal pain, which is mildly increased. She is not taking any of her pain medications today. Has just been feeling "weird." He has had decreased appetite but tolerating fluids. Has had nausea without vomiting. Denies melena, hematochezia, and reports chronic diarrhea. No fevers but having chills and night sweats since last night. Pain in the upper abdomen radiating into the chest, worse with movement. Past Medical History  Diagnosis Date  . Hereditary hemochromatosis ([place]) 2004  . ASTHMA   . HYPERTENSION   . GERD   . Chronic kidney disease   . Arthritis     left shoulder   . Headache(784.0)     sinus headaches   . H/O hiatal hernia   . Anxiety   . Hyperlipidemia   . CAD (coronary artery disease)   . Depression   . Hypertension   . Urinary tract infection   . COPD (chronic obstructive pulmonary disease) with emphysema ([place])     mild dz on PFTs 11/2013  . 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 Hx of adenomatous polyp of colon 07/25/2014  . Osteopenia 10/07/2014    DEXA @ [facility] 09/2014: -1.9 L fem  . Other mixed anxiety disorders 06/16/2015  . 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 PONV (postoperative nausea and vomiting)    Past Surgical History  Procedure Laterality Date  . Cholecystectomy  03/29/09  . Appendectomy   03/29/81  . Abdominal hysterectomy  03/29/81  . Bladder tack  03/29/2002  . Other surgical history      cyst removed from intestines to ovary    . Other surgical history      tendon surgery left wrist   . Cystoscopy  01/24/14    NEGATIVE;Dr Ottelin  . Colonoscopy w/ biopsies    . Esophagogastroduodenoscopy     Family History  Problem Relation Age of Onset  . Hypertension Mother   . Heart disease Mother   . Breast cancer Mother 60  . Diabetes Father   . Heart disease Father   . Alcohol abuse Other   . Arthritis Other   . Cirrhosis Brother   . Cirrhosis Brother   . Cirrhosis Brother   . Lung cancer Brother   . Colon cancer Neg Hx   . Colon polyps Neg Hx   . Esophageal cancer Neg Hx   . Rectal cancer Neg Hx   . Stomach cancer Neg Hx    Social History  Substance Use Topics  . Smoking status: Current Every Day Smoker -- 0.33 packs/day for 50 years    Types: Cigarettes    Last Attempt to Quit: 04/30/2014  . Smokeless tobacco: Never Used  . Alcohol Use: No   OB History    No data available     Review of Systems 10/14 systems reviewed and are negative other than those stated in the HPI    Allergies  Other; Ciprofloxacin; Codeine; Macrobid;  Morphine and related; and Prevnar  Home Medications   Prior to Admission medications   Medication Sig Start Date End Date Taking? Authorizing Provider  acetaminophen (TYLENOL) 500 MG tablet Take 500-1,000 mg by mouth every 8 (eight) hours as needed for mild pain or moderate pain.    Historical Provider, MD  albuterol (PROVENTIL) (2.5 MG/3ML) 0.083% nebulizer solution Take 3 mLs (2.5 mg total) by nebulization every 6 (six) hours as needed for wheezing or shortness of breath. 05/28/14   Hendricks Limes, MD  budesonide-formoterol [facility]) 160-4.5 MCG/ACT inhaler Inhale 2 puffs into the lungs 2 (two) times daily. Patient taking differently: Inhale 2 puffs into the lungs 2 (two) times daily as needed (For shortness of breath.).  07/30/15    Jose Shirl Harris, MD  diazepam (VALIUM) 5 MG tablet Take 0.5-1 tablets (2.5-5 mg total) by mouth every 12 (twelve) hours as needed for anxiety. 08/08/15   Biagio Borg, MD  fexofenadine (ALLEGRA) 180 MG tablet Take 180 mg by mouth at bedtime.     Historical Provider, MD  fluconazole (DIFLUCAN) 100 MG tablet Take 1 tablet (100 mg total) by mouth daily. 09/27/15   Barton Dubois, MD  fluticasone furoate-vilanterol (BREO ELLIPTA) 100-25 MCG/INH AEPB Inhale 1 puff into the lungs daily. 07/30/15   [place], MD  Melatonin 1 MG TABS Take 1 mg by mouth at bedtime.    Historical Provider, MD  metoprolol succinate (TOPROL XL) 25 MG 24 hr tablet Take 1 tablet (25 mg total) by mouth at bedtime. 09/19/15   Burtis Junes, NP  metoprolol succinate (TOPROL-XL) 50 MG 24 hr tablet Take 1 tablet (50 mg total) by mouth daily. Take with or immediately following a meal. 08/28/15   Rhonda G Barrett, PA-C  omeprazole (PRILOSEC) 20 MG capsule Take 1 capsule (20 mg total) by mouth 2 (two) times daily before a meal. 09/27/15   Barton Dubois, MD  ondansetron (ZOFRAN ODT) 8 MG disintegrating tablet Take 1 tablet (8 mg total) by mouth every 8 (eight) hours as needed for nausea or vomiting. 09/27/15   Barton Dubois, MD  oxyCODONE (OXY IR/ROXICODONE) 5 MG immediate release tablet Take 1 tablet (5 mg total) by mouth every 6 (six) hours as needed for severe pain. 09/27/15   Barton Dubois, MD  riTUXimab in sodium chloride 0.9 % 250 mL Inject into the vein See admin instructions. She is receiving 4 doses with Dr. Gavin Pound at [facility]. Her next dose is due on 09/24/15.    Historical Provider, MD   SpO2 96% Physical Exam Physical Exam  Nursing note and vitals reviewed. Constitutional: Anxious appearing, non-toxic, and in no acute distress Head: Normocephalic and atraumatic.  Mouth/Throat: Oropharynx is clear and moist.  Neck: Normal range of motion. Neck supple.  Cardiovascular: Tachycardic rate and regular  rhythm.   Pulmonary/Chest: Effort normal and breath sounds normal.  Abdominal: Soft. There is upper abdominal tenderness, worst in the epigastrium. There is no rebound and no guarding.  Musculoskeletal: Normal range of motion.  Neurological: Alert, no facial droop, fluent speech, moves all extremities symmetrically Skin: Skin is warm and dry.  Psychiatric: Cooperative  ED Course  Procedures (including critical care time) Labs Review Labs Reviewed  COMPREHENSIVE METABOLIC PANEL  CBC  DIFFERENTIAL  LIPASE, BLOOD  URINALYSIS, ROUTINE W REFLEX MICROSCOPIC (NOT AT [facility])  I-STAT CG4 LACTIC ACID, ED  I-STAT TROPOININ, ED  TYPE AND SCREEN    Imaging Review No results found. I have  personally reviewed and evaluated these images and lab results as part of my medical decision-making.   EKG Interpretation None      MDM   Final diagnoses:  None    Is tachycardic with heart rate in the 120s on arrival, appears dry and does have significant upper abdominal pain. Does not want to take any of her narcotic medications over the past 24 hours and I believe this is likely playing a role in her presentation today. She is afebrile and otherwise normotensive. Incidentally has a 2 L oxygen requirement with hypoxia on room air to 89%.  Blood work with minimal leukocytosis, mild hypokalemia, hypocalcemia which seems baseline. She has persistently elevated lipase of 93 which is similar to her lipase upon discharge. Her pain significantly improves after pain medication. Has some persistent tachycardia although improved after pain medications and IV fluids. Not suspecting worsening pancreatitis or infected pseudocyst.  X-ray does reveal worsening right-sided pleural effusion which is likely causing her hypoxia. Although pain improved, she still feels unwell. Will admit her to observation for further symptomati management. Discussed with Dr. Blaine Hamper.   Forde Dandy, MD 09/28/15 2228

## 2015-09-28 NOTE — ED Notes (Addendum)
Pt maintaining sat 89-90 on RA. Pt does not report lung dx history, denies SHOB, denies using home 02. Pt placed on 02 by [place] at 1.5L 02 quickly up to 95%

## 2015-09-28 NOTE — ED Notes (Signed)
Pt c/o nausea will hold PO meds at this time

## 2015-09-28 NOTE — H&P (Addendum)
History and Physical    Shandell Ruhland [id number] DOB: Feb 27, 1946 DOA: 09/28/2015  Referring MD/NP/PA:   PCP: [name], MD   Patient coming from:  The patient is coming from home.  At baseline, pt is independent for most of ADL.   Chief Complaint: Abdominal pain, shortness of breath, dysuria  HPI: Ambree Herson is a 70 y.o. female with medical history significant of COPD, asthma, tobacco abuse, hyperlipidemia, GERD, depression, anxiety, chronic diarrhea, CAD, granulomatosis with polyangiitis, recent admission due to acute pancreatitis with pseudocyst, just discharged yesterday, who presents with abdominal pain, SOB and dysuria.  Patient was recently hospitalized from 6/27 for 09/27/15 due to pancreatitis with pseudocyst formation. Per discharge summary, the pseudocyst was discussed with GI who saw no reason to pursue drainage at that time. Patient was discharged home in stable condition. She states that she did not get her pain medication refilled, and abdominal pain has worsened. It is located in the epigastric area, sharp, 10 out of 10 in severity, nonradiating. It is associated with nausea, but no vomiting. She is generalized weakness and poor appetite. She states that she has chronic diarrhea, which has not changed. She also reports shortness of breath, but she feels that her SOB is at her baseline. She has mild dry cough, but no chest pain, fever or chills. Patient completed course of Diflucan for yeast infection, but still has dysuria and burning on urination. She has palpitation, no rashes, hematemesis, hematochezia, hematuria or unilateral weakness.  ED Course: pt was found to have oxygen desaturation to 91% on room air, WBC 11.2, lactate 1.62, lipase is 93, negative troponin, positive urinalysis with moderate leukocytes, temperature normal, tachycardia, potassium 3.3, creatinine normal. Chest x-ray showed an increasing bilateral pleural effusions from exam 5 days prior, now  moderate in degree on the right, and small on the left. Associated right perihilar opacity may be secondary to compressive atelectasis. Pt is placed on telemetry bed for observation.  Review of Systems:   General: no fevers, chills, no changes in body weight, has poor appetite, has fatigue HEENT: no blurry vision, hearing changes or sore throat Pulm: has dyspnea, coughing, no wheezing CV: no chest pain, has palpitations Abd: has nausea, abdominal pain, diarrhea, no constipation and vomiting, GU: has dysuria, burning on urination, no increased urinary frequency, hematuria  Ext: no leg edema Neuro: no unilateral weakness, numbness, or tingling, no vision change or hearing loss Skin: no rash MSK: No muscle spasm, no deformity, no limitation of range of movement in spin Heme: No easy bruising.  Travel history: No recent long distant travel.  Allergy:  Allergies  Allergen Reactions  . Other Other (See Comments)    Please if giving pt anesthesia - she would like to have a scopoline patch to prevent nausea   . Ciprofloxacin Hives and Other (See Comments)    Blisters on legs  . Codeine Nausea And Vomiting and Other (See Comments)    Sweating, "passes out"  . Macrobid [employer] Macro] Other (See Comments)    Stool incontinence  . Morphine And Related Other (See Comments)    "hospital bed was shaking"  . Prevnar [Pneumococcal 13-Val Conj Vacc] Palpitations and Other (See Comments)    Pt c/o redness, swelling on arm after shot.  Pt c/o having respiratory problems and coughing ever since she got it.     Past Medical History  Diagnosis Date  . Hereditary hemochromatosis ([facility]) 2004  . ASTHMA   . HYPERTENSION   .  GERD   . Chronic kidney disease   . Arthritis     left shoulder   . Headache(784.0)     sinus headaches   . H/O hiatal hernia   . Anxiety   . Hyperlipidemia   . CAD (coronary artery disease)   . Depression   . Hypertension   . Urinary tract infection   .  COPD (chronic obstructive pulmonary disease) with emphysema ([facility])     mild dz on PFTs 11/2013  . 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 Hx of adenomatous polyp of colon 07/25/2014  . Osteopenia 10/07/2014    DEXA @ [facility] 09/2014: -1.9 L fem  . Other mixed anxiety disorders 06/16/2015  . 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 PONV (postoperative nausea and vomiting)     Past Surgical History  Procedure Laterality Date  . Cholecystectomy  03/29/09  . Appendectomy  03/29/81  . Abdominal hysterectomy  03/29/81  . Bladder tack  03/29/2002  . Other surgical history      cyst removed from intestines to ovary    . Other surgical history      tendon surgery left wrist   . Cystoscopy  01/24/14    NEGATIVE;Dr Ottelin  . Colonoscopy w/ biopsies    . Esophagogastroduodenoscopy      Social History:  reports that she has been smoking Cigarettes.  She has a 16.5 pack-year smoking history. She has never used smokeless tobacco. She reports that she does not drink alcohol or use illicit drugs.  Family History:  Family History  Problem Relation Age of Onset  . Hypertension Mother   . Heart disease Mother   . Breast cancer Mother 60  . Diabetes Father   . Heart disease Father   . Alcohol abuse Other   . Arthritis Other   . Cirrhosis Brother   . Cirrhosis Brother   . Cirrhosis Brother   . Lung cancer Brother   . Colon cancer Neg Hx   . Colon polyps Neg Hx   . Esophageal cancer Neg Hx   . Rectal cancer Neg Hx   . Stomach cancer Neg Hx      Prior to Admission medications   Medication Sig Start Date End Date Taking? Authorizing Provider  acetaminophen (TYLENOL) 500 MG tablet Take 500-1,000 mg by mouth every 8 (eight) hours as needed for mild pain or moderate pain.   Yes Historical Provider, MD  aspirin 81 MG tablet Take 362 mg by mouth once.   Yes Historical Provider, MD  budesonide-formoterol (SYMBICORT)  160-4.5 MCG/ACT inhaler Inhale 2 puffs into the lungs 2 (two) times daily. Patient taking differently: Inhale 2 puffs into the lungs 2 (two) times daily as needed (For shortness of breath.).  07/30/15  Yes Jose Angelo A Corrie Dandy, MD  fexofenadine (ALLEGRA) 180 MG tablet Take 180 mg by mouth at bedtime.    Yes Historical Provider, MD  loperamide (IMODIUM) 2 MG capsule Take 2 mg by mouth as needed for diarrhea or loose stools.   Yes Historical Provider, MD  Melatonin 1 MG TABS Take 1 mg by mouth at bedtime.   Yes Historical Provider, MD  metoprolol succinate (TOPROL XL) 25 MG 24 hr tablet Take 1 tablet (25 mg total) by mouth at bedtime. 09/19/15  Yes Burtis Junes, NP  metoprolol succinate (TOPROL-XL) 50 MG 24 hr tablet Take 1 tablet (50 mg total) by mouth daily. Take with or immediately  following a meal. 08/28/15  Yes Rhonda G Barrett, PA-C  omeprazole (PRILOSEC) 20 MG capsule Take 1 capsule (20 mg total) by mouth 2 (two) times daily before a meal. 09/27/15  Yes Barton Dubois, MD  ondansetron (ZOFRAN ODT) 8 MG disintegrating tablet Take 1 tablet (8 mg total) by mouth every 8 (eight) hours as needed for nausea or vomiting. 09/27/15  Yes Barton Dubois, MD  riTUXimab in sodium chloride 0.9 % 250 mL Inject into the vein See admin instructions. She is receiving 4 doses with Dr. Gavin Pound at [facility]. Her next dose is due on 09/24/15.   Yes Historical Provider, MD  simethicone (MYLICON) 0000000 MG chewable tablet Chew 125 mg by mouth every 6 (six) hours as needed for flatulence.   Yes Historical Provider, MD  sulfamethoxazole-trimethoprim (BACTRIM DS,SEPTRA DS) 800-160 MG tablet Take 1 tablet by mouth 3 (three) times a week. 09/08/15  Yes Historical Provider, MD  albuterol (PROVENTIL) (2.5 MG/3ML) 0.083% nebulizer solution Take 3 mLs (2.5 mg total) by nebulization every 6 (six) hours as needed for wheezing or shortness of breath. Patient not taking: Reported on 09/28/2015 05/28/14   Hendricks Limes, MD    diazepam (VALIUM) 5 MG tablet Take 0.5-1 tablets (2.5-5 mg total) by mouth every 12 (twelve) hours as needed for anxiety. Patient not taking: Reported on 09/28/2015 08/08/15   Biagio Borg, MD  fluconazole (DIFLUCAN) 100 MG tablet Take 1 tablet (100 mg total) by mouth daily. Patient not taking: Reported on 09/28/2015 09/27/15   Barton Dubois, MD  fluticasone furoate-vilanterol (BREO ELLIPTA) 100-25 MCG/INH AEPB Inhale 1 puff into the lungs daily. Patient not taking: Reported on 09/28/2015 07/30/15   Rush Landmark, MD  oxyCODONE (OXY IR/ROXICODONE) 5 MG immediate release tablet Take 1 tablet (5 mg total) by mouth every 6 (six) hours as needed for severe pain. Patient not taking: Reported on 09/28/2015 09/27/15   Barton Dubois, MD    Physical Exam: Filed Vitals:   09/28/15 2130 09/28/15 2200 09/28/15 2230 09/28/15 2300  BP: 123/67 116/63 127/68 121/71  Pulse: 101 99 99 97  Temp:      TempSrc:      Resp: 22 21 22 22   SpO2: 97% 97% 97% 97%   General: Not in acute distress HEENT:       Eyes: PERRL, EOMI, no scleral icterus.       ENT: No discharge from the ears and nose, no pharynx injection, no tonsillar enlargement.        Neck: No JVD, no bruit, no mass felt. Heme: No neck lymph node enlargement. Cardiac: S1/S2, RRR,Tachycardia, No murmurs, No gallops or rubs. Pulm:  No rales, wheezing, rhonchi or rubs. Abd: Soft, nondistended, has tenderness over epigastric area, no rebound pain, no organomegaly, BS present. GU: No hematuria Ext: No pitting leg edema bilaterally. 2+DP/PT pulse bilaterally. Musculoskeletal: No joint deformities, No joint redness or warmth, no limitation of ROM in spin. Skin: No rashes.  Neuro: Alert, oriented X3, cranial nerves II-XII grossly intact, moves all extremities normally. Psych: Patient is not psychotic, no suicidal or hemocidal ideation.  Labs on Admission: I have personally reviewed following labs and imaging studies  CBC:  Recent Labs Lab 09/23/15 0855  09/23/15 0904 09/24/15 0348 09/25/15 0517 09/26/15 0718 09/28/15 1934  WBC 13.4*  --  9.3 12.2* 8.5 11.2*  NEUTROABS 11.9*  --   --   --   --  9.1*  HGB 13.9 13.3 11.1* 12.5 11.4* 12.3  HCT 37.6 39.0 31.7* 35.8* 32.1* 34.6*  MCV 91.3  --  92.7 95.0 93.0 93.5  PLT 170  --  127* 162 137* [id number]   Basic Metabolic Panel:  Recent Labs Lab 09/23/15 0855  09/24/15 0348 09/25/15 0517 09/26/15 0718 09/27/15 0532 09/28/15 1934  NA 136  < > 136 135 134* 138 136  K 3.1*  < > 3.6 3.8 2.6* 4.0 3.3*  CL 100*  < > 107 106 103 106 103  CO2 26  --  23 21* 24 25 25   GLUCOSE 214*  < > 95 70 173* 118* 108*  BUN 13  < > 8 7 5* 5* 9  CREATININE 0.75  < > 0.60 0.49 0.42* 0.66 0.65  CALCIUM 8.8*  --  8.0* 8.3* 8.0* 8.3* 8.3*  MG 2.0  --   --  1.7  --   --   --   < > = values in this interval not displayed. GFR: Estimated Creatinine Clearance: 60.7 mL/min (by C-G formula based on Cr of 0.65). Liver Function Tests:  Recent Labs Lab 09/23/15 0855 09/24/15 0348 09/25/15 0517 09/27/15 0532 09/28/15 1934  AST 29  28 20 26 23 28   ALT 62*  59* 46 51 44 54  ALKPHOS 89  93 73 81 75 88  BILITOT 0.5  1.0 0.8 1.0 0.7 0.4  PROT 6.1*  6.2* 4.7* 5.5* 5.0* 5.2*  ALBUMIN 2.5*  2.8* 2.2* 2.4* 2.2* 2.4*    Recent Labs Lab 09/23/15 0855 09/25/15 0517 09/27/15 0532 09/28/15 1943  LIPASE 108* 94* 97* 93*   No results for input(s): AMMONIA in the last 168 hours. Coagulation Profile: No results for input(s): INR, PROTIME in the last 168 hours. Cardiac Enzymes: No results for input(s): CKTOTAL, CKMB, CKMBINDEX, TROPONINI in the last 168 hours. BNP (last 3 results) No results for input(s): PROBNP in the last 8760 hours. HbA1C: No results for input(s): HGBA1C in the last 72 hours. CBG:  Recent Labs Lab 09/24/15 0558 09/25/15 0508 09/26/15 0519 09/27/15 0516  GLUCAP 99 68 234* 136*   Lipid Profile: No results for input(s): CHOL, HDL, LDLCALC, TRIG, CHOLHDL, LDLDIRECT in the last 72  hours. Thyroid Function Tests: No results for input(s): TSH, T4TOTAL, FREET4, T3FREE, THYROIDAB in the last 72 hours. Anemia Panel: No results for input(s): VITAMINB12, FOLATE, FERRITIN, TIBC, IRON, RETICCTPCT in the last 72 hours. Urine analysis:    Component Value Date/Time   COLORURINE YELLOW 09/28/2015 1946   APPEARANCEUR CLEAR 09/28/2015 1946   LABSPEC 1.004* 09/28/2015 1946   PHURINE 6.5 09/28/2015 1946   GLUCOSEU NEGATIVE 09/28/2015 1946   GLUCOSEU NEGATIVE 07/23/2015 0901   HGBUR TRACE* 09/28/2015 1946   BILIRUBINUR NEGATIVE 09/28/2015 1946   BILIRUBINUR 3+ 05/21/2015 0932   KETONESUR NEGATIVE 09/28/2015 1946   PROTEINUR NEGATIVE 09/28/2015 1946   PROTEINUR 15 05/21/2015 0932   UROBILINOGEN 0.2 07/23/2015 0901   UROBILINOGEN 4.0 05/21/2015 0932   NITRITE NEGATIVE 09/28/2015 1946   NITRITE pos 05/21/2015 0932   LEUKOCYTESUR MODERATE* 09/28/2015 1946   Sepsis Labs: @LABRCNTIP (procalcitonin:4,lacticidven:4) )No results found for this or any previous visit (from the past 240 hour(s)).   Radiological Exams on Admission: Dg Chest 2 View  09/28/2015  CLINICAL DATA:  Low oxygen saturation. EXAM: CHEST  2 VIEW COMPARISON:  Chest radiographs 09/23/2015. Lung bases from CT abdomen 09/23/2015. FINDINGS: Increased size of right pleural effusion, now at least moderate in degree. Increased size of left pleural effusion, small. Increasing right perihilar opacity may be compressive  atelectasis secondary to pleural fluid. Cardiomediastinal contours are unchanged. No pneumothorax. No definite pulmonary edema. IMPRESSION: Increasing bilateral pleural effusions from exam 5 days prior, now moderate in degree on the right, and small on the left. Associated right perihilar opacity may be secondary to compressive atelectasis. Electronically Signed   By: Jeb Levering M.D.   On: 09/28/2015 21:02     EKG: Independently reviewed. Sinus rhythm, QTC 451, tachycardia, no ischemic  change.  Assessment/Plan Principal Problem:   Acute on chronic respiratory failure with hypoxia (HCC) Active Problems:   Hereditary hemochromatosis (HCC)   Essential hypertension   GERD   Asthma   CAD (coronary artery disease)   History of tobacco abuse   Hypokalemia   COPD (chronic obstructive pulmonary disease) with emphysema (HCC)   Other mixed anxiety disorders   Wegener's granulomatosis with renal involvement (HCC)   Pancreatitis   Pancreatic pseudocyst   Bilateral pleural effusion   UTI (lower urinary tract infection)   Acute on chronic respiratory failure with hypoxia [facility]): Patient reports shortness breath, but at her baseline per patient. Her oxygen saturation desaturated to 91% on room air, which responded to nasal cannula oxygen, currently 97%. This is likely multifactorial including worsening bilateral pleural effusion, COPD and asthma. Patient's lung is clear to auscultation, does not have exacerbation of COPD or asthma. Patient has mild leukocytosis, but no fever, no obvious infiltration on chest x-ray, less likely to have pneumonia.  -will place on tele bed for obs -Nebulizers: Atrovent and prn Xopenes Nebs -Dulera inhaler -Mucinex for cough  COPD and Asthma: no acute exacerbation. -See above  Bilateral pleural effusion: etiology is not clear, may be related to Wegener vasculitis -May consult to IR for thoracentesis in AM  Possible UTI: pt completed a course of Diflucan for yeast infection, but still has dysuria and burning on urination. Urinalysis is positive with moderate amount of leukocytes, indicating possible UTI. -Gave one dose of fosfomycin 3 g 1 -Follow-up urine culture  Pancreatitis with pseudocyst: lipase is 93. Her AP is due to lack of pain medication refill. Per discharge summary, lipid panel with TG of 229, etiology for pancreatitis was most likely due to use of prednisone and bactrim. The pseudocyst was discussed with GI who see no reason to  pursue drainage, unless cyst became obviously infected/febrile/WBC climbing or worsening pain. Hemochromatosis with normal ferritin, followed by Dr. [name].  -will need outpatient follow up with GI  -pain control: prn oxycodone -IVF: 1L NS, then 100 cc/h -NPO now  Tobacco abuse: -Did counseling about importance of quitting smoking -Nicotine patch  GERD: -Protonix  Anxiety: Stable, no suicidal or homicidal ideations. -Continue home medications: Valiium  HTN: bp 123/67. Pt has tachycardia, which is likely due to abdominal pain. -continue metoprolol  Hypokalemia: K= 3.3 on admission. - Repleted - Check Mg level  CAD (coronary artery disease): no chest pain -continue ASA and metoprolol  Granulomatosis with polyangiitis ([place]): Followed by Dr. [name] at [place]. Last Rituxan infusion was plan for 09/23/15. has Finished Medrol and Bactrim recently. -continue outpatient follow up with rheumatology    DVT ppx: SCD Code Status: Full code Family Communication: Yes, patient's  2 daughters  at bed side Disposition Plan:  Anticipate discharge back to previous home environment Consults called:  none Admission status: Obs / tele   Date of Service 09/28/2015    Ivor Costa [facility] Pager [phone]  If 7PM-7AM, please contact night-coverage [email or web address] Password [email or web address] 09/28/2015, 11:19 PM

## 2015-09-28 NOTE — ED Notes (Signed)
Bed: [id number] Expected date:  Expected time:  Means of arrival:  Comments: EMS: Dx pancreatitis 2 days ago

## 2015-09-28 NOTE — Progress Notes (Signed)
PHARMACIST - PHYSICIAN ORDER COMMUNICATION  CONCERNING: P&T Medication Policy on Herbal Medications  DESCRIPTION:  This patient's order for:  Melatonin  has been noted.  This product(s) is classified as an "herbal" or natural product. Due to a lack of definitive safety studies or FDA approval, nonstandard manufacturing practices, plus the potential risk of unknown drug-drug interactions while on inpatient medications, the Pharmacy and Therapeutics Committee does not permit the use of "herbal" or natural products of this type within [facility].   ACTION TAKEN: The pharmacy department is unable to verify this order at this time and your patient has been informed of this safety policy. Please reevaluate patient's clinical condition at discharge and address if the herbal or natural product(s) should be resumed at that time.  Carlita Whitcomb, PharmD  

## 2015-09-29 ENCOUNTER — Encounter (HOSPITAL_COMMUNITY): Payer: Self-pay

## 2015-09-29 ENCOUNTER — Observation Stay (HOSPITAL_COMMUNITY): Payer: Medicare Other

## 2015-09-29 DIAGNOSIS — J984 Other disorders of lung: Secondary | ICD-10-CM | POA: Diagnosis not present

## 2015-09-29 DIAGNOSIS — K219 Gastro-esophageal reflux disease without esophagitis: Secondary | ICD-10-CM | POA: Diagnosis present

## 2015-09-29 DIAGNOSIS — I2510 Atherosclerotic heart disease of native coronary artery without angina pectoris: Secondary | ICD-10-CM | POA: Diagnosis present

## 2015-09-29 DIAGNOSIS — J449 Chronic obstructive pulmonary disease, unspecified: Secondary | ICD-10-CM | POA: Diagnosis present

## 2015-09-29 DIAGNOSIS — F329 Major depressive disorder, single episode, unspecified: Secondary | ICD-10-CM | POA: Diagnosis present

## 2015-09-29 DIAGNOSIS — J939 Pneumothorax, unspecified: Secondary | ICD-10-CM | POA: Diagnosis not present

## 2015-09-29 DIAGNOSIS — N3090 Cystitis, unspecified without hematuria: Secondary | ICD-10-CM | POA: Diagnosis present

## 2015-09-29 DIAGNOSIS — N189 Chronic kidney disease, unspecified: Secondary | ICD-10-CM | POA: Diagnosis present

## 2015-09-29 DIAGNOSIS — Z90710 Acquired absence of both cervix and uterus: Secondary | ICD-10-CM | POA: Diagnosis not present

## 2015-09-29 DIAGNOSIS — J9621 Acute and chronic respiratory failure with hypoxia: Secondary | ICD-10-CM | POA: Diagnosis present

## 2015-09-29 DIAGNOSIS — Z888 Allergy status to other drugs, medicaments and biological substances status: Secondary | ICD-10-CM | POA: Diagnosis not present

## 2015-09-29 DIAGNOSIS — D3502 Benign neoplasm of left adrenal gland: Secondary | ICD-10-CM | POA: Diagnosis present

## 2015-09-29 DIAGNOSIS — K529 Noninfective gastroenteritis and colitis, unspecified: Secondary | ICD-10-CM | POA: Diagnosis present

## 2015-09-29 DIAGNOSIS — J90 Pleural effusion, not elsewhere classified: Secondary | ICD-10-CM | POA: Diagnosis present

## 2015-09-29 DIAGNOSIS — F413 Other mixed anxiety disorders: Secondary | ICD-10-CM | POA: Diagnosis present

## 2015-09-29 DIAGNOSIS — J948 Other specified pleural conditions: Secondary | ICD-10-CM | POA: Diagnosis present

## 2015-09-29 DIAGNOSIS — Z79899 Other long term (current) drug therapy: Secondary | ICD-10-CM | POA: Diagnosis not present

## 2015-09-29 DIAGNOSIS — Z86010 Personal history of colonic polyps: Secondary | ICD-10-CM | POA: Diagnosis not present

## 2015-09-29 DIAGNOSIS — M3130 Wegener's granulomatosis without renal involvement: Secondary | ICD-10-CM | POA: Diagnosis not present

## 2015-09-29 DIAGNOSIS — K859 Acute pancreatitis without necrosis or infection, unspecified: Secondary | ICD-10-CM | POA: Diagnosis present

## 2015-09-29 DIAGNOSIS — Z8249 Family history of ischemic heart disease and other diseases of the circulatory system: Secondary | ICD-10-CM | POA: Diagnosis not present

## 2015-09-29 DIAGNOSIS — F17210 Nicotine dependence, cigarettes, uncomplicated: Secondary | ICD-10-CM | POA: Diagnosis present

## 2015-09-29 DIAGNOSIS — M8580 Other specified disorders of bone density and structure, unspecified site: Secondary | ICD-10-CM | POA: Diagnosis present

## 2015-09-29 DIAGNOSIS — K861 Other chronic pancreatitis: Secondary | ICD-10-CM | POA: Diagnosis present

## 2015-09-29 DIAGNOSIS — K858 Other acute pancreatitis without necrosis or infection: Secondary | ICD-10-CM | POA: Diagnosis not present

## 2015-09-29 DIAGNOSIS — E785 Hyperlipidemia, unspecified: Secondary | ICD-10-CM | POA: Diagnosis present

## 2015-09-29 DIAGNOSIS — R630 Anorexia: Secondary | ICD-10-CM | POA: Diagnosis present

## 2015-09-29 DIAGNOSIS — M3131 Wegener's granulomatosis with renal involvement: Secondary | ICD-10-CM

## 2015-09-29 DIAGNOSIS — R1013 Epigastric pain: Secondary | ICD-10-CM | POA: Diagnosis present

## 2015-09-29 DIAGNOSIS — Z881 Allergy status to other antibiotic agents status: Secondary | ICD-10-CM | POA: Diagnosis not present

## 2015-09-29 DIAGNOSIS — Z887 Allergy status to serum and vaccine status: Secondary | ICD-10-CM | POA: Diagnosis not present

## 2015-09-29 DIAGNOSIS — E876 Hypokalemia: Secondary | ICD-10-CM | POA: Diagnosis present

## 2015-09-29 DIAGNOSIS — I129 Hypertensive chronic kidney disease with stage 1 through stage 4 chronic kidney disease, or unspecified chronic kidney disease: Secondary | ICD-10-CM | POA: Diagnosis present

## 2015-09-29 DIAGNOSIS — I10 Essential (primary) hypertension: Secondary | ICD-10-CM | POA: Diagnosis not present

## 2015-09-29 DIAGNOSIS — J4520 Mild intermittent asthma, uncomplicated: Secondary | ICD-10-CM | POA: Diagnosis not present

## 2015-09-29 DIAGNOSIS — Z803 Family history of malignant neoplasm of breast: Secondary | ICD-10-CM | POA: Diagnosis not present

## 2015-09-29 DIAGNOSIS — Z833 Family history of diabetes mellitus: Secondary | ICD-10-CM | POA: Diagnosis not present

## 2015-09-29 DIAGNOSIS — Z801 Family history of malignant neoplasm of trachea, bronchus and lung: Secondary | ICD-10-CM | POA: Diagnosis not present

## 2015-09-29 DIAGNOSIS — Z885 Allergy status to narcotic agent status: Secondary | ICD-10-CM | POA: Diagnosis not present

## 2015-09-29 DIAGNOSIS — K863 Pseudocyst of pancreas: Secondary | ICD-10-CM | POA: Diagnosis present

## 2015-09-29 DIAGNOSIS — A599 Trichomoniasis, unspecified: Secondary | ICD-10-CM | POA: Diagnosis present

## 2015-09-29 DIAGNOSIS — R739 Hyperglycemia, unspecified: Secondary | ICD-10-CM | POA: Diagnosis present

## 2015-09-29 DIAGNOSIS — R000 Tachycardia, unspecified: Secondary | ICD-10-CM | POA: Diagnosis present

## 2015-09-29 DIAGNOSIS — J439 Emphysema, unspecified: Secondary | ICD-10-CM | POA: Diagnosis not present

## 2015-09-29 DIAGNOSIS — Z9049 Acquired absence of other specified parts of digestive tract: Secondary | ICD-10-CM | POA: Diagnosis not present

## 2015-09-29 DIAGNOSIS — Z7982 Long term (current) use of aspirin: Secondary | ICD-10-CM | POA: Diagnosis not present

## 2015-09-29 LAB — CBC
HEMATOCRIT: 32.3 % — AB (ref 36.0–46.0)
Hemoglobin: 11.1 g/dL — ABNORMAL LOW (ref 12.0–15.0)
MCH: 32.7 pg (ref 26.0–34.0)
MCHC: 34.4 g/dL (ref 30.0–36.0)
MCV: 95.3 fL (ref 78.0–100.0)
PLATELETS: 174 10*3/uL (ref 150–400)
RBC: 3.39 MIL/uL — ABNORMAL LOW (ref 3.87–5.11)
RDW: 14.9 % (ref 11.5–15.5)
WBC: 9 10*3/uL (ref 4.0–10.5)

## 2015-09-29 LAB — URINE MICROSCOPIC-ADD ON

## 2015-09-29 LAB — GLUCOSE, CAPILLARY: Glucose-Capillary: 128 mg/dL — ABNORMAL HIGH (ref 65–99)

## 2015-09-29 LAB — LACTATE DEHYDROGENASE, PLEURAL OR PERITONEAL FLUID: LD FL: 403 U/L — AB (ref 3–23)

## 2015-09-29 LAB — GRAM STAIN

## 2015-09-29 LAB — URINALYSIS, ROUTINE W REFLEX MICROSCOPIC
Bilirubin Urine: NEGATIVE
GLUCOSE, UA: NEGATIVE mg/dL
Ketones, ur: NEGATIVE mg/dL
NITRITE: NEGATIVE
PH: 5.5 (ref 5.0–8.0)
Protein, ur: NEGATIVE mg/dL
SPECIFIC GRAVITY, URINE: 1.007 (ref 1.005–1.030)

## 2015-09-29 LAB — GLUCOSE, SEROUS FLUID: Glucose, Fluid: 100 mg/dL

## 2015-09-29 LAB — BODY FLUID CELL COUNT WITH DIFFERENTIAL
Eos, Fluid: 1 %
LYMPHS FL: 40 %
MONOCYTE-MACROPHAGE-SEROUS FLUID: 23 % — AB (ref 50–90)
NEUTROPHIL FLUID: 36 % — AB (ref 0–25)
WBC FLUID: 341 uL (ref 0–1000)

## 2015-09-29 LAB — BASIC METABOLIC PANEL
ANION GAP: 6 (ref 5–15)
BUN: 8 mg/dL (ref 6–20)
CHLORIDE: 104 mmol/L (ref 101–111)
CO2: 28 mmol/L (ref 22–32)
Calcium: 8 mg/dL — ABNORMAL LOW (ref 8.9–10.3)
Creatinine, Ser: 0.69 mg/dL (ref 0.44–1.00)
GFR calc non Af Amer: >60 mL/min (ref >60)
Glucose, Bld: 123 mg/dL — ABNORMAL HIGH (ref 65–99)
POTASSIUM: 3.1 mmol/L — AB (ref 3.5–5.1)
SODIUM: 138 mmol/L (ref 135–145)

## 2015-09-29 LAB — ABO/RH: ABO/RH(D): A POS

## 2015-09-29 LAB — PROTIME-INR
INR: 1.38 (ref 0.00–1.49)
Prothrombin Time: 16.5 seconds — ABNORMAL HIGH (ref 11.6–15.2)

## 2015-09-29 LAB — PROTEIN, BODY FLUID: Total protein, fluid: <3 g/dL

## 2015-09-29 LAB — MAGNESIUM: Magnesium: 1.8 mg/dL (ref 1.7–2.4)

## 2015-09-29 LAB — APTT: aPTT: 37 seconds (ref 24–37)

## 2015-09-29 MED ORDER — FOSFOMYCIN TROMETHAMINE 3 G PO PACK
3.0000 g | PACK | Freq: Once | ORAL | Status: AC
  Administered 2015-09-29: 3 g via ORAL
  Filled 2015-09-29: qty 3

## 2015-09-29 MED ORDER — POTASSIUM CHLORIDE CRYS ER 20 MEQ PO TBCR
40.0000 meq | EXTENDED_RELEASE_TABLET | ORAL | Status: AC
  Administered 2015-09-29 (×2): 40 meq via ORAL
  Filled 2015-09-29 (×2): qty 2

## 2015-09-29 MED ORDER — DIATRIZOATE MEGLUMINE & SODIUM 66-10 % PO SOLN
30.0000 mL | Freq: Once | ORAL | Status: DC
  Filled 2015-09-29: qty 30

## 2015-09-29 MED ORDER — IOPAMIDOL (ISOVUE-300) INJECTION 61%
100.0000 mL | Freq: Once | INTRAVENOUS | Status: AC | PRN
  Administered 2015-09-29: 100 mL via INTRAVENOUS

## 2015-09-29 MED ORDER — MAGNESIUM SULFATE 2 GM/50ML IV SOLN
2.0000 g | Freq: Once | INTRAVENOUS | Status: AC
  Administered 2015-09-29: 2 g via INTRAVENOUS
  Filled 2015-09-29: qty 50

## 2015-09-29 NOTE — Care Management Obs Status (Signed)
[place] NOTIFICATION   Patient Details  Name: [name] [name] MRN: [id number] Date of Birth: [birth year]   Medicare Observation Status Notification Given:  Yes    Purcell Mouton, RN 09/29/2015, 3:56 PM

## 2015-09-29 NOTE — Progress Notes (Signed)
OT Cancellation Note  Patient Details Name: [name] [name] MRN: [id number] DOB: 03/08/1946   Cancelled Treatment:    Reason Eval/Treat Not Completed: Fatigue/lethargy limiting ability to participate  Pt requested OT return next day.  Kari Baars, [place]  Payton Mccallum D 09/29/2015, 2:54 PM

## 2015-09-29 NOTE — Evaluation (Signed)
Physical Therapy Evaluation Patient Details Name: [name] [name] MRN: [id number] DOB: March 25, 1946 Today's Date: 09/29/2015   History of Present Illness  70 y.o. female with medical history significant of COPD, asthma, tobacco abuse, hyperlipidemia, GERD, depression, anxiety, chronic diarrhea, CAD, granulomatosis with polyangiitis, recent admission due to acute pancreatitis with pseudocyst and admitted 09/28/15 for acute on chronic respiratory failure with hypoxia  Clinical Impression  Pt admitted with above diagnosis. Pt currently with functional limitations due to the deficits listed below (see PT Problem List).  Pt will benefit from skilled PT to increase their independence and safety with mobility to allow discharge to the venue listed below.  Pt reports chronic back and hip pain from OA and observed improved gait pattern with use of RW.       Follow Up Recommendations Home health PT    Equipment Recommendations  Rolling walker with 5" wheels    Recommendations for Other Services       Precautions / Restrictions Precautions Precaution Comments: monitor sats Restrictions Weight Bearing Restrictions: No      Mobility  Bed Mobility Overal bed mobility: Modified Independent                Transfers Overall transfer level: Needs assistance Equipment used: None Transfers: Sit to/from Stand Sit to Stand: Min guard         General transfer comment: min/guard for safety  Ambulation/Gait Ambulation/Gait assistance: Min guard Ambulation Distance (Feet): 60 Feet Assistive device: Rolling walker (2 wheeled) Gait Pattern/deviations: Step-through pattern;Antalgic     General Gait Details: antalgic gait due to hip OA per pt so provided RW with improved gait observed, SpO2 88-92% on room air however dropped briefly to 87% upon return to bed and reapplied 2L O2 ,  dyspnea 2/4  Stairs            Wheelchair Mobility    Modified Rankin (Stroke Patients Only)        Balance Overall balance assessment:  (denies falls)                                           Pertinent Vitals/Pain Pain Assessment: 0-10 Pain Score:  (reports chronic, did not rate) Pain Location: L hip and back due to OA per pt Pain Descriptors / Indicators: Discomfort Pain Intervention(s): Limited activity within patient's tolerance;Monitored during session    Home Living Family/patient expects to be discharged to:: Private residence Living Arrangements: Alone Available Help at Discharge: Family;Available PRN/intermittently (daughters) Type of Home: House       Home Layout: Able to live on main level with bedroom/bathroom Home Equipment: None      Prior Function Level of Independence: Independent               Hand Dominance        Extremity/Trunk Assessment               Lower Extremity Assessment: Generalized weakness         Communication   Communication: No difficulties  Cognition Arousal/Alertness: Awake/alert Behavior During Therapy: WFL for tasks assessed/performed Overall Cognitive Status: Within Functional Limits for tasks assessed                      General Comments      Exercises        Assessment/Plan    PT Assessment Patient  needs continued PT services  PT Diagnosis Difficulty walking   PT Problem List Decreased strength;Decreased activity tolerance;Decreased mobility;Pain;Cardiopulmonary status limiting activity  PT Treatment Interventions DME instruction;Gait training;Functional mobility training;Patient/family education;Therapeutic activities;Therapeutic exercise   PT Goals (Current goals can be found in the Care Plan section) Acute Rehab PT Goals PT Goal Formulation: With patient Time For Goal Achievement: 10/06/15 Potential to Achieve Goals: Good    Frequency Min 3X/week   Barriers to discharge        Co-evaluation               End of Session Equipment Utilized During  Treatment: Oxygen Activity Tolerance: Patient limited by fatigue Patient left: in bed;with call bell/phone within reach Nurse Communication: Mobility status    Functional Assessment Tool Used: clinical judgement Functional Limitation: Mobility: Walking and moving around Mobility: Walking and Moving Around Current Status [id number]): At least 1 percent but less than 20 percent impaired, limited or restricted Mobility: Walking and Moving Around Goal Status [phone]): At least 1 percent but less than 20 percent impaired, limited or restricted    Time: 0907-0921 PT Time Calculation (min) (ACUTE ONLY): 14 min   Charges:   PT Evaluation $PT Eval Low Complexity: 1 Procedure     PT G Codes:   PT G-Codes **NOT FOR INPATIENT CLASS** Functional Assessment Tool Used: clinical judgement Functional Limitation: Mobility: Walking and moving around Mobility: Walking and Moving Around Current Status [id number]): At least 1 percent but less than 20 percent impaired, limited or restricted Mobility: Walking and Moving Around Goal Status [phone]): At least 1 percent but less than 20 percent impaired, limited or restricted    Ike Maragh,KATHrine E 09/29/2015, 11:41 AM Carmelia Bake, PT, DPT 09/29/2015 Pager: [phone]

## 2015-09-29 NOTE — Procedures (Signed)
Ultrasound-guided diagnostic and therapeutic right thoracentesis performed yielding 1.4 liters of yellow fluid. No immediate complications. Follow-up chest x-ray pending. The fluid was sent to the lab for preordered studies.        

## 2015-09-29 NOTE — Progress Notes (Signed)
[facility] PROGRESS NOTE  [name] [name] [id number] DOB: Aug 09, 1945 DOA: 09/28/2015 PCP: [name], MD  Interim summary and HPI 70 y.o. female with medical history significant of COPD, asthma, tobacco abuse, hyperlipidemia, GERD, depression, anxiety, chronic diarrhea, CAD, granulomatosis with polyangiitis, recent admission due to acute pancreatitis with pseudocyst, just discharged yesterday, who presents with continue abdominal pain, SOB and dysuria.  Patient was recently hospitalized from 6/27 for 09/27/15 due to pancreatitis with pseudocyst formation. Per discharge summary, the pseudocyst was discussed with GI who saw no reason to pursue drainage at that time. Patient was discharged home in stable condition. She states that she did not get her pain medication refilled, and abdominal pain has worsened. It is located in the epigastric area, sharp, 10 out of 10 in severity, nonradiating. It is associated with nausea, but no vomiting. She is generalized weakness and poor appetite. She states that she has chronic diarrhea, which has not changed. She also reports shortness of breath, but she feels that her SOB is at her baseline. She has mild dry cough, but no chest pain, fever or chills. Patient completed course of Diflucan for yeast infection, but still has dysuria and burning on urination. She has palpitation, no rashes, hematemesis, hematochezia, hematuria or unilateral weakness.  Assessment/Plan: 1. Pancreatitis with pseudocyst and pleural effusion:  -The pseudocyst appears slightly smaller; even there is other smaller ones affecting the tail of the pancreas. was discussed with GI who see no reason to pursue drainage at this point.  -Lipase remains stable -continue supportive care and slowly continue advancing diet -continue IVF's (rate adjusted) and PRN analgesics -last evaluation of lipid panel with TG of 229 -etiology for pancreatitis most likely due to prolong use of prednisone  and bactrim -continue flutter valve/IS -will follow clinical response; home in the next 24-48 hours hopefully. -will have thoracentesis of right pleural effusion (anticipate transudate from pancreatitis process and IVF's resuscitation on prior admission)   2. Granulomatosis with polyangiitis ([place]):  Followed by Dr. [name] at [facility]. Last Rituxan infusion was plan for 09/23/15. -Family will call to reschedule Rituxan for after discharge. -has Finish Medrol with 4 mg today; will follow VS and needs for further steroids -has also finished Bactrim (was on for prophylaxis while on steroids)  -continue outpatient follow up with rheumatology service at discharge  3. Hypokalemia:  -will replete as needed (most likely from diarrhea) -Mg WNL  4. HTN and hx non-obs CAD:  -Continue metoprolol and aspirin -no CP reported   5. Hx of emphysema:  -Continue Breo daily -Smoking cessation encouraged/advised  -no wheezing  6. Depression/anxiety:  -Continue diazepam PRN -no SI or hallucinations   7. Smoking:  -Cessation counseling provided -patient looking to quit  8. Hyperglycemia: -From prednisone and pancreatitis -will continue Daily CBG and SSI only if needed (CBG's > 200) -A1C 6.5 from last admission   9. Leukocytosis: -resolved and WNL now -will monitor   10. Tachycardia: -Likely from pain -will monitor for 24 hours on telemetry -continue B-blocker   11. Dysuria: -will follow urine cx -received fosfomycin on admission  -UA no suggesting UTI -probably cystitis and/or vaginal atrophy as cause for her symptoms   Code Status: Full Family Communication: no family at bedside Disposition Plan: continue supportive care, IVF's and pain meds. Will attempt right thoracentesis (given SOB on exertion and hypoxia; moderate effusion appreciated).     Consultants:  GI (Dr. Henrene Pastor curbside by myself after repeating CT abd/pelvis. No further recommendations or work up  needed at  this moment) Continue supportive care for pancreatitis  Procedures:  See below for x-ray reports   Antibiotics:  None   HPI/Subjective: Feeling better from pain stand point after using analgesics. No vomiting, but reports PRN nausea. Also with SOB with activity. No CP.  Objective: Filed Vitals:   09/29/15 1234 09/29/15 1448  BP: 120/74 119/64  Pulse: 96 103  Temp:  98.1 F (36.7 C)  Resp:  20    Intake/Output Summary (Last 24 hours) at 09/29/15 1530 Last data filed at 09/29/15 0700  Gross per 24 hour  Intake    645 ml  Output      0 ml  Net    645 ml   Filed Weights   09/28/15 2353  Weight: 69.5 kg (153 lb 3.5 oz)    Exam:   General:  Afebrile. No CP. Reports SOB on exertion and was found to have mild desaturation after finishing Physical therapy assessment. No further vomiting, but continue experiencing intermittent nausea. Still with abd pain, but improved (after using pain medications). Reports still having diarrhea.   Cardiovascular: S1 and S2, mild sinus tachycardia, no rubs, no gallops  Respiratory: no wheezing or crackles on exam; decrease BS at bases (R > L)  Abdomen: soft, mild to moderate tenderness to palpation in mid epigastric area and RUQ/LUQ; positive BS. Last BM on 6/30 (diarrhea)  Musculoskeletal: no edema, no cyanosis   Data Reviewed: Basic Metabolic Panel:  Recent Labs Lab 09/23/15 0855  09/25/15 0517 09/26/15 0718 09/27/15 0532 09/28/15 1934 09/29/15 0431  NA 136  < > 135 134* 138 136 138  K 3.1*  < > 3.8 2.6* 4.0 3.3* 3.1*  CL 100*  < > 106 103 106 103 104  CO2 26  < > 21* 24 25 25 28   GLUCOSE 214*  < > 70 173* 118* 108* 123*  BUN 13  < > 7 5* 5* 9 8  CREATININE 0.75  < > 0.49 0.42* 0.66 0.65 0.69  CALCIUM 8.8*  < > 8.3* 8.0* 8.3* 8.3* 8.0*  MG 2.0  --  1.7  --   --   --  1.8  < > = values in this interval not displayed. Liver Function Tests:  Recent Labs Lab 09/23/15 0855 09/24/15 0348 09/25/15 0517 09/27/15 0532  09/28/15 1934  AST 29  28 20 26 23 28   ALT 62*  59* 46 51 44 54  ALKPHOS 89  93 73 81 75 88  BILITOT 0.5  1.0 0.8 1.0 0.7 0.4  PROT 6.1*  6.2* 4.7* 5.5* 5.0* 5.2*  ALBUMIN 2.5*  2.8* 2.2* 2.4* 2.2* 2.4*    Recent Labs Lab 09/23/15 0855 09/25/15 0517 09/27/15 0532 09/28/15 1943  LIPASE 108* 94* 97* 93*   CBC:  Recent Labs Lab 09/23/15 0855  09/24/15 0348 09/25/15 0517 09/26/15 0718 09/28/15 1934 09/29/15 0431  WBC 13.4*  --  9.3 12.2* 8.5 11.2* 9.0  NEUTROABS 11.9*  --   --   --   --  9.1*  --   HGB 13.9  < > 11.1* 12.5 11.4* 12.3 11.1*  HCT 37.6  < > 31.7* 35.8* 32.1* 34.6* 32.3*  MCV 91.3  --  92.7 95.0 93.0 93.5 95.3  PLT 170  --  127* 162 137* 194 174  < > = values in this interval not displayed. CBG:  Recent Labs Lab 09/24/15 0558 09/25/15 0508 09/26/15 0519 09/27/15 0516 09/29/15 0809  GLUCAP 99 68 234* 136* 128*  Studies: Dg Chest 2 View  09/28/2015  CLINICAL DATA:  Low oxygen saturation. EXAM: CHEST  2 VIEW COMPARISON:  Chest radiographs 09/23/2015. Lung bases from CT abdomen 09/23/2015. FINDINGS: Increased size of right pleural effusion, now at least moderate in degree. Increased size of left pleural effusion, small. Increasing right perihilar opacity may be compressive atelectasis secondary to pleural fluid. Cardiomediastinal contours are unchanged. No pneumothorax. No definite pulmonary edema. IMPRESSION: Increasing bilateral pleural effusions from exam 5 days prior, now moderate in degree on the right, and small on the left. Associated right perihilar opacity may be secondary to compressive atelectasis. Electronically Signed   By: Jeb Levering M.D.   On: 09/28/2015 21:02   Ct Abdomen Pelvis W Contrast  09/29/2015  CLINICAL DATA:  Abdominal pain with nausea and vomiting EXAM: CT ABDOMEN AND PELVIS WITH CONTRAST TECHNIQUE: Multidetector CT imaging of the abdomen and pelvis was performed using the standard protocol following bolus administration of  intravenous contrast. CONTRAST:  18mL ISOVUE-300 IOPAMIDOL (ISOVUE-300) INJECTION 61% COMPARISON:  09/23/2015 FINDINGS: Lower chest: Bilateral pleural effusions are noted right considerably greater than left increased from the prior exam. Right lower lobe consolidation is noted as well. The previously seen right cavitary lesion is obscured by the consolidation. Mild left basilar atelectasis is noted as well. Hepatobiliary: Scattered hypodensities are noted throughout the liver consistent with hepatic cysts. The gallbladder has been surgically removed. Pancreas: Pancreas is well visualized and demonstrates considerable peripancreatic fluid. A dominant cyst is noted adjacent to the head of the pancreas measuring 3.6 cm. The previous measurement included in adjacent smaller cystic lesion accounting for the larger measurement on the prior exam. Increasing pseudocysts are noted about the tail. Spleen: Within normal limits in size and appearance. Adrenals/Urinary Tract: No masses identified. No evidence of hydronephrosis. Stomach/Bowel: Diverticular change is noted of the colon without evidence of diverticulitis. The appendix is not visualized consistent with prior surgical history. Vascular/Lymphatic: No pathologically enlarged lymph nodes. Aortoiliac calcifications are noted with mild ectasia of the distal abdominal aorta measuring 2.7 cm. Reproductive: The uterus has been surgically removed. No pelvic mass lesion is noted. Other: None. Musculoskeletal:  Degenerative changes of lumbar spine are seen. IMPRESSION: Increasing bilateral pleural effusions. Increased right lower lobe consolidation is noted obscuring the known cavitary mass lesion. Increase in the size and number of peripancreatic pseudocysts. Electronically Signed   By: Inez Catalina M.D.   On: 09/29/2015 12:38    Scheduled Meds: . diatrizoate meglumine-sodium  30 mL Oral Once  . ipratropium  0.5 mg Nebulization BID  . levalbuterol  1.25 mg Nebulization  BID  . loratadine  10 mg Oral Daily  . magnesium sulfate 1 - 4 g bolus IVPB  2 g Intravenous Once  . metoprolol succinate  25 mg Oral QHS  . metoprolol succinate  50 mg Oral Daily  . mometasone-formoterol  2 puff Inhalation BID  . nicotine  21 mg Transdermal Daily  . pantoprazole  40 mg Oral Daily  . potassium chloride  20 mEq Oral Once  . potassium chloride  40 mEq Oral Q4H   Continuous Infusions: . sodium chloride 100 mL (09/29/15 1046)    Principal Problem:   Acute on chronic respiratory failure with hypoxia (HCC) Active Problems:   Hereditary hemochromatosis (HCC)   Essential hypertension   GERD   Asthma   CAD (coronary artery disease)   History of tobacco abuse   Hypokalemia   COPD (chronic obstructive pulmonary disease) with emphysema ([place])   Other mixed  anxiety disorders   Wegener's granulomatosis with renal involvement ([place])   Pancreatitis   Pancreatic pseudocyst   Bilateral pleural effusion   UTI (lower urinary tract infection)    Time spent: 30 minutes    Barton Dubois  [facility] Pager [phone]. If 7PM-7AM, please contact night-coverage at [email or web address], password [facility] 09/29/2015, 3:30 PM

## 2015-09-30 DIAGNOSIS — N390 Urinary tract infection, site not specified: Secondary | ICD-10-CM

## 2015-09-30 DIAGNOSIS — I2510 Atherosclerotic heart disease of native coronary artery without angina pectoris: Secondary | ICD-10-CM

## 2015-09-30 LAB — URINE CULTURE: Special Requests: NORMAL

## 2015-09-30 LAB — BASIC METABOLIC PANEL
ANION GAP: 7 (ref 5–15)
BUN: 6 mg/dL (ref 6–20)
CALCIUM: 8 mg/dL — AB (ref 8.9–10.3)
CO2: 25 mmol/L (ref 22–32)
Chloride: 102 mmol/L (ref 101–111)
Creatinine, Ser: 0.58 mg/dL (ref 0.44–1.00)
GFR calc Af Amer: >60 mL/min (ref >60)
GLUCOSE: 214 mg/dL — AB (ref 65–99)
Potassium: 3.6 mmol/L (ref 3.5–5.1)
SODIUM: 134 mmol/L — AB (ref 135–145)

## 2015-09-30 LAB — MAGNESIUM: Magnesium: 2 mg/dL (ref 1.7–2.4)

## 2015-09-30 LAB — GLUCOSE, CAPILLARY: Glucose-Capillary: 156 mg/dL — ABNORMAL HIGH (ref 65–99)

## 2015-09-30 LAB — AMYLASE, PLEURAL FLUID: AMYLASE, PLEURAL FLUID: 26 U/L

## 2015-09-30 MED ORDER — POTASSIUM CHLORIDE CRYS ER 20 MEQ PO TBCR
40.0000 meq | EXTENDED_RELEASE_TABLET | Freq: Once | ORAL | Status: AC
  Administered 2015-09-30: 40 meq via ORAL
  Filled 2015-09-30: qty 2

## 2015-09-30 MED ORDER — PANTOPRAZOLE SODIUM 40 MG PO TBEC
40.0000 mg | DELAYED_RELEASE_TABLET | Freq: Two times a day (BID) | ORAL | Status: DC
  Administered 2015-09-30 – 2015-10-07 (×14): 40 mg via ORAL
  Filled 2015-09-30 (×14): qty 1

## 2015-09-30 MED ORDER — OXYCODONE HCL 5 MG PO TABS
10.0000 mg | ORAL_TABLET | Freq: Once | ORAL | Status: AC
  Administered 2015-09-30: 10 mg via ORAL
  Filled 2015-09-30: qty 2

## 2015-09-30 MED ORDER — OXYCODONE HCL 5 MG PO TABS
5.0000 mg | ORAL_TABLET | ORAL | Status: DC | PRN
  Administered 2015-09-30 – 2015-10-05 (×12): 5 mg via ORAL
  Filled 2015-09-30 (×13): qty 1

## 2015-09-30 NOTE — Progress Notes (Signed)
Attempt to wean O2. 29min off O2, sat dropped to 88-89%. Pt encouraged to take deep breaths and sat came up to 91-92%. O2 restarted at 1l/min. Will recheck sat in 30 min. Eulas Post, RN

## 2015-09-30 NOTE — Evaluation (Addendum)
Occupational Therapy Evaluation Patient Details Name: [name] [name] [name] [name] [name] MRN: [id number] DOB: 13-Jan-1946 Today's Date: 09/30/2015    History of Present Illness 70 y.o. female with medical history significant of COPD, asthma, tobacco abuse, hyperlipidemia, GERD, depression, anxiety, chronic diarrhea, CAD, granulomatosis with polyangiitis, recent admission due to acute pancreatitis with pseudocyst and admitted 09/28/15 for acute on chronic respiratory failure with hypoxia   Clinical Impression   Pt was admitted for the above.  At baseline, she is independent with adls.  Pt was hampered by multiple lines today, and she needed min guard to min A overall. Goals in acute are for supervision level    Follow Up Recommendations  Home health OT    Equipment Recommendations   (likely none)    Recommendations for Other Services       Precautions / Restrictions Precautions Precautions: Fall Precaution Comments: monitor sats Restrictions Weight Bearing Restrictions: No      Mobility Bed Mobility Overal bed mobility: Modified Independent                Transfers     Transfers: Sit to/from Stand;Stand Pivot Transfers Sit to Stand: Min guard Stand pivot transfers: Min guard       General transfer comment: for safety:  shaky    Balance                                            ADL Overall ADL's : Needs assistance/impaired     Grooming: Set up;Sitting;Wash/dry hands;Wash/dry face   Upper Body Bathing: Set up;Sitting   Lower Body Bathing: Minimal assistance;Sit to/from stand   Upper Body Dressing : Minimal assistance;Sitting (lines)   Lower Body Dressing: Minimal assistance;Sit to/from stand   Toilet Transfer: Minimal assistance;Stand-pivot;BSC;RW   Toileting- Clothing Manipulation and Hygiene: Moderate assistance;Sit to/from stand (lines)         General ADL Comments: performed transfer to 3:1 and performed ADL from EOB.  Pt had limitations due  to multiple lines.  Had thoracentesis yesterday and sore.  She is independent and active at baseline.  Pt shaky needed guarding but no LOB during session; held to armrest and bedrail for support  started energy conservation education     Vision     Perception     Praxis      Pertinent Vitals/Pain Pain Assessment: Faces Faces Pain Scale: Hurts a little bit Pain Location: L hip and back Pain Descriptors / Indicators:  (chronic) Pain Intervention(s): Limited activity within patient's tolerance;Monitored during session;Repositioned     Hand Dominance     Extremity/Trunk Assessment Upper Extremity Assessment Upper Extremity Assessment: Overall WFL for tasks assessed           Communication Communication Communication: No difficulties   Cognition Arousal/Alertness: Awake/alert Behavior During Therapy: WFL for tasks assessed/performed Overall Cognitive Status: Within Functional Limits for tasks assessed                     General Comments       Exercises       Shoulder Instructions      Home Living Family/patient expects to be discharged to:: Private residence Living Arrangements: Alone Available Help at Discharge: Family;Available PRN/intermittently               Bathroom Shower/Tub: Walk-in shower   Bathroom Toilet: Handicapped height (grab bar)  Additional Comments: has a chair she can put in shower      Prior Functioning/Environment Level of Independence: Independent             OT Diagnosis: Generalized weakness   OT Problem List: Decreased activity tolerance;Impaired balance (sitting and/or standing);Decreased knowledge of use of DME or AE;Pain;Cardiopulmonary status limiting activity   OT Treatment/Interventions: Self-care/ADL training;Energy conservation;DME and/or AE instruction;Patient/family education;Balance training    OT Goals(Current goals can be found in the care plan section) Acute Rehab OT Goals Patient Stated  Goal: return to being independent OT Goal Formulation: With patient Time For Goal Achievement: 10/07/15 Potential to Achieve Goals: Good ADL Goals Pt Will Perform Grooming: with supervision;standing Pt Will Transfer to Toilet: with supervision;ambulating;grab bars (high commode) Pt Will Perform Toileting - Clothing Manipulation and hygiene: with supervision;sit to/from stand Pt Will Perform Tub/Shower Transfer: Shower transfer;with supervision;shower seat;ambulating Additional ADL Goal #1: pt will complete adl with set up/supervision, sit to stand, an initiate at least one rest break for energy conservation  OT Frequency: Min 2X/week   Barriers to D/C:            Co-evaluation              End of Session Nurse Communication:  (no chair alarm pad found; pt wanted clean sheets)  Activity Tolerance: Patient tolerated treatment well Patient left: in chair;with call bell/phone within reach;with family/visitor present   Time: [id number] OT Time Calculation (min): 24 min Charges:  OT General Charges $OT Visit: 1 Procedure OT Evaluation $OT Eval Moderate Complexity: 1 Procedure   G-Codes:    [name] [name] [name] [name] [name] [name] [name] 09, 2017, 9:54 AM  [name] [name] [name] [name] [name], [name] [name] [phone] 10-05-15

## 2015-09-30 NOTE — Progress Notes (Signed)
[facility] PROGRESS NOTE  [name] [name] [id number] DOB: 31-Oct-1945 DOA: 09/28/2015 PCP: [name], MD  Interim summary and HPI 70 y.o. female with medical history significant of COPD, asthma, tobacco abuse, hyperlipidemia, GERD, depression, anxiety, chronic diarrhea, CAD, granulomatosis with polyangiitis, recent admission due to acute pancreatitis with pseudocyst, just discharged yesterday, who presents with continue abdominal pain, SOB and dysuria.  Patient was recently hospitalized from 6/27 for 09/27/15 due to pancreatitis with pseudocyst formation. Per discharge summary, the pseudocyst was discussed with GI who saw no reason to pursue drainage at that time. Patient was discharged home in stable condition. She states that she did not get her pain medication refilled, and abdominal pain has worsened. It is located in the epigastric area, sharp, 10 out of 10 in severity, nonradiating. It is associated with nausea, but no vomiting. She is generalized weakness and poor appetite. She states that she has chronic diarrhea, which has not changed. She also reports shortness of breath, but she feels that her SOB is at her baseline. She has mild dry cough, but no chest pain, fever or chills. Patient completed course of Diflucan for yeast infection, but still has dysuria and burning on urination. She has palpitation, no rashes, hematemesis, hematochezia, hematuria or unilateral weakness.  Assessment/Plan: 1. Pancreatitis with pseudocyst and pleural effusion:  -The pseudocyst appears slightly smaller; even there is other smaller ones affecting the tail of the pancreas. was discussed with GI who see no reason to pursue drainage at this point.  -Lipase to be repeated in am after diet completely advance  -continue supportive care and slowly continue advancing diet -continue IVF's (rate adjusted) and PRN analgesics -last evaluation of lipid panel with TG of 229 -etiology for pancreatitis most  likely due to prolong use of prednisone and bactrim -continue flutter valve/IS -will follow clinical response; home in the next 24-48 hours hopefully. -s/p thoracentesis; will follow final fluid results. Patient is afebrile and with normal WBC's. Reported improvement in her breathing.  2. Granulomatosis with polyangiitis ([place]):  Followed by Dr. [name] at [place]. Last Rituxan infusion was plan for 09/23/15. -Family will call to reschedule Rituxan for after discharge. -has Finish Medrol with 4 mg today; will follow VS and needs for further steroids -has also finished Bactrim (was on for prophylaxis while on steroids)  -continue outpatient follow up with rheumatology service at discharge  3. Hypokalemia:  -will replete as needed (most likely from diarrhea) -Mg WNL  4. HTN and hx non-obs CAD:  -Continue metoprolol and aspirin -no CP reported   5. Hx of emphysema:  -Continue Breo daily -Smoking cessation encouraged/advised  -no wheezing  6. Depression/anxiety:  -Continue diazepam PRN -no SI or hallucinations   7. Smoking:  -Cessation counseling provided -patient looking to quit  8. Hyperglycemia: -From prednisone and pancreatitis -will continue Daily CBG and SSI only if needed (CBG's > 200) -A1C 6.5 from last admission   9. Leukocytosis: -resolved and WNL now -will monitor   10. Tachycardia: -Likely from pain -will monitor for 24 hours on telemetry -continue B-blocker   11. Dysuria: -urine cx demonstrated multiple species -received fosfomycin on admission  -UA no suggesting frank UTI -probably cystitis and/or vaginal atrophy as cause for her symptoms; which are improving now.   Code Status: Full Family Communication: no family at bedside Disposition Plan: continue supportive care, gentle IVF's and pain meds. S/P right thoracentesis (given SOB on exertion and hypoxia; moderate effusion appreciated).     Consultants:  GI (Dr.  Henrene Pastor curbside by myself  after repeating CT abd/pelvis. No further recommendations or work up needed at this moment) Continue supportive care for pancreatitis  Procedures:  See below for x-ray reports  Right thoracentesis 09/29/15 (1.4L removed; final fluid analysis results pending)  Antibiotics:  None   HPI/Subjective: Feeling better from pain stand point and also breathing easier. Patient reports appetite is better and looking to increase PO intake.  Objective: Filed Vitals:   09/30/15 0440 09/30/15 0946  BP: 118/79 119/72  Pulse: 92 89  Temp: 98.2 F (36.8 C)   Resp: 22     Intake/Output Summary (Last 24 hours) at 09/30/15 1615 Last data filed at 09/30/15 1423  Gross per 24 hour  Intake 1571.67 ml  Output   1075 ml  Net 496.67 ml   Filed Weights   09/28/15 2353  Weight: 69.5 kg (153 lb 3.5 oz)    Exam:   General:  Afebrile. No CP. Reports breathing and pleuritic back pain improved. No further vomiting, but continue experiencing intermittent nausea. Still with abd pain, but improved. Still requiring low dose oxygen supplementation on exertion.  Cardiovascular: S1 and S2, mild sinus tachycardia, no rubs, no gallops  Respiratory: no wheezing or crackles on exam; decrease BS at bases (R > L)  Abdomen: soft, mild to moderate tenderness to palpation in mid epigastric area and RUQ/LUQ; positive BS. Last BM on 6/30 (diarrhea)  Musculoskeletal: no edema, no cyanosis   Data Reviewed: Basic Metabolic Panel:  Recent Labs Lab 09/25/15 0517 09/26/15 0718 09/27/15 0532 09/28/15 1934 09/29/15 0431 09/30/15 0903  NA 135 134* 138 136 138 134*  K 3.8 2.6* 4.0 3.3* 3.1* 3.6  CL 106 103 106 103 104 102  CO2 21* 24 25 25 28 25   GLUCOSE 70 173* 118* 108* 123* 214*  BUN 7 5* 5* 9 8 6   CREATININE 0.49 0.42* 0.66 0.65 0.69 0.58  CALCIUM 8.3* 8.0* 8.3* 8.3* 8.0* 8.0*  MG 1.7  --   --   --  1.8 2.0   Liver Function Tests:  Recent Labs Lab 09/24/15 0348 09/25/15 0517 09/27/15 0532  09/28/15 1934  AST 20 26 23 28   ALT 46 51 44 54  ALKPHOS 73 81 75 88  BILITOT 0.8 1.0 0.7 0.4  PROT 4.7* 5.5* 5.0* 5.2*  ALBUMIN 2.2* 2.4* 2.2* 2.4*    Recent Labs Lab 09/25/15 0517 09/27/15 0532 09/28/15 1943  LIPASE 94* 97* 93*   CBC:  Recent Labs Lab 09/24/15 0348 09/25/15 0517 09/26/15 0718 09/28/15 1934 09/29/15 0431  WBC 9.3 12.2* 8.5 11.2* 9.0  NEUTROABS  --   --   --  9.1*  --   HGB 11.1* 12.5 11.4* 12.3 11.1*  HCT 31.7* 35.8* 32.1* 34.6* 32.3*  MCV 92.7 95.0 93.0 93.5 95.3  PLT 127* 162 137* 194 174   CBG:  Recent Labs Lab 09/25/15 0508 09/26/15 0519 09/27/15 0516 09/29/15 0809 09/30/15 0812  GLUCAP 68 234* 136* 128* 156*   Studies: Dg Chest 1 View  09/29/2015  CLINICAL DATA:  Right-sided thoracentesis.Reportedly 1.4 L of fluid was removed. Patient is still having shortness of breath. EXAM: CHEST 1 VIEW COMPARISON:  09/28/2015. FINDINGS: The RIGHT pleural effusion is diminished. There is improved opacity at the RIGHT base, likely resolving atelectasis. LEFT pleural effusion appears stable compared with priors. The heart size is normal. No visible RIGHT pneumothorax. IMPRESSION: Decreased RIGHT pleural effusion post thoracentesis. No pneumothorax is evident. Mild atelectasis RIGHT base. Electronically Signed   By:  Staci Righter M.D.   On: 09/29/2015 17:09   Dg Chest 2 View  09/28/2015  CLINICAL DATA:  Low oxygen saturation. EXAM: CHEST  2 VIEW COMPARISON:  Chest radiographs 09/23/2015. Lung bases from CT abdomen 09/23/2015. FINDINGS: Increased size of right pleural effusion, now at least moderate in degree. Increased size of left pleural effusion, small. Increasing right perihilar opacity may be compressive atelectasis secondary to pleural fluid. Cardiomediastinal contours are unchanged. No pneumothorax. No definite pulmonary edema. IMPRESSION: Increasing bilateral pleural effusions from exam 5 days prior, now moderate in degree on the right, and small on the  left. Associated right perihilar opacity may be secondary to compressive atelectasis. Electronically Signed   By: Jeb Levering M.D.   On: 09/28/2015 21:02   Ct Abdomen Pelvis W Contrast  09/29/2015  CLINICAL DATA:  Abdominal pain with nausea and vomiting EXAM: CT ABDOMEN AND PELVIS WITH CONTRAST TECHNIQUE: Multidetector CT imaging of the abdomen and pelvis was performed using the standard protocol following bolus administration of intravenous contrast. CONTRAST:  186mL ISOVUE-300 IOPAMIDOL (ISOVUE-300) INJECTION 61% COMPARISON:  09/23/2015 FINDINGS: Lower chest: Bilateral pleural effusions are noted right considerably greater than left increased from the prior exam. Right lower lobe consolidation is noted as well. The previously seen right cavitary lesion is obscured by the consolidation. Mild left basilar atelectasis is noted as well. Hepatobiliary: Scattered hypodensities are noted throughout the liver consistent with hepatic cysts. The gallbladder has been surgically removed. Pancreas: Pancreas is well visualized and demonstrates considerable peripancreatic fluid. A dominant cyst is noted adjacent to the head of the pancreas measuring 3.6 cm. The previous measurement included in adjacent smaller cystic lesion accounting for the larger measurement on the prior exam. Increasing pseudocysts are noted about the tail. Spleen: Within normal limits in size and appearance. Adrenals/Urinary Tract: No masses identified. No evidence of hydronephrosis. Stomach/Bowel: Diverticular change is noted of the colon without evidence of diverticulitis. The appendix is not visualized consistent with prior surgical history. Vascular/Lymphatic: No pathologically enlarged lymph nodes. Aortoiliac calcifications are noted with mild ectasia of the distal abdominal aorta measuring 2.7 cm. Reproductive: The uterus has been surgically removed. No pelvic mass lesion is noted. Other: None. Musculoskeletal:  Degenerative changes of lumbar  spine are seen. IMPRESSION: Increasing bilateral pleural effusions. Increased right lower lobe consolidation is noted obscuring the known cavitary mass lesion. Increase in the size and number of peripancreatic pseudocysts. Electronically Signed   By: Inez Catalina M.D.   On: 09/29/2015 12:38   US Thoracentesis Asp Pleural Space W/img Guide  09/29/2015  INDICATION: Patient with history of pancreatitis with pseudocyst formation, Wegener's granulomatosis, COPD, right pleural effusion. Request made for diagnostic and therapeutic right thoracentesis. EXAM: ULTRASOUND GUIDED DIAGNOSTIC AND THERAPEUTIC RIGHT THORACENTESIS MEDICATIONS: None. COMPLICATIONS: None immediate. PROCEDURE: An ultrasound guided thoracentesis was thoroughly discussed with the patient and questions answered. The benefits, risks, alternatives and complications were also discussed. The patient understands and wishes to proceed with the procedure. Written consent was obtained. Ultrasound was performed to localize and mark an adequate pocket of fluid in the right chest. The area was then prepped and draped in the normal sterile fashion. 1% Lidocaine was used for local anesthesia. Under ultrasound guidance a Safe-T-Centesis catheter was introduced. Thoracentesis was performed. The catheter was removed and a dressing applied. FINDINGS: A total of approximately 1.4 liters of yellow fluid was removed. Samples were sent to the laboratory as requested by the clinical team. IMPRESSION: Successful ultrasound guided diagnostic and therapeutic right thoracentesis yielding 1.4  liters of pleural fluid. Read by: Rowe Robert, PA-C Electronically Signed   By: Corrie Mckusick D.O.   On: 09/29/2015 16:43    Scheduled Meds: . diatrizoate meglumine-sodium  30 mL Oral Once  . ipratropium  0.5 mg Nebulization BID  . levalbuterol  1.25 mg Nebulization BID  . loratadine  10 mg Oral Daily  . metoprolol succinate  25 mg Oral QHS  . metoprolol succinate  50 mg Oral Daily   . mometasone-formoterol  2 puff Inhalation BID  . nicotine  21 mg Transdermal Daily  . pantoprazole  40 mg Oral BID  . potassium chloride  20 mEq Oral Once   Continuous Infusions: . sodium chloride 50 mL/hr at 09/30/15 0409    Principal Problem:   Acute on chronic respiratory failure with hypoxia (HCC) Active Problems:   Hereditary hemochromatosis ([place])   Essential hypertension   GERD   Asthma   CAD (coronary artery disease)   History of tobacco abuse   Hypokalemia   COPD (chronic obstructive pulmonary disease) with emphysema ([place])   Other mixed anxiety disorders   Wegener's granulomatosis with renal involvement ([place])   Pancreatitis   Pancreatic pseudocyst   Bilateral pleural effusion   UTI (lower urinary tract infection)    Time spent: 30 minutes    Barton Dubois  [facility] Pager [phone]. If 7PM-7AM, please contact night-coverage at [email or web address], password [facility] 09/30/2015, 4:15 PM  LOS: 1 day

## 2015-09-30 NOTE — Progress Notes (Signed)
Physical Therapy Treatment Patient Details Name: [name] [name] MRN: [id number] DOB: 1945/04/29 Today's Date: 09/30/2015    History of Present Illness 70 y.o. female with medical history significant of COPD, asthma, tobacco abuse, hyperlipidemia, GERD, depression, anxiety, chronic diarrhea, CAD, granulomatosis with polyangiitis, recent admission due to acute pancreatitis with pseudocyst and admitted 09/28/15 for acute on chronic respiratory failure with hypoxia    PT Comments    Assisted pt OOb to bathroom then amb in hallway on RA avg sats 88% with HR 101.  Pt feels "better" with decreased c/o dyspnea.    Follow Up Recommendations  Home health PT     Equipment Recommendations  Rolling walker with 5" wheels    Recommendations for Other Services       Precautions / Restrictions Precautions Precautions: Fall Precaution Comments: monitor sats Restrictions Weight Bearing Restrictions: No    Mobility  Bed Mobility Overal bed mobility: Modified Independent             General bed mobility comments: increased time due to lines  Transfers Overall transfer level: Needs assistance Equipment used: None Transfers: Sit to/from Stand Sit to Stand: Supervision;Min guard         General transfer comment: for safety:  shaky.  assisted with toilet transfer as well  Ambulation/Gait Ambulation/Gait assistance: Supervision;Min guard Ambulation Distance (Feet): 72 Feet Assistive device: Rolling walker (2 wheeled) Gait Pattern/deviations: Step-through pattern;Decreased stride length Gait velocity: WFL   General Gait Details: mild unsteady shaky gait used RW for safety.  Decreased c/o dyspnea.  Amb on RA avg sats 88   Stairs            Wheelchair Mobility    Modified Rankin (Stroke Patients Only)       Balance                                    Cognition Arousal/Alertness: Awake/alert Behavior During Therapy: WFL for tasks  assessed/performed Overall Cognitive Status: Within Functional Limits for tasks assessed                      Exercises      General Comments        Pertinent Vitals/Pain Pain Assessment: Faces Faces Pain Scale: Hurts little more Pain Descriptors / Indicators: Aching;Tender Pain Intervention(s): Monitored during session    Home Living                      Prior Function            PT Goals (current goals can now be found in the care plan section) Progress towards PT goals: Progressing toward goals    Frequency  Min 3X/week    PT Plan      Co-evaluation             End of Session Equipment Utilized During Treatment: Gait belt Activity Tolerance: Patient tolerated treatment well Patient left: in bed;with call bell/phone within reach     Time: 1357-1421 PT Time Calculation (min) (ACUTE ONLY): 24 min  Charges:  $Gait Training: 8-22 mins $Therapeutic Activity: 8-22 mins                    G Codes:      Rica Koyanagi  PTA [facility]  Acute  Rehab Pager      [phone]

## 2015-10-01 ENCOUNTER — Inpatient Hospital Stay (HOSPITAL_COMMUNITY): Payer: Medicare Other

## 2015-10-01 DIAGNOSIS — J90 Pleural effusion, not elsewhere classified: Principal | ICD-10-CM

## 2015-10-01 DIAGNOSIS — K863 Pseudocyst of pancreas: Secondary | ICD-10-CM

## 2015-10-01 LAB — COMPREHENSIVE METABOLIC PANEL
ALBUMIN: 2 g/dL — AB (ref 3.5–5.0)
ALT: 35 U/L (ref 14–54)
ANION GAP: 6 (ref 5–15)
AST: 27 U/L (ref 15–41)
Alkaline Phosphatase: 76 U/L (ref 38–126)
BUN: 5 mg/dL — ABNORMAL LOW (ref 6–20)
CHLORIDE: 101 mmol/L (ref 101–111)
CO2: 29 mmol/L (ref 22–32)
Calcium: 8 mg/dL — ABNORMAL LOW (ref 8.9–10.3)
Creatinine, Ser: 0.56 mg/dL (ref 0.44–1.00)
GFR calc Af Amer: >60 mL/min (ref >60)
GFR calc non Af Amer: >60 mL/min (ref >60)
GLUCOSE: 130 mg/dL — AB (ref 65–99)
POTASSIUM: 4.3 mmol/L (ref 3.5–5.1)
SODIUM: 136 mmol/L (ref 135–145)
Total Bilirubin: 0.8 mg/dL (ref 0.3–1.2)
Total Protein: 4.5 g/dL — ABNORMAL LOW (ref 6.5–8.1)

## 2015-10-01 LAB — GLUCOSE, CAPILLARY: Glucose-Capillary: 134 mg/dL — ABNORMAL HIGH (ref 65–99)

## 2015-10-01 LAB — LIPASE, BLOOD: Lipase: 66 U/L — ABNORMAL HIGH (ref 11–51)

## 2015-10-01 MED ORDER — LIDOCAINE 5 % EX PTCH
1.0000 | MEDICATED_PATCH | CUTANEOUS | Status: DC
  Administered 2015-10-01: 1 via TRANSDERMAL
  Filled 2015-10-01 (×7): qty 1

## 2015-10-01 NOTE — Progress Notes (Signed)
PROGRESS NOTE    [name] [name]  [id number] DOB: 09-18-1945 DOA: 09/28/2015 PCP: [name], MD   Brief Narrative:   70 y.o. female with medical history significant of COPD, asthma, tobacco abuse, hyperlipidemia, GERD, depression, anxiety, chronic diarrhea, CAD, granulomatosis with polyangiitis, recent admission due to acute pancreatitis with pseudocyst, just discharged yesterday, who presents with continue abdominal pain, SOB and dysuria.  Assessment & Plan:   Principal Problem:   Acute on chronic respiratory failure with hypoxia (HCC) Active Problems:   Hereditary hemochromatosis (HCC)   Essential hypertension   GERD   Asthma   CAD (coronary artery disease)   History of tobacco abuse   Hypokalemia   COPD (chronic obstructive pulmonary disease) with emphysema (HCC)   Other mixed anxiety disorders   Wegener's granulomatosis with renal involvement (HCC)   Pancreatitis   Pancreatic pseudocyst   Bilateral pleural effusion   UTI (lower urinary tract infection)   1. Cardiovascular. HTN. Will continue blood pressure control with metoprolol.  2. Pulmonary. Right pleural effusion.  Clinically improved, will continue to monitor oxymetry and supplemental 02 per [place] to target 02 sat above 92% Noted re- acummulation of fluid, will follow chest film in am, patient may need a pleural catheter.   3. Nephrology. Renal function with cr at 0.56 will follow on renal panel in am, avoid hypotension and nephrotoxic medications.  4. GI. Pancreatic pseudocyst.  Will continue supportive care, added lidocaine patch for pain control. Diet as tolerated.    DVT prophylaxis:  Code Status:  Family Communication Disposition Plan:   Consultants:     Procedures:  Thoracentesis on the right 09/29/15 1.4 liters of yellow fluid.  Antimicrobials:   Subjective: Positive back pain, worse with deep inspiration and movement, improved with pain medications, no associated nausea or vomiting, no  abdominal pain. No chest pain or dyspnea. Back pain 8-10 in intensity and sharp in nature.  Objective: Filed Vitals:   09/30/15 2029 09/30/15 2146 10/01/15 0518 10/01/15 0759  BP:  124/73 116/58   Pulse:  106 93   Temp:  99.4 F (37.4 C) 98.3 F (36.8 C)   TempSrc:  Oral Oral   Resp:  20 20   Height:      Weight:      SpO2: 94% 95% 97% 95%    Intake/Output Summary (Last 24 hours) at 10/01/15 0941 Last data filed at 10/01/15 0600  Gross per 24 hour  Intake   1200 ml  Output   2000 ml  Net   -800 ml   Filed Weights   09/28/15 2353  Weight: 69.5 kg (153 lb 3.5 oz)    Examination:  General exam: deconditioned and in pain E ENT: mild conjunctival pallor, with dry oral mucosa. Respiratory system: decreased breath sounds at bases with no wheezing, rales or rhonchi. Cardiovascular system: S1 & S2 heard, RRR. No JVD, murmurs, rubs, gallops or clicks. No pedal edema. Gastrointestinal system: Abdomen is mild distended, soft and nontender. No organomegaly or masses felt. Normal bowel sounds heard. Central nervous system: Alert and oriented. No focal neurological deficits. Extremities: Symmetric 5 x 5 power. Skin: No rashes, lesions or ulcers   Data Reviewed: I have personally reviewed following labs and imaging studies  CBC:  Recent Labs Lab 09/25/15 0517 09/26/15 0718 09/28/15 1934 09/29/15 0431  WBC 12.2* 8.5 11.2* 9.0  NEUTROABS  --   --  9.1*  --   HGB 12.5 11.4* 12.3 11.1*  HCT 35.8* 32.1* 34.6* 32.3*  MCV 95.0 93.0 93.5 95.3  PLT 162 137* 194 [id number]   Basic Metabolic Panel:  Recent Labs Lab 09/25/15 0517  09/27/15 0532 09/28/15 1934 09/29/15 0431 09/30/15 0903 10/01/15 0424  NA 135  < > 138 136 138 134* 136  K 3.8  < > 4.0 3.3* 3.1* 3.6 4.3  CL 106  < > 106 103 104 102 101  CO2 21*  < > 25 25 28 25 29   GLUCOSE 70  < > 118* 108* 123* 214* 130*  BUN 7  < > 5* 9 8 6  5*  CREATININE 0.49  < > 0.66 0.65 0.69 0.58 0.56  CALCIUM 8.3*  < > 8.3* 8.3* 8.0* 8.0*  8.0*  MG 1.7  --   --   --  1.8 2.0  --   < > = values in this interval not displayed. GFR: Estimated Creatinine Clearance: 62 mL/min (by C-G formula based on Cr of 0.56). Liver Function Tests:  Recent Labs Lab 09/25/15 0517 09/27/15 0532 09/28/15 1934 10/01/15 0424  AST 26 23 28 27   ALT 51 44 54 35  ALKPHOS 81 75 88 76  BILITOT 1.0 0.7 0.4 0.8  PROT 5.5* 5.0* 5.2* 4.5*  ALBUMIN 2.4* 2.2* 2.4* 2.0*    Recent Labs Lab 09/25/15 0517 09/27/15 0532 09/28/15 1943 10/01/15 0424  LIPASE 94* 97* 93* 66*   No results for input(s): AMMONIA in the last 168 hours. Coagulation Profile:  Recent Labs Lab 09/29/15 0015  INR 1.38   Cardiac Enzymes: No results for input(s): CKTOTAL, CKMB, CKMBINDEX, TROPONINI in the last 168 hours. BNP (last 3 results) No results for input(s): PROBNP in the last 8760 hours. HbA1C: No results for input(s): HGBA1C in the last 72 hours. CBG:  Recent Labs Lab 09/26/15 0519 09/27/15 0516 09/29/15 0809 09/30/15 0812 10/01/15 0748  GLUCAP 234* 136* 128* 156* 134*   Lipid Profile: No results for input(s): CHOL, HDL, LDLCALC, TRIG, CHOLHDL, LDLDIRECT in the last 72 hours. Thyroid Function Tests: No results for input(s): TSH, T4TOTAL, FREET4, T3FREE, THYROIDAB in the last 72 hours. Anemia Panel: No results for input(s): VITAMINB12, FOLATE, FERRITIN, TIBC, IRON, RETICCTPCT in the last 72 hours. Sepsis Labs:  Recent Labs Lab 09/28/15 2001  LATICACIDVEN 1.62    Recent Results (from the past 240 hour(s))  Urine culture     Status: Abnormal   Collection Time: 09/28/15  7:46 PM  Result Value Ref Range Status   Specimen Description URINE, RANDOM  Final   Special Requests NONE  Final   Culture MULTIPLE SPECIES PRESENT, SUGGEST RECOLLECTION (A)  Final   Report Status 09/30/2015 FINAL  Final  Culture, Urine     Status: Abnormal   Collection Time: 09/29/15 11:48 AM  Result Value Ref Range Status   Specimen Description URINE, CLEAN CATCH  Final    Special Requests Normal  Final   Culture MULTIPLE SPECIES PRESENT, SUGGEST RECOLLECTION (A)  Final   Report Status 09/30/2015 FINAL  Final  Culture, body fluid-bottle     Status: None (Preliminary result)   Collection Time: 09/29/15  4:48 PM  Result Value Ref Range Status   Specimen Description FLUID RIGHT PLEURAL  Final   Special Requests BOTTLES DRAWN AEROBIC AND ANAEROBIC 10CC  Final   Culture   Final    NO GROWTH < 24 HOURS Performed at [facility]    Report Status PENDING  Incomplete  Gram stain     Status: None   Collection Time: 09/29/15  4:48  PM  Result Value Ref Range Status   Specimen Description FLUID RIGHT PLEURAL  Final   Special Requests NONE  Final   Gram Stain   Final    MODERATE WBC PRESENT,BOTH PMN AND MONONUCLEAR NO ORGANISMS SEEN Performed at [facility]    Report Status 09/29/2015 FINAL  Final         Radiology Studies: Dg Chest 1 View  09/29/2015  CLINICAL DATA:  Right-sided thoracentesis.Reportedly 1.4 L of fluid was removed. Patient is still having shortness of breath. EXAM: CHEST 1 VIEW COMPARISON:  09/28/2015. FINDINGS: The RIGHT pleural effusion is diminished. There is improved opacity at the RIGHT base, likely resolving atelectasis. LEFT pleural effusion appears stable compared with priors. The heart size is normal. No visible RIGHT pneumothorax. IMPRESSION: Decreased RIGHT pleural effusion post thoracentesis. No pneumothorax is evident. Mild atelectasis RIGHT base. Electronically Signed   By: Staci Righter M.D.   On: 09/29/2015 17:09   Dg Chest 2 View  10/01/2015  CLINICAL DATA:  Right-sided chest pain, hypoxia, former smoking history EXAM: CHEST  2 VIEW COMPARISON:  Chest x-ray of 09/29/2015 and CT chest of 06/26/2015 FINDINGS: There has been reaccumulation of a moderate size right pleural effusion with a small left pleural effusion present. Bibasilar atelectasis remains right greater than left. Heart size is stable. No bony  abnormality is seen. IMPRESSION: Reaccumulation of moderate size right pleural effusion with small left pleural effusion remaining. Electronically Signed   By: Ivar Drape M.D.   On: 10/01/2015 08:44   Ct Abdomen Pelvis W Contrast  09/29/2015  CLINICAL DATA:  Abdominal pain with nausea and vomiting EXAM: CT ABDOMEN AND PELVIS WITH CONTRAST TECHNIQUE: Multidetector CT imaging of the abdomen and pelvis was performed using the standard protocol following bolus administration of intravenous contrast. CONTRAST:  1104mL ISOVUE-300 IOPAMIDOL (ISOVUE-300) INJECTION 61% COMPARISON:  09/23/2015 FINDINGS: Lower chest: Bilateral pleural effusions are noted right considerably greater than left increased from the prior exam. Right lower lobe consolidation is noted as well. The previously seen right cavitary lesion is obscured by the consolidation. Mild left basilar atelectasis is noted as well. Hepatobiliary: Scattered hypodensities are noted throughout the liver consistent with hepatic cysts. The gallbladder has been surgically removed. Pancreas: Pancreas is well visualized and demonstrates considerable peripancreatic fluid. A dominant cyst is noted adjacent to the head of the pancreas measuring 3.6 cm. The previous measurement included in adjacent smaller cystic lesion accounting for the larger measurement on the prior exam. Increasing pseudocysts are noted about the tail. Spleen: Within normal limits in size and appearance. Adrenals/Urinary Tract: No masses identified. No evidence of hydronephrosis. Stomach/Bowel: Diverticular change is noted of the colon without evidence of diverticulitis. The appendix is not visualized consistent with prior surgical history. Vascular/Lymphatic: No pathologically enlarged lymph nodes. Aortoiliac calcifications are noted with mild ectasia of the distal abdominal aorta measuring 2.7 cm. Reproductive: The uterus has been surgically removed. No pelvic mass lesion is noted. Other: None.  Musculoskeletal:  Degenerative changes of lumbar spine are seen. IMPRESSION: Increasing bilateral pleural effusions. Increased right lower lobe consolidation is noted obscuring the known cavitary mass lesion. Increase in the size and number of peripancreatic pseudocysts. Electronically Signed   By: Inez Catalina M.D.   On: 09/29/2015 12:38   US Thoracentesis Asp Pleural Space W/img Guide  09/29/2015  INDICATION: Patient with history of pancreatitis with pseudocyst formation, Wegener's granulomatosis, COPD, right pleural effusion. Request made for diagnostic and therapeutic right thoracentesis. EXAM: ULTRASOUND GUIDED DIAGNOSTIC AND THERAPEUTIC RIGHT  THORACENTESIS MEDICATIONS: None. COMPLICATIONS: None immediate. PROCEDURE: An ultrasound guided thoracentesis was thoroughly discussed with the patient and questions answered. The benefits, risks, alternatives and complications were also discussed. The patient understands and wishes to proceed with the procedure. Written consent was obtained. Ultrasound was performed to localize and mark an adequate pocket of fluid in the right chest. The area was then prepped and draped in the normal sterile fashion. 1% Lidocaine was used for local anesthesia. Under ultrasound guidance a Safe-T-Centesis catheter was introduced. Thoracentesis was performed. The catheter was removed and a dressing applied. FINDINGS: A total of approximately 1.4 liters of yellow fluid was removed. Samples were sent to the laboratory as requested by the clinical team. IMPRESSION: Successful ultrasound guided diagnostic and therapeutic right thoracentesis yielding 1.4 liters of pleural fluid. Read by: Rowe Robert, PA-C Electronically Signed   By: Corrie Mckusick D.O.   On: 09/29/2015 16:43        Scheduled Meds: . diatrizoate meglumine-sodium  30 mL Oral Once  . ipratropium  0.5 mg Nebulization BID  . levalbuterol  1.25 mg Nebulization BID  . loratadine  10 mg Oral Daily  . metoprolol succinate   25 mg Oral QHS  . metoprolol succinate  50 mg Oral Daily  . mometasone-formoterol  2 puff Inhalation BID  . nicotine  21 mg Transdermal Daily  . pantoprazole  40 mg Oral BID   Continuous Infusions: . sodium chloride 50 mL/hr at 09/30/15 2124     LOS: 2 days        Mauricio Gerome Apley, MD [facility] Pager [phone]  If 7PM-7AM, please contact night-coverage [email or web address] Password [email or web address] 10/01/2015, 9:41 AM

## 2015-10-01 NOTE — Progress Notes (Signed)
Patient walked to bathroom off O2. O2 sat 87-88% off oxygen total of 15 min. Replaced O2 at 1 l/min. Rechecked after 30 min on 1 l/min and sat was 94%. Eulas Post, RN

## 2015-10-02 ENCOUNTER — Inpatient Hospital Stay (HOSPITAL_COMMUNITY): Payer: Medicare Other

## 2015-10-02 ENCOUNTER — Encounter: Payer: Medicare Other | Admitting: Internal Medicine

## 2015-10-02 ENCOUNTER — Telehealth: Payer: Self-pay | Admitting: Pulmonary Disease

## 2015-10-02 LAB — CBC WITH DIFFERENTIAL/PLATELET
Basophils Absolute: 0 10*3/uL (ref 0.0–0.1)
Basophils Relative: 0 %
Eosinophils Absolute: 0.1 10*3/uL (ref 0.0–0.7)
Eosinophils Relative: 1 %
HCT: 31.7 % — ABNORMAL LOW (ref 36.0–46.0)
HEMOGLOBIN: 10.9 g/dL — AB (ref 12.0–15.0)
LYMPHS ABS: 1 10*3/uL (ref 0.7–4.0)
LYMPHS PCT: 12 %
MCH: 33 pg (ref 26.0–34.0)
MCHC: 34.4 g/dL (ref 30.0–36.0)
MCV: 96.1 fL (ref 78.0–100.0)
MONO ABS: 0.9 10*3/uL (ref 0.1–1.0)
MONOS PCT: 11 %
NEUTROS ABS: 6.2 10*3/uL (ref 1.7–7.7)
NEUTROS PCT: 76 %
Platelets: 185 10*3/uL (ref 150–400)
RBC: 3.3 MIL/uL — ABNORMAL LOW (ref 3.87–5.11)
RDW: 15.4 % (ref 11.5–15.5)
WBC: 8.2 10*3/uL (ref 4.0–10.5)

## 2015-10-02 LAB — PROTEIN, BODY FLUID: Total protein, fluid: <3 g/dL

## 2015-10-02 LAB — BASIC METABOLIC PANEL
ANION GAP: 5 (ref 5–15)
BUN: 7 mg/dL (ref 6–20)
CALCIUM: 8.3 mg/dL — AB (ref 8.9–10.3)
CHLORIDE: 102 mmol/L (ref 101–111)
CO2: 30 mmol/L (ref 22–32)
CREATININE: 0.59 mg/dL (ref 0.44–1.00)
GFR calc Af Amer: >60 mL/min (ref >60)
GFR calc non Af Amer: >60 mL/min (ref >60)
GLUCOSE: 114 mg/dL — AB (ref 65–99)
Potassium: 4.6 mmol/L (ref 3.5–5.1)
Sodium: 137 mmol/L (ref 135–145)

## 2015-10-02 LAB — GRAM STAIN

## 2015-10-02 LAB — LACTATE DEHYDROGENASE, PLEURAL OR PERITONEAL FLUID: LD, Fluid: 336 U/L — ABNORMAL HIGH (ref 3–23)

## 2015-10-02 LAB — GLUCOSE, SEROUS FLUID: GLUCOSE FL: 72 mg/dL

## 2015-10-02 LAB — LACTATE DEHYDROGENASE: LDH: 190 U/L (ref 98–192)

## 2015-10-02 LAB — GLUCOSE, CAPILLARY: Glucose-Capillary: 117 mg/dL — ABNORMAL HIGH (ref 65–99)

## 2015-10-02 NOTE — Telephone Encounter (Signed)
Spoke with pt's daughter, Ginger. Pt is currently admitted at [facility]. She has been there for 2 weeks. Ginger wants to speak to AD about pt's lung mass and fluid build up in her lungs.  AD - please contact pt's daughter if possible, thanks.

## 2015-10-02 NOTE — Procedures (Signed)
Ultrasound-guided diagnostic and therapeutic right thoracentesis performed yielding 0.93liters of yellow colored fluid. No immediate complications. Follow-up chest x-ray pending.       [name] [name] E 3:50 PM 10/02/2015

## 2015-10-02 NOTE — Progress Notes (Signed)
PROGRESS NOTE    [name] [name]  KMM:[id number] DOB: [birth year] DOA: 09/28/2015 PCP: Pincus Sanes, MD   Brief Narrative: 70 y.o. female with medical history significant of COPD, asthma, tobacco abuse, hyperlipidemia, GERD, depression, anxiety, chronic diarrhea, CAD, granulomatosis with polyangiitis, recent admission due to acute pancreatitis with pseudocyst. Improved abdominal pain but found recurrent exudative pleural effusion.   Assessment & Plan:   Principal Problem:   Acute on chronic respiratory failure with hypoxia (HCC) Active Problems:   Hereditary hemochromatosis (HCC)   Essential hypertension   GERD   Asthma   CAD (coronary artery disease)   History of tobacco abuse   Hypokalemia   COPD (chronic obstructive pulmonary disease) with emphysema (HCC)   Other mixed anxiety disorders   Wegener's granulomatosis with renal involvement (HCC)   Pancreatitis   Pancreatic pseudocyst   Bilateral pleural effusion   UTI (lower urinary tract infection)  1. Cardiovascular. HTN. Will continue blood pressure control with metoprolol. Blood pressure has been stable.   2. Pulmonary. Right pleural effusion. Exudate per Light's criteria, had a rapid recurrence, will order repeat US guided thoracentesis. Will send fluid for cytology. Imaging personally reviewed noted NEW cavitary lesion on the right base present since march 2017 not present on 07/2014. Will need to rule out malignancy or Wegner's related, will consult primary pulmonary that has been following patient as outpatient. Will continue oxymetry monitor and supplemental 0-2 per [place] to target 02 sat above 92%.  3. Nephrology. Renal function with cr at 0.59 will follow on renal panel in am, avoid hypotension and nephrotoxic medications.  4. GI. Pancreatic pseudocyst. Will continue supportive care, not tolerated lidocaine patch, but overall abdominal pain improved and patient tolerating po with no nausea or vomiting.     DVT  prophylaxis:  Will start lovenox after toracentesis  Code Status:  full Family Communication:  I spoke with patient's family at the bedside and all questions were addressed. Disposition Plan: home with home health  Consultants:     Procedures:   Thoracentesis on 07/05  Antimicrobials:   Subjective: Persistent pain at the back of  the right hemithorax, pain moderate in intensity, dull in nature, worse with movement and deep inspiration, no associated nausea or vomiting. Abdominal pain improved. No significant dyspnea.    Objective: Filed Vitals:   10/01/15 0957 10/01/15 2017 10/01/15 2025 10/02/15 0743  BP: 123/71 131/68    Pulse: 111 94    Temp:  99.1 F (37.3 C)    TempSrc:  Oral    Resp:  20    Height:      Weight:      SpO2:  94% 94% 93%    Intake/Output Summary (Last 24 hours) at 10/02/15 1211 Last data filed at 10/02/15 0850  Gross per 24 hour  Intake   1170 ml  Output    400 ml  Net    770 ml   Filed Weights   09/28/15 2353  Weight: 69.5 kg (153 lb 3.5 oz)    Examination:  General exam: uncomfortable due to pain. E ENT: mild conjunctival pallor, oral mucosa moist.  Respiratory system: decreased breath sounds at the right base, dull to percussion, no wheezing, rales or rhonchi.  Cardiovascular system: S1 & S2 heard, RRR. No JVD, murmurs, rubs, gallops or clicks. No pedal edema. Gastrointestinal system: Abdomen is nondistended, soft and nontender. No organomegaly or masses felt. Normal bowel sounds heard. Central nervous system: Alert and oriented. No focal neurological deficits. Extremities: Symmetric  5 x 5 power. Skin: No rashes, lesions or ulcers   Data Reviewed: I have personally reviewed following labs and imaging studies  CBC:  Recent Labs Lab 09/26/15 0718 09/28/15 1934 09/29/15 0431 10/02/15 0415  WBC 8.5 11.2* 9.0 8.2  NEUTROABS  --  9.1*  --  6.2  HGB 11.4* 12.3 11.1* 10.9*  HCT 32.1* 34.6* 32.3* 31.7*  MCV 93.0 93.5 95.3 96.1  PLT  137* 194 174 [id number]   Basic Metabolic Panel:  Recent Labs Lab 09/28/15 1934 09/29/15 0431 09/30/15 0903 10/01/15 0424 10/02/15 0415  NA 136 138 134* 136 137  K 3.3* 3.1* 3.6 4.3 4.6  CL 103 104 102 101 102  CO2 25 28 25 29 30   GLUCOSE 108* 123* 214* 130* 114*  BUN 9 8 6  5* 7  CREATININE 0.65 0.69 0.58 0.56 0.59  CALCIUM 8.3* 8.0* 8.0* 8.0* 8.3*  MG  --  1.8 2.0  --   --    GFR: Estimated Creatinine Clearance: 62 mL/min (by C-G formula based on Cr of 0.59). Liver Function Tests:  Recent Labs Lab 09/27/15 0532 09/28/15 1934 10/01/15 0424  AST 23 28 27   ALT 44 54 35  ALKPHOS 75 88 76  BILITOT 0.7 0.4 0.8  PROT 5.0* 5.2* 4.5*  ALBUMIN 2.2* 2.4* 2.0*    Recent Labs Lab 09/27/15 0532 09/28/15 1943 10/01/15 0424  LIPASE 97* 93* 66*   No results for input(s): AMMONIA in the last 168 hours. Coagulation Profile:  Recent Labs Lab 09/29/15 0015  INR 1.38   Cardiac Enzymes: No results for input(s): CKTOTAL, CKMB, CKMBINDEX, TROPONINI in the last 168 hours. BNP (last 3 results) No results for input(s): PROBNP in the last 8760 hours. HbA1C: No results for input(s): HGBA1C in the last 72 hours. CBG:  Recent Labs Lab 09/27/15 0516 09/29/15 0809 09/30/15 0812 10/01/15 0748 10/02/15 0749  GLUCAP 136* 128* 156* 134* 117*   Lipid Profile: No results for input(s): CHOL, HDL, LDLCALC, TRIG, CHOLHDL, LDLDIRECT in the last 72 hours. Thyroid Function Tests: No results for input(s): TSH, T4TOTAL, FREET4, T3FREE, THYROIDAB in the last 72 hours. Anemia Panel: No results for input(s): VITAMINB12, FOLATE, FERRITIN, TIBC, IRON, RETICCTPCT in the last 72 hours. Sepsis Labs:  Recent Labs Lab 09/28/15 2001  LATICACIDVEN 1.62    Recent Results (from the past 240 hour(s))  Urine culture     Status: Abnormal   Collection Time: 09/28/15  7:46 PM  Result Value Ref Range Status   Specimen Description URINE, RANDOM  Final   Special Requests NONE  Final   Culture MULTIPLE  SPECIES PRESENT, SUGGEST RECOLLECTION (A)  Final   Report Status 09/30/2015 FINAL  Final  Culture, Urine     Status: Abnormal   Collection Time: 09/29/15 11:48 AM  Result Value Ref Range Status   Specimen Description URINE, CLEAN CATCH  Final   Special Requests Normal  Final   Culture MULTIPLE SPECIES PRESENT, SUGGEST RECOLLECTION (A)  Final   Report Status 09/30/2015 FINAL  Final  Culture, body fluid-bottle     Status: None (Preliminary result)   Collection Time: 09/29/15  4:48 PM  Result Value Ref Range Status   Specimen Description FLUID RIGHT PLEURAL  Final   Special Requests BOTTLES DRAWN AEROBIC AND ANAEROBIC 10CC  Final   Culture   Final    NO GROWTH 2 DAYS Performed at [facility]    Report Status PENDING  Incomplete  Gram stain     Status: None  Collection Time: 09/29/15  4:48 PM  Result Value Ref Range Status   Specimen Description FLUID RIGHT PLEURAL  Final   Special Requests NONE  Final   Gram Stain   Final    MODERATE WBC PRESENT,BOTH PMN AND MONONUCLEAR NO ORGANISMS SEEN Performed at [facility]    Report Status 09/29/2015 FINAL  Final         Radiology Studies: Dg Chest 2 View  10/02/2015  CLINICAL DATA:  Dyspnea, history of asthma, COPD, coronary artery disease, current smoker. EXAM: CHEST  2 VIEW COMPARISON:  Chest x-ray of October 01, 2015 FINDINGS: There has been interval increase in the volume of pleural fluid on the right which now occupies approximately 1/4 of the pleural space volume. There is no mediastinal shift. The heart is normal in size. The pulmonary vascularity is not engorged. The small left pleural effusion is stable. IMPRESSION: Mild further accumulation of pleural fluid on the right. Stable small left pleural effusion. Electronically Signed   By: David  Martinique M.D.   On: 10/02/2015 08:37   Dg Chest 2 View  10/01/2015  CLINICAL DATA:  Right-sided chest pain, hypoxia, former smoking history EXAM: CHEST  2 VIEW COMPARISON:  Chest  x-ray of 09/29/2015 and CT chest of 06/26/2015 FINDINGS: There has been reaccumulation of a moderate size right pleural effusion with a small left pleural effusion present. Bibasilar atelectasis remains right greater than left. Heart size is stable. No bony abnormality is seen. IMPRESSION: Reaccumulation of moderate size right pleural effusion with small left pleural effusion remaining. Electronically Signed   By: Ivar Drape M.D.   On: 10/01/2015 08:44        Scheduled Meds: . diatrizoate meglumine-sodium  30 mL Oral Once  . ipratropium  0.5 mg Nebulization BID  . levalbuterol  1.25 mg Nebulization BID  . lidocaine  1 patch Transdermal Q24H  . loratadine  10 mg Oral Daily  . metoprolol succinate  25 mg Oral QHS  . metoprolol succinate  50 mg Oral Daily  . mometasone-formoterol  2 puff Inhalation BID  . nicotine  21 mg Transdermal Daily  . pantoprazole  40 mg Oral BID   Continuous Infusions: . sodium chloride 50 mL/hr at 10/01/15 2000     LOS: 3 days        Tawni Millers, MD [facility] Pager [phone]  If 7PM-7AM, please contact night-coverage [email or web address] Password [email or web address] 10/02/2015, 12:11 PM

## 2015-10-02 NOTE — Progress Notes (Signed)
Patient ID: [name] [name], female   DOB: May 03, 1945, 70 y.o.   MRN: [id number] Patient with a small PTX s/p thoracentesis today.  She is asymptomatic.  We will obtain a follow up CXR at 1830.  No further intervention needed at this time as this will likely resolve on its own.  We will follow the patient and follow up with her in the morning.  If she were to worsen overnight with worsening shortness of breath, saturation drops, or worsening pain, call the IR doc on call.  [name] E 4:34 PM 10/02/2015

## 2015-10-02 NOTE — Progress Notes (Signed)
Dr. Joaquin Courts call [name] [name] listed in chart with update please. Has MANY questions about hospital stay and condition. Thanks.

## 2015-10-02 NOTE — Telephone Encounter (Signed)
Ginger returned call, CB is [phone].

## 2015-10-02 NOTE — Telephone Encounter (Signed)
Attempted to contact daughter, Ginger, left message for patient's daughter to call back.

## 2015-10-02 NOTE — Progress Notes (Signed)
Physical Therapy Treatment Patient Details Name: [name] [name] MRN: [id number] DOB: 1945/06/04 Today's Date: 10/02/2015    History of Present Illness 70 y.o. female with medical history significant of COPD, asthma, tobacco abuse, hyperlipidemia, GERD, depression, anxiety, chronic diarrhea, CAD, granulomatosis with polyangiitis, recent admission due to acute pancreatitis with pseudocyst and admitted 09/28/15 for acute on chronic respiratory failure with hypoxia    PT Comments    Pt in bed on 1 lt O2 at rest 94%.  Assisted OOB to amb on RA pt required increased time and walker for balance.  RA ranged from 84% initially to 92%.  Mod c/o fatigue but pt stated "it feels good to stand up".  Assisted back to bed per pt request.    Follow Up Recommendations  Home health PT     Equipment Recommendations  Rolling walker with 5" wheels    Recommendations for Other Services       Precautions / Restrictions Precautions Precautions: Fall Precaution Comments: monitor sats Restrictions Weight Bearing Restrictions: No    Mobility  Bed Mobility Overal bed mobility: Modified Independent             General bed mobility comments: increased time due to lines  Transfers Overall transfer level: Needs assistance Equipment used: None Transfers: Sit to/from Stand Sit to Stand: Supervision;Min guard         General transfer comment: increased time and one VC safety with turns  Ambulation/Gait Ambulation/Gait assistance: Min assist Ambulation Distance (Feet): 82 Feet Assistive device: Rolling walker (2 wheeled) Gait Pattern/deviations: Step-through pattern;Decreased stride length     General Gait Details: mild unsteady shaky gait used RW for safety.  Decreased c/o dyspnea.  Amb on RA avg sats range from 84 to 92%.   Stairs            Wheelchair Mobility    Modified Rankin (Stroke Patients Only)       Balance                                     Cognition Arousal/Alertness: Awake/alert Behavior During Therapy: WFL for tasks assessed/performed Overall Cognitive Status: Within Functional Limits for tasks assessed                      Exercises      General Comments        Pertinent Vitals/Pain Pain Assessment: No/denies pain    Home Living                      Prior Function            PT Goals (current goals can now be found in the care plan section) Progress towards PT goals: Progressing toward goals    Frequency  Min 3X/week    PT Plan Current plan remains appropriate    Co-evaluation             End of Session Equipment Utilized During Treatment: Gait belt Activity Tolerance: Patient tolerated treatment well Patient left: in bed;with call bell/phone within reach     Time: 1448-1511 PT Time Calculation (min) (ACUTE ONLY): 23 min  Charges:  $Gait Training: 8-22 mins $Therapeutic Activity: 8-22 mins                    G Codes:      [name] [name]  PTA [facility]  Acute  Rehab Pager      [phone]

## 2015-10-03 ENCOUNTER — Inpatient Hospital Stay (HOSPITAL_COMMUNITY): Payer: Medicare Other

## 2015-10-03 DIAGNOSIS — J948 Other specified pleural conditions: Secondary | ICD-10-CM

## 2015-10-03 DIAGNOSIS — J90 Pleural effusion, not elsewhere classified: Secondary | ICD-10-CM

## 2015-10-03 DIAGNOSIS — M3130 Wegener's granulomatosis without renal involvement: Secondary | ICD-10-CM

## 2015-10-03 DIAGNOSIS — J984 Other disorders of lung: Secondary | ICD-10-CM

## 2015-10-03 LAB — GLUCOSE, CAPILLARY: Glucose-Capillary: 171 mg/dL — ABNORMAL HIGH (ref 65–99)

## 2015-10-03 LAB — SEDIMENTATION RATE: SED RATE: 45 mm/h — AB (ref 0–22)

## 2015-10-03 NOTE — Care Management Important Message (Signed)
Important Message  Patient Details  Name: Tawanya Pereyra MRN: [id number] Date of Birth: 1945-11-03   Medicare Important Message Given:  Yes    Camillo Flaming 10/03/2015, 10:35 AMImportant Message  Patient Details  Name: Kc Palms MRN: [id number] Date of Birth: May 26, 1945   Medicare Important Message Given:  Yes    Camillo Flaming 10/03/2015, 10:35 AM

## 2015-10-03 NOTE — Consult Note (Addendum)
PULMONARY / CRITICAL CARE MEDICINE   Name: [name] [name] MRN: [id number] DOB: 1946-03-05    ADMISSION DATE:  09/28/2015 CONSULTATION DATE:  10/03/2015   REFERRING MD:  [facility]  CHIEF COMPLAINT:  Recurrent right pleural effusio  HISTORY OF PRESENT ILLNESS:   History is gained from review of the chart, talking to the patient and talking to primary pulmonologist Dr. Gerrit Friends DeDios.   It appears in May 2016 she had a CT MG gram of the chest that did not show any pulmonary issues including infiltrates or pulmonary embolism. Then early in 2017 she had microscopic immature area that was evaluated by urology with a CT abdomen mid March 2017 that showed normal pancreas but had a 3.5 cm right lower lobe cavitating mass. This was followed up by a CT chest 06/26/2015 that confirm the 3.5 cm right lower lobe thick-walled cavitating mass along with a left adrenal adenoma. The mediastinum itself was normal. She was then referred to Dr. Gerrit Friends DeDios for evaluation. She underwent CT-guided transthoracic needle biopsy 07/17/2015 that in discussion with the pathologist showed vasculitic features. I'm not sure if there was granuloma or not. 07/24/2015 she underwent limited autoimmune workup that showed screening p-ANCA positive (no c-anca, pr3 or mpo avail). Then 08/06/15 she had full panel with Dr Trudie Reed and per phone call  PR-3 and MPO antibody results were NEGATIVE and only P-ANCA was trace posiotive. 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 Patient then committed her to high-dose prednisone and also Rituxan once a week for 4 weeks infusion. She completed 3 weeks of this infusion he did she was supposed to get her fourth dose a little 10/01/2015 on 09/24/2015 but she missed it because of illness.  She believes ever since starting high-dose prednisone she started developing abdominal pain. She blames the high-dose prednisone. Only for her current problems. She does not  think Rituxan is the causative agent for this. She tells me that she started developing abdominal pain and finally got admitted 09/28/2015. A CT abdomen 09/23/2015 showed the right lower lobe mass at 2.6 cm perhaps smaller but obscured by a right pleural effusion that was small. But she had acute pancreatitis features with small pseudocyst.  Since admission the right pleural effusion got worse and she had a thoracentesis 09/29/2015 that showed idiopathic exudate with acute inflammatory cells and mesothelial cells with an LDH of 400. Thoracentesis was repeated 10/02/2015 with an LDH of 336. This is been complicated by small right basilar pneumothorax on chest x-ray today 10/03/2015 which radiology is conservatively following.  Given her pulmonary problems [facility] and has been consulted. She feels miserable because of a bowel pain and depressed and also has some Cushing features. She blames all her problems and ringing diffusion of bowel pain pancreatitis all and prednisone.   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  PAST SURGICAL HISTORY: She  has past surgical history that includes Cholecystectomy (03/29/09); Appendectomy (  03/29/81); Abdominal hysterectomy (03/29/81); Bladder  tack (03/29/2002); Other surgical history; Other surgical history; Cystoscopy (01/24/14); Colonoscopy w/ biopsies; and Esophagogastroduodenoscopy.  Allergies  Allergen Reactions  . Other Other (See Comments)    Please if giving pt anesthesia - she would like to have a scopoline patch to prevent nausea   . Ciprofloxacin Hives and Other (See Comments)    Blisters on legs  . Codeine Nausea And Vomiting and Other (See Comments)    Sweating, "passes out"  . Macrobid [employer] Macro] Other (See Comments)    Stool incontinence  . Morphine And Related Other (See Comments)    "hospital bed was shaking"  . Prevnar [Pneumococcal 13-Val Conj Vacc] Palpitations and Other (See Comments)    Pt c/o redness, swelling on arm after shot.  Pt c/o having respiratory problems and coughing ever since she got it.     No current facility-administered medications on file prior to encounter.   Current Outpatient Prescriptions on File Prior to Encounter  Medication Sig  . acetaminophen (TYLENOL) 500 MG tablet Take 500-1,000 mg by mouth every 8 (eight) hours as needed for mild pain or moderate pain.  . budesonide-formoterol (SYMBICORT) 160-4.5 MCG/ACT inhaler Inhale 2 puffs into the lungs 2 (two) times daily. (Patient taking differently: Inhale 2 puffs into the lungs 2 (two) times daily as needed (For shortness of breath.). )  . fexofenadine (ALLEGRA) 180 MG tablet Take 180 mg by mouth at bedtime.   . Melatonin 1 MG TABS Take 1 mg by mouth at bedtime.  . metoprolol succinate (TOPROL XL) 25 MG 24 hr tablet Take 1 tablet (25 mg total) by mouth at bedtime.  . metoprolol succinate (TOPROL-XL) 50 MG 24 hr tablet Take 1 tablet (50 mg total) by mouth daily. Take with or immediately following a meal.  . omeprazole (PRILOSEC) 20 MG capsule Take 1 capsule (20 mg total) by mouth 2 (two) times daily before a meal.  . ondansetron (ZOFRAN ODT) 8 MG disintegrating tablet Take 1 tablet (8 mg total) by mouth every 8  (eight) hours as needed for nausea or vomiting.  . riTUXimab in sodium chloride 0.9 % 250 mL Inject into the vein See admin instructions. She is receiving 4 doses with Dr. Gavin Pound at [facility]. Her next dose is due on 09/24/15.  Marland Kitchen albuterol (PROVENTIL) (2.5 MG/3ML) 0.083% nebulizer solution Take 3 mLs (2.5 mg total) by nebulization every 6 (six) hours as needed for wheezing or shortness of breath. (Patient not taking: Reported on 09/28/2015)  . diazepam (VALIUM) 5 MG tablet Take 0.5-1 tablets (2.5-5 mg total) by mouth every 12 (twelve) hours as needed for anxiety. (Patient not taking: Reported on 09/28/2015)  . fluconazole (DIFLUCAN) 100 MG tablet Take 1 tablet (100 mg total) by mouth daily. (Patient not taking: Reported on 09/28/2015)  . fluticasone furoate-vilanterol (BREO ELLIPTA) 100-25 MCG/INH AEPB Inhale 1 puff into the lungs daily. (Patient not taking: Reported on 09/28/2015)  . oxyCODONE (OXY IR/ROXICODONE) 5 MG immediate release tablet Take 1 tablet (5 mg total) by mouth every 6 (six) hours as needed for severe pain. (Patient not taking: Reported on 09/28/2015)  . [DISCONTINUED] potassium chloride (KLOR-CON) 10 MEQ CR tablet Take 1 tablet (10 mEq total) by mouth daily.    FAMILY HISTORY:  Her indicated that her mother is deceased. She indicated that her father is deceased. She indicated that two of her six brothers are alive.   SOCIAL HISTORY: She  reports that she has been smoking Cigarettes.  She has a 16.5 pack-year smoking history. She has never used smokeless tobacco. She reports that she does not drink alcohol or use illicit drugs.  REVIEW OF SYSTEMS:   11 point rOs per hpi - otherwise negative  VITAL SIGNS: BP 114/75 mmHg  Pulse 100  Temp(Src) 97.6 F (36.4 C) (Oral)  Resp 18  Ht 5\' 3"  (1.6 m)  Wt 69.5 kg (153 lb 3.5 oz)  BMI 27.15 kg/m2  SpO2 96%  HEMODYNAMICS:    VENTILATOR SETTINGS:    INTAKE / OUTPUT: I/O last 3 completed shifts: In: 2370 [P.O.:320;  I.V.:2050] Out: 1425 [Urine:1425]  PHYSICAL EXAMINATION: General:  Cushingoid female lying in bed and feeling miserable Psychiatry: Flat affect and feeling depressed Neuro:  Alert and oriented 3. Speech normal HEENT:  Cushingoid. No neck nodes no elevated JVP Cardiovascular:  Regular rate and rhythm no murmurs Lungs:  Clear to auscultation bilaterally without any crackles Abdomen:  Appears distended soft but nonspecific a tender Musculoskeletal:  No cyanosis clubbing edema Skin:  Appears intact in the exposed areas  LABS:  PULMONARY No results for input(s): PHART, PCO2ART, PO2ART, HCO3, TCO2, O2SAT in the last 168 hours.  Invalid input(s): PCO2, PO2  CBC  Recent Labs Lab 09/28/15 1934 09/29/15 0431 10/02/15 0415  HGB 12.3 11.1* 10.9*  HCT 34.6* 32.3* 31.7*  WBC 11.2* 9.0 8.2  PLT 194 174 185    COAGULATION  Recent Labs Lab 09/29/15 0015  INR 1.38    CARDIAC  No results for input(s): TROPONINI in the last 168 hours. No results for input(s): PROBNP in the last 168 hours.   CHEMISTRY  Recent Labs Lab 09/28/15 1934 09/29/15 0431 09/30/15 0903 10/01/15 0424 10/02/15 0415  NA 136 138 134* 136 137  K 3.3* 3.1* 3.6 4.3 4.6  CL 103 104 102 101 102  CO2 25 28 25 29 30   GLUCOSE 108* 123* 214* 130* 114*  BUN 9 8 6  5* 7  CREATININE 0.65 0.69 0.58 0.56 0.59  CALCIUM 8.3* 8.0* 8.0* 8.0* 8.3*  MG  --  1.8 2.0  --   --    Estimated Creatinine Clearance: 62 mL/min (by C-G formula based on Cr of 0.59).   LIVER  Recent Labs Lab 09/27/15 0532 09/28/15 1934 09/29/15 0015 10/01/15 0424  AST 23 28  --  27  ALT 44 54  --  35  ALKPHOS 75 88  --  76  BILITOT 0.7 0.4  --  0.8  PROT 5.0* 5.2*  --  4.5*  ALBUMIN 2.2* 2.4*  --  2.0*  INR  --   --  1.38  --      INFECTIOUS  Recent Labs Lab 09/28/15 2001  LATICACIDVEN 1.62     ENDOCRINE CBG (last 3)   Recent Labs  10/01/15 0748 10/02/15 0749 10/03/15 0744  GLUCAP 134* 117* 171*          IMAGING x48h  - image(s) personally visualized  -   highlighted in bold Dg Chest 1 View  10/03/2015  CLINICAL DATA:  Follow-up thoracentesis ; previously identified right-sided basilar pneumothorax. EXAM: CHEST 1 VIEW COMPARISON:  Chest x-ray of October 02, 2015 FINDINGS: Again demonstrated is a small right basilar pneumothorax. It appears stable in volume. No significant apical component is observed. There is no mediastinal shift. There is a small left pleural effusion with minimal left basilar atelectasis. The heart and pulmonary vascularity are normal. IMPRESSION: Stable small right sided basilar pneumothorax. Stable left lower lobe atelectasis and small  effusion. Electronically Signed   By: David  Martinique M.D.   On: 10/03/2015 07:44   Dg Chest 1 View  10/02/2015  CLINICAL DATA:  RIGHT side pneumothorax postprocedural EXAM: CHEST 1 VIEW COMPARISON:  Portable exam 1845 hours compared to 1553 hours FINDINGS: Slight decrease in size of previously identified RIGHT basilar pneumothorax. Stable heart size and mediastinal contours. Bibasilar atelectasis and small LEFT pleural effusion. Bones unremarkable. IMPRESSION: Slight decrease in size of previously identified RIGHT basilar pneumothorax. Electronically Signed   By: Lavonia Dana M.D.   On: 10/02/2015 18:55   Dg Chest 1 View  10/02/2015  CLINICAL DATA:  Post right thoracentesis EXAM: CHEST 1 VIEW COMPARISON:  7/6/ 17 FINDINGS: Cardiomediastinal silhouette is stable. Mild right basilar atelectasis. Right pleural effusions has resolved. Trace left pleural effusion with left basilar atelectasis. There is a small loculated right basilar pneumothorax measure about 1.3 cm maximum thickness. IMPRESSION: Small loculated right basilar pneumothorax measures about 1.3 cm maximum thickness. Electronically Signed   By: Lahoma Crocker M.D.   On: 10/02/2015 16:05   Dg Chest 2 View  10/02/2015  CLINICAL DATA:  Dyspnea, history of asthma, COPD, coronary artery disease,  current smoker. EXAM: CHEST  2 VIEW COMPARISON:  Chest x-ray of October 01, 2015 FINDINGS: There has been interval increase in the volume of pleural fluid on the right which now occupies approximately 1/4 of the pleural space volume. There is no mediastinal shift. The heart is normal in size. The pulmonary vascularity is not engorged. The small left pleural effusion is stable. IMPRESSION: Mild further accumulation of pleural fluid on the right. Stable small left pleural effusion. Electronically Signed   By: David  Martinique M.D.   On: 10/02/2015 08:37   US Thoracentesis Asp Pleural Space W/img Guide  10/02/2015  INDICATION: Patient with recurrent right pleural effusion. Request is made for repeat diagnostic and therapeutic thoracentesis. EXAM: ULTRASOUND GUIDED DIAGNOSTIC AND THERAPEUTIC THORACENTESIS MEDICATIONS: 1% lidocaine with epinephrine. COMPLICATIONS: None immediate. PROCEDURE: An ultrasound guided thoracentesis was thoroughly discussed with the patient and questions answered. The benefits, risks, alternatives and complications were also discussed. The patient understands and wishes to proceed with the procedure. Written consent was obtained. Ultrasound was performed to localize and mark an adequate pocket of fluid in the right chest. The area was then prepped and draped in the normal sterile fashion. 1% Lidocaine with epi was used for local anesthesia. Under ultrasound guidance a Safe-T-Centesis catheter was introduced. Thoracentesis was performed. The catheter was removed and a dressing applied. FINDINGS: A total of approximately 0.93 L of yellow fluid was removed. Samples were sent to the laboratory as requested by the clinical team. IMPRESSION: Successful ultrasound guided right thoracentesis yielding 0.93 L of pleural fluid. Read by: Saverio Danker, PA-C Electronically Signed   By: Lucrezia Europe M.D.   On: 10/02/2015 16:06      ASSESSMENT / PLAN:  PULMONARY A: -Right lower lobe mass March 2017 along  with microscopic hematuria. - ANCA-NEGATIVE  - diagnosis of vasculitis/ GPA made based on April 2017 biopsy (results do not describe granuloma though)  - Status post prednisone ending June 2017 due to intolerance and 3 out of 4 doses of weekly Rituxan ending June 2017 due to current admission.  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  - Cause unknown: She is status post cholecystectomy  - Rituxan can cause abdominal pain and GI perforation up to 14% of cases but pancreatitis  itself is not reported. In my personal experience prednisone does not cause pancreatitis  - Acute exudative right pleural effusion 09/29/15 idiopathic with acute inflammatory cells following the diagnosis o pancreatitis this admission  - this is likely reactive to the pancreatitis  - Status post small right pneumothorax  -  following second thoracentesis of the right pleural effusion on 10/02/2015``  P:   - Get CT chest without contrast 10/03/2015: Good time to get it given status post thoracentesis - Recheck  vasculitis antibody especially MPO &PR 3 and GBM - this is not useful in monitoring disease course but this to confirm the diagnosis of GPA (so far she is ANCA-negative)   - If GBM +  And she gets sicker might need PLEX   - if PR-3 +/MPO + - more confident that RLL cavitating mass is indeed c/w GPA/MPO disease   - If PR-3 -/MPO - -> and mass is persistent -> we might need to resect it to ensure definite vasculitis (? CT bx - false +) when she is better from pacreatitis esp because she does not want to Rx with prednisone ever again.     - PTX -mgmt per IR  - Pancreatitis: Patient blames this on prednisone - I doubt - but abd. Gi issues well reported with rituxan (though not pancreatitis); we will have to ultimately see after her pancreatitis how to treat her moving forward    PCCM will round aagain 10/04/15   Dr. Brand Males,  M.D., [facility].C.P Pulmonary and Critical Care Medicine Staff Physician [facility] Pager: [phone], If no answer or between  15:00h - 7:00h: call [phone]  10/03/2015 10:40 AM

## 2015-10-03 NOTE — Progress Notes (Signed)
Physical Therapy Treatment Patient Details Name: [name] [name] MRN: [id number] DOB: 1945/04/18 Today's Date: 10/03/2015    History of Present Illness 70 y.o. female with medical history significant of COPD, asthma, tobacco abuse, hyperlipidemia, GERD, depression, anxiety, chronic diarrhea, CAD, granulomatosis with polyangiitis, recent admission due to acute pancreatitis with pseudocyst and admitted 09/28/15 for acute on chronic respiratory failure with hypoxia    PT Comments    Pt continues to participate well with therapy. C/o not being able to take a deep breath on today. O2 sats 90% on RA during activity. Will continue to follow.   Follow Up Recommendations  Home health PT     Equipment Recommendations  Rolling walker with 5" wheels    Recommendations for Other Services       Precautions / Restrictions Precautions Precautions: Fall Precaution Comments: monitor sats Restrictions Weight Bearing Restrictions: No    Mobility  Bed Mobility Overal bed mobility: Modified Independent                Transfers Overall transfer level: Needs assistance Equipment used: Rolling walker (2 wheeled);None Transfers: Sit to/from Stand Sit to Stand: Supervision         General transfer comment: for safety  Ambulation/Gait Ambulation/Gait assistance: Min guard Ambulation Distance (Feet): 65 Feet Assistive device: Rolling walker (2 wheeled) Gait Pattern/deviations: Step-through pattern;Decreased stride length     General Gait Details: close guard for safety. Tremors noted. O2 sats 90% on RA during activity. Dyspnea 2/4. Pt states she feels she cannot take a deep breath. Also walked ~15 feet while holding onto IV pole.    Stairs            Wheelchair Mobility    Modified Rankin (Stroke Patients Only)       Balance                                    Cognition Arousal/Alertness: Awake/alert Behavior During Therapy: WFL for tasks  assessed/performed Overall Cognitive Status: Within Functional Limits for tasks assessed                      Exercises      General Comments        Pertinent Vitals/Pain Pain Assessment: Faces Faces Pain Scale: Hurts little more Pain Location: abdomen, L hip Pain Descriptors / Indicators: Sore;Aching Pain Intervention(s): Monitored during session    Home Living                      Prior Function            PT Goals (current goals can now be found in the care plan section) Progress towards PT goals: Progressing toward goals    Frequency  Min 3X/week    PT Plan Current plan remains appropriate    Co-evaluation             End of Session   Activity Tolerance: Patient tolerated treatment well Patient left: in bed;with call bell/phone within reach;with bed alarm set     Time: [id number] PT Time Calculation (min) (ACUTE ONLY): 15 min  Charges:  $Gait Training: 8-22 mins                    G Codes:      Weston Anna, MPT Pager: [phone]

## 2015-10-03 NOTE — Progress Notes (Signed)
PROGRESS NOTE    [name] [name]  [id number] DOB: 11/27/1945 DOA: 09/28/2015 PCP: [name], MD   Brief Narrative: 70 y.o. female with medical history significant of COPD, asthma, tobacco abuse, hyperlipidemia, GERD, depression, anxiety, chronic diarrhea, CAD, granulomatosis with polyangiitis, recent admission due to acute pancreatitis with pseudocyst. Improved abdominal pain but found recurrent exudative pleural effusion.   Assessment & Plan:   Principal Problem:   Acute on chronic respiratory failure with hypoxia (HCC) Active Problems:   Hereditary hemochromatosis (HCC)   Essential hypertension   GERD   Asthma   CAD (coronary artery disease)   History of tobacco abuse   Hypokalemia   COPD (chronic obstructive pulmonary disease) with emphysema (HCC)   Other mixed anxiety disorders   Wegener's granulomatosis with renal involvement (HCC)   Pancreatitis   Pancreatic pseudocyst   Bilateral pleural effusion   UTI (lower urinary tract infection)   Pleural effusion, right   Cavitating mass in right lower lung lobe   Wegener's granulomatosis (granulomatosis with polyangiitis) ([place])   1. Cardiovascular. HTN. Will continue blood pressure control with metoprolol. Blood pressure has been stable.   2. Pulmonary. Right pleural effusion. Exudate per Light's criteria. Follow thoracentesis with 0.9 lt removed. Pulmonary consulted and CT chest order to further evaluate cavitary lesion that may be associated with Wegner's.  Small post thoracentesis pneumothorax. Will continue supportive care with supplemental 02 per  and continue oxymetry monitor.    3. Nephrology. Will follow renal panel in am. Will continue to hold on IV fluids for now.   4. GI. Pancreatic pseudocyst.  Improved pain and toleration to po intake, will continue supportive care and pain control.   Patient continue at moderate risk for worsening pleural effusion.   DVT prophylaxis: lovenox Code Status: full Family  Communication:  Disposition Plan:   Consultants:   Pulmonary   Procedures:   Thoracentesis 07/05  Antimicrobials:  Subjective: Patient with pain and dyspnea improved after thoracentesis on the right. No nausea or vomiting and tolerating po well. No fever or chills.   Objective: Filed Vitals:   10/02/15 2044 10/02/15 2047 10/03/15 0630 10/03/15 0745  BP:   114/75   Pulse:   100   Temp:   97.6 F (36.4 C)   TempSrc:   Oral   Resp:   18   Height:      Weight:      SpO2: 91% 91% 98% 96%    Intake/Output Summary (Last 24 hours) at 10/03/15 1324 Last data filed at 10/03/15 0630  Gross per 24 hour  Intake   1200 ml  Output   1350 ml  Net   -150 ml   Filed Weights   09/28/15 2353  Weight: 69.5 kg (153 lb 3.5 oz)    Examination:  General exam: deconditioned and ill looking appearing. E ENT: mild conjunctival pallor, oral mucosa moist. Respiratory system: . Respiratory effort normal. Mild decreased breath sounds at bases, no wheezing, rales or rhonchi.  Cardiovascular system: S1 & S2 heard, RRR. No JVD, murmurs, rubs, gallops or clicks. No pedal edema. Gastrointestinal system: Abdomen is nondistended, soft and nontender. No organomegaly or masses felt. Normal bowel sounds heard. Central nervous system: Alert and oriented. No focal neurological deficits. Extremities: Symmetric 5 x 5 power. Skin: No rashes, lesions or ulcers Psychiatry: Judgement and insight appear normal. Mood & affect appropriate.     Data Reviewed: I have personally reviewed following labs and imaging studies  CBC:  Recent Labs Lab  09/28/15 1934 09/29/15 0431 10/02/15 0415  WBC 11.2* 9.0 8.2  NEUTROABS 9.1*  --  6.2  HGB 12.3 11.1* 10.9*  HCT 34.6* 32.3* 31.7*  MCV 93.5 95.3 96.1  PLT 194 174 [id number]   Basic Metabolic Panel:  Recent Labs Lab 09/28/15 1934 09/29/15 0431 09/30/15 0903 10/01/15 0424 10/02/15 0415  NA 136 138 134* 136 137  K 3.3* 3.1* 3.6 4.3 4.6  CL 103 104 102 101  102  CO2 25 28 25 29 30   GLUCOSE 108* 123* 214* 130* 114*  BUN 9 8 6  5* 7  CREATININE 0.65 0.69 0.58 0.56 0.59  CALCIUM 8.3* 8.0* 8.0* 8.0* 8.3*  MG  --  1.8 2.0  --   --    GFR: Estimated Creatinine Clearance: 62 mL/min (by C-G formula based on Cr of 0.59). Liver Function Tests:  Recent Labs Lab 09/27/15 0532 09/28/15 1934 10/01/15 0424  AST 23 28 27   ALT 44 54 35  ALKPHOS 75 88 76  BILITOT 0.7 0.4 0.8  PROT 5.0* 5.2* 4.5*  ALBUMIN 2.2* 2.4* 2.0*    Recent Labs Lab 09/27/15 0532 09/28/15 1943 10/01/15 0424  LIPASE 97* 93* 66*   No results for input(s): AMMONIA in the last 168 hours. Coagulation Profile:  Recent Labs Lab 09/29/15 0015  INR 1.38   Cardiac Enzymes: No results for input(s): CKTOTAL, CKMB, CKMBINDEX, TROPONINI in the last 168 hours. BNP (last 3 results) No results for input(s): PROBNP in the last 8760 hours. HbA1C: No results for input(s): HGBA1C in the last 72 hours. CBG:  Recent Labs Lab 09/29/15 0809 09/30/15 0812 10/01/15 0748 10/02/15 0749 10/03/15 0744  GLUCAP 128* 156* 134* 117* 171*   Lipid Profile: No results for input(s): CHOL, HDL, LDLCALC, TRIG, CHOLHDL, LDLDIRECT in the last 72 hours. Thyroid Function Tests: No results for input(s): TSH, T4TOTAL, FREET4, T3FREE, THYROIDAB in the last 72 hours. Anemia Panel: No results for input(s): VITAMINB12, FOLATE, FERRITIN, TIBC, IRON, RETICCTPCT in the last 72 hours. Sepsis Labs:  Recent Labs Lab 09/28/15 2001  LATICACIDVEN 1.62    Recent Results (from the past 240 hour(s))  Urine culture     Status: Abnormal   Collection Time: 09/28/15  7:46 PM  Result Value Ref Range Status   Specimen Description URINE, RANDOM  Final   Special Requests NONE  Final   Culture MULTIPLE SPECIES PRESENT, SUGGEST RECOLLECTION (A)  Final   Report Status 09/30/2015 FINAL  Final  Culture, Urine     Status: Abnormal   Collection Time: 09/29/15 11:48 AM  Result Value Ref Range Status   Specimen  Description URINE, CLEAN CATCH  Final   Special Requests Normal  Final   Culture MULTIPLE SPECIES PRESENT, SUGGEST RECOLLECTION (A)  Final   Report Status 09/30/2015 FINAL  Final  Culture, body fluid-bottle     Status: None (Preliminary result)   Collection Time: 09/29/15  4:48 PM  Result Value Ref Range Status   Specimen Description FLUID RIGHT PLEURAL  Final   Special Requests BOTTLES DRAWN AEROBIC AND ANAEROBIC 10CC  Final   Culture   Final    NO GROWTH 3 DAYS Performed at [facility]    Report Status PENDING  Incomplete  Gram stain     Status: None   Collection Time: 09/29/15  4:48 PM  Result Value Ref Range Status   Specimen Description FLUID RIGHT PLEURAL  Final   Special Requests NONE  Final   Gram Stain   Final  MODERATE WBC PRESENT,BOTH PMN AND MONONUCLEAR NO ORGANISMS SEEN Performed at [facility]    Report Status 09/29/2015 FINAL  Final  Gram stain     Status: None   Collection Time: 10/02/15  3:54 PM  Result Value Ref Range Status   Specimen Description FLUID PLEURAL  Final   Special Requests NONE  Final   Gram Stain   Final    MODERATE WBC PRESENT,BOTH PMN AND MONONUCLEAR NO ORGANISMS SEEN Performed at [facility]    Report Status 10/02/2015 FINAL  Final         Radiology Studies: Dg Chest 1 View  10/03/2015  CLINICAL DATA:  Follow-up thoracentesis ; previously identified right-sided basilar pneumothorax. EXAM: CHEST 1 VIEW COMPARISON:  Chest x-ray of October 02, 2015 FINDINGS: Again demonstrated is a small right basilar pneumothorax. It appears stable in volume. No significant apical component is observed. There is no mediastinal shift. There is a small left pleural effusion with minimal left basilar atelectasis. The heart and pulmonary vascularity are normal. IMPRESSION: Stable small right sided basilar pneumothorax. Stable left lower lobe atelectasis and small effusion. Electronically Signed   By: David  Martinique M.D.   On: 10/03/2015  07:44   Dg Chest 1 View  10/02/2015  CLINICAL DATA:  RIGHT side pneumothorax postprocedural EXAM: CHEST 1 VIEW COMPARISON:  Portable exam 1845 hours compared to 1553 hours FINDINGS: Slight decrease in size of previously identified RIGHT basilar pneumothorax. Stable heart size and mediastinal contours. Bibasilar atelectasis and small LEFT pleural effusion. Bones unremarkable. IMPRESSION: Slight decrease in size of previously identified RIGHT basilar pneumothorax. Electronically Signed   By: Lavonia Dana M.D.   On: 10/02/2015 18:55   Dg Chest 1 View  10/02/2015  CLINICAL DATA:  Post right thoracentesis EXAM: CHEST 1 VIEW COMPARISON:  7/6/ 17 FINDINGS: Cardiomediastinal silhouette is stable. Mild right basilar atelectasis. Right pleural effusions has resolved. Trace left pleural effusion with left basilar atelectasis. There is a small loculated right basilar pneumothorax measure about 1.3 cm maximum thickness. IMPRESSION: Small loculated right basilar pneumothorax measures about 1.3 cm maximum thickness. Electronically Signed   By: Lahoma Crocker M.D.   On: 10/02/2015 16:05   Dg Chest 2 View  10/02/2015  CLINICAL DATA:  Dyspnea, history of asthma, COPD, coronary artery disease, current smoker. EXAM: CHEST  2 VIEW COMPARISON:  Chest x-ray of October 01, 2015 FINDINGS: There has been interval increase in the volume of pleural fluid on the right which now occupies approximately 1/4 of the pleural space volume. There is no mediastinal shift. The heart is normal in size. The pulmonary vascularity is not engorged. The small left pleural effusion is stable. IMPRESSION: Mild further accumulation of pleural fluid on the right. Stable small left pleural effusion. Electronically Signed   By: David  Martinique M.D.   On: 10/02/2015 08:37   US Thoracentesis Asp Pleural Space W/img Guide  10/02/2015  INDICATION: Patient with recurrent right pleural effusion. Request is made for repeat diagnostic and therapeutic thoracentesis. EXAM:  ULTRASOUND GUIDED DIAGNOSTIC AND THERAPEUTIC THORACENTESIS MEDICATIONS: 1% lidocaine with epinephrine. COMPLICATIONS: None immediate. PROCEDURE: An ultrasound guided thoracentesis was thoroughly discussed with the patient and questions answered. The benefits, risks, alternatives and complications were also discussed. The patient understands and wishes to proceed with the procedure. Written consent was obtained. Ultrasound was performed to localize and mark an adequate pocket of fluid in the right chest. The area was then prepped and draped in the normal sterile fashion. 1% Lidocaine with epi  was used for local anesthesia. Under ultrasound guidance a Safe-T-Centesis catheter was introduced. Thoracentesis was performed. The catheter was removed and a dressing applied. FINDINGS: A total of approximately 0.93 L of yellow fluid was removed. Samples were sent to the laboratory as requested by the clinical team. IMPRESSION: Successful ultrasound guided right thoracentesis yielding 0.93 L of pleural fluid. Read by: Saverio Danker, PA-C Electronically Signed   By: Lucrezia Europe M.D.   On: 10/02/2015 16:06        Scheduled Meds: . diatrizoate meglumine-sodium  30 mL Oral Once  . ipratropium  0.5 mg Nebulization BID  . levalbuterol  1.25 mg Nebulization BID  . lidocaine  1 patch Transdermal Q24H  . loratadine  10 mg Oral Daily  . metoprolol succinate  25 mg Oral QHS  . metoprolol succinate  50 mg Oral Daily  . mometasone-formoterol  2 puff Inhalation BID  . nicotine  21 mg Transdermal Daily  . pantoprazole  40 mg Oral BID   Continuous Infusions: . sodium chloride 50 mL/hr at 10/01/15 2000     LOS: 4 days        Tawni Millers, MD [facility] Pager [phone]  If 7PM-7AM, please contact night-coverage [email or web address] Password [email or web address] 10/03/2015, 1:24 PM

## 2015-10-03 NOTE — Progress Notes (Addendum)
Occupational Therapy Treatment Patient Details Name: [name] [name] MRN: [id number] DOB: 1945/10/16 Today's Date: 10/03/2015    History of present illness 70 y.o. female with medical history significant of COPD, asthma, tobacco abuse, hyperlipidemia, GERD, depression, anxiety, chronic diarrhea, CAD, granulomatosis with polyangiitis, recent admission due to acute pancreatitis with pseudocyst and admitted 09/28/15 for acute on chronic respiratory failure with hypoxia   OT comments  Pt making good progress with OT. She has grab bar by toilet and tub; no DME needs  Follow Up Recommendations  Home health OT    Equipment Recommendations  None recommended by OT    Recommendations for Other Services      Precautions / Restrictions Precautions Precautions: Fall Precaution Comments: monitor sats Restrictions Weight Bearing Restrictions: No       Mobility Bed Mobility Overal bed mobility: Modified Independent                Transfers Overall transfer level: Needs assistance Equipment used: Rolling walker (2 wheeled);None Transfers: Sit to/from Stand Sit to Stand: Supervision         General transfer comment: cues for UEs for safety with RW    Balance                                   ADL       Grooming: Wash/dry hands;Supervision/safety;Standing                   Toilet Transfer: Min guard;Ambulation;RW             General ADL Comments: pt did not want to practice shower transfer at this time; does not think she will have any trouble.  Ambulated in hall at her request (2nd time today)  Reviewed energy conservation and gave handout.  sats 93% on RA.  Made two trips to the bathroom      Vision                     Perception     Praxis      Cognition   Behavior During Therapy: [facility] for tasks assessed/performed Overall Cognitive Status: Within Functional Limits for tasks assessed                        Extremity/Trunk Assessment               Exercises     Shoulder Instructions       General Comments      Pertinent Vitals/ Pain       Pain Assessment: Faces Faces Pain Scale: Hurts little more Pain Location: abdomen/side Pain Descriptors / Indicators: Aching Pain Intervention(s): Limited activity within patient's tolerance;Monitored during session;Repositioned  Home Living                                          Prior Functioning/Environment              Frequency Min 2X/week     Progress Toward Goals  OT Goals(current goals can now be found in the care plan section)  Progress towards OT goals: Progressing toward goals     Plan      Co-evaluation                 End of  Session     Activity Tolerance Patient tolerated treatment well   Patient Left in bed;with call bell/phone within reach;with bed alarm set   Nurse Communication          Time: [id number] - 5 minutes on phone OT Time Calculation (min): 35 min  Charges: OT General Charges $OT Visit: 1 Procedure OT Treatments $Self Care/Home Management : 8-22 mins $Therapeutic Activity: 8-22 mins  [name] [name] 10/03/2015, 1:39 PM Lesle Chris, OTR/L [phone] 10/03/2015

## 2015-10-03 NOTE — Progress Notes (Signed)
Patient ID: [name] [name], female   DOB: 05-Feb-1946, 70 y.o.   MRN: [id number] Pt s/p second rt thoracentesis yesterday Had small rt basilar ptx postprocedure Currently without sig dyspnea or CP VSS; AF; O2 sats 96% 1 liter N/C CXR today stable For CT chest today per pulmonology

## 2015-10-03 NOTE — Progress Notes (Signed)
OT Cancellation Note  Patient Details Name: [name] [name] MRN: [id number] DOB: 1945-06-16   Cancelled Treatment:    Reason Eval/Treat Not Completed: Other (comment).  Pt with nausea:  Will check back.  [name] [name] 10/03/2015, 8:51 AM  Lesle Chris, OTR/L [phone] 10/03/2015

## 2015-10-03 NOTE — Telephone Encounter (Signed)
  I spoke with pt's daughter Cristal Ford and updated her on pt's condition.  AD

## 2015-10-04 ENCOUNTER — Inpatient Hospital Stay (HOSPITAL_COMMUNITY): Payer: Medicare Other

## 2015-10-04 DIAGNOSIS — I10 Essential (primary) hypertension: Secondary | ICD-10-CM

## 2015-10-04 LAB — GLUCOSE, CAPILLARY: Glucose-Capillary: 128 mg/dL — ABNORMAL HIGH (ref 65–99)

## 2015-10-04 LAB — CULTURE, BODY FLUID-BOTTLE: CULTURE: NO GROWTH

## 2015-10-04 LAB — CBC WITH DIFFERENTIAL/PLATELET
Basophils Absolute: 0 10*3/uL (ref 0.0–0.1)
Basophils Relative: 1 %
EOS PCT: 1 %
Eosinophils Absolute: 0.1 10*3/uL (ref 0.0–0.7)
HCT: 33 % — ABNORMAL LOW (ref 36.0–46.0)
Hemoglobin: 11 g/dL — ABNORMAL LOW (ref 12.0–15.0)
LYMPHS ABS: 1.4 10*3/uL (ref 0.7–4.0)
LYMPHS PCT: 16 %
MCH: 32 pg (ref 26.0–34.0)
MCHC: 33.3 g/dL (ref 30.0–36.0)
MCV: 95.9 fL (ref 78.0–100.0)
MONO ABS: 1 10*3/uL (ref 0.1–1.0)
MONOS PCT: 12 %
Neutro Abs: 6.2 10*3/uL (ref 1.7–7.7)
Neutrophils Relative %: 70 %
PLATELETS: 227 10*3/uL (ref 150–400)
RBC: 3.44 MIL/uL — ABNORMAL LOW (ref 3.87–5.11)
RDW: 15.4 % (ref 11.5–15.5)
WBC: 8.7 10*3/uL (ref 4.0–10.5)

## 2015-10-04 LAB — BASIC METABOLIC PANEL
Anion gap: 6 (ref 5–15)
BUN: 8 mg/dL (ref 6–20)
CALCIUM: 8.2 mg/dL — AB (ref 8.9–10.3)
CO2: 29 mmol/L (ref 22–32)
Chloride: 102 mmol/L (ref 101–111)
Creatinine, Ser: 0.49 mg/dL (ref 0.44–1.00)
GFR calc Af Amer: >60 mL/min (ref >60)
GLUCOSE: 111 mg/dL — AB (ref 65–99)
Potassium: 3.5 mmol/L (ref 3.5–5.1)
Sodium: 137 mmol/L (ref 135–145)

## 2015-10-04 LAB — CULTURE, BODY FLUID W GRAM STAIN -BOTTLE

## 2015-10-04 MED ORDER — POTASSIUM CHLORIDE CRYS ER 20 MEQ PO TBCR
40.0000 meq | EXTENDED_RELEASE_TABLET | Freq: Once | ORAL | Status: AC
  Administered 2015-10-04: 40 meq via ORAL
  Filled 2015-10-04: qty 2

## 2015-10-04 NOTE — Progress Notes (Signed)
Patient ID: [name] [name], female   DOB: 1945-09-08, 70 y.o.   MRN: [id number]    Referring Physician(s): none  Supervising Physician: Marybelle Killings  Patient Status: inpt  Chief Complaint: PTX s/p thoracentesis  Subjective: Patient doesn't feel well today.  Having epigastric and back pain secondary to her pancreatitis.  No SOB or trouble breathing.  Allergies: Other; Ciprofloxacin; Codeine; Macrobid; Morphine and related; and Prevnar  Medications: Prior to Admission medications   Medication Sig Start Date End Date Taking? Authorizing Provider  acetaminophen (TYLENOL) 500 MG tablet Take 500-1,000 mg by mouth every 8 (eight) hours as needed for mild pain or moderate pain.   Yes Historical Provider, MD  aspirin 81 MG tablet Take 362 mg by mouth once.   Yes Historical Provider, MD  budesonide-formoterol (SYMBICORT) 160-4.5 MCG/ACT inhaler Inhale 2 puffs into the lungs 2 (two) times daily. Patient taking differently: Inhale 2 puffs into the lungs 2 (two) times daily as needed (For shortness of breath.).  07/30/15  Yes Jose Angelo A Corrie Dandy, MD  fexofenadine (ALLEGRA) 180 MG tablet Take 180 mg by mouth at bedtime.    Yes Historical Provider, MD  loperamide (IMODIUM) 2 MG capsule Take 2 mg by mouth as needed for diarrhea or loose stools.   Yes Historical Provider, MD  Melatonin 1 MG TABS Take 1 mg by mouth at bedtime.   Yes Historical Provider, MD  metoprolol succinate (TOPROL XL) 25 MG 24 hr tablet Take 1 tablet (25 mg total) by mouth at bedtime. 09/19/15  Yes Burtis Junes, NP  metoprolol succinate (TOPROL-XL) 50 MG 24 hr tablet Take 1 tablet (50 mg total) by mouth daily. Take with or immediately following a meal. 08/28/15  Yes Rhonda G Barrett, PA-C  omeprazole (PRILOSEC) 20 MG capsule Take 1 capsule (20 mg total) by mouth 2 (two) times daily before a meal. 09/27/15  Yes Barton Dubois, MD  ondansetron (ZOFRAN ODT) 8 MG disintegrating tablet Take 1 tablet (8 mg total) by mouth every 8 (eight)  hours as needed for nausea or vomiting. 09/27/15  Yes Barton Dubois, MD  riTUXimab in sodium chloride 0.9 % 250 mL Inject into the vein See admin instructions. She is receiving 4 doses with Dr. Gavin Pound at [facility]. Her next dose is due on 09/24/15.   Yes Historical Provider, MD  simethicone (MYLICON) 0000000 MG chewable tablet Chew 125 mg by mouth every 6 (six) hours as needed for flatulence.   Yes Historical Provider, MD  sulfamethoxazole-trimethoprim (BACTRIM DS,SEPTRA DS) 800-160 MG tablet Take 1 tablet by mouth 3 (three) times a week. 09/08/15  Yes Historical Provider, MD  albuterol (PROVENTIL) (2.5 MG/3ML) 0.083% nebulizer solution Take 3 mLs (2.5 mg total) by nebulization every 6 (six) hours as needed for wheezing or shortness of breath. Patient not taking: Reported on 09/28/2015 05/28/14   Hendricks Limes, MD  diazepam (VALIUM) 5 MG tablet Take 0.5-1 tablets (2.5-5 mg total) by mouth every 12 (twelve) hours as needed for anxiety. Patient not taking: Reported on 09/28/2015 08/08/15   Biagio Borg, MD  fluconazole (DIFLUCAN) 100 MG tablet Take 1 tablet (100 mg total) by mouth daily. Patient not taking: Reported on 09/28/2015 09/27/15   Barton Dubois, MD  fluticasone furoate-vilanterol (BREO ELLIPTA) 100-25 MCG/INH AEPB Inhale 1 puff into the lungs daily. Patient not taking: Reported on 09/28/2015 07/30/15   Rush Landmark, MD  oxyCODONE (OXY IR/ROXICODONE) 5 MG immediate release tablet Take 1 tablet (5 mg total) by  mouth every 6 (six) hours as needed for severe pain. Patient not taking: Reported on 09/28/2015 09/27/15   Barton Dubois, MD    Vital Signs: BP 120/65 mmHg  Pulse 100  Temp(Src) 98.1 F (36.7 C) (Oral)  Resp 18  Ht 5\' 3"  (1.6 m)  Wt 153 lb 3.5 oz (69.5 kg)  BMI 27.15 kg/m2  SpO2 96%  Physical Exam: Chest: CTAB, good air movement, but slightly decreased on right side  Imaging: Dg Chest 1 View  10/03/2015  CLINICAL DATA:  Follow-up thoracentesis ; previously identified  right-sided basilar pneumothorax. EXAM: CHEST 1 VIEW COMPARISON:  Chest x-ray of October 02, 2015 FINDINGS: Again demonstrated is a small right basilar pneumothorax. It appears stable in volume. No significant apical component is observed. There is no mediastinal shift. There is a small left pleural effusion with minimal left basilar atelectasis. The heart and pulmonary vascularity are normal. IMPRESSION: Stable small right sided basilar pneumothorax. Stable left lower lobe atelectasis and small effusion. Electronically Signed   By: David  Martinique M.D.   On: 10/03/2015 07:44   Dg Chest 1 View  10/02/2015  CLINICAL DATA:  RIGHT side pneumothorax postprocedural EXAM: CHEST 1 VIEW COMPARISON:  Portable exam 1845 hours compared to 1553 hours FINDINGS: Slight decrease in size of previously identified RIGHT basilar pneumothorax. Stable heart size and mediastinal contours. Bibasilar atelectasis and small LEFT pleural effusion. Bones unremarkable. IMPRESSION: Slight decrease in size of previously identified RIGHT basilar pneumothorax. Electronically Signed   By: Lavonia Dana M.D.   On: 10/02/2015 18:55   Dg Chest 1 View  10/02/2015  CLINICAL DATA:  Post right thoracentesis EXAM: CHEST 1 VIEW COMPARISON:  7/6/ 17 FINDINGS: Cardiomediastinal silhouette is stable. Mild right basilar atelectasis. Right pleural effusions has resolved. Trace left pleural effusion with left basilar atelectasis. There is a small loculated right basilar pneumothorax measure about 1.3 cm maximum thickness. IMPRESSION: Small loculated right basilar pneumothorax measures about 1.3 cm maximum thickness. Electronically Signed   By: Lahoma Crocker M.D.   On: 10/02/2015 16:05   Dg Chest 2 View  10/02/2015  CLINICAL DATA:  Dyspnea, history of asthma, COPD, coronary artery disease, current smoker. EXAM: CHEST  2 VIEW COMPARISON:  Chest x-ray of October 01, 2015 FINDINGS: There has been interval increase in the volume of pleural fluid on the right which now occupies  approximately 1/4 of the pleural space volume. There is no mediastinal shift. The heart is normal in size. The pulmonary vascularity is not engorged. The small left pleural effusion is stable. IMPRESSION: Mild further accumulation of pleural fluid on the right. Stable small left pleural effusion. Electronically Signed   By: David  Martinique M.D.   On: 10/02/2015 08:37   Dg Chest 2 View  10/01/2015  CLINICAL DATA:  Right-sided chest pain, hypoxia, former smoking history EXAM: CHEST  2 VIEW COMPARISON:  Chest x-ray of 09/29/2015 and CT chest of 06/26/2015 FINDINGS: There has been reaccumulation of a moderate size right pleural effusion with a small left pleural effusion present. Bibasilar atelectasis remains right greater than left. Heart size is stable. No bony abnormality is seen. IMPRESSION: Reaccumulation of moderate size right pleural effusion with small left pleural effusion remaining. Electronically Signed   By: Ivar Drape M.D.   On: 10/01/2015 08:44   Ct Chest Wo Contrast  10/03/2015  CLINICAL DATA:  Acute pancreatitis, acute on chronic respiratory failure with hypoxia, right lower lobe lung mass EXAM: CT CHEST WITHOUT CONTRAST TECHNIQUE: Multidetector CT imaging of the chest  was performed following the standard protocol without IV contrast. COMPARISON:  Chest radiograph dated 10/03/2015. Partial comparison to CT abdomen pelvis dated 09/29/2015. PET-CT dated 07/07/2015. CT chest dated 06/26/2015. FINDINGS: Cardiovascular: The heart is normal in size. No pericardial effusion. Coronary atherosclerosis of the LAD. Atherosclerotic calcifications of the aortic arch. Mediastinum/Nodes: Small mediastinal lymph nodes which do not meet pathologic CT size criteria. No suspicious axillary lymphadenopathy. Visualized thyroid is unremarkable. Lungs/Pleura: Moderate right hydropneumothorax. Abnormal pleural thickening in the right hemithorax with additional scattered areas of loculated pleural fluid in the right  hemithorax, including along the right major fissure. No convincing solid pleural component. Irregular 14 mm subpleural nodule in the left upper lobe (series 5/images 18-19), new from prior CT. No additional pulmonary nodules. Prior cavitary lesion in the posterior right lower lobe is no longer visualized, although possibly obscured. Underlying subpleural reticulation/fibrosis with interlobular septal thickening, right lung predominant. Overall, this appearance is nonspecific, but would be compatible with vasculitis with superimposed hydropneumothorax. Neoplasm is not entirely excluded, but is considered less likely given the imaging findings and prior biopsy results. Upper Abdomen: Mild thickening of the left adrenal gland. Fluid along the pancreatic body/ tail (series 2/ image 129), related to known acute pancreatitis, incompletely visualized. Musculoskeletal: Mild degenerative changes of the visualized thoracolumbar spine. IMPRESSION: Moderate right hydropneumothorax. Additional abnormal pleural thickening with scattered areas of pleural fluid in the right hemithorax. Additional subpleural reticulation/fibrosis with interlobular septal thickening, right greater than left. 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 See comments above. Fluid along the pancreatic body/ tail, related to known acute pancreatitis, incompletely visualized. Electronically Signed   By: Julian Hy M.D.   On: 10/03/2015 21:22   Dg Chest Port 1 View  10/04/2015  CLINICAL DATA:  Right-sided hydropneumothorax EXAM: PORTABLE CHEST 1 VIEW COMPARISON:  Chest radiograph and chest CT October 03, 2015 FINDINGS: There is fluid and airspace consolidation in the right base. Pneumothorax is not evident on current examination. A smaller amount of fluid and atelectasis/consolidation is noted in the left base. Heart is upper normal in size with pulmonary vascularity within normal limits. No adenopathy. Upper lung zones are clear  except for minimal right upper lobe atelectasis which is stable. IMPRESSION: Bibasilar airspace consolidation and effusions, more pronounced on the right than on the left. The pneumothorax noted 1 day prior in the right basilar region is not apparent on this current portable examination. Lungs elsewhere clear except for minimal atelectasis in the right upper lobe which is stable. Stable cardiac silhouette. Electronically Signed   By: Lowella Grip III M.D.   On: 10/04/2015 07:16   US Thoracentesis Asp Pleural Space W/img Guide  10/02/2015  INDICATION: Patient with recurrent right pleural effusion. Request is made for repeat diagnostic and therapeutic thoracentesis. EXAM: ULTRASOUND GUIDED DIAGNOSTIC AND THERAPEUTIC THORACENTESIS MEDICATIONS: 1% lidocaine with epinephrine. COMPLICATIONS: None immediate. PROCEDURE: An ultrasound guided thoracentesis was thoroughly discussed with the patient and questions answered. The benefits, risks, alternatives and complications were also discussed. The patient understands and wishes to proceed with the procedure. Written consent was obtained. Ultrasound was performed to localize and mark an adequate pocket of fluid in the right chest. The area was then prepped and draped in the normal sterile fashion. 1% Lidocaine with epi was used for local anesthesia. Under ultrasound guidance a Safe-T-Centesis catheter was introduced. Thoracentesis was performed. The catheter was removed and a dressing applied. FINDINGS: A total of approximately 0.93 L of yellow fluid was removed. Samples were sent to the  laboratory as requested by the clinical team. IMPRESSION: Successful ultrasound guided right thoracentesis yielding 0.93 L of pleural fluid. Read by: Saverio Danker, PA-C Electronically Signed   By: Lucrezia Europe M.D.   On: 10/02/2015 16:06    Labs:  CBC:  Recent Labs  09/28/15 1934 09/29/15 0431 10/02/15 0415 10/04/15 0350  WBC 11.2* 9.0 8.2 8.7  HGB 12.3 11.1* 10.9* 11.0*    HCT 34.6* 32.3* 31.7* 33.0*  PLT 194 174 185 227    COAGS:  Recent Labs  06/25/15 1610 07/17/15 0959 09/29/15 0015  INR 1.1* 1.14 1.38  APTT 30.0 33 37    BMP:  Recent Labs  09/30/15 0903 10/01/15 0424 10/02/15 0415 10/04/15 0350  NA 134* 136 137 137  K 3.6 4.3 4.6 3.5  CL 102 101 102 102  CO2 25 29 30 29   GLUCOSE 214* 130* 114* 111*  BUN 6 5* 7 8  CALCIUM 8.0* 8.0* 8.3* 8.2*  CREATININE 0.58 0.56 0.59 0.49  GFRNONAA >60 >60 >60 >60  GFRAA >60 >60 >60 >60    LIVER FUNCTION TESTS:  Recent Labs  09/25/15 0517 09/27/15 0532 09/28/15 1934 10/01/15 0424  BILITOT 1.0 0.7 0.4 0.8  AST 26 23 28 27   ALT 51 44 54 35  ALKPHOS 81 75 88 76  PROT 5.5* 5.0* 5.2* 4.5*  ALBUMIN 2.4* 2.2* 2.4* 2.0*    Assessment and Plan: 1. Hydropneumothorax, s/p thoracentesis -CXR today shows improvement of PTX.  CT from yesterday suggests hydropneumothorax.  No need for further intervention at this time as she is stable from a respiratory standpoint.  Further care at this time per pulmonary.  We will be available as needed.  Electronically Signed: Henreitta Cea 10/04/2015, 12:49 PM   I spent a total of 15 Minutes at the the patient's bedside AND on the patient's hospital floor or unit, greater than 50% of which was counseling/coordinating care for PTX, s/p thora

## 2015-10-04 NOTE — Progress Notes (Signed)
PROGRESS NOTE    [name] [name]  [id number] DOB: 1946-03-27 DOA: 09/28/2015 PCP: [name], MD   Brief Narrative: 70 y.o. female with medical history significant of COPD, asthma, tobacco abuse, hyperlipidemia, GERD, depression, anxiety, chronic diarrhea, CAD, granulomatosis with polyangiitis, recent admission due to acute pancreatitis with pseudocyst. Improved abdominal pain but found recurrent exudative pleural effusion  Assessment & Plan:   Principal Problem:   Acute on chronic respiratory failure with hypoxia ([place]) Active Problems:   Hereditary hemochromatosis ([place])   Essential hypertension   GERD   Asthma   CAD (coronary artery disease)   History of tobacco abuse   Hypokalemia   COPD (chronic obstructive pulmonary disease) with emphysema ([place])   Other mixed anxiety disorders   Wegener's granulomatosis with renal involvement ([place])   Pancreatitis   Pancreatic pseudocyst   Bilateral pleural effusion   UTI (lower urinary tract infection)   Pleural effusion, right   Cavitating mass in right lower lung lobe   Wegener's granulomatosis (granulomatosis with polyangiitis) ([place])   1. Cardiovascular. HTN. Will continue blood pressure control with metoprolol. Blood pressure has been stable at 126.  2. Pulmonary. Right pleural effusion. Exudate per Light's criteria. Sp thoracentcentesis x2. Follow chest CT personally reviewed noted hydropneumothorax, will continue supportive care with supplemental 0-2 per [place] and oxymetry monitor. Patient may need pleural catheter, will follow on pulmonary recommendations.  Wegner's g.  Rheumatology as outpatient.   3. Nephrology. Will follow renal panel in am. Will continue to hold on IV fluids for now.   4. GI. Pancreatic pseudocyst. Continue pain control with oxycodone and acetaminophen.  Po as tolerated. Will recommend out of bed tid with meals.  Patient continue at moderate risk for worsening pleural effusion.    DVT prophylaxis:  lovenox Code Status: full Family Communication: I spoke with patient's family at the bedside and all questions were addressed.  Disposition Plan:   Consultants:  Pulmonary  Procedures:  Thoracentesis 07/03- 07/05   Antimicrobials:  Subjective: Patient with persistent chest and abdominal pain, mild improve with po pain medications, no worsening factors, no radiation, no associated nausea or vomiting. No chills.    Objective: Filed Vitals:   10/03/15 2116 10/04/15 0645 10/04/15 0748 10/04/15 0834  BP: 124/76 120/65    Pulse: 115 100    Temp: 99.1 F (37.3 C) 98.1 F (36.7 C)    TempSrc: Oral Oral    Resp: 18 18    Height:      Weight:      SpO2: 95% 98% 96% 96%    Intake/Output Summary (Last 24 hours) at 10/04/15 1220 Last data filed at 10/04/15 0900  Gross per 24 hour  Intake   1880 ml  Output   1950 ml  Net    -70 ml   Filed Weights   09/28/15 2353  Weight: 69.5 kg (153 lb 3.5 oz)    Examination:  General exam: ill looking appearing and deconditioned. E ENT: mild conjunctival pallor, oral mucosa dry. Respiratory system: Right base with decreased breath sounds and dullness to percussion, no rhonchi but scattered rales bilaterally.  Cardiovascular system: S1 & S2 heard, RRR. No JVD, murmurs, rubs, gallops or clicks. No pedal edema. Gastrointestinal system: Abdomen is nondistended, soft and nontender. No organomegaly or masses felt. Normal bowel sounds heard. Central nervous system: Alert and oriented. No focal neurological deficits. Extremities: Symmetric 5 x 5 power. Skin: No rashes, lesions or ulcers  Data Reviewed: I have personally reviewed following labs and  imaging studies  CBC:  Recent Labs Lab 09/28/15 1934 09/29/15 0431 10/02/15 0415 10/04/15 0350  WBC 11.2* 9.0 8.2 8.7  NEUTROABS 9.1*  --  6.2 6.2  HGB 12.3 11.1* 10.9* 11.0*  HCT 34.6* 32.3* 31.7* 33.0*  MCV 93.5 95.3 96.1 95.9  PLT 194 174 185 [id number]   Basic Metabolic Panel:  Recent  Labs Lab 09/29/15 0431 09/30/15 0903 10/01/15 0424 10/02/15 0415 10/04/15 0350  NA 138 134* 136 137 137  K 3.1* 3.6 4.3 4.6 3.5  CL 104 102 101 102 102  CO2 28 25 29 30 29   GLUCOSE 123* 214* 130* 114* 111*  BUN 8 6 5* 7 8  CREATININE 0.69 0.58 0.56 0.59 0.49  CALCIUM 8.0* 8.0* 8.0* 8.3* 8.2*  MG 1.8 2.0  --   --   --    GFR: Estimated Creatinine Clearance: 62 mL/min (by C-G formula based on Cr of 0.49). Liver Function Tests:  Recent Labs Lab 09/28/15 1934 10/01/15 0424  AST 28 27  ALT 54 35  ALKPHOS 88 76  BILITOT 0.4 0.8  PROT 5.2* 4.5*  ALBUMIN 2.4* 2.0*    Recent Labs Lab 09/28/15 1943 10/01/15 0424  LIPASE 93* 66*   No results for input(s): AMMONIA in the last 168 hours. Coagulation Profile:  Recent Labs Lab 09/29/15 0015  INR 1.38   Cardiac Enzymes: No results for input(s): CKTOTAL, CKMB, CKMBINDEX, TROPONINI in the last 168 hours. BNP (last 3 results) No results for input(s): PROBNP in the last 8760 hours. HbA1C: No results for input(s): HGBA1C in the last 72 hours. CBG:  Recent Labs Lab 09/30/15 0812 10/01/15 0748 10/02/15 0749 10/03/15 0744 10/04/15 0741  GLUCAP 156* 134* 117* 171* 128*   Lipid Profile: No results for input(s): CHOL, HDL, LDLCALC, TRIG, CHOLHDL, LDLDIRECT in the last 72 hours. Thyroid Function Tests: No results for input(s): TSH, T4TOTAL, FREET4, T3FREE, THYROIDAB in the last 72 hours. Anemia Panel: No results for input(s): VITAMINB12, FOLATE, FERRITIN, TIBC, IRON, RETICCTPCT in the last 72 hours. Sepsis Labs:  Recent Labs Lab 09/28/15 2001  LATICACIDVEN 1.62    Recent Results (from the past 240 hour(s))  Urine culture     Status: Abnormal   Collection Time: 09/28/15  7:46 PM  Result Value Ref Range Status   Specimen Description URINE, RANDOM  Final   Special Requests NONE  Final   Culture MULTIPLE SPECIES PRESENT, SUGGEST RECOLLECTION (A)  Final   Report Status 09/30/2015 FINAL  Final  Culture, Urine      Status: Abnormal   Collection Time: 09/29/15 11:48 AM  Result Value Ref Range Status   Specimen Description URINE, CLEAN CATCH  Final   Special Requests Normal  Final   Culture MULTIPLE SPECIES PRESENT, SUGGEST RECOLLECTION (A)  Final   Report Status 09/30/2015 FINAL  Final  Culture, body fluid-bottle     Status: None   Collection Time: 09/29/15  4:48 PM  Result Value Ref Range Status   Specimen Description FLUID RIGHT PLEURAL  Final   Special Requests BOTTLES DRAWN AEROBIC AND ANAEROBIC 10CC  Final   Culture   Final    NO GROWTH 5 DAYS Performed at [facility]    Report Status 10/04/2015 FINAL  Final  Gram stain     Status: None   Collection Time: 09/29/15  4:48 PM  Result Value Ref Range Status   Specimen Description FLUID RIGHT PLEURAL  Final   Special Requests NONE  Final   Gram Stain  Final    MODERATE WBC PRESENT,BOTH PMN AND MONONUCLEAR NO ORGANISMS SEEN Performed at [facility]    Report Status 09/29/2015 FINAL  Final  Culture, body fluid-bottle     Status: None (Preliminary result)   Collection Time: 10/02/15  3:54 PM  Result Value Ref Range Status   Specimen Description FLUID PLEURAL  Final   Special Requests NONE  Final   Culture   Final    NO GROWTH 2 DAYS Performed at [facility]    Report Status PENDING  Incomplete  Gram stain     Status: None   Collection Time: 10/02/15  3:54 PM  Result Value Ref Range Status   Specimen Description FLUID PLEURAL  Final   Special Requests NONE  Final   Gram Stain   Final    MODERATE WBC PRESENT,BOTH PMN AND MONONUCLEAR NO ORGANISMS SEEN Performed at [facility]    Report Status 10/02/2015 FINAL  Final         Radiology Studies: Dg Chest 1 View  10/03/2015  CLINICAL DATA:  Follow-up thoracentesis ; previously identified right-sided basilar pneumothorax. EXAM: CHEST 1 VIEW COMPARISON:  Chest x-ray of October 02, 2015 FINDINGS: Again demonstrated is a small right basilar pneumothorax. It  appears stable in volume. No significant apical component is observed. There is no mediastinal shift. There is a small left pleural effusion with minimal left basilar atelectasis. The heart and pulmonary vascularity are normal. IMPRESSION: Stable small right sided basilar pneumothorax. Stable left lower lobe atelectasis and small effusion. Electronically Signed   By: David  Martinique M.D.   On: 10/03/2015 07:44   Dg Chest 1 View  10/02/2015  CLINICAL DATA:  RIGHT side pneumothorax postprocedural EXAM: CHEST 1 VIEW COMPARISON:  Portable exam 1845 hours compared to 1553 hours FINDINGS: Slight decrease in size of previously identified RIGHT basilar pneumothorax. Stable heart size and mediastinal contours. Bibasilar atelectasis and small LEFT pleural effusion. Bones unremarkable. IMPRESSION: Slight decrease in size of previously identified RIGHT basilar pneumothorax. Electronically Signed   By: Lavonia Dana M.D.   On: 10/02/2015 18:55   Dg Chest 1 View  10/02/2015  CLINICAL DATA:  Post right thoracentesis EXAM: CHEST 1 VIEW COMPARISON:  7/6/ 17 FINDINGS: Cardiomediastinal silhouette is stable. Mild right basilar atelectasis. Right pleural effusions has resolved. Trace left pleural effusion with left basilar atelectasis. There is a small loculated right basilar pneumothorax measure about 1.3 cm maximum thickness. IMPRESSION: Small loculated right basilar pneumothorax measures about 1.3 cm maximum thickness. Electronically Signed   By: Lahoma Crocker M.D.   On: 10/02/2015 16:05   Ct Chest Wo Contrast  10/03/2015  CLINICAL DATA:  Acute pancreatitis, acute on chronic respiratory failure with hypoxia, right lower lobe lung mass EXAM: CT CHEST WITHOUT CONTRAST TECHNIQUE: Multidetector CT imaging of the chest was performed following the standard protocol without IV contrast. COMPARISON:  Chest radiograph dated 10/03/2015. Partial comparison to CT abdomen pelvis dated 09/29/2015. PET-CT dated 07/07/2015. CT chest dated  06/26/2015. FINDINGS: Cardiovascular: The heart is normal in size. No pericardial effusion. Coronary atherosclerosis of the LAD. Atherosclerotic calcifications of the aortic arch. Mediastinum/Nodes: Small mediastinal lymph nodes which do not meet pathologic CT size criteria. No suspicious axillary lymphadenopathy. Visualized thyroid is unremarkable. Lungs/Pleura: Moderate right hydropneumothorax. Abnormal pleural thickening in the right hemithorax with additional scattered areas of loculated pleural fluid in the right hemithorax, including along the right major fissure. No convincing solid pleural component. Irregular 14 mm subpleural nodule in the left upper  lobe (series 5/images 18-19), new from prior CT. No additional pulmonary nodules. Prior cavitary lesion in the posterior right lower lobe is no longer visualized, although possibly obscured. Underlying subpleural reticulation/fibrosis with interlobular septal thickening, right lung predominant. Overall, this appearance is nonspecific, but would be compatible with vasculitis with superimposed hydropneumothorax. Neoplasm is not entirely excluded, but is considered less likely given the imaging findings and prior biopsy results. Upper Abdomen: Mild thickening of the left adrenal gland. Fluid along the pancreatic body/ tail (series 2/ image 129), related to known acute pancreatitis, incompletely visualized. Musculoskeletal: Mild degenerative changes of the visualized thoracolumbar spine. IMPRESSION: Moderate right hydropneumothorax. Additional abnormal pleural thickening with scattered areas of pleural fluid in the right hemithorax. Additional subpleural reticulation/fibrosis with interlobular septal thickening, right greater than left. 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 See comments above. Fluid along the pancreatic body/ tail, related to known acute pancreatitis, incompletely visualized. Electronically Signed   By: Julian Hy  M.D.   On: 10/03/2015 21:22   Dg Chest Port 1 View  10/04/2015  CLINICAL DATA:  Right-sided hydropneumothorax EXAM: PORTABLE CHEST 1 VIEW COMPARISON:  Chest radiograph and chest CT October 03, 2015 FINDINGS: There is fluid and airspace consolidation in the right base. Pneumothorax is not evident on current examination. A smaller amount of fluid and atelectasis/consolidation is noted in the left base. Heart is upper normal in size with pulmonary vascularity within normal limits. No adenopathy. Upper lung zones are clear except for minimal right upper lobe atelectasis which is stable. IMPRESSION: Bibasilar airspace consolidation and effusions, more pronounced on the right than on the left. The pneumothorax noted 1 day prior in the right basilar region is not apparent on this current portable examination. Lungs elsewhere clear except for minimal atelectasis in the right upper lobe which is stable. Stable cardiac silhouette. Electronically Signed   By: Lowella Grip III M.D.   On: 10/04/2015 07:16   US Thoracentesis Asp Pleural Space W/img Guide  10/02/2015  INDICATION: Patient with recurrent right pleural effusion. Request is made for repeat diagnostic and therapeutic thoracentesis. EXAM: ULTRASOUND GUIDED DIAGNOSTIC AND THERAPEUTIC THORACENTESIS MEDICATIONS: 1% lidocaine with epinephrine. COMPLICATIONS: None immediate. PROCEDURE: An ultrasound guided thoracentesis was thoroughly discussed with the patient and questions answered. The benefits, risks, alternatives and complications were also discussed. The patient understands and wishes to proceed with the procedure. Written consent was obtained. Ultrasound was performed to localize and mark an adequate pocket of fluid in the right chest. The area was then prepped and draped in the normal sterile fashion. 1% Lidocaine with epi was used for local anesthesia. Under ultrasound guidance a Safe-T-Centesis catheter was introduced. Thoracentesis was performed. The catheter  was removed and a dressing applied. FINDINGS: A total of approximately 0.93 L of yellow fluid was removed. Samples were sent to the laboratory as requested by the clinical team. IMPRESSION: Successful ultrasound guided right thoracentesis yielding 0.93 L of pleural fluid. Read by: Saverio Danker, PA-C Electronically Signed   By: Lucrezia Europe M.D.   On: 10/02/2015 16:06        Scheduled Meds: . diatrizoate meglumine-sodium  30 mL Oral Once  . ipratropium  0.5 mg Nebulization BID  . levalbuterol  1.25 mg Nebulization BID  . lidocaine  1 patch Transdermal Q24H  . loratadine  10 mg Oral Daily  . metoprolol succinate  25 mg Oral QHS  . metoprolol succinate  50 mg Oral Daily  . mometasone-formoterol  2 puff Inhalation BID  . nicotine  21 mg Transdermal Daily  . pantoprazole  40 mg Oral BID   Continuous Infusions: . sodium chloride 50 mL/hr at 10/01/15 2000     LOS: 5 days        Tawni Millers, MD [facility] Pager [phone]  If 7PM-7AM, please contact night-coverage [email or web address] Password [email or web address] 10/04/2015, 12:20 PM

## 2015-10-05 ENCOUNTER — Inpatient Hospital Stay (HOSPITAL_COMMUNITY): Payer: Medicare Other

## 2015-10-05 DIAGNOSIS — K858 Other acute pancreatitis without necrosis or infection: Secondary | ICD-10-CM

## 2015-10-05 LAB — BASIC METABOLIC PANEL
Anion gap: 6 (ref 5–15)
BUN: 9 mg/dL (ref 6–20)
CALCIUM: 8.4 mg/dL — AB (ref 8.9–10.3)
CO2: 28 mmol/L (ref 22–32)
CREATININE: 0.58 mg/dL (ref 0.44–1.00)
Chloride: 106 mmol/L (ref 101–111)
GFR calc non Af Amer: >60 mL/min (ref >60)
Glucose, Bld: 102 mg/dL — ABNORMAL HIGH (ref 65–99)
Potassium: 3.6 mmol/L (ref 3.5–5.1)
SODIUM: 140 mmol/L (ref 135–145)

## 2015-10-05 LAB — GLUCOSE, CAPILLARY: GLUCOSE-CAPILLARY: 98 mg/dL (ref 65–99)

## 2015-10-05 LAB — TROPONIN I: Troponin I: <0.03 ng/mL (ref <0.03)

## 2015-10-05 LAB — BLOOD GAS, ARTERIAL
ACID-BASE EXCESS: 3.5 mmol/L — AB (ref 0.0–2.0)
Bicarbonate: 26.7 mEq/L — ABNORMAL HIGH (ref 20.0–24.0)
Drawn by: 11249
O2 Content: 2 L/min
O2 SAT: 92.2 %
PATIENT TEMPERATURE: 98.8
PCO2 ART: 37 mmHg (ref 35.0–45.0)
PO2 ART: 74.5 mmHg — AB (ref 80.0–100.0)
TCO2: 24.2 mmol/L (ref 0–100)
pH, Arterial: 7.473 — ABNORMAL HIGH (ref 7.350–7.450)

## 2015-10-05 LAB — COMPREHENSIVE METABOLIC PANEL
ALBUMIN: 2.3 g/dL — AB (ref 3.5–5.0)
ALT: 24 U/L (ref 14–54)
AST: 18 U/L (ref 15–41)
Alkaline Phosphatase: 100 U/L (ref 38–126)
Anion gap: 8 (ref 5–15)
BILIRUBIN TOTAL: 0.6 mg/dL (ref 0.3–1.2)
BUN: 10 mg/dL (ref 6–20)
CHLORIDE: 100 mmol/L — AB (ref 101–111)
CO2: 29 mmol/L (ref 22–32)
CREATININE: 0.67 mg/dL (ref 0.44–1.00)
Calcium: 8.6 mg/dL — ABNORMAL LOW (ref 8.9–10.3)
GFR calc Af Amer: >60 mL/min (ref >60)
GLUCOSE: 125 mg/dL — AB (ref 65–99)
POTASSIUM: 3.8 mmol/L (ref 3.5–5.1)
Sodium: 137 mmol/L (ref 135–145)
Total Protein: 5.3 g/dL — ABNORMAL LOW (ref 6.5–8.1)

## 2015-10-05 LAB — BRAIN NATRIURETIC PEPTIDE: B Natriuretic Peptide: 52.4 pg/mL (ref 0.0–100.0)

## 2015-10-05 MED ORDER — POLYETHYLENE GLYCOL 3350 17 G PO PACK
17.0000 g | PACK | Freq: Every day | ORAL | Status: DC
  Administered 2015-10-05 – 2015-10-07 (×3): 17 g via ORAL
  Filled 2015-10-05 (×3): qty 1

## 2015-10-05 MED ORDER — TRAMADOL HCL 50 MG PO TABS
50.0000 mg | ORAL_TABLET | Freq: Three times a day (TID) | ORAL | Status: DC | PRN
  Administered 2015-10-05 – 2015-10-07 (×3): 50 mg via ORAL
  Filled 2015-10-05 (×5): qty 1

## 2015-10-05 MED ORDER — DOCUSATE SODIUM 100 MG PO CAPS
100.0000 mg | ORAL_CAPSULE | Freq: Two times a day (BID) | ORAL | Status: DC
  Administered 2015-10-05 – 2015-10-07 (×4): 100 mg via ORAL
  Filled 2015-10-05 (×4): qty 1

## 2015-10-05 MED ORDER — ENOXAPARIN SODIUM 40 MG/0.4ML ~~LOC~~ SOLN
40.0000 mg | SUBCUTANEOUS | Status: DC
  Administered 2015-10-05 – 2015-10-06 (×2): 40 mg via SUBCUTANEOUS
  Filled 2015-10-05 (×2): qty 0.4

## 2015-10-05 MED ORDER — IPRATROPIUM BROMIDE 0.02 % IN SOLN
0.5000 mg | Freq: Three times a day (TID) | RESPIRATORY_TRACT | Status: DC
  Administered 2015-10-06 – 2015-10-07 (×4): 0.5 mg via RESPIRATORY_TRACT
  Filled 2015-10-05 (×5): qty 2.5

## 2015-10-05 MED ORDER — LEVALBUTEROL HCL 1.25 MG/0.5ML IN NEBU
1.2500 mg | INHALATION_SOLUTION | Freq: Three times a day (TID) | RESPIRATORY_TRACT | Status: DC
  Administered 2015-10-06 – 2015-10-07 (×4): 1.25 mg via RESPIRATORY_TRACT
  Filled 2015-10-05 (×6): qty 0.5

## 2015-10-05 NOTE — Progress Notes (Signed)
OT Cancellation Note  Patient Details Name: [name] [name] MRN: [id number] DOB: 06-Dec-1945   Cancelled Treatment:    Reason Eval/Treat Not Completed: Fatigue/lethargy limiting ability to participate. Pt just used bathroom and washed up. Will check back tomorrow.  Mykael Batz 10/05/2015, 9:51 AM  Lesle Chris, OTR/L [phone] 10/05/2015

## 2015-10-05 NOTE — Progress Notes (Signed)
PROGRESS NOTE    [name] [name]  [id number] DOB: 03-25-1946 DOA: 09/28/2015 PCP: [name], MD   Brief Narrative: 70 y.o. female with medical history significant of COPD, asthma, tobacco abuse, hyperlipidemia, GERD, depression, anxiety, chronic diarrhea, CAD, granulomatosis with polyangiitis, recent admission due to acute pancreatitis with pseudocyst. Improved abdominal pain but found recurrent exudative pleural effusion, complicated with pneumothorax post thoracentesis.   Assessment & Plan:   Principal Problem:   Acute on chronic respiratory failure with hypoxia (HCC) Active Problems:   Hereditary hemochromatosis (HCC)   Essential hypertension   GERD   Asthma   CAD (coronary artery disease)   History of tobacco abuse   Hypokalemia   COPD (chronic obstructive pulmonary disease) with emphysema (HCC)   Other mixed anxiety disorders   Wegener's granulomatosis with renal involvement (HCC)   Pancreatitis   Pancreatic pseudocyst   Bilateral pleural effusion   UTI (lower urinary tract infection)   Pleural effusion, right   Cavitating mass in right lower lung lobe   Wegener's granulomatosis (granulomatosis with polyangiitis) ([facility])   1. Cardiovascular. HTN. Will continue blood pressure control with metoprolol. Blood pressure has been stable at [id number] and [id number] systolic.  2. Pulmonary. Right pleural effusion. Exudate per Light's criteria. Sp thoracentcentesis x2.Follow chest film with positive hydropneumothorax. Case discussed with Pulmonary over the phone, will wait rheumatology recommendations, if likely Wegner's will continue immunosupresive therapy if not will need biopsy.   3. Nephrology. Renal function stable with cr at 0.58 with K at 3,6, will follow renal panel in am.  4. GI. Pancreatic pseudocyst. Persistent pain patient not tolerating oxycodone, will change to tramadol, continue acetaminophen.  Po as tolerated. Will recommend out of bed tid with meals.  Patient  continue at moderate risk for worsening pleural effusion.    DVT prophylaxis: lovenox Code Status: full Family Communication:  Disposition Plan:   Consultants:   Pulmonary  Procedures:  Thoracentesis 07/03- 07/05   Antimicrobials:  Subjective: Patient with persistent pain on the right hemithorax and epigastrium, moderate to severe, mild improvement with pain medications, no radiation, no worsening factors, associated with poor appetite and generalized weakness.   Objective: Filed Vitals:   10/04/15 2123 10/05/15 0637 10/05/15 0748 10/05/15 0947  BP: 114/83 112/60  106/74  Pulse: 95 101  117  Temp: 98.7 F (37.1 C) 98.4 F (36.9 C)    TempSrc: Oral Oral    Resp: 20 24    Height:      Weight:      SpO2: 94% 91% 91%     Intake/Output Summary (Last 24 hours) at 10/05/15 1235 Last data filed at 10/05/15 0935  Gross per 24 hour  Intake   2040 ml  Output   2150 ml  Net   -110 ml   Filed Weights   09/28/15 2353  Weight: 69.5 kg (153 lb 3.5 oz)    Examination:  General exam: Patient deconditioned and ill looking appearing. E ENT: Mild conjunctival pallor, oral mucosa moist. Respiratory system: Decreased breath sounds at the right base, with bibasilar rales but no wheezing, poor inspiratory effort.  Cardiovascular system: S1 & S2 heard, RRR. No JVD, murmurs, rubs, gallops or clicks. No pedal edema. Gastrointestinal system: Abdomen is mild distended, soft but tender to deep palpation at the epigastrium. No organomegaly or masses felt. Normal bowel sounds heard. Central nervous system: Alert and oriented. No focal neurological deficits. Extremities: Symmetric 5 x 5 power. Skin: No rashes, lesions or ulcers    Data  Reviewed: I have personally reviewed following labs and imaging studies  CBC:  Recent Labs Lab 09/28/15 1934 09/29/15 0431 10/02/15 0415 10/04/15 0350  WBC 11.2* 9.0 8.2 8.7  NEUTROABS 9.1*  --  6.2 6.2  HGB 12.3 11.1* 10.9* 11.0*  HCT 34.6*  32.3* 31.7* 33.0*  MCV 93.5 95.3 96.1 95.9  PLT 194 174 185 [id number]   Basic Metabolic Panel:  Recent Labs Lab 09/29/15 0431 09/30/15 0903 10/01/15 0424 10/02/15 0415 10/04/15 0350 10/05/15 0440  NA 138 134* 136 137 137 140  K 3.1* 3.6 4.3 4.6 3.5 3.6  CL 104 102 101 102 102 106  CO2 28 25 29 30 29 28   GLUCOSE 123* 214* 130* 114* 111* 102*  BUN 8 6 5* 7 8 9   CREATININE 0.69 0.58 0.56 0.59 0.49 0.58  CALCIUM 8.0* 8.0* 8.0* 8.3* 8.2* 8.4*  MG 1.8 2.0  --   --   --   --    GFR: Estimated Creatinine Clearance: 62 mL/min (by C-G formula based on Cr of 0.58). Liver Function Tests:  Recent Labs Lab 09/28/15 1934 10/01/15 0424  AST 28 27  ALT 54 35  ALKPHOS 88 76  BILITOT 0.4 0.8  PROT 5.2* 4.5*  ALBUMIN 2.4* 2.0*    Recent Labs Lab 09/28/15 1943 10/01/15 0424  LIPASE 93* 66*   No results for input(s): AMMONIA in the last 168 hours. Coagulation Profile:  Recent Labs Lab 09/29/15 0015  INR 1.38   Cardiac Enzymes: No results for input(s): CKTOTAL, CKMB, CKMBINDEX, TROPONINI in the last 168 hours. BNP (last 3 results) No results for input(s): PROBNP in the last 8760 hours. HbA1C: No results for input(s): HGBA1C in the last 72 hours. CBG:  Recent Labs Lab 10/01/15 0748 10/02/15 0749 10/03/15 0744 10/04/15 0741 10/05/15 0758  GLUCAP 134* 117* 171* 128* 98   Lipid Profile: No results for input(s): CHOL, HDL, LDLCALC, TRIG, CHOLHDL, LDLDIRECT in the last 72 hours. Thyroid Function Tests: No results for input(s): TSH, T4TOTAL, FREET4, T3FREE, THYROIDAB in the last 72 hours. Anemia Panel: No results for input(s): VITAMINB12, FOLATE, FERRITIN, TIBC, IRON, RETICCTPCT in the last 72 hours. Sepsis Labs:  Recent Labs Lab 09/28/15 2001  LATICACIDVEN 1.62    Recent Results (from the past 240 hour(s))  Urine culture     Status: Abnormal   Collection Time: 09/28/15  7:46 PM  Result Value Ref Range Status   Specimen Description URINE, RANDOM  Final   Special  Requests NONE  Final   Culture MULTIPLE SPECIES PRESENT, SUGGEST RECOLLECTION (A)  Final   Report Status 09/30/2015 FINAL  Final  Culture, Urine     Status: Abnormal   Collection Time: 09/29/15 11:48 AM  Result Value Ref Range Status   Specimen Description URINE, CLEAN CATCH  Final   Special Requests Normal  Final   Culture MULTIPLE SPECIES PRESENT, SUGGEST RECOLLECTION (A)  Final   Report Status 09/30/2015 FINAL  Final  Culture, body fluid-bottle     Status: None   Collection Time: 09/29/15  4:48 PM  Result Value Ref Range Status   Specimen Description FLUID RIGHT PLEURAL  Final   Special Requests BOTTLES DRAWN AEROBIC AND ANAEROBIC 10CC  Final   Culture   Final    NO GROWTH 5 DAYS Performed at [facility]    Report Status 10/04/2015 FINAL  Final  Gram stain     Status: None   Collection Time: 09/29/15  4:48 PM  Result Value Ref Range Status  Specimen Description FLUID RIGHT PLEURAL  Final   Special Requests NONE  Final   Gram Stain   Final    MODERATE WBC PRESENT,BOTH PMN AND MONONUCLEAR NO ORGANISMS SEEN Performed at [facility]    Report Status 09/29/2015 FINAL  Final  Culture, body fluid-bottle     Status: None (Preliminary result)   Collection Time: 10/02/15  3:54 PM  Result Value Ref Range Status   Specimen Description FLUID PLEURAL  Final   Special Requests NONE  Final   Culture   Final    NO GROWTH 2 DAYS Performed at [facility]    Report Status PENDING  Incomplete  Gram stain     Status: None   Collection Time: 10/02/15  3:54 PM  Result Value Ref Range Status   Specimen Description FLUID PLEURAL  Final   Special Requests NONE  Final   Gram Stain   Final    MODERATE WBC PRESENT,BOTH PMN AND MONONUCLEAR NO ORGANISMS SEEN Performed at [facility]    Report Status 10/02/2015 FINAL  Final         Radiology Studies: Dg Chest 2 View  10/05/2015  CLINICAL DATA:  70 year old female with shortness of Breath. Loculated  posterior right hydro-pneumothorax following recent thoracentesis for progressive right pleural effusion. Initial encounter. EXAM: CHEST  2 VIEW COMPARISON:  Chest CT 10/03/2015 and earlier. FINDINGS: Moderate-sized posterior loculated right hydro pneumothorax probably is not significantly changed from the recent CT. Additional abnormal pleural thickening and nodularity was noted on that study. There is no apical pneumothorax. Right lung ventilation is stable. Small layering left pleural effusion is stable. Stable cardiac size and mediastinal contours. No pulmonary edema. No acute osseous abnormality identified. Stable cholecystectomy clips. Calcified aortic atherosclerosis. IMPRESSION: 1. Stable since the chest CT 10/03/2015. Moderate size loculated posterior right hydro-pneumothorax with additional pleural and parenchymal abnormalities as described on that study. 2. Small layering left pleural effusion. 3. No new cardiopulmonary abnormality. Calcified aortic atherosclerosis. Electronically Signed   By: Genevie Ann M.D.   On: 10/05/2015 09:19   Ct Chest Wo Contrast  10/03/2015  CLINICAL DATA:  Acute pancreatitis, acute on chronic respiratory failure with hypoxia, right lower lobe lung mass EXAM: CT CHEST WITHOUT CONTRAST TECHNIQUE: Multidetector CT imaging of the chest was performed following the standard protocol without IV contrast. COMPARISON:  Chest radiograph dated 10/03/2015. Partial comparison to CT abdomen pelvis dated 09/29/2015. PET-CT dated 07/07/2015. CT chest dated 06/26/2015. FINDINGS: Cardiovascular: The heart is normal in size. No pericardial effusion. Coronary atherosclerosis of the LAD. Atherosclerotic calcifications of the aortic arch. Mediastinum/Nodes: Small mediastinal lymph nodes which do not meet pathologic CT size criteria. No suspicious axillary lymphadenopathy. Visualized thyroid is unremarkable. Lungs/Pleura: Moderate right hydropneumothorax. Abnormal pleural thickening in the right  hemithorax with additional scattered areas of loculated pleural fluid in the right hemithorax, including along the right major fissure. No convincing solid pleural component. Irregular 14 mm subpleural nodule in the left upper lobe (series 5/images 18-19), new from prior CT. No additional pulmonary nodules. Prior cavitary lesion in the posterior right lower lobe is no longer visualized, although possibly obscured. Underlying subpleural reticulation/fibrosis with interlobular septal thickening, right lung predominant. Overall, this appearance is nonspecific, but would be compatible with vasculitis with superimposed hydropneumothorax. Neoplasm is not entirely excluded, but is considered less likely given the imaging findings and prior biopsy results. Upper Abdomen: Mild thickening of the left adrenal gland. Fluid along the pancreatic body/ tail (series 2/ image 129), related  to known acute pancreatitis, incompletely visualized. Musculoskeletal: Mild degenerative changes of the visualized thoracolumbar spine. IMPRESSION: Moderate right hydropneumothorax. Additional abnormal pleural thickening with scattered areas of pleural fluid in the right hemithorax. Additional subpleural reticulation/fibrosis with interlobular septal thickening, right greater than left. 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 See comments above. Fluid along the pancreatic body/ tail, related to known acute pancreatitis, incompletely visualized. Electronically Signed   By: Julian Hy M.D.   On: 10/03/2015 21:22   Dg Chest Port 1 View  10/04/2015  CLINICAL DATA:  Right-sided hydropneumothorax EXAM: PORTABLE CHEST 1 VIEW COMPARISON:  Chest radiograph and chest CT October 03, 2015 FINDINGS: There is fluid and airspace consolidation in the right base. Pneumothorax is not evident on current examination. A smaller amount of fluid and atelectasis/consolidation is noted in the left base. Heart is upper normal in size with  pulmonary vascularity within normal limits. No adenopathy. Upper lung zones are clear except for minimal right upper lobe atelectasis which is stable. IMPRESSION: Bibasilar airspace consolidation and effusions, more pronounced on the right than on the left. The pneumothorax noted 1 day prior in the right basilar region is not apparent on this current portable examination. Lungs elsewhere clear except for minimal atelectasis in the right upper lobe which is stable. Stable cardiac silhouette. Electronically Signed   By: Lowella Grip III M.D.   On: 10/04/2015 07:16        Scheduled Meds: . diatrizoate meglumine-sodium  30 mL Oral Once  . ipratropium  0.5 mg Nebulization BID  . levalbuterol  1.25 mg Nebulization BID  . lidocaine  1 patch Transdermal Q24H  . loratadine  10 mg Oral Daily  . metoprolol succinate  25 mg Oral QHS  . metoprolol succinate  50 mg Oral Daily  . mometasone-formoterol  2 puff Inhalation BID  . nicotine  21 mg Transdermal Daily  . pantoprazole  40 mg Oral BID   Continuous Infusions: . sodium chloride Stopped (10/05/15 0844)     LOS: 6 days       Nissi Doffing Gerome Apley, MD [facility] Pager [phone]  If 7PM-7AM, please contact night-coverage [email or web address] Password [facility] 10/05/2015, 12:35 PM

## 2015-10-05 NOTE — Progress Notes (Signed)
Pt felt SOB and tachycardic shortly after receiving inhaler and nebulizer treatment. This RN assessed pt, SPO2 94 on 1L [place], all VSS. Right lower lung sounded more coarse, pt family members stated "her hands and feet look more swollen than they did yesterday." On-call paged and asked for someone to come assess the patient. New orders placed for another chest x-ray. Will continue to monitor closely.

## 2015-10-06 ENCOUNTER — Inpatient Hospital Stay (HOSPITAL_COMMUNITY): Payer: Medicare Other

## 2015-10-06 DIAGNOSIS — J9621 Acute and chronic respiratory failure with hypoxia: Secondary | ICD-10-CM

## 2015-10-06 LAB — MPO/PR-3 (ANCA) ANTIBODIES

## 2015-10-06 LAB — GLUCOSE, CAPILLARY: Glucose-Capillary: 157 mg/dL — ABNORMAL HIGH (ref 65–99)

## 2015-10-06 LAB — PH, BODY FLUID: pH, Body Fluid: 7.8

## 2015-10-06 LAB — GLOMERULAR BASEMENT MEMBRANE ANTIBODIES: GBM AB: 3 U (ref 0–20)

## 2015-10-06 MED ORDER — BOOST / RESOURCE BREEZE PO LIQD
1.0000 | Freq: Three times a day (TID) | ORAL | Status: DC
  Administered 2015-10-06 – 2015-10-07 (×3): 1 via ORAL

## 2015-10-06 NOTE — NC FL2 (Signed)
[place] LEVEL OF CARE SCREENING TOOL     IDENTIFICATION  Patient Name: [name] [name] Birthdate: 1945-06-14 Sex: female Admission Date (Current Location): 09/28/2015  [facility] and [place] Number:  Herbalist and Address:  [facility],  [place], [place]      Provider Number: [id number]  Attending Physician Name and Address:  Tawni Millers, *  Relative Name and Phone Number:       Current Level of Care: Hospital Recommended Level of Care: [place] Prior Approval Number:    Date Approved/Denied:   PASRR Number:    Discharge Plan: Home    Current Diagnoses: Patient Active Problem List   Diagnosis Date Noted  . Pleural effusion, right 10/03/2015  . Cavitating mass in right lower lung lobe 10/03/2015  . Wegener's granulomatosis (granulomatosis with polyangiitis) ([place]) 10/03/2015  . Acute on chronic respiratory failure with hypoxia ([place]) 09/28/2015  . Bilateral pleural effusion 09/28/2015  . UTI (lower urinary tract infection) 09/28/2015  . Acute pancreatitis   . Pancreatitis 09/23/2015  . Pancreatic pseudocyst 09/23/2015  . Back pain 08/08/2015  . Wegener's granulomatosis with renal involvement ([place]) 07/30/2015  . Lung mass 06/25/2015  . Tobacco user 06/25/2015  . Other mixed anxiety disorders 06/16/2015  . UTI (urinary tract infection) 04/28/2015  . Osteopenia 10/07/2014  . Esophageal dysphagia 09/24/2014  . Hx of adenomatous polyp of colon 07/25/2014  . 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 Benign head tremor 05/07/2014  . 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 COPD (chronic obstructive pulmonary disease) with emphysema ([place])   . Hypokalemia   . Hyperglycemia   . Depression 01/29/2012  . Dyslipidemia 08/04/2011  . CAD (coronary artery disease) 08/04/2011  . History of tobacco abuse 08/04/2011  . Grief reaction   . Asthma 05/21/2010   . Allergic rhinitis 05/17/2010  . Hereditary hemochromatosis ([place]) 05/15/2010  . Essential hypertension 05/15/2010  . GERD 05/15/2010    Orientation RESPIRATION BLADDER Height & Weight     Self, Time, Situation, Place  O2 (1L) Continent Weight: 153 lb 3.5 oz (69.5 kg) Height:  5\' 3"  (160 cm)  BEHAVIORAL SYMPTOMS/MOOD NEUROLOGICAL BOWEL NUTRITION STATUS      Continent Diet (Regular)  AMBULATORY STATUS COMMUNICATION OF NEEDS Skin   Limited Assist Verbally Normal                       Personal Care Assistance Level of Assistance  Bathing, Dressing Bathing Assistance: Limited assistance   Dressing Assistance: Limited assistance     Functional Limitations Info             SPECIAL CARE FACTORS FREQUENCY  PT (By licensed PT), OT (By licensed OT)     PT Frequency: 5 OT Frequency: 5            Contractures      Additional Factors Info  Code Status, Allergies Code Status Info: Fullcode Allergies Info: Allergies:  Other, Ciprofloxacin, Codeine, Macrobid, Morphine And Related, Prevnar           Current Medications (10/06/2015):  This is the current hospital active medication list Current Facility-Administered Medications  Medication Dose Route Frequency Provider Last Rate Last Dose  . 0.9 %  sodium chloride infusion   Intravenous Continuous Barton Dubois, MD 50 mL/hr at 10/06/15 [phone]    . acetaminophen (TYLENOL) tablet 650 mg  650 mg Oral Q6H PRN Ivor Costa, MD  650 mg at 10/06/15 1605  . dextromethorphan-guaiFENesin (MUCINEX DM) 30-600 MG per 12 hr tablet 1 tablet  1 tablet Oral BID PRN Ivor Costa, MD      . diatrizoate meglumine-sodium (GASTROGRAFIN) 66-10 % solution 30 mL  30 mL Oral Once Barton Dubois, MD      . diazepam (VALIUM) tablet 2.5-5 mg  2.5-5 mg Oral Q12H PRN Ivor Costa, MD   5 mg at 10/06/15 0140  . docusate sodium (COLACE) capsule 100 mg  100 mg Oral BID Mauricio Gerome Apley, MD   100 mg at 10/06/15 0758  . enoxaparin (LOVENOX) injection 40 mg   40 mg Subcutaneous Q24H Tawni Millers, MD   40 mg at 10/05/15 1955  . feeding supplement (BOOST / RESOURCE BREEZE) liquid 1 Container  1 Container Oral TID BM Mauricio Gerome Apley, MD   1 Container at 10/06/15 1500  . ipratropium (ATROVENT) nebulizer solution 0.5 mg  0.5 mg Nebulization TID Tawni Millers, MD   0.5 mg at 10/06/15 0804  . levalbuterol (XOPENEX) nebulizer solution 1.25 mg  1.25 mg Nebulization TID Tawni Millers, MD   1.25 mg at 10/06/15 0804  . lidocaine (LIDODERM) 5 % 1 patch  1 patch Transdermal Q24H Tawni Millers, MD   1 patch at 10/01/15 1404  . loperamide (IMODIUM) capsule 2 mg  2 mg Oral PRN Ivor Costa, MD      . loratadine (CLARITIN) tablet 10 mg  10 mg Oral Daily Ivor Costa, MD   10 mg at 10/01/15 0957  . metoprolol succinate (TOPROL-XL) 24 hr tablet 25 mg  25 mg Oral QHS Ivor Costa, MD   25 mg at 10/05/15 2205  . metoprolol succinate (TOPROL-XL) 24 hr tablet 50 mg  50 mg Oral Daily Ivor Costa, MD   50 mg at 10/06/15 0758  . mometasone-formoterol (DULERA) 200-5 MCG/ACT inhaler 2 puff  2 puff Inhalation BID Ivor Costa, MD   2 puff at 10/06/15 0804  . nicotine (NICODERM CQ - dosed in mg/24 hours) patch 21 mg  21 mg Transdermal Daily Ivor Costa, MD   21 mg at 09/29/15 0047  . ondansetron (ZOFRAN) injection 4 mg  4 mg Intravenous Q8H PRN Ivor Costa, MD   4 mg at 10/06/15 1159  . pantoprazole (PROTONIX) EC tablet 40 mg  40 mg Oral BID Barton Dubois, MD   40 mg at 10/06/15 0757  . polyethylene glycol (MIRALAX / GLYCOLAX) packet 17 g  17 g Oral Daily Tawni Millers, MD   17 g at 10/06/15 0758  . simethicone (MYLICON) chewable tablet 120 mg  120 mg Oral Q6H PRN Ivor Costa, MD   120 mg at 10/06/15 1605  . traMADol (ULTRAM) tablet 50 mg  50 mg Oral Q8H PRN Tawni Millers, MD   50 mg at 10/05/15 1426     Discharge Medications: Please see discharge summary for a list of discharge medications.  Relevant Imaging Results:  Relevant Lab  Results:   Additional Information SSN: [id number]  Standley Brooking, LCSW

## 2015-10-06 NOTE — Progress Notes (Signed)
Physical Therapy Treatment Patient Details Name: [name] [name] MRN: [id number] DOB: [birth year] Today's Date: 10/06/2015    History of Present Illness 70 y.o. female with medical history significant of COPD, asthma, tobacco abuse, hyperlipidemia, GERD, depression, anxiety, chronic diarrhea, CAD, granulomatosis with polyangiitis, recent admission due to acute pancreatitis with pseudocyst and admitted 09/28/15 for acute on chronic respiratory failure with hypoxia    PT Comments    Pt c/o not feeling well on today-feels weak and shaky. O2 sats dropped to 87% on RA during ambulation. Decreased ambulation distance on today due to weakness and decreased activity tolerance. Assisted pt back to bed at her request. O2 sats improved to 92% on 1L O2. Will continue to follow and progress activity as able. Discussed d/c plan-pt states she still plans to return home and that she has some help available.   Follow Up Recommendations  Home health PT;Supervision - Intermittent     Equipment Recommendations  Rolling walker with 5" wheels    Recommendations for Other Services       Precautions / Restrictions Precautions Precautions: Fall Precaution Comments: monitor sats Restrictions Weight Bearing Restrictions: No    Mobility  Bed Mobility Overal bed mobility: Modified Independent             General bed mobility comments: hob elevated. some reliance on bedrails  Transfers Overall transfer level: Needs assistance Equipment used: Rolling walker (2 wheeled) Transfers: Sit to/from Stand Sit to Stand: Supervision         General transfer comment: for safety  Ambulation/Gait Ambulation/Gait assistance: Min guard Ambulation Distance (Feet): 55 Feet Assistive device: Rolling walker (2 wheeled)       General Gait Details: close guard for safety.O2 sats 87% on RA during activity. Dyspnea 2/4. Pt c/o feeling weak and shaky on today.   Stairs            Wheelchair Mobility     Modified Rankin (Stroke Patients Only)       Balance                                    Cognition Arousal/Alertness: Awake/alert Behavior During Therapy: WFL for tasks assessed/performed Overall Cognitive Status: Within Functional Limits for tasks assessed                      Exercises      General Comments        Pertinent Vitals/Pain Pain Assessment: Faces Faces Pain Scale: Hurts a little bit Pain Location: back Pain Descriptors / Indicators: Sore Pain Intervention(s): Limited activity within patient's tolerance;Repositioned    Home Living                      Prior Function            PT Goals (current goals can now be found in the care plan section) Progress towards PT goals: Progressing toward goals (slowly)    Frequency  Min 3X/week    PT Plan Current plan remains appropriate    Co-evaluation             End of Session   Activity Tolerance: Patient limited by fatigue Patient left: in bed;with call bell/phone within reach     Time: [id number] PT Time Calculation (min) (ACUTE ONLY): 13 min  Charges:  $Gait Training: 8-22 mins  G Codes:      Weston Anna, MPT Pager: [phone]

## 2015-10-06 NOTE — Progress Notes (Signed)
Initial Nutrition Assessment  INTERVENTION:   -Provide Boost Breeze po TID, each supplement provides 250 kcal and 9 grams of protein -Encourage PO intake -Provided strategies to help with taste changes -RD to continue to monitor  NUTRITION DIAGNOSIS:   Increased nutrient needs related to other (see comment) (COPD, pancreatitis) as evidenced by estimated needs.  GOAL:   Patient will meet greater than or equal to 90% of their needs  MONITOR:   PO intake, Supplement acceptance, Labs, Weight trends, I & O's  REASON FOR ASSESSMENT:   Consult Poor PO  ASSESSMENT:   70 y.o. female with medical history significant of COPD, asthma, tobacco abuse, hyperlipidemia, GERD, depression, anxiety, chronic diarrhea, CAD, granulomatosis with polyangiitis, recent admission due to acute pancreatitis with pseudocyst. Improved abdominal pain but found recurrent exudative pleural effusion, complicated with pneumothorax post thoracentesis.  Patient in room resting with daughter at bedside. Pt c/o no taste for food, stating that everything taste bland. Pt with history of tobacco use which can alter taste and smell. Reviewed taste change strategies with patient. Pt does not like that she is on a heart healthy diet, stating "I can't eat this mess". Pt's documented PO intake has ranged from 75-100% in the last 2 days. Pt states "If I'm only eating a few bites, why can't I have a regular diet?". Pt states that she does not drink Ensure supplements as they cause diarrhea for her. Pt is willing to try Boost Breeze supplements, nurse tech provided one for patient to try during RD visit.   Pt's weight has fluctuated since February. Currently weight is up since 6/27.  Unable to perform NFPE d/t patient reporting pain.   Medications: Zofran IV PRN Labs reviewed: CBGs: 157  Diet Order:  Diet Heart Room service appropriate?: Yes; Fluid consistency:: Thin  Skin:  Reviewed, no issues  Last BM:  7/10  Height:    Ht Readings from Last 1 Encounters:  09/28/15 5\' 3"  (1.6 m)    Weight:   Wt Readings from Last 1 Encounters:  09/28/15 153 lb 3.5 oz (69.5 kg)    Ideal Body Weight:  52.3 kg  BMI:  Body mass index is 27.15 kg/(m^2).  Estimated Nutritional Needs:   Kcal:  1700-1900  Protein:  95-105g  Fluid:  1.7L/day  EDUCATION NEEDS:   Education needs addressed  Clayton Bibles, MS, RD, LDN Pager: [phone] After Hours Pager: [phone]

## 2015-10-06 NOTE — Progress Notes (Signed)
PROGRESS NOTE    [name] [name]  [id number] DOB: May 10, 1945 DOA: 09/28/2015 PCP: [name], MD   Brief Narrative: 70 y.o. female with medical history significant of COPD, asthma, tobacco abuse, hyperlipidemia, GERD, depression, anxiety, chronic diarrhea, CAD, granulomatosis with polyangiitis, recent admission due to acute pancreatitis with pseudocyst. Improved abdominal pain but found recurrent exudative pleural effusion, complicated with pneumothorax post thoracentesis.  Assessment & Plan:   Principal Problem:   Acute on chronic respiratory failure with hypoxia (HCC) Active Problems:   Hereditary hemochromatosis (HCC)   Essential hypertension   GERD   Asthma   CAD (coronary artery disease)   History of tobacco abuse   Hypokalemia   COPD (chronic obstructive pulmonary disease) with emphysema (HCC)   Other mixed anxiety disorders   Wegener's granulomatosis with renal involvement (HCC)   Pancreatitis   Pancreatic pseudocyst   Bilateral pleural effusion   UTI (lower urinary tract infection)   Pleural effusion, right   Cavitating mass in right lower lung lobe   Wegener's granulomatosis (granulomatosis with polyangiitis) ([place])   1. Cardiovascular. HTN. Will continue blood pressure control with metoprolol. Blood pressure has been stable at [id number] ystolic.  2. Pulmonary. Right pleural effusion. Exudate per Light's criteria. Follow chest film personally reviewed noted persistent right hydropneumothorax, will continue supportive care, patient will need home 02. Will follow pulmonary recommendations and follow up as outpatient with CT.   3. Nephrology. Renal function stable with cr at 0.67 with K at 3,8, will follow renal panel in am. Patient with very poor oral intake, will continue saline for now.   4. GI. Pancreatic pseudocyst. Will advance diet to regular with no restrictions, continue pain control and supportive care, poor nutrition.  5, Neurology. Deconditioning  Will  recommend out of bed tid with meals. Will continue physical therapy evaluation, case manager consulted for dc planing, patient may need snf.   Patient continue at moderate risk for worsening pleural effusion.    DVT prophylaxis: lovenox Code Status: full Family Communication: I spoke with patient family at the bedside and all questions were addressed, key information for patient's care was obtained.  Disposition Plan:  Home with home health and home pt in am.   Consultants:     Procedures:  Antimicrobials:  Subjective: Patient with improved abdominal and chest pain. No nausea or vomiting. Has been tolerating po. Positive fatigue and deconditioning. Had oxygen desaturation on ambulation on room air.   Objective: Filed Vitals:   10/05/15 2203 10/06/15 0631 10/06/15 0805 10/06/15 1356  BP: 115/71 113/58  123/80  Pulse: 103 97  96  Temp: 98.8 F (37.1 C) 98 F (36.7 C)  98 F (36.7 C)  TempSrc: Oral Oral  Oral  Resp: 20   18  Height:      Weight:      SpO2: 94% 95% 92% 97%    Intake/Output Summary (Last 24 hours) at 10/06/15 1436 Last data filed at 10/06/15 0830  Gross per 24 hour  Intake 1185.83 ml  Output    600 ml  Net 585.83 ml   Filed Weights   09/28/15 2353  Weight: 69.5 kg (153 lb 3.5 oz)    Examination:  General exam: deconditioned and ill looking appearing E ENT: Mild conjunctival pallor, oral mucosa moist Respiratory system: Decreased air movement and bibasilar rales, no wheezing, rales or rhonchi.  Cardiovascular system: S1 & S2 heard, RRR. No JVD, murmurs, rubs, gallops or clicks. No pedal edema. Gastrointestinal system: Abdomen is mild distended  and soft. Mild tender to palpation. No organomegaly or masses felt. Normal bowel sounds heard. Central nervous system: Alert and oriented. No focal neurological deficits. Extremities: Symmetric 5 x 5 power. Skin: No rashes, lesions or ulcers Psychiatry: Judgement and insight appear normal. Mood & affect  appropriate.     Data Reviewed: I have personally reviewed following labs and imaging studies  CBC:  Recent Labs Lab 10/02/15 0415 10/04/15 0350  WBC 8.2 8.7  NEUTROABS 6.2 6.2  HGB 10.9* 11.0*  HCT 31.7* 33.0*  MCV 96.1 95.9  PLT 185 [id number]   Basic Metabolic Panel:  Recent Labs Lab 09/30/15 0903 10/01/15 0424 10/02/15 0415 10/04/15 0350 10/05/15 0440 10/05/15 2232  NA 134* 136 137 137 140 137  K 3.6 4.3 4.6 3.5 3.6 3.8  CL 102 101 102 102 106 100*  CO2 25 29 30 29 28 29   GLUCOSE 214* 130* 114* 111* 102* 125*  BUN 6 5* 7 8 9 10   CREATININE 0.58 0.56 0.59 0.49 0.58 0.67  CALCIUM 8.0* 8.0* 8.3* 8.2* 8.4* 8.6*  MG 2.0  --   --   --   --   --    GFR: Estimated Creatinine Clearance: 62 mL/min (by C-G formula based on Cr of 0.67). Liver Function Tests:  Recent Labs Lab 10/01/15 0424 10/05/15 2232  AST 27 18  ALT 35 24  ALKPHOS 76 100  BILITOT 0.8 0.6  PROT 4.5* 5.3*  ALBUMIN 2.0* 2.3*    Recent Labs Lab 10/01/15 0424  LIPASE 66*   No results for input(s): AMMONIA in the last 168 hours. Coagulation Profile: No results for input(s): INR, PROTIME in the last 168 hours. Cardiac Enzymes:  Recent Labs Lab 10/05/15 2232  TROPONINI <0.03   BNP (last 3 results) No results for input(s): PROBNP in the last 8760 hours. HbA1C: No results for input(s): HGBA1C in the last 72 hours. CBG:  Recent Labs Lab 10/02/15 0749 10/03/15 0744 10/04/15 0741 10/05/15 0758 10/06/15 0755  GLUCAP 117* 171* 128* 98 157*   Lipid Profile: No results for input(s): CHOL, HDL, LDLCALC, TRIG, CHOLHDL, LDLDIRECT in the last 72 hours. Thyroid Function Tests: No results for input(s): TSH, T4TOTAL, FREET4, T3FREE, THYROIDAB in the last 72 hours. Anemia Panel: No results for input(s): VITAMINB12, FOLATE, FERRITIN, TIBC, IRON, RETICCTPCT in the last 72 hours. Sepsis Labs: No results for input(s): PROCALCITON, LATICACIDVEN in the last 168 hours.  Recent Results (from the past  240 hour(s))  Urine culture     Status: Abnormal   Collection Time: 09/28/15  7:46 PM  Result Value Ref Range Status   Specimen Description URINE, RANDOM  Final   Special Requests NONE  Final   Culture MULTIPLE SPECIES PRESENT, SUGGEST RECOLLECTION (A)  Final   Report Status 09/30/2015 FINAL  Final  Culture, Urine     Status: Abnormal   Collection Time: 09/29/15 11:48 AM  Result Value Ref Range Status   Specimen Description URINE, CLEAN CATCH  Final   Special Requests Normal  Final   Culture MULTIPLE SPECIES PRESENT, SUGGEST RECOLLECTION (A)  Final   Report Status 09/30/2015 FINAL  Final  Culture, body fluid-bottle     Status: None   Collection Time: 09/29/15  4:48 PM  Result Value Ref Range Status   Specimen Description FLUID RIGHT PLEURAL  Final   Special Requests BOTTLES DRAWN AEROBIC AND ANAEROBIC 10CC  Final   Culture   Final    NO GROWTH 5 DAYS Performed at [facility]  Report Status 10/04/2015 FINAL  Final  Gram stain     Status: None   Collection Time: 09/29/15  4:48 PM  Result Value Ref Range Status   Specimen Description FLUID RIGHT PLEURAL  Final   Special Requests NONE  Final   Gram Stain   Final    MODERATE WBC PRESENT,BOTH PMN AND MONONUCLEAR NO ORGANISMS SEEN Performed at [facility]    Report Status 09/29/2015 FINAL  Final  Culture, body fluid-bottle     Status: None (Preliminary result)   Collection Time: 10/02/15  3:54 PM  Result Value Ref Range Status   Specimen Description FLUID PLEURAL  Final   Special Requests NONE  Final   Culture   Final    NO GROWTH 4 DAYS Performed at [facility]    Report Status PENDING  Incomplete  Gram stain     Status: None   Collection Time: 10/02/15  3:54 PM  Result Value Ref Range Status   Specimen Description FLUID PLEURAL  Final   Special Requests NONE  Final   Gram Stain   Final    MODERATE WBC PRESENT,BOTH PMN AND MONONUCLEAR NO ORGANISMS SEEN Performed at [facility]     Report Status 10/02/2015 FINAL  Final         Radiology Studies: Dg Chest 2 View  10/05/2015  CLINICAL DATA:  70 year old female with shortness of Breath. Loculated posterior right hydro-pneumothorax following recent thoracentesis for progressive right pleural effusion. Initial encounter. EXAM: CHEST  2 VIEW COMPARISON:  Chest CT 10/03/2015 and earlier. FINDINGS: Moderate-sized posterior loculated right hydro pneumothorax probably is not significantly changed from the recent CT. Additional abnormal pleural thickening and nodularity was noted on that study. There is no apical pneumothorax. Right lung ventilation is stable. Small layering left pleural effusion is stable. Stable cardiac size and mediastinal contours. No pulmonary edema. No acute osseous abnormality identified. Stable cholecystectomy clips. Calcified aortic atherosclerosis. IMPRESSION: 1. Stable since the chest CT 10/03/2015. Moderate size loculated posterior right hydro-pneumothorax with additional pleural and parenchymal abnormalities as described on that study. 2. Small layering left pleural effusion. 3. No new cardiopulmonary abnormality. Calcified aortic atherosclerosis. Electronically Signed   By: Genevie Ann M.D.   On: 10/05/2015 09:19   Dg Chest Port 1 View  10/05/2015  CLINICAL DATA:  Shortness of breath, weakness, chronic pleural effusion EXAM: PORTABLE CHEST 1 VIEW COMPARISON:  10/05/2015 at 0850 hours FINDINGS: Moderate right and small left pleural effusions. Associated right lower lobe opacity, likely atelectasis. No pneumothorax. The heart is normal in size. IMPRESSION: Moderate right and small left pleural effusions. Electronically Signed   By: Julian Hy M.D.   On: 10/05/2015 21:42        Scheduled Meds: . diatrizoate meglumine-sodium  30 mL Oral Once  . docusate sodium  100 mg Oral BID  . enoxaparin (LOVENOX) injection  40 mg Subcutaneous Q24H  . ipratropium  0.5 mg Nebulization TID  . levalbuterol  1.25 mg  Nebulization TID  . lidocaine  1 patch Transdermal Q24H  . loratadine  10 mg Oral Daily  . metoprolol succinate  25 mg Oral QHS  . metoprolol succinate  50 mg Oral Daily  . mometasone-formoterol  2 puff Inhalation BID  . nicotine  21 mg Transdermal Daily  . pantoprazole  40 mg Oral BID  . polyethylene glycol  17 g Oral Daily   Continuous Infusions: . sodium chloride 50 mL/hr at 10/06/15 0634     LOS: 7  days        Tawni Millers, MD [facility] Pager [phone]  If 7PM-7AM, please contact night-coverage [email or web address] Password [email or web address] 10/06/2015, 2:36 PM

## 2015-10-06 NOTE — Progress Notes (Signed)
Occupational Therapy Treatment Patient Details Name: [name] [name] MRN: [id number] DOB: 06-Jul-1945 Today's Date: 10/06/2015    History of present illness 70 y.o. female with medical history significant of COPD, asthma, tobacco abuse, hyperlipidemia, GERD, depression, anxiety, chronic diarrhea, CAD, granulomatosis with polyangiitis, recent admission due to acute pancreatitis with pseudocyst and admitted 09/28/15 for acute on chronic respiratory failure with hypoxia   OT comments  Pt sad.  Encouraged family to bring pt music as she is tired of hospital room and environment  Follow Up Recommendations  Home health OT    Equipment Recommendations  None recommended by OT       Precautions / Restrictions Precautions Precautions: Fall Precaution Comments: monitor sats Restrictions Weight Bearing Restrictions: No       Mobility Bed Mobility Overal bed mobility: Modified Independent             General bed mobility comments: hob elevated. some reliance on bedrails  Transfers Overall transfer level: Needs assistance Equipment used: 1 person hand held assist Transfers: Sit to/from [facility] Sit to Stand: Min assist Stand pivot transfers: Min assist       General transfer comment: for safety    Balance                                   ADL Overall ADL's : Needs assistance/impaired     Grooming: Sitting;Set up                   Toilet Transfer: Min guard;Ambulation   Toileting- Clothing Manipulation and Hygiene: Minimal assistance;Sit to/from stand;Cueing for safety         General ADL Comments: pt feels SOB. RN notified. Pt agreed to sit in chair .  SAts 94 on 1.5 L                Cognition   Behavior During Therapy: WFL for tasks assessed/performed Overall Cognitive Status: Within Functional Limits for tasks assessed                       Extremity/Trunk Assessment                     General  Comments      Pertinent Vitals/ Pain       Pain Assessment: Faces Faces Pain Scale: Hurts a little bit Pain Location: back Pain Descriptors / Indicators: Sore Pain Intervention(s): Limited activity within patient's tolerance;Repositioned     Prior Functioning/Environment              Frequency Min 2X/week     Progress Toward Goals  OT Goals(current goals can now be found in the care plan section)  Progress towards OT goals: Progressing toward goals            End of Session     Activity Tolerance Patient tolerated treatment well   Patient Left in bed;with call bell/phone within reach;with bed alarm set   Nurse Communication          Time: [id number] OT Time Calculation (min): 35 min  Charges: OT General Charges $OT Visit: 1 Procedure OT Treatments $Self Care/Home Management : 23-37 mins  Sergio Hobart, Thereasa Parkin 10/06/2015, 12:07 PM

## 2015-10-06 NOTE — Progress Notes (Signed)
Blood gas and other labs ordered on patient. Will continue to monitor and follow any new orders given.

## 2015-10-06 NOTE — Progress Notes (Signed)
PULMONARY / CRITICAL CARE MEDICINE   Name: [name] [name] MRN: [id number] DOB: 1945/03/31    ADMISSION DATE:  09/28/2015 CONSULTATION DATE:  10/03/2015   REFERRING MD:  [facility] / Dr. Cathlean Sauer  CHIEF COMPLAINT:  Recurrent right pleural effusio  HISTORY OF PRESENT ILLNESS:   History is gained from review of the chart, talking to the patient and talking to primary pulmonologist Dr. Gerrit Friends DeDios.   It appears in May 2016 she had a CT MG gram of the chest that did not show any pulmonary issues including infiltrates or pulmonary embolism. Then early in 2017 she had microscopic hematuria that was evaluated by urology with a CT abdomen mid March 2017 that showed normal pancreas but had a 3.5 cm right lower lobe cavitating mass. This was followed up by a CT chest 06/26/2015 that confirm the 3.5 cm right lower lobe thick-walled cavitating mass along with a left adrenal adenoma. The mediastinum itself was normal. She was then referred to Dr. Gerrit Friends DeDios for evaluation. She underwent CT-guided transthoracic needle biopsy 07/17/2015 that in discussion with the pathologist showed vasculitic features. I'm not sure if there was granuloma or not. 07/24/2015 she underwent limited autoimmune workup that showed screening p-ANCA positive (no c-anca, pr3 or mpo avail). Then 08/06/15 she had full panel with Dr Trudie Reed and per phone call  PR-3 and MPO antibody results were NEGATIVE and only P-ANCA was trace posiotive. 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 Patient then committed to high-dose prednisone and also Rituxan once a week for 70 weeks infusion. She completed 3 weeks of this infusion he did she was supposed to get her 70th dose a little 10/01/2015 on 09/24/2015 but she missed it because of illness.  She believes ever since starting high-dose prednisone she started developing abdominal pain. A CT abdomen 09/23/2015 showed the right lower lobe mass at 2.6 cm perhaps smaller  but obscured by a right pleural effusion that was small. But she had acute pancreatitis features with small pseudocyst.  She blames the high-dose prednisone for her current problems. She does not think Rituxan is the causative agent for this.   She reports she started developing abdominal pain and finally was admitted 09/28/2015. Since admission the right pleural effusion got worse and she had a thoracentesis 09/29/2015 that showed idiopathic exudate with acute inflammatory cells and mesothelial cells with an LDH of 400. Thoracentesis was repeated 10/02/2015 with an LDH of 336. This was complicated by small right basilar pneumothorax on chest x-ray today 10/03/2015 which radiology is conservatively following.  Given her pulmonary problems PCCM consulted for evaluation. She feels miserable because of a bowel pain and depressed and also has some Cushing features. She blames all her problems on prednisone.   Of note she stopped the prednisone after admission and does not want to go back on it again  SUBJECTIVE:  Pt reports she does not feel much better physically.  She states she is depressed from being in the bed / room all the time.  Concerned she is not improving and frustrated as she feels her "doctor didn't listen when she said she was in pain".    VITAL SIGNS: BP 113/58 mmHg  Pulse 97  Temp(Src) 98 F (36.7 C) (Oral)  Resp 20  Ht 5\' 3"  (1.6 m)  Wt 153 lb 3.5 oz (69.5 kg)  BMI 27.15 kg/m2  SpO2 92%  INTAKE / OUTPUT: I/O last 3 completed shifts: In: 2145.8 [P.O.:840; I.V.:1305.8] Out: 2250 [Urine:2250]  PHYSICAL EXAMINATION: General:  Cushingoid female lying in bed, NAD Psychiatry: Flat affect, tearful   Neuro:  Alert and oriented 3. Speech normal HEENT:  Cushingoid. No neck nodes no elevated JVP Cardiovascular:  Regular rate and rhythm no murmurs Lungs:  Even/non-labored, diminished on R Abdomen:  Appears distended soft but nonspecific a tender Musculoskeletal:  No cyanosis clubbing  edema Skin:  Appears intact in the exposed areas  LABS:  PULMONARY  Recent Labs Lab 10/05/15 2223  PHART 7.473*  PCO2ART 37.0  PO2ART 74.5*  HCO3 26.7*  TCO2 24.2  O2SAT 92.2    CBC  Recent Labs Lab 10/02/15 0415 10/04/15 0350  HGB 10.9* 11.0*  HCT 31.7* 33.0*  WBC 8.2 8.7  PLT 185 227    CHEMISTRY  Recent Labs Lab 09/30/15 0903 10/01/15 0424 10/02/15 0415 10/04/15 0350 10/05/15 0440 10/05/15 2232  NA 134* 136 137 137 140 137  K 3.6 4.3 4.6 3.5 3.6 3.8  CL 102 101 102 102 106 100*  CO2 25 29 30 29 28 29   GLUCOSE 214* 130* 114* 111* 102* 125*  BUN 6 5* 7 8 9 10   CREATININE 0.58 0.56 0.59 0.49 0.58 0.67  CALCIUM 8.0* 8.0* 8.3* 8.2* 8.4* 8.6*  MG 2.0  --   --   --   --   --    Estimated Creatinine Clearance: 62 mL/min (by C-G formula based on Cr of 0.67).    IMAGING x48h  Dg Chest 2 View  10/05/2015  CLINICAL DATA:  70 year old female with shortness of Breath. Loculated posterior right hydro-pneumothorax following recent thoracentesis for progressive right pleural effusion. Initial encounter. EXAM: CHEST  2 VIEW COMPARISON:  Chest CT 10/03/2015 and earlier. FINDINGS: Moderate-sized posterior loculated right hydro pneumothorax probably is not significantly changed from the recent CT. Additional abnormal pleural thickening and nodularity was noted on that study. There is no apical pneumothorax. Right lung ventilation is stable. Small layering left pleural effusion is stable. Stable cardiac size and mediastinal contours. No pulmonary edema. No acute osseous abnormality identified. Stable cholecystectomy clips. Calcified aortic atherosclerosis. IMPRESSION: 1. Stable since the chest CT 10/03/2015. Moderate size loculated posterior right hydro-pneumothorax with additional pleural and parenchymal abnormalities as described on that study. 2. Small layering left pleural effusion. 3. No new cardiopulmonary abnormality. Calcified aortic atherosclerosis. Electronically Signed    By: Genevie Ann M.D.   On: 10/05/2015 09:19   Dg Chest Port 1 View  10/05/2015  CLINICAL DATA:  Shortness of breath, weakness, chronic pleural effusion EXAM: PORTABLE CHEST 1 VIEW COMPARISON:  10/05/2015 at 0850 hours FINDINGS: Moderate right and small left pleural effusions. Associated right lower lobe opacity, likely atelectasis. No pneumothorax. The heart is normal in size. IMPRESSION: Moderate right and small left pleural effusions. Electronically Signed   By: Julian Hy M.D.   On: 10/05/2015 21:42   STUDIES:  7/07  CT Chest >> moderate R hydropneumothorax, abnormal pleural thickening with scattered areas of pleural fluid in the R hemithorax, additional subpleural reticulation / fibrosis with interlobular septal thickening R>L, fluid along the pancreatic body/tail related to known acute pancreatitis   ASSESSMENT / PLAN:  PULMONARY A: -Right lower lobe mass March 2017 along with microscopic hematuria. - ANCA-NEGATIVE  - diagnosis of vasculitis/ GPA made based on April 2017 biopsy (results do not describe granuloma though)  - Status post pred ending 08/2015 due to intolerance & 3 out of 4 doses of weekly Rituxan ending 08/2015 on admit   - LUL Small Irregular Mass   -  Not candidate for procedure / biopsy at this time   - will ned to follow up with Dr. 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  - Cause unknown: She is status post cholecystectomy  - Rituxan can cause abdominal pain and GI perforation up to 14% of cases but pancreatitis itself is not reported. In my personal experience prednisone does not cause pancreatitis   P:   - Attending MD to follow  - Follow up with Pulmonary MD arranged >> 10/30/15 at 3:30  - Follow up repeat serologies - She will need repeat CT of chest as outpatient to review LUL mass - She will need follow up with Dr. Trudie Reed to review immune suppression therapy - IF LUL area, growing on CT, she will need biopsy - PTX mgmt per IR >  IR signed off.  - mobilize, pulmonary hygiene - no further thora for R hydropneumothorax  - Defer mgmt of pancreatitis to primary SVC   Noe Gens, NP-C [facility] Pgr: [phone] or if no answer [phone] 10/06/2015, 9:40 AM

## 2015-10-07 ENCOUNTER — Other Ambulatory Visit: Payer: Self-pay | Admitting: *Deleted

## 2015-10-07 LAB — CULTURE, BODY FLUID W GRAM STAIN -BOTTLE: Culture: NO GROWTH

## 2015-10-07 LAB — BASIC METABOLIC PANEL
ANION GAP: 7 (ref 5–15)
BUN: 8 mg/dL (ref 6–20)
CHLORIDE: 103 mmol/L (ref 101–111)
CO2: 27 mmol/L (ref 22–32)
Calcium: 8.1 mg/dL — ABNORMAL LOW (ref 8.9–10.3)
Creatinine, Ser: 0.47 mg/dL (ref 0.44–1.00)
GFR calc Af Amer: >60 mL/min (ref >60)
GFR calc non Af Amer: >60 mL/min (ref >60)
GLUCOSE: 102 mg/dL — AB (ref 65–99)
POTASSIUM: 2.9 mmol/L — AB (ref 3.5–5.1)
Sodium: 137 mmol/L (ref 135–145)

## 2015-10-07 LAB — CBC WITH DIFFERENTIAL/PLATELET
BASOS PCT: 0 %
Basophils Absolute: 0 10*3/uL (ref 0.0–0.1)
Eosinophils Absolute: 0.1 10*3/uL (ref 0.0–0.7)
Eosinophils Relative: 1 %
HEMATOCRIT: 30.4 % — AB (ref 36.0–46.0)
HEMOGLOBIN: 10 g/dL — AB (ref 12.0–15.0)
LYMPHS ABS: 1.3 10*3/uL (ref 0.7–4.0)
Lymphocytes Relative: 12 %
MCH: 31.7 pg (ref 26.0–34.0)
MCHC: 32.9 g/dL (ref 30.0–36.0)
MCV: 96.5 fL (ref 78.0–100.0)
MONO ABS: 1.4 10*3/uL — AB (ref 0.1–1.0)
MONOS PCT: 13 %
NEUTROS ABS: 7.8 10*3/uL — AB (ref 1.7–7.7)
Neutrophils Relative %: 74 %
Platelets: 219 10*3/uL (ref 150–400)
RBC: 3.15 MIL/uL — ABNORMAL LOW (ref 3.87–5.11)
RDW: 15.8 % — ABNORMAL HIGH (ref 11.5–15.5)
WBC: 10.6 10*3/uL — ABNORMAL HIGH (ref 4.0–10.5)

## 2015-10-07 LAB — ANCA TITERS: C-ANCA: <1:20 {titer}

## 2015-10-07 LAB — CULTURE, BODY FLUID-BOTTLE

## 2015-10-07 LAB — GLUCOSE, CAPILLARY: Glucose-Capillary: 116 mg/dL — ABNORMAL HIGH (ref 65–99)

## 2015-10-07 MED ORDER — POTASSIUM CHLORIDE CRYS ER 20 MEQ PO TBCR
40.0000 meq | EXTENDED_RELEASE_TABLET | Freq: Once | ORAL | Status: AC
  Administered 2015-10-07: 40 meq via ORAL
  Filled 2015-10-07: qty 2

## 2015-10-07 MED ORDER — POTASSIUM CHLORIDE 20 MEQ/15ML (10%) PO SOLN
40.0000 meq | ORAL | Status: DC
  Administered 2015-10-07: 40 meq via ORAL
  Filled 2015-10-07: qty 30

## 2015-10-07 MED ORDER — METRONIDAZOLE 500 MG PO TABS
2000.0000 mg | ORAL_TABLET | Freq: Once | ORAL | Status: AC
  Administered 2015-10-07: 2000 mg via ORAL
  Filled 2015-10-07: qty 4

## 2015-10-07 MED ORDER — POTASSIUM CHLORIDE ER 10 MEQ PO TBCR: 10.0000 meq | EXTENDED_RELEASE_TABLET | Freq: Every day | ORAL | Status: AC

## 2015-10-07 MED ORDER — TRAMADOL HCL 50 MG PO TABS: 50.0000 mg | ORAL_TABLET | Freq: Three times a day (TID) | ORAL | Status: DC | PRN

## 2015-10-07 NOTE — Progress Notes (Signed)
Occupational Therapy Treatment Patient Details Name: [name] [name] MRN: [id number] DOB: January 25, 1946 Today's Date: 10/07/2015    History of present illness 70 y.o. female with medical history significant of COPD, asthma, tobacco abuse, hyperlipidemia, GERD, depression, anxiety, chronic diarrhea, CAD, granulomatosis with polyangiitis, recent admission due to acute pancreatitis with pseudocyst and admitted 09/28/15 for acute on chronic respiratory failure with hypoxia   OT comments  Pt needed max motivation to participate but did!  Follow Up Recommendations  Home health OT;Supervision/Assistance - 24 hour    Equipment Recommendations  3 in 1 bedside comode    Recommendations for Other Services      Precautions / Restrictions Precautions Precautions: Fall Precaution Comments: monitor sats Restrictions Weight Bearing Restrictions: No       Mobility Bed Mobility Overal bed mobility: Modified Independent             General bed mobility comments: hob elevated. some reliance on bedrails  Transfers Overall transfer level: Needs assistance Equipment used: 1 person hand held assist   Sit to Stand: Min assist Stand pivot transfers: Min assist       General transfer comment: for safety        ADL   Eating/Feeding: Set up;Sitting   Grooming: Set up;Sitting                                 General ADL Comments: max A to participate as she stated she didnt fell well.  Once sitting EOB pt did have good participation with grooming. Pt did stand briefly before sitting.                Cognition   Behavior During Therapy: WFL for tasks assessed/performed Overall Cognitive Status: Within Functional Limits for tasks assessed                                    Pertinent Vitals/ Pain       Pain Location: pt complained of fatigue         Frequency Min 2X/week     Progress Toward Goals  OT Goals(current goals can now be found in the  care plan section)  Progress towards OT goals: Progressing toward goals     Plan         End of Session Equipment Utilized During Treatment: Rolling walker   Activity Tolerance Patient tolerated treatment well   Patient Left in bed;with call bell/phone within reach;with bed alarm set   Nurse Communication          Time: 1053-1110 OT Time Calculation (min): 17 min  Charges: OT General Charges $OT Visit: 1 Procedure OT Treatments $Self Care/Home Management : 8-22 mins  Vinny Taranto, Edwena Felty D 10/07/2015, 12:03 PM

## 2015-10-07 NOTE — Progress Notes (Signed)
Notified MD pt c/o chest pain, obtained EKG, showed ST otherwise normal, pt denies pain now, anxious, will give prn for anxiety

## 2015-10-07 NOTE — Progress Notes (Signed)
Pt discharging with [place] with [facility].

## 2015-10-07 NOTE — Progress Notes (Signed)
Patients daughter contacted CN regarding home oxygen delivery.[facility] contacted [phone]. Representative to contact courier for delivery ETA.

## 2015-10-07 NOTE — Progress Notes (Signed)
PULMONARY / CRITICAL CARE MEDICINE   Name: [name] [name] MRN: [id number] DOB: [birth year]    ADMISSION DATE:  09/28/2015 CONSULTATION DATE:  10/03/2015   REFERRING MD:  [facility] / Dr. Cathlean Sauer  CHIEF COMPLAINT:  Recurrent right pleural effusion  HISTORY OF PRESENT ILLNESS:   History is gained from review of the chart, talking to the patient and talking to primary pulmonologist Dr. Gerrit Friends DeDios.   It appears in May 2016 she had a CT MG gram of the chest that did not show any pulmonary issues including infiltrates or pulmonary embolism. Then early in 2017 she had microscopic hematuria that was evaluated by urology with a CT abdomen mid March 2017 that showed normal pancreas but had a 3.5 cm right lower lobe cavitating mass. This was followed up by a CT chest 06/26/2015 that confirm the 3.5 cm right lower lobe thick-walled cavitating mass along with a left adrenal adenoma. The mediastinum itself was normal. She was then referred to Dr. Gerrit Friends DeDios for evaluation. She underwent CT-guided transthoracic needle biopsy 07/17/2015 that in discussion with the pathologist showed vasculitic features. I'm not sure if there was granuloma or not. 07/24/2015 she underwent limited autoimmune workup that showed screening p-ANCA positive (no c-anca, pr3 or mpo avail). Then 08/06/15 she had full panel with Dr Trudie Reed and per phone call  PR-3 and MPO antibody results were NEGATIVE and only P-ANCA was trace posiotive. 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 Patient then committed to high-dose prednisone and also Rituxan once a week for 4 weeks infusion. She completed 3 weeks of this infusion he did she was supposed to get her fourth dose a little 10/01/2015 on 09/24/2015 but she missed it because of illness.  She believes ever since starting high-dose prednisone she started developing abdominal pain. A CT abdomen 09/23/2015 showed the right lower lobe mass at 2.6 cm perhaps smaller  but obscured by a right pleural effusion that was small. But she had acute pancreatitis features with small pseudocyst.  She blames the high-dose prednisone for her current problems. She does not think Rituxan is the causative agent for this.   She reports she started developing abdominal pain and finally was admitted 09/28/2015. Since admission the right pleural effusion got worse and she had a thoracentesis 09/29/2015 that showed idiopathic exudate with acute inflammatory cells and mesothelial cells with an LDH of 400. Thoracentesis was repeated 10/02/2015 with an LDH of 336. This was complicated by small right basilar pneumothorax on chest x-ray today 10/03/2015 which radiology is conservatively following.  Given her pulmonary problems PCCM consulted for evaluation. She feels miserable because of a bowel pain and depressed and also has some Cushing features. She blames all her problems on prednisone.   Of note she stopped the prednisone after admission and does not want to go back on it again  SUBJECTIVE:  Pt reports feeling better, had episode of epigastric chest pain that resolved.  Pt very concerned about returning to any therapies for autoimmune disease.    VITAL SIGNS: BP 122/80 mmHg  Pulse 110  Temp(Src) 99 F (37.2 C) (Oral)  Resp 18  Ht '5\' 3"'$  (1.6 m)  Wt 153 lb 3.5 oz (69.5 kg)  BMI 27.15 kg/m2  SpO2 96%  INTAKE / OUTPUT: I/O last 3 completed shifts: In: 1585.8 [P.O.:480; I.V.:1105.8] Out: 2500 [Urine:2500]  PHYSICAL EXAMINATION: General:  Cushingoid female lying in bed, NAD Psychiatry: Flat affect, more lively with family visiting Neuro:  Alert and  oriented 3. Speech normal HEENT:  Cushingoid. No neck nodes no elevated JVP Cardiovascular:  Regular rate and rhythm no murmurs Lungs:  Even/non-labored, diminished on R Abdomen:  Appears distended soft but nonspecific a tender Musculoskeletal:  No cyanosis clubbing edema Skin:  Appears intact in the exposed  areas  LABS:  PULMONARY  Recent Labs Lab 10/05/15 2223  PHART 7.473*  PCO2ART 37.0  PO2ART 74.5*  HCO3 26.7*  TCO2 24.2  O2SAT 92.2    CBC  Recent Labs Lab 10/02/15 0415 10/04/15 0350 10/07/15 0444  HGB 10.9* 11.0* 10.0*  HCT 31.7* 33.0* 30.4*  WBC 8.2 8.7 10.6*  PLT 185 227 219    CHEMISTRY  Recent Labs Lab 10/02/15 0415 10/04/15 0350 10/05/15 0440 10/05/15 2232 10/07/15 0444  NA 137 137 140 137 137  K 4.6 3.5 3.6 3.8 2.9*  CL 102 102 106 100* 103  CO2 '30 29 28 29 27  '$ GLUCOSE 114* 111* 102* 125* 102*  BUN '7 8 9 10 8  '$ CREATININE 0.59 0.49 0.58 0.67 0.47  CALCIUM 8.3* 8.2* 8.4* 8.6* 8.1*   Estimated Creatinine Clearance: 62 mL/min (by C-G formula based on Cr of 0.47).    IMAGING x48h  Dg Chest 2 View  10/06/2015  CLINICAL DATA:  Worsening shortness of breath, left-sided chest pain since last night. EXAM: CHEST  2 VIEW COMPARISON:  Chest x-ray dated 10/05/2015 and chest CT dated 10/03/2015. FINDINGS: Cardiomediastinal silhouette is stable in size and configuration. The hydropneumothorax at the right lung base appears stable compared to the previous chest x-ray and chest CT. Stable small left pleural effusion and/or atelectasis. No new lung findings. Upper lungs remain clear. IMPRESSION: Stable findings compared to yesterday's chest x-ray. Electronically Signed   By: Franki Cabot M.D.   On: 10/06/2015 16:26   Dg Chest Port 1 View  10/05/2015  CLINICAL DATA:  Shortness of breath, weakness, chronic pleural effusion EXAM: PORTABLE CHEST 1 VIEW COMPARISON:  10/05/2015 at 0850 hours FINDINGS: Moderate right and small left pleural effusions. Associated right lower lobe opacity, likely atelectasis. No pneumothorax. The heart is normal in size. IMPRESSION: Moderate right and small left pleural effusions. Electronically Signed   By: Julian Hy M.D.   On: 10/05/2015 21:42   STUDIES:  7/07  CT Chest >> moderate R hydropneumothorax, abnormal pleural thickening with  scattered areas of pleural fluid in the R hemithorax, additional subpleural reticulation / fibrosis with interlobular septal thickening R>L, fluid along the pancreatic body/tail related to known acute pancreatitis 7/03  Cytology from [facility] [place] >> acute inflammation, no malignant cells 7/07  Cytology from [facility] >> no malignant cells   CULTURES:  UC 7/2 >> recollection suggested UC 7/3 >> recollection suggested Pleural Fluid R 7/3 >> negative  Pleural Fluid R 7/6 >>  AUTOIMMUNE: ESR 7/7 >> 45 GBM 7/7 >>  ANCA 7/7 >> negative  ASSESSMENT / PLAN:  PULMONARY A: -Right lower lobe mass March 2017 along with microscopic hematuria. - ANCA-NEGATIVE  - diagnosis of vasculitis/ GPA made based on April 2017 biopsy (results do not describe granuloma though)  - Status post pred ending 08/2015 due to intolerance & 3 out of 4 doses of weekly Rituxan ending 08/2015 on admit   - LUL Small Irregular Mass - suspect related to ongoing issues with vasculitis.  Malignancy in differential with smoking history but it appeared quickly & there was mild pleural thickening in that area on CT scan in 05/2015, suspect inflammatory in nature.    -  Not candidate for procedure / biopsy at this time   - will ned to follow up with Dr. Corrie Dandy as outpatient with repeat CT scan in one to two weeks  -after repeat CT, consider biopsy of LUL mass   - Acute exudative right pleural effusion - 09/29/15 idiopathic with acute inflammatory cells following the diagnosis of pancreatitis   - this is likely reactive to the pancreatitis   - Small right pneumothorax - s/p thoracentesis, basilar / hydropneumothorax  -  following second thoracentesis of the right pleural effusion on 10/02/2015  -  No interventions required    - Tobacco Abuse - 1/2ppd since age 47, current smoker  - smoking cessation counseling    - Acute on chronic pancreatitis with pseudocyst resulting in admission 09/28/2015.  - Cause unknown: She is status post  cholecystectomy  - Rituxan can cause abdominal pain and GI perforation up to 14% of cases but pancreatitis itself is not reported.    P:   - Follow up with Pulmonary MD arranged >> 10/30/15 at 3:30  - Follow up repeat serologies - She will need repeat CT of chest as outpatient to review LUL mass - She will need follow up with Dr. Trudie Reed to review immune suppression therapy - IF LUL area, growing on CT, she will need biopsy - PTX per IR /  IR signed off  - Follow up CXR in am - mobilize, pulmonary hygiene - ambulatory O2 assessment prior to discharge  - no further thora for R hydropneumothorax  - Defer mgmt of pancreatitis to primary SVC   GLOBAL:  Suspect she is getting close to discharge - likely will need PT.  She has pulmonary follow up arranged at discharge.  Will need assessment for O2 prior to discharge.    Noe Gens, NP-C [facility] Pgr: [phone] or if no answer [phone] 10/07/2015, 9:26 AM

## 2015-10-07 NOTE — Progress Notes (Signed)
SATURATION QUALIFICATIONS: (This note is used to comply with regulatory documentation for home oxygen)  Patient Saturations on Room Air at Rest = 92-94%  Patient Saturations on Room Air while Ambulating = 82-84%  Patient Saturations on 2 Liters of oxygen while Ambulating = 92-93%  Please briefly explain why patient needs home oxygen:

## 2015-10-07 NOTE — Progress Notes (Signed)
Ambulated patient last night with O2 @ 2L/min, well tolerated with O2sat of 92-93% while ambulating. Ambulated patient this morning on RA, desat to 82-84%. Patient cannot tolerate walking on RA, placed back on O2 @ 2L/min and O2sat bumped up to 94%.

## 2015-10-07 NOTE — Consult Note (Signed)
   [facility] CM Inpatient Consult   10/07/2015  [name] [name] [birth year] [id number]   Patient evaluated for long-term disease management services with [place] Management program. Martin Majestic to bedside to discuss and offer [place] Management services. Patient is agreeable and written consent signed. Explained to patient that she will receive post hospital transition of care calls and will be evaluated for monthly home visits. Confirmed Primary Care MD as Dr. Quay Burow.  Confirmed best contact number as [phone] or cell [phone]. Explained that [place] Management will not interfere or replace services provided by home health. [name] [name] reports she lives alone and has 2 supportive daughters who take her to MD appointments. She states she has had issues in the past with affording one of her expensive medications. However, she states she now getting assistance thru the pharmaceutical company for that medication. She does not know the name of it at the time. [name] [name] denies having trouble affording medications at this current time however. She expresses that she has only limited income and inquires about the patient accounting number for her hospital bill. Number provided.  Left [facility] Care Management packet and contact information at bedside. Made inpatient RNCM aware that patient will be followed by [place] Management post hospital discharge. Confirmed that she will have home health as well. Of note, [name] [name] states her COPD diagnosis is recent. Will request for COPD disease management and education for her by [facility] RNCM.  Will request for [name] [name] to be assigned to [facility] and will request for her to be assessed for [facility] LCSW for financial concerns and [place] for affordability of medications.   [name], MSN-Ed, RN,BSN [facility] Liaison [phone]

## 2015-10-07 NOTE — Discharge Summary (Signed)
[name] [name], is a 70 y.o. female  DOB 04/05/1945  MRN [id number].  Admission date:  09/28/2015  Admitting Physician  Ivor Costa, MD  Discharge Date:  10/07/2015   Primary MD  [name], MD  Recommendations for primary care physician for things to follow:   Patient is been discharge home with instructions follow-up with the pulmonary clinic and primary care physician. Patient is being discharged with home oxygen, home health services have been arranged. Patient has a right pleural effusion that has been stable through the last 4 days. She'll need a follow-up CT scan as an outpatient in the pulmonary clinic.    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.30] Other acute pancreatitis [K85.8]    Principal Problem:   Acute on chronic respiratory failure with hypoxia ([place]) Active Problems:   Hereditary hemochromatosis (HCC)   Essential hypertension   GERD   Asthma   CAD (coronary artery disease)   History of tobacco abuse   Hypokalemia   COPD (chronic obstructive pulmonary disease) with emphysema (HCC)   Other mixed anxiety disorders   Wegener's granulomatosis with renal involvement (HCC)   Pancreatitis   Pancreatic pseudocyst   Bilateral pleural effusion   UTI (lower urinary tract infection)   Pleural effusion, right   Cavitating mass in right lower lung lobe   Wegener's granulomatosis (granulomatosis with polyangiitis) (HCC)      Past Medical History  Diagnosis Date  . Hereditary hemochromatosis ([place]) 2004  . ASTHMA   . HYPERTENSION   . GERD   . Chronic kidney disease   . Arthritis     left shoulder   .  Headache(784.0)     sinus headaches   . H/O hiatal hernia   . Anxiety   . Hyperlipidemia   . CAD (coronary artery disease)   . Depression   . Hypertension   . Urinary tract infection   . COPD (chronic obstructive pulmonary disease) with emphysema ([place])     mild dz on PFTs 11/2013  . 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 Hx of adenomatous polyp of colon 07/25/2014  . Osteopenia 10/07/2014    DEXA @ [facility] 09/2014: -1.9 L fem  . Other mixed anxiety disorders 06/16/2015  . 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 PONV (postoperative nausea and vomiting)     Past Surgical History  Procedure Laterality Date  . Cholecystectomy  03/29/09  . Appendectomy  03/29/81  . Abdominal hysterectomy  03/29/81  . Bladder tack  03/29/2002  . Other surgical history      cyst removed from intestines to ovary    . Other surgical history      tendon surgery left wrist   . Cystoscopy  01/24/14    NEGATIVE;Dr Ottelin  . Colonoscopy w/ biopsies    .  Esophagogastroduodenoscopy         HPI  from the history and physical done on the day of admission:    This is a 70 year old female who presents to hospital with the chief complaint of abdominal pain, shortness of breath and dysuria. Patient was recently hospitalized for pancreatitis with pseudocyst formation, her pain was epigastric, sharp in nature, 10 out of 10 intensity, nonradiating. It was associated with nausea but no vomiting. On initial physical examination her blood pressure was 123/67, heart rate was 101, respiratory rate was 22 with an oxygen saturation 97% on supplemental oxygen. She was awake and alert, her heart had S1-S2 present, tachycardic, lungs had no wheezing rales or rhonchi, her abdomen was soft but tender to deep palpation of the previous area lower extremities had no significant edema. Her serum sodium was 136, her potassium was 3.3 creatinine was 0.65, her  white cell count was 11.2 with hemoglobin 12.3. Her urinalysis had moderate leukocytes with 6-30 white cells, check was are present. Her chest showed worsening pleural effusions small right and left.  Patient was admitted to hospital with the  working diagnosis of worsening pleural effusions complicated by acute on chronic pancreatitis.     Hospital Course:   1. Cardiovascular. Patient remained hemodynamically stable. Hypertension .She was continued on metoprolol with no major complications. Patient received IV fluids with no signs of volume overload.  2. Pulmonary. Right exudative pleural effusion Patient had  thoracentesis performed for the right pleural effusion that resulted in a exudate. Her effusion had acute recurrence and had to be re-tapped by ultrasound guidance. Patient developed a small pneumothorax, that has remained stable. A small cavitary lesion was found in the right lower lobe, but 2.8 cm. It was presumed to be related to her Wegener's granulomatosis. Malignancy cannot be ruled out. The plan is to check a CT scan as an outpatient,  patient may need biopsy with VATS procedure. Patient declined further therapy with prednisone due to concern of side effects. Patient will go home with home oxygen and home health services. She'll need to follow-up with rheumatologist as well.  3. Nephrology. Patient's kidney function remained stable throughout her hospital stay. Hypokalemia. Patient received potassium supplements for repletion.  4. Gastrology. Prostatitis with pseudocyst. Patient received conservative care. Pain control, IV fluids. Patient diet was advanced progressively. By the time of discharge patient is tolerating po by mouth. She does have a decreased oral intake due to poor appetite and chronic deconditioning. Her pseudocyst was determined  to be stable and no acute intervention needed.  5. Urology. Patient was treated for trichomonas with metronidazole.  6. Neurology.  Deconditioning. Patient was seen by physical therapy. Unfortunately she did not qualify for skilled nursing facility.  Discharge Condition: stable  Follow UP the pulmonary clinic as an outpatient    Consults obtained - pulmonary  Diet and Activity recommendation: See Discharge Instructions below  Discharge Instructions     Discharge Instructions    AMB Referral to [place] Management    Complete by:  As directed   Please assign to [facility] for transition of care and COPD disease and symptom management. Written consent assigned. Upon toc period, please assess need for [facility] Pharmacist for potential medication affordability concerns and [facility] LCSW referral for potential financial concerns. Please assess if patient needs other disciplines during toc period. Please call with questions. Thanks. [name], MSN-Ed, RN,BSN [facility] [phone]  Reason for consult:  Please assibn  Diagnoses of:  COPD/  Pneumonia  Expected date of contact:  1-3 days (reserved for hospital discharges)             Discharge Medications       Medication List    ASK your doctor about these medications        acetaminophen 500 MG tablet  Commonly known as:  TYLENOL  Take 500-1,000 mg by mouth every 8 (eight) hours as needed for mild pain or moderate pain.     albuterol (2.5 MG/3ML) 0.083% nebulizer solution  Commonly known as:  PROVENTIL  Take 3 mLs (2.5 mg total) by nebulization every 6 (six) hours as needed for wheezing or shortness of breath.     aspirin 81 MG tablet  Take 362 mg by mouth once.     budesonide-formoterol 160-4.5 MCG/ACT inhaler  Commonly known as:  SYMBICORT  Inhale 2 puffs into the lungs 2 (two) times daily.     diazepam 5 MG tablet  Commonly known as:  VALIUM  Take 0.5-1 tablets (2.5-5 mg total) by mouth every 12 (twelve) hours as needed for anxiety.     fexofenadine 180 MG tablet  Commonly known as:  ALLEGRA  Take 180 mg by mouth at bedtime.       fluconazole 100 MG tablet  Commonly known as:  DIFLUCAN  Take 1 tablet (100 mg total) by mouth daily.     fluticasone furoate-vilanterol 100-25 MCG/INH Aepb  Commonly known as:  BREO ELLIPTA  Inhale 1 puff into the lungs daily.     loperamide 2 MG capsule  Commonly known as:  IMODIUM  Take 2 mg by mouth as needed for diarrhea or loose stools.     Melatonin 1 MG Tabs  Take 1 mg by mouth at bedtime.     metoprolol succinate 50 MG 24 hr tablet  Commonly known as:  TOPROL-XL  Take 1 tablet (50 mg total) by mouth daily. Take with or immediately following a meal.     metoprolol succinate 25 MG 24 hr tablet  Commonly known as:  TOPROL XL  Take 1 tablet (25 mg total) by mouth at bedtime.     omeprazole 20 MG capsule  Commonly known as:  PRILOSEC  Take 1 capsule (20 mg total) by mouth 2 (two) times daily before a meal.     ondansetron 8 MG disintegrating tablet  Commonly known as:  ZOFRAN ODT  Take 1 tablet (8 mg total) by mouth every 8 (eight) hours as needed for nausea or vomiting.     oxyCODONE 5 MG immediate release tablet  Commonly known as:  Oxy IR/ROXICODONE  Take 1 tablet (5 mg total) by mouth every 6 (six) hours as needed for severe pain.     riTUXimab in sodium chloride 0.9 % 250 mL  Inject into the vein See admin instructions. She is receiving 4 doses with Dr. Gavin Pound at [facility]. Her next dose is due on 09/24/15.     simethicone 125 MG chewable tablet  Commonly known as:  MYLICON  Chew 0000000 mg by mouth every 6 (six) hours as needed for flatulence.     sulfamethoxazole-trimethoprim 800-160 MG tablet  Commonly known as:  BACTRIM DS,SEPTRA DS  Take 1 tablet by mouth 3 (three) times a week.        Major procedures and Radiology Reports - PLEASE review detailed and final reports for all details, in brief -      Dg Chest 1 View  10/03/2015  CLINICAL DATA:  Follow-up thoracentesis ; previously identified right-sided basilar pneumothorax. EXAM:  CHEST 1 VIEW COMPARISON:  Chest x-ray of October 02, 2015 FINDINGS: Again demonstrated is a small right basilar pneumothorax. It appears stable in volume. No significant apical component is observed. There is no mediastinal shift. There is a small left pleural effusion with minimal left basilar atelectasis. The heart and pulmonary vascularity are normal. IMPRESSION: Stable small right sided basilar pneumothorax. Stable left lower lobe atelectasis and small effusion. Electronically Signed   By: David  Martinique M.D.   On: 10/03/2015 07:44   Dg Chest 1 View  10/02/2015  CLINICAL DATA:  RIGHT side pneumothorax postprocedural EXAM: CHEST 1 VIEW COMPARISON:  Portable exam 1845 hours compared to 1553 hours FINDINGS: Slight decrease in size of previously identified RIGHT basilar pneumothorax. Stable heart size and mediastinal contours. Bibasilar atelectasis and small LEFT pleural effusion. Bones unremarkable. IMPRESSION: Slight decrease in size of previously identified RIGHT basilar pneumothorax. Electronically Signed   By: Lavonia Dana M.D.   On: 10/02/2015 18:55   Dg Chest 1 View  10/02/2015  CLINICAL DATA:  Post right thoracentesis EXAM: CHEST 1 VIEW COMPARISON:  7/6/ 17 FINDINGS: Cardiomediastinal silhouette is stable. Mild right basilar atelectasis. Right pleural effusions has resolved. Trace left pleural effusion with left basilar atelectasis. There is a small loculated right basilar pneumothorax measure about 1.3 cm maximum thickness. IMPRESSION: Small loculated right basilar pneumothorax measures about 1.3 cm maximum thickness. Electronically Signed   By: Lahoma Crocker M.D.   On: 10/02/2015 16:05   Dg Chest 1 View  09/29/2015  CLINICAL DATA:  Right-sided thoracentesis.Reportedly 1.4 L of fluid was removed. Patient is still having shortness of breath. EXAM: CHEST 1 VIEW COMPARISON:  09/28/2015. FINDINGS: The RIGHT pleural effusion is diminished. There is improved opacity at the RIGHT base, likely resolving atelectasis.  LEFT pleural effusion appears stable compared with priors. The heart size is normal. No visible RIGHT pneumothorax. IMPRESSION: Decreased RIGHT pleural effusion post thoracentesis. No pneumothorax is evident. Mild atelectasis RIGHT base. Electronically Signed   By: Staci Righter M.D.   On: 09/29/2015 17:09   Dg Chest 2 View  10/06/2015  CLINICAL DATA:  Worsening shortness of breath, left-sided chest pain since last night. EXAM: CHEST  2 VIEW COMPARISON:  Chest x-ray dated 10/05/2015 and chest CT dated 10/03/2015. FINDINGS: Cardiomediastinal silhouette is stable in size and configuration. The hydropneumothorax at the right lung base appears stable compared to the previous chest x-ray and chest CT. Stable small left pleural effusion and/or atelectasis. No new lung findings. Upper lungs remain clear. IMPRESSION: Stable findings compared to yesterday's chest x-ray. Electronically Signed   By: Franki Cabot M.D.   On: 10/06/2015 16:26   Dg Chest 2 View  10/05/2015  CLINICAL DATA:  70 year old female with shortness of Breath. Loculated posterior right hydro-pneumothorax following recent thoracentesis for progressive right pleural effusion. Initial encounter. EXAM: CHEST  2 VIEW COMPARISON:  Chest CT 10/03/2015 and earlier. FINDINGS: Moderate-sized posterior loculated right hydro pneumothorax probably is not significantly changed from the recent CT. Additional abnormal pleural thickening and nodularity was noted on that study. There is no apical pneumothorax. Right lung ventilation is stable. Small layering left pleural effusion is stable. Stable cardiac size and mediastinal contours. No pulmonary edema. No acute osseous abnormality identified. Stable cholecystectomy clips. Calcified aortic atherosclerosis. IMPRESSION: 1. Stable since the chest CT 10/03/2015. Moderate size loculated posterior right hydro-pneumothorax with additional pleural and parenchymal abnormalities as described on that study. 2. Small layering  left pleural  effusion. 3. No new cardiopulmonary abnormality. Calcified aortic atherosclerosis. Electronically Signed   By: Genevie Ann M.D.   On: 10/05/2015 09:19   Dg Chest 2 View  10/02/2015  CLINICAL DATA:  Dyspnea, history of asthma, COPD, coronary artery disease, current smoker. EXAM: CHEST  2 VIEW COMPARISON:  Chest x-ray of October 01, 2015 FINDINGS: There has been interval increase in the volume of pleural fluid on the right which now occupies approximately 1/4 of the pleural space volume. There is no mediastinal shift. The heart is normal in size. The pulmonary vascularity is not engorged. The small left pleural effusion is stable. IMPRESSION: Mild further accumulation of pleural fluid on the right. Stable small left pleural effusion. Electronically Signed   By: David  Martinique M.D.   On: 10/02/2015 08:37   Dg Chest 2 View  10/01/2015  CLINICAL DATA:  Right-sided chest pain, hypoxia, former smoking history EXAM: CHEST  2 VIEW COMPARISON:  Chest x-ray of 09/29/2015 and CT chest of 06/26/2015 FINDINGS: There has been reaccumulation of a moderate size right pleural effusion with a small left pleural effusion present. Bibasilar atelectasis remains right greater than left. Heart size is stable. No bony abnormality is seen. IMPRESSION: Reaccumulation of moderate size right pleural effusion with small left pleural effusion remaining. Electronically Signed   By: Ivar Drape M.D.   On: 10/01/2015 08:44   Dg Chest 2 View  09/28/2015  CLINICAL DATA:  Low oxygen saturation. EXAM: CHEST  2 VIEW COMPARISON:  Chest radiographs 09/23/2015. Lung bases from CT abdomen 09/23/2015. FINDINGS: Increased size of right pleural effusion, now at least moderate in degree. Increased size of left pleural effusion, small. Increasing right perihilar opacity may be compressive atelectasis secondary to pleural fluid. Cardiomediastinal contours are unchanged. No pneumothorax. No definite pulmonary edema. IMPRESSION: Increasing bilateral  pleural effusions from exam 5 days prior, now moderate in degree on the right, and small on the left. Associated right perihilar opacity may be secondary to compressive atelectasis. Electronically Signed   By: Jeb Levering M.D.   On: 09/28/2015 21:02   Ct Chest Wo Contrast  10/03/2015  CLINICAL DATA:  Acute pancreatitis, acute on chronic respiratory failure with hypoxia, right lower lobe lung mass EXAM: CT CHEST WITHOUT CONTRAST TECHNIQUE: Multidetector CT imaging of the chest was performed following the standard protocol without IV contrast. COMPARISON:  Chest radiograph dated 10/03/2015. Partial comparison to CT abdomen pelvis dated 09/29/2015. PET-CT dated 07/07/2015. CT chest dated 06/26/2015. FINDINGS: Cardiovascular: The heart is normal in size. No pericardial effusion. Coronary atherosclerosis of the LAD. Atherosclerotic calcifications of the aortic arch. Mediastinum/Nodes: Small mediastinal lymph nodes which do not meet pathologic CT size criteria. No suspicious axillary lymphadenopathy. Visualized thyroid is unremarkable. Lungs/Pleura: Moderate right hydropneumothorax. Abnormal pleural thickening in the right hemithorax with additional scattered areas of loculated pleural fluid in the right hemithorax, including along the right major fissure. No convincing solid pleural component. Irregular 14 mm subpleural nodule in the left upper lobe (series 5/images 18-19), new from prior CT. No additional pulmonary nodules. Prior cavitary lesion in the posterior right lower lobe is no longer visualized, although possibly obscured. Underlying subpleural reticulation/fibrosis with interlobular septal thickening, right lung predominant. Overall, this appearance is nonspecific, but would be compatible with vasculitis with superimposed hydropneumothorax. Neoplasm is not entirely excluded, but is considered less likely given the imaging findings and prior biopsy results. Upper Abdomen: Mild thickening of the left  adrenal gland. Fluid along the pancreatic body/ tail (series 2/ image 129), related to  known acute pancreatitis, incompletely visualized. Musculoskeletal: Mild degenerative changes of the visualized thoracolumbar spine. IMPRESSION: Moderate right hydropneumothorax. Additional abnormal pleural thickening with scattered areas of pleural fluid in the right hemithorax. Additional subpleural reticulation/fibrosis with interlobular septal thickening, right greater than left. 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 See comments above. Fluid along the pancreatic body/ tail, related to known acute pancreatitis, incompletely visualized. Electronically Signed   By: Julian Hy M.D.   On: 10/03/2015 21:22   Ct Abdomen Pelvis W Contrast  09/29/2015  CLINICAL DATA:  Abdominal pain with nausea and vomiting EXAM: CT ABDOMEN AND PELVIS WITH CONTRAST TECHNIQUE: Multidetector CT imaging of the abdomen and pelvis was performed using the standard protocol following bolus administration of intravenous contrast. CONTRAST:  167mL ISOVUE-300 IOPAMIDOL (ISOVUE-300) INJECTION 61% COMPARISON:  09/23/2015 FINDINGS: Lower chest: Bilateral pleural effusions are noted right considerably greater than left increased from the prior exam. Right lower lobe consolidation is noted as well. The previously seen right cavitary lesion is obscured by the consolidation. Mild left basilar atelectasis is noted as well. Hepatobiliary: Scattered hypodensities are noted throughout the liver consistent with hepatic cysts. The gallbladder has been surgically removed. Pancreas: Pancreas is well visualized and demonstrates considerable peripancreatic fluid. A dominant cyst is noted adjacent to the head of the pancreas measuring 3.6 cm. The previous measurement included in adjacent smaller cystic lesion accounting for the larger measurement on the prior exam. Increasing pseudocysts are noted about the tail. Spleen: Within normal limits in  size and appearance. Adrenals/Urinary Tract: No masses identified. No evidence of hydronephrosis. Stomach/Bowel: Diverticular change is noted of the colon without evidence of diverticulitis. The appendix is not visualized consistent with prior surgical history. Vascular/Lymphatic: No pathologically enlarged lymph nodes. Aortoiliac calcifications are noted with mild ectasia of the distal abdominal aorta measuring 2.7 cm. Reproductive: The uterus has been surgically removed. No pelvic mass lesion is noted. Other: None. Musculoskeletal:  Degenerative changes of lumbar spine are seen. IMPRESSION: Increasing bilateral pleural effusions. Increased right lower lobe consolidation is noted obscuring the known cavitary mass lesion. Increase in the size and number of peripancreatic pseudocysts. Electronically Signed   By: Inez Catalina M.D.   On: 09/29/2015 12:38   Ct Abdomen Pelvis W Contrast  09/23/2015  CLINICAL DATA:  Lower abdominal pain radiating to epigastric area in mid back for 6 weeks. Nausea. EXAM: CT ABDOMEN AND PELVIS WITH CONTRAST TECHNIQUE: Multidetector CT imaging of the abdomen and pelvis was performed using the standard protocol following bolus administration of intravenous contrast. CONTRAST:  152mL ISOVUE-300 IOPAMIDOL (ISOVUE-300) INJECTION 61% COMPARISON:  No CT 06/10/2015.  PET CT 07/07/2015.  Ne. FINDINGS: Lower chest: 2.8 cm partially cavitary mass noted in the right lower lobe. An adjacent small right pleural effusion, new since prior studies. Heart is normal size. Hepatobiliary: Stable hypodensities in the liver, likely cysts. Prior cholecystectomy. No biliary ductal dilatation. Pancreas: Stranding noted around the pancreas with small fluid collections, compatible with acute pancreatitis and early formation of pseudocysts. The largest is in the region of the pancreatic body measuring up to 4.1 cm. Fluid and developing focal fluid collections extends into the left anterior para renal space. No focal  pancreatic abnormality. The pancreatic duct is mildly prominent, new since prior study. Spleen: No focal abnormality.  Normal size. Adrenals/Urinary Tract: No adrenal abnormality. No focal renal abnormality. No stones or hydronephrosis. Urinary bladder is unremarkable. Stomach/Bowel: Stomach, large and small bowel grossly unremarkable. Vascular/Lymphatic: Diffuse aortic and iliac calcifications. Mild ectasia of the distal  abdominal aorta measuring 2.3 cm. No aneurysm. No adenopathy. Reproductive: Prior hysterectomy.  No adnexal masses. Other: No free fluid or free air. Small bilateral inguinal hernias containing fat. Musculoskeletal: No acute bony abnormality or focal bone lesion. IMPRESSION: Right lower lobe cavitary lesion again noted. There is a new small right pleural effusion. Fluid an developing fluid collections around the pancreas compatible with acute pancreatitis and developing pseudocysts. Aortic atherosclerosis. Benign-appearing cysts in the liver. Electronically Signed   By: Rolm Baptise M.D.   On: 09/23/2015 10:02   Dg Chest Port 1 View  10/05/2015  CLINICAL DATA:  Shortness of breath, weakness, chronic pleural effusion EXAM: PORTABLE CHEST 1 VIEW COMPARISON:  10/05/2015 at 0850 hours FINDINGS: Moderate right and small left pleural effusions. Associated right lower lobe opacity, likely atelectasis. No pneumothorax. The heart is normal in size. IMPRESSION: Moderate right and small left pleural effusions. Electronically Signed   By: Julian Hy M.D.   On: 10/05/2015 21:42   Dg Chest Port 1 View  10/04/2015  CLINICAL DATA:  Right-sided hydropneumothorax EXAM: PORTABLE CHEST 1 VIEW COMPARISON:  Chest radiograph and chest CT October 03, 2015 FINDINGS: There is fluid and airspace consolidation in the right base. Pneumothorax is not evident on current examination. A smaller amount of fluid and atelectasis/consolidation is noted in the left base. Heart is upper normal in size with pulmonary vascularity  within normal limits. No adenopathy. Upper lung zones are clear except for minimal right upper lobe atelectasis which is stable. IMPRESSION: Bibasilar airspace consolidation and effusions, more pronounced on the right than on the left. The pneumothorax noted 1 day prior in the right basilar region is not apparent on this current portable examination. Lungs elsewhere clear except for minimal atelectasis in the right upper lobe which is stable. Stable cardiac silhouette. Electronically Signed   By: Lowella Grip III M.D.   On: 10/04/2015 07:16   Dg Chest Port 1 View  09/23/2015  CLINICAL DATA:  Shortness of breath with fatigue and weakness EXAM: PORTABLE CHEST 1 VIEW COMPARISON:  July 17, 2015 chest radiograph and chest CT June 26, 2015 FINDINGS: The ill-defined mass in the right lower lobe is slightly less well seen compared to recent prior study. In particular, the cavitation in this area is not appreciable on current radiographic examination. There is scarring in the right base. There is new slight left base atelectasis laterally. Elsewhere lungs are clear. Heart size and pulmonary vascularity are normal. No adenopathy. No bone lesions. IMPRESSION: Previously noted mass in the right lower lobe is less well seen compared to prior studies. There is scarring in the right base. There is new atelectasis in the lateral left base. Lungs elsewhere clear. Stable cardiac silhouette. Electronically Signed   By: Lowella Grip III M.D.   On: 09/23/2015 09:09   US Thoracentesis Asp Pleural Space W/img Guide  10/02/2015  INDICATION: Patient with recurrent right pleural effusion. Request is made for repeat diagnostic and therapeutic thoracentesis. EXAM: ULTRASOUND GUIDED DIAGNOSTIC AND THERAPEUTIC THORACENTESIS MEDICATIONS: 1% lidocaine with epinephrine. COMPLICATIONS: None immediate. PROCEDURE: An ultrasound guided thoracentesis was thoroughly discussed with the patient and questions answered. The benefits, risks,  alternatives and complications were also discussed. The patient understands and wishes to proceed with the procedure. Written consent was obtained. Ultrasound was performed to localize and mark an adequate pocket of fluid in the right chest. The area was then prepped and draped in the normal sterile fashion. 1% Lidocaine with epi was used for local anesthesia. Under ultrasound  guidance a Safe-T-Centesis catheter was introduced. Thoracentesis was performed. The catheter was removed and a dressing applied. FINDINGS: A total of approximately 0.93 L of yellow fluid was removed. Samples were sent to the laboratory as requested by the clinical team. IMPRESSION: Successful ultrasound guided right thoracentesis yielding 0.93 L of pleural fluid. Read by: Saverio Danker, PA-C Electronically Signed   By: Lucrezia Europe M.D.   On: 10/02/2015 16:06   US Thoracentesis Asp Pleural Space W/img Guide  09/29/2015  INDICATION: Patient with history of pancreatitis with pseudocyst formation, Wegener's granulomatosis, COPD, right pleural effusion. Request made for diagnostic and therapeutic right thoracentesis. EXAM: ULTRASOUND GUIDED DIAGNOSTIC AND THERAPEUTIC RIGHT THORACENTESIS MEDICATIONS: None. COMPLICATIONS: None immediate. PROCEDURE: An ultrasound guided thoracentesis was thoroughly discussed with the patient and questions answered. The benefits, risks, alternatives and complications were also discussed. The patient understands and wishes to proceed with the procedure. Written consent was obtained. Ultrasound was performed to localize and mark an adequate pocket of fluid in the right chest. The area was then prepped and draped in the normal sterile fashion. 1% Lidocaine was used for local anesthesia. Under ultrasound guidance a Safe-T-Centesis catheter was introduced. Thoracentesis was performed. The catheter was removed and a dressing applied. FINDINGS: A total of approximately 1.4 liters of yellow fluid was removed. Samples were  sent to the laboratory as requested by the clinical team. IMPRESSION: Successful ultrasound guided diagnostic and therapeutic right thoracentesis yielding 1.4 liters of pleural fluid. Read by: Rowe Robert, PA-C Electronically Signed   By: Corrie Mckusick D.O.   On: 09/29/2015 16:43    Micro Results     Recent Results (from the past 240 hour(s))  Urine culture     Status: Abnormal   Collection Time: 09/28/15  7:46 PM  Result Value Ref Range Status   Specimen Description URINE, RANDOM  Final   Special Requests NONE  Final   Culture MULTIPLE SPECIES PRESENT, SUGGEST RECOLLECTION (A)  Final   Report Status 09/30/2015 FINAL  Final  Culture, Urine     Status: Abnormal   Collection Time: 09/29/15 11:48 AM  Result Value Ref Range Status   Specimen Description URINE, CLEAN CATCH  Final   Special Requests Normal  Final   Culture MULTIPLE SPECIES PRESENT, SUGGEST RECOLLECTION (A)  Final   Report Status 09/30/2015 FINAL  Final  Culture, body fluid-bottle     Status: None   Collection Time: 09/29/15  4:48 PM  Result Value Ref Range Status   Specimen Description FLUID RIGHT PLEURAL  Final   Special Requests BOTTLES DRAWN AEROBIC AND ANAEROBIC 10CC  Final   Culture   Final    NO GROWTH 5 DAYS Performed at [facility]    Report Status 10/04/2015 FINAL  Final  Gram stain     Status: None   Collection Time: 09/29/15  4:48 PM  Result Value Ref Range Status   Specimen Description FLUID RIGHT PLEURAL  Final   Special Requests NONE  Final   Gram Stain   Final    MODERATE WBC PRESENT,BOTH PMN AND MONONUCLEAR NO ORGANISMS SEEN Performed at [facility]    Report Status 09/29/2015 FINAL  Final  Culture, body fluid-bottle     Status: None (Preliminary result)   Collection Time: 10/02/15  3:54 PM  Result Value Ref Range Status   Specimen Description FLUID PLEURAL  Final   Special Requests NONE  Final   Culture   Final    NO GROWTH 4 DAYS Performed at  [facility]     Report Status PENDING  Incomplete  Gram stain     Status: None   Collection Time: 10/02/15  3:54 PM  Result Value Ref Range Status   Specimen Description FLUID PLEURAL  Final   Special Requests NONE  Final   Gram Stain   Final    MODERATE WBC PRESENT,BOTH PMN AND MONONUCLEAR NO ORGANISMS SEEN Performed at [facility]    Report Status 10/02/2015 FINAL  Final       Today   Subjective    [name] [name] is feeling better, her dyspnea is stable, her abdominal pain is improved, patient has been able to take by mouth. No vomiting. Patient complains generalized weakness.  Objective   Blood pressure 122/80, pulse 110, temperature 99 F (37.2 C), temperature source Oral, resp. rate 18, height 5\' 3"  (1.6 m), weight 69.5 kg (153 lb 3.5 oz), SpO2 96 %.   Intake/Output Summary (Last 24 hours) at 10/07/15 1238 Last data filed at 10/07/15 0841  Gross per 24 hour  Intake    520 ml  Output   1900 ml  Net  -1380 ml    Exam Gen. awake and alert Lungs had decreased breath sounds at the right base, no wheezing, rales, rhonchi. Heart S1-S2 present rhythmic, no gallops, rubs or murmurs Abdomen soft nontender Lower extremity is no edema Neuro Exam nonfocal Skin no rashes   Data Review   CBC w Diff: Lab Results  Component Value Date   WBC 10.6* 10/07/2015   WBC 7.9 06/16/2015   HGB 10.0* 10/07/2015   HGB 13.8 06/16/2015   HCT 30.4* 10/07/2015   HCT 40.3 06/16/2015   PLT 219 10/07/2015   PLT 234 06/16/2015   LYMPHOPCT 12 10/07/2015   LYMPHOPCT 18.0 06/16/2015   MONOPCT 13 10/07/2015   MONOPCT 9.1 06/16/2015   EOSPCT 1 10/07/2015   EOSPCT 1.3 06/16/2015   BASOPCT 0 10/07/2015   BASOPCT 0.3 06/16/2015    CMP: Lab Results  Component Value Date   NA 137 10/07/2015   NA 139 06/08/2013   K 2.9* 10/07/2015   K 3.1* 06/08/2013   CL 103 10/07/2015   CL 105 06/08/2012   CO2 27 10/07/2015   CO2 26 06/08/2013   BUN 8 10/07/2015   BUN 13.8 06/08/2013   CREATININE 0.47  10/07/2015   CREATININE 1.0 06/08/2013   PROT 5.3* 10/05/2015   PROT 6.7 06/08/2013   ALBUMIN 2.3* 10/05/2015   ALBUMIN 3.9 06/08/2013   BILITOT 0.6 10/05/2015   BILITOT 0.40 06/08/2013   ALKPHOS 100 10/05/2015   ALKPHOS 84 06/08/2013   AST 18 10/05/2015   AST 12 06/08/2013   ALT 24 10/05/2015   ALT 15 06/08/2013  .   Total Time in preparing paper work, data evaluation and todays exam - 45 minutes  Tawni Millers M.D on 10/07/2015 at 12:38 PM  [facility]   Office  [phone]

## 2015-10-07 NOTE — Progress Notes (Signed)
Patient given discharge, follow up, and medication instructions, IV removed, prescription and personal belongings with pt's family member, family to transport home

## 2015-10-08 ENCOUNTER — Other Ambulatory Visit: Payer: Self-pay | Admitting: *Deleted

## 2015-10-08 ENCOUNTER — Telehealth: Payer: Self-pay | Admitting: *Deleted

## 2015-10-08 LAB — POTASSIUM: Potassium: 3.6 mmol/L (ref 3.5–5.1)

## 2015-10-08 NOTE — Patient Outreach (Signed)
[facility]) Care Management  10/09/2015  Ra Clendaniel May 02, 1945 [id number]   RNCM placed call to patient as part of the transition of care program, [place] identifier information verified.Explained the purpose of the call.  Mrs.Marcinek reports that she is still having some discomfort in her stomach, able to eat small amounts of soft foods such as yogurts. She reports that she has taken pain medications that has given her some relief. Patient denies vomiting or need for nausea medication.but has it if needed.  Patient denies increased shortness of breath or cough. Mrs.Ursua reports that she normally lives alone but she has someone staying with her during the day and her daughters stay at night. Mrs.Taketa passed the phone to her daughter Marolyn Hammock for review of her medication . Patient was recently discharged from hospital and all medications have been reviewed.  Mrs.Sebesta states she does have her oxygen,  nebulizer but not the albuterol for machine.  [facility] and anticipates home visit on 7/13 or 7/14 per patient .  Mrs.Fedele reports she has all of her medications and denies cost concerns at this time.   Mrs.Amadon has post hospital office visit with PCP on 7/17, family to provide transportation.   Plan Patient will remain active with transition of care program and receive weekly outreaches, will schedule weekly transition of care call in the next week,  Will follow up with patient prescription at pharmacy and update patient .  [facility] CM Care Plan Problem One        Most Recent Value   Care Plan Problem One  High Risk for Readmission to hospital   Role Documenting the Problem One  Care Management [place] for Problem One  Active   [facility] Long Term Goal (31-90 days)  Patient will not experience a hospital admission in the next 31 days   [facility] Long Term Goal Start Date  10/08/15   Interventions for Problem One Long Term  Goal  Educated on the transition of care program, provided my contact information, [facility]  contact, reviewed patiient will recieve weekly outreach calls    [facility] CM Short Term Goal #1 (0-30 days)  Patient will report increased knowledge of symptoms of concern to notify MD of in the next 30 days   [facility] CM Short Term Goal #1 Start Date  10/08/15   Interventions for Short Term Goal #1  Discussed with patient to noity MD of increase in  nausea, vomiting, pain, shortness of breath or any new symptoms of concern , to address concerns sooner and arrange ofice visit.      Joylene Draft, RN, [place] Management [phone]- Mobile [phone]- Toll Free Main Office

## 2015-10-08 NOTE — Telephone Encounter (Signed)
Transition Care Management Follow-up Telephone Call   Date discharged? 10/07/15   How have you been since you were released from the hospital? Pt states she seem to be doing alright   Do you understand why you were in the hospital? YES   Do you understand the discharge instructions? YES   Where were you discharged to? Home   Items Reviewed:  Medications reviewed: YES  Allergies reviewed: YES  Dietary changes reviewed: NO  Referrals reviewed: NO   Functional Questionnaire:   Activities of Daily Living (ADLs):   She states she are independent in the following: ambulation, bathing and hygiene, feeding, continence, grooming, toileting and dressing States she doesn't require assistance   Any transportation issues/concerns?: NO   Any patient concerns? NO   Confirmed importance and date/time of follow-up visits scheduled: YES appt 10/25/2015  Provider Appointment booked with Dr. Quay Burow  Confirmed with patient if condition begins to worsen call PCP or go to the ER.  Patient was given the office number and encouraged to call back with question or concerns.  : YES

## 2015-10-09 ENCOUNTER — Telehealth: Payer: Self-pay

## 2015-10-09 ENCOUNTER — Encounter: Payer: Self-pay | Admitting: *Deleted

## 2015-10-09 ENCOUNTER — Other Ambulatory Visit: Payer: Self-pay | Admitting: *Deleted

## 2015-10-09 DIAGNOSIS — K859 Acute pancreatitis, unspecified: Secondary | ICD-10-CM

## 2015-10-09 DIAGNOSIS — J449 Chronic obstructive pulmonary disease, unspecified: Secondary | ICD-10-CM

## 2015-10-09 DIAGNOSIS — F4323 Adjustment disorder with mixed anxiety and depressed mood: Secondary | ICD-10-CM

## 2015-10-09 DIAGNOSIS — M3130 Wegener's granulomatosis without renal involvement: Secondary | ICD-10-CM

## 2015-10-09 DIAGNOSIS — J441 Chronic obstructive pulmonary disease with (acute) exacerbation: Secondary | ICD-10-CM

## 2015-10-09 MED ORDER — ALBUTEROL SULFATE (2.5 MG/3ML) 0.083% IN NEBU: 2.5000 mg | INHALATION_SOLUTION | Freq: Four times a day (QID) | RESPIRATORY_TRACT | Status: AC | PRN

## 2015-10-09 NOTE — Patient Outreach (Addendum)
[facility]) Care Management  10/09/2015  [name] [name] [birth year] [id number]   Care Coordination call  Placed call to [facility] to verify whether they had prescription for albuterol current or on file, they state not prescription new or on file.  Verified with [name] that [facility] [place] [facility], patient states she will not have money to pay for prescription, states she can't afford it ,its expensive, that is why she didn't get it in the past.Patient states she knows how to use nebulizer due to past history of someone staying with her in the past that used a nebulizer. Patient denies shortness of breath on today, using her oxygen at 2 liters nasal cannula   Call placed to Dr.Burns office to notify of need for prescription for albuterol  5PM Patient has been able to pick up her albuterol for nebulizer  from pharmacy at a cost of $7.00  Plan Will place [facility] pharmacy consult,  And discuss concern with [facility] pharmacist ,due to patient concern related to cost of medication. Will follow up with patient to update as needed.  Joylene Draft, RN, [place] [facility] [phone]- Mobile [phone]- Toll Free Main Office

## 2015-10-09 NOTE — Telephone Encounter (Signed)
Patient was D/C from hospital and patient does not have a rx for albuterol, she has everything else she needs. The home nurse called to informed.

## 2015-10-09 NOTE — Telephone Encounter (Signed)
RX sent. Pt notified

## 2015-10-12 NOTE — Patient Instructions (Addendum)
  Test(s) ordered today. Your results will be released to [place] (or called to you) after review, usually within 72hours after test completion. If any changes need to be made, you will be notified at that same time.   Medications reviewed and updated.  Changes include increasing your tramadol dose to 1-2 pills every 8 hours as needed.  Take a stool softener regularly if you start to develop constipation.   Your prescription(s) have been submitted to your pharmacy. Please take as directed and contact our office if you believe you are having problem(s) with the medication(s).

## 2015-10-12 NOTE — Progress Notes (Signed)
Subjective:    Patient ID: [name] [name], female    DOB: 07/29/1945, 70 y.o.   MRN: [id number]  HPI She is here for a hospital follow up.   She was admitted 6/27 and discharged 7/1.  She was diagnosed with GPA/Wegener's earlier this year and is following with pulmonary and rheumatology.  She has been in high dose oral steroids, bactrim and Rituxan.  Her steroids have been tapered and she was just about to stop.  She  Developed sharp epigastric pain radiating to her pain.  It is constant and severe at times.  Eating makes the pain worse and she has decreased appetite, nausea and intermittent vomiting.  She was found to have elevated lipase and a Ct of the abdomen showed pancreatitis with pseudocyst. The pancreatitis was felt to be a result of prednisone and bactrim.  Her pain improved and lipase trended down.  GI was consulted and no intervention was thought to be necessary.    She was readmitted 7/2 and discharged 7/11.  She presented to the hospital with abdominal pain, sob and dysuria.  Her urine showed an infection.  Her chest xray showed worsening pleural effusions.  She had a thoracentesis and it was an exudate.  She had to have a repeat thoracentesis.  She developed a small pneumothorax, but remained stable.  She does have cavitary lesions in the lungs thought to be related to the [facility].  Malignancy can not be ruled out and a CT scan needs to be done as an outpatient.  She does follow with pulmonary.  She went home with oxygen.  She continued to receive conservative care for her pancreatitis.  Her diet was advanced.  Her pseudocyst remained stable.  She was treated for trichomonas with metronidazole.    Since discharge she has increased back and abdominal pain from her effusion and pancreatitis.  It got worse again last night.  Yesterday during the day she felt better.  She feels short of breath.  She has been sedentary since being home from the hospital.  She has DOE with walking to the  bathroom even with the oxygen.  She is eating small amounts or bland food.  Her appetite is low.   She denies any fevers.  She does have a cough.  She has not had wheezing.  She feels nauseous.    COPD:  She has only been on oxygen since being discharged from the hospital.  She is using her dulera inhaler and she thinks it does help.  She needs a refill today.    Hypertension: She is taking her medication daily. She does not monitor her blood pressure at home.   She is drinking some fluids, but not sure how much.    Medications and allergies reviewed with patient and updated if appropriate.  Patient Active Problem List   Diagnosis Date Noted  . Pleural effusion, right 10/03/2015  . Cavitating mass in right lower lung lobe 10/03/2015  . Wegener's granulomatosis (granulomatosis with polyangiitis) ([place]) 10/03/2015  . Acute on chronic respiratory failure with hypoxia ([place]) 09/28/2015  . Bilateral pleural effusion 09/28/2015  . UTI (lower urinary tract infection) 09/28/2015  . Pancreatitis 09/23/2015  . Pancreatic pseudocyst 09/23/2015  . Back pain 08/08/2015  . Wegener's granulomatosis with renal involvement ([place]) 07/30/2015  . Lung mass 06/25/2015  . Tobacco user 06/25/2015  . Other mixed anxiety disorders 06/16/2015  . Osteopenia 10/07/2014  . Esophageal dysphagia 09/24/2014  . Hx of adenomatous polyp of colon  07/25/2014  . 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 Benign head tremor 05/07/2014  . 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 COPD (chronic obstructive pulmonary disease) with emphysema ([place])   . Hypokalemia   . Hyperglycemia   . Depression 01/29/2012  . Dyslipidemia 08/04/2011  . CAD (coronary artery disease) 08/04/2011  . History of tobacco abuse 08/04/2011  . Grief reaction   . Asthma 05/21/2010  . Allergic rhinitis 05/17/2010  . Hereditary hemochromatosis ([place]) 05/15/2010  . Essential hypertension 05/15/2010  . GERD  05/15/2010    Current Outpatient Prescriptions on File Prior to Visit  Medication Sig Dispense Refill  . albuterol (PROVENTIL) (2.5 MG/3ML) 0.083% nebulizer solution Take 3 mLs (2.5 mg total) by nebulization every 6 (six) hours as needed for wheezing or shortness of breath. 75 mL 2  . aspirin 81 MG tablet Take 362 mg by mouth once.    . budesonide-formoterol (SYMBICORT) 160-4.5 MCG/ACT inhaler Inhale 2 puffs into the lungs 2 (two) times daily. (Patient taking differently: Inhale 2 puffs into the lungs 2 (two) times daily as needed (For shortness of breath.). ) 1 Inhaler 0  . diazepam (VALIUM) 5 MG tablet Take 0.5-1 tablets (2.5-5 mg total) by mouth every 12 (twelve) hours as needed for anxiety. 60 tablet 0  . fexofenadine (ALLEGRA) 180 MG tablet Take 180 mg by mouth at bedtime.     Marland Kitchen loperamide (IMODIUM) 2 MG capsule Take 2 mg by mouth as needed for diarrhea or loose stools.    . Melatonin 1 MG TABS Take 1 mg by mouth at bedtime.    . metoprolol succinate (TOPROL XL) 25 MG 24 hr tablet Take 1 tablet (25 mg total) by mouth at bedtime. 30 tablet 6  . metoprolol succinate (TOPROL-XL) 50 MG 24 hr tablet Take 1 tablet (50 mg total) by mouth daily. Take with or immediately following a meal. 90 tablet 3  . omeprazole (PRILOSEC) 20 MG capsule Take 1 capsule (20 mg total) by mouth 2 (two) times daily before a meal.    . ondansetron (ZOFRAN ODT) 8 MG disintegrating tablet Take 1 tablet (8 mg total) by mouth every 8 (eight) hours as needed for nausea or vomiting. 20 tablet 0  . oxyCODONE (OXY IR/ROXICODONE) 5 MG immediate release tablet Take 1 tablet (5 mg total) by mouth every 6 (six) hours as needed for severe pain. 20 tablet 0  . potassium chloride (K-DUR) 10 MEQ tablet Take 1 tablet (10 mEq total) by mouth daily. 3 tablet 0  . simethicone (MYLICON) 0000000 MG chewable tablet Chew 125 mg by mouth every 6 (six) hours as needed for flatulence.    . traMADol (ULTRAM) 50 MG tablet Take 1 tablet (50 mg total) by  mouth every 8 (eight) hours as needed for severe pain. 10 tablet 0   No current facility-administered medications on file prior to visit.    Past Medical History  Diagnosis Date  . Hereditary hemochromatosis ([place]) 2004  . ASTHMA   . HYPERTENSION   . GERD   . Chronic kidney disease   . Arthritis     left shoulder   . Headache(784.0)     sinus headaches   . H/O hiatal hernia   . Anxiety   . Hyperlipidemia   . CAD (coronary artery disease)   . Depression   . Hypertension   . Urinary tract infection   . COPD (chronic obstructive pulmonary disease) with emphysema ([facility])     mild dz on  PFTs 11/2013  . 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 Hx of adenomatous polyp of colon 07/25/2014  . Osteopenia 10/07/2014    DEXA @ [facility] 09/2014: -1.9 L fem  . Other mixed anxiety disorders 06/16/2015  . 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 PONV (postoperative nausea and vomiting)     Past Surgical History  Procedure Laterality Date  . Cholecystectomy  03/29/09  . Appendectomy  03/29/81  . Abdominal hysterectomy  03/29/81  . Bladder tack  03/29/2002  . Other surgical history      cyst removed from intestines to ovary    . Other surgical history      tendon surgery left wrist   . Cystoscopy  01/24/14    NEGATIVE;Dr Ottelin  . Colonoscopy w/ biopsies    . Esophagogastroduodenoscopy      Social History   Social History  . Marital Status: Widowed    Spouse Name: N/A  . Number of Children: 2  . Years of Education: N/A   Occupational History  . 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    Social History Main Topics  . Smoking status: Current Every Day Smoker -- 0.33 packs/day for 50 years    Types: Cigarettes    Last Attempt to Quit: 04/30/2014  . Smokeless tobacco: Never Used  . Alcohol Use: No  . Drug Use: No  . Sexual Activity: No   Other Topics Concern  .  None   Social History Narrative   widowed summer 2012, lives alone. Retired from work in [employer] History  Problem Relation Age of Onset  . Hypertension Mother   . Heart disease Mother   . Breast cancer Mother 35  . Diabetes Father   . Heart disease Father   . Alcohol abuse Other   . Arthritis Other   . Cirrhosis Brother   . Cirrhosis Brother   . Cirrhosis Brother   . Lung cancer Brother   . Colon cancer Neg Hx   . Colon polyps Neg Hx   . Esophageal cancer Neg Hx   . Rectal cancer Neg Hx   . Stomach cancer Neg Hx     Review of Systems  Constitutional: Negative for fever and appetite change.  Respiratory: Positive for cough (few days, dry) and shortness of breath. Negative for wheezing.   Cardiovascular: Positive for palpitations and leg swelling (feet). Negative for chest pain.  Gastrointestinal: Positive for nausea and abdominal pain. Negative for vomiting, diarrhea and constipation.  Genitourinary: Negative for dysuria and hematuria.  Neurological: Negative for dizziness, light-headedness and headaches.       Objective:   Filed Vitals:   09/30/2015 0824  BP: 132/82  Pulse: 121  Resp: 20   Filed Weights   10/21/2015 0824  Weight: 156 lb (70.761 kg)   Body mass index is 27.64 kg/(m^2).   Physical Exam  Constitutional: No distress.  In moderate pain  HENT:  Head: Normocephalic and atraumatic.  Eyes: Conjunctivae are normal.  Neck: Neck supple. No tracheal deviation present. No thyromegaly present.  Cardiovascular: Regular rhythm and normal heart sounds.   No murmur heard. Tachycardic  Pulmonary/Chest: Effort normal. No respiratory distress. She has no wheezes. She has no rales. She exhibits tenderness (right posterior lower chest).  Decreased BS right base  Abdominal: Soft. She exhibits no distension. There is tenderness (across upper abdomen).  There is guarding. There is no rebound.  Musculoskeletal: She exhibits edema (b/l feet).    Lymphadenopathy:    She has no cervical adenopathy.  Skin: Skin is warm and dry. She is not diaphoretic.          Assessment & Plan:   See Problem List for Assessment and Plan of chronic medical problems.

## 2015-10-13 ENCOUNTER — Encounter: Payer: Self-pay | Admitting: Acute Care

## 2015-10-13 ENCOUNTER — Ambulatory Visit (INDEPENDENT_AMBULATORY_CARE_PROVIDER_SITE_OTHER): Payer: Medicare Other | Admitting: Acute Care

## 2015-10-13 ENCOUNTER — Encounter (HOSPITAL_COMMUNITY): Payer: Self-pay | Admitting: General Practice

## 2015-10-13 ENCOUNTER — Encounter: Payer: Self-pay | Admitting: Internal Medicine

## 2015-10-13 ENCOUNTER — Inpatient Hospital Stay (HOSPITAL_COMMUNITY)
Admission: AD | Admit: 2015-10-13 | Discharge: 2015-11-28 | DRG: 163 | Disposition: E | Payer: Medicare Other | Source: Ambulatory Visit | Attending: Internal Medicine | Admitting: Internal Medicine

## 2015-10-13 ENCOUNTER — Other Ambulatory Visit (INDEPENDENT_AMBULATORY_CARE_PROVIDER_SITE_OTHER): Payer: Medicare Other

## 2015-10-13 ENCOUNTER — Ambulatory Visit (INDEPENDENT_AMBULATORY_CARE_PROVIDER_SITE_OTHER)
Admission: RE | Admit: 2015-10-13 | Discharge: 2015-10-13 | Disposition: A | Discharge status: Home / Self Care | Payer: Medicare Other | Source: Ambulatory Visit | Attending: Internal Medicine | Admitting: Internal Medicine

## 2015-10-13 ENCOUNTER — Ambulatory Visit (INDEPENDENT_AMBULATORY_CARE_PROVIDER_SITE_OTHER): Payer: Medicare Other | Admitting: Internal Medicine

## 2015-10-13 VITALS — BP 112/72 | HR 112 | Ht 63.0 in | Wt 146.0 lb

## 2015-10-13 DIAGNOSIS — R000 Tachycardia, unspecified: Secondary | ICD-10-CM | POA: Diagnosis not present

## 2015-10-13 DIAGNOSIS — Z87891 Personal history of nicotine dependence: Secondary | ICD-10-CM

## 2015-10-13 DIAGNOSIS — I10 Essential (primary) hypertension: Secondary | ICD-10-CM

## 2015-10-13 DIAGNOSIS — A5002 Early congenital syphilitic osteochondropathy: Secondary | ICD-10-CM | POA: Diagnosis not present

## 2015-10-13 DIAGNOSIS — I2510 Atherosclerotic heart disease of native coronary artery without angina pectoris: Secondary | ICD-10-CM | POA: Diagnosis present

## 2015-10-13 DIAGNOSIS — C801 Malignant (primary) neoplasm, unspecified: Secondary | ICD-10-CM | POA: Diagnosis present

## 2015-10-13 DIAGNOSIS — D638 Anemia in other chronic diseases classified elsewhere: Secondary | ICD-10-CM | POA: Diagnosis present

## 2015-10-13 DIAGNOSIS — Z66 Do not resuscitate: Secondary | ICD-10-CM | POA: Diagnosis not present

## 2015-10-13 DIAGNOSIS — J948 Other specified pleural conditions: Secondary | ICD-10-CM

## 2015-10-13 DIAGNOSIS — K219 Gastro-esophageal reflux disease without esophagitis: Secondary | ICD-10-CM

## 2015-10-13 DIAGNOSIS — Z79899 Other long term (current) drug therapy: Secondary | ICD-10-CM

## 2015-10-13 DIAGNOSIS — K858 Other acute pancreatitis without necrosis or infection: Secondary | ICD-10-CM

## 2015-10-13 DIAGNOSIS — N390 Urinary tract infection, site not specified: Secondary | ICD-10-CM | POA: Diagnosis not present

## 2015-10-13 DIAGNOSIS — F419 Anxiety disorder, unspecified: Secondary | ICD-10-CM | POA: Diagnosis not present

## 2015-10-13 DIAGNOSIS — G4700 Insomnia, unspecified: Secondary | ICD-10-CM | POA: Diagnosis present

## 2015-10-13 DIAGNOSIS — Z7982 Long term (current) use of aspirin: Secondary | ICD-10-CM

## 2015-10-13 DIAGNOSIS — E46 Unspecified protein-calorie malnutrition: Secondary | ICD-10-CM | POA: Diagnosis present

## 2015-10-13 DIAGNOSIS — R739 Hyperglycemia, unspecified: Secondary | ICD-10-CM | POA: Diagnosis not present

## 2015-10-13 DIAGNOSIS — J918 Pleural effusion in other conditions classified elsewhere: Secondary | ICD-10-CM

## 2015-10-13 DIAGNOSIS — R1012 Left upper quadrant pain: Secondary | ICD-10-CM | POA: Diagnosis present

## 2015-10-13 DIAGNOSIS — Z881 Allergy status to other antibiotic agents status: Secondary | ICD-10-CM

## 2015-10-13 DIAGNOSIS — M3130 Wegener's granulomatosis without renal involvement: Secondary | ICD-10-CM

## 2015-10-13 DIAGNOSIS — R42 Dizziness and giddiness: Secondary | ICD-10-CM | POA: Diagnosis not present

## 2015-10-13 DIAGNOSIS — D72829 Elevated white blood cell count, unspecified: Secondary | ICD-10-CM | POA: Diagnosis not present

## 2015-10-13 DIAGNOSIS — Z789 Other specified health status: Secondary | ICD-10-CM | POA: Diagnosis not present

## 2015-10-13 DIAGNOSIS — K861 Other chronic pancreatitis: Secondary | ICD-10-CM | POA: Diagnosis present

## 2015-10-13 DIAGNOSIS — R911 Solitary pulmonary nodule: Secondary | ICD-10-CM | POA: Diagnosis not present

## 2015-10-13 DIAGNOSIS — J449 Chronic obstructive pulmonary disease, unspecified: Secondary | ICD-10-CM | POA: Diagnosis present

## 2015-10-13 DIAGNOSIS — C7801 Secondary malignant neoplasm of right lung: Secondary | ICD-10-CM | POA: Diagnosis present

## 2015-10-13 DIAGNOSIS — E871 Hypo-osmolality and hyponatremia: Secondary | ICD-10-CM | POA: Diagnosis not present

## 2015-10-13 DIAGNOSIS — J9621 Acute and chronic respiratory failure with hypoxia: Secondary | ICD-10-CM | POA: Diagnosis present

## 2015-10-13 DIAGNOSIS — E440 Moderate protein-calorie malnutrition: Secondary | ICD-10-CM | POA: Diagnosis not present

## 2015-10-13 DIAGNOSIS — R0602 Shortness of breath: Secondary | ICD-10-CM | POA: Diagnosis not present

## 2015-10-13 DIAGNOSIS — R140 Abdominal distension (gaseous): Secondary | ICD-10-CM | POA: Diagnosis present

## 2015-10-13 DIAGNOSIS — K859 Acute pancreatitis without necrosis or infection, unspecified: Secondary | ICD-10-CM | POA: Diagnosis present

## 2015-10-13 DIAGNOSIS — K8689 Other specified diseases of pancreas: Secondary | ICD-10-CM

## 2015-10-13 DIAGNOSIS — R109 Unspecified abdominal pain: Secondary | ICD-10-CM

## 2015-10-13 DIAGNOSIS — J9382 Other air leak: Secondary | ICD-10-CM | POA: Diagnosis not present

## 2015-10-13 DIAGNOSIS — F329 Major depressive disorder, single episode, unspecified: Secondary | ICD-10-CM | POA: Diagnosis present

## 2015-10-13 DIAGNOSIS — J9601 Acute respiratory failure with hypoxia: Secondary | ICD-10-CM | POA: Diagnosis present

## 2015-10-13 DIAGNOSIS — E785 Hyperlipidemia, unspecified: Secondary | ICD-10-CM | POA: Diagnosis present

## 2015-10-13 DIAGNOSIS — Z515 Encounter for palliative care: Secondary | ICD-10-CM | POA: Diagnosis not present

## 2015-10-13 DIAGNOSIS — Z888 Allergy status to other drugs, medicaments and biological substances status: Secondary | ICD-10-CM

## 2015-10-13 DIAGNOSIS — E8770 Fluid overload, unspecified: Secondary | ICD-10-CM | POA: Diagnosis not present

## 2015-10-13 DIAGNOSIS — D801 Nonfamilial hypogammaglobulinemia: Secondary | ICD-10-CM | POA: Diagnosis not present

## 2015-10-13 DIAGNOSIS — D539 Nutritional anemia, unspecified: Secondary | ICD-10-CM | POA: Diagnosis present

## 2015-10-13 DIAGNOSIS — J90 Pleural effusion, not elsewhere classified: Secondary | ICD-10-CM

## 2015-10-13 DIAGNOSIS — Z833 Family history of diabetes mellitus: Secondary | ICD-10-CM

## 2015-10-13 DIAGNOSIS — Z4659 Encounter for fitting and adjustment of other gastrointestinal appliance and device: Secondary | ICD-10-CM

## 2015-10-13 DIAGNOSIS — J869 Pyothorax without fistula: Secondary | ICD-10-CM | POA: Diagnosis present

## 2015-10-13 DIAGNOSIS — C799 Secondary malignant neoplasm of unspecified site: Secondary | ICD-10-CM | POA: Diagnosis not present

## 2015-10-13 DIAGNOSIS — Z885 Allergy status to narcotic agent status: Secondary | ICD-10-CM

## 2015-10-13 DIAGNOSIS — J939 Pneumothorax, unspecified: Secondary | ICD-10-CM

## 2015-10-13 DIAGNOSIS — F418 Other specified anxiety disorders: Secondary | ICD-10-CM | POA: Diagnosis not present

## 2015-10-13 DIAGNOSIS — E876 Hypokalemia: Secondary | ICD-10-CM | POA: Diagnosis present

## 2015-10-13 DIAGNOSIS — F411 Generalized anxiety disorder: Secondary | ICD-10-CM | POA: Diagnosis present

## 2015-10-13 DIAGNOSIS — C7800 Secondary malignant neoplasm of unspecified lung: Secondary | ICD-10-CM | POA: Diagnosis not present

## 2015-10-13 DIAGNOSIS — R0902 Hypoxemia: Secondary | ICD-10-CM | POA: Diagnosis not present

## 2015-10-13 DIAGNOSIS — Z9689 Presence of other specified functional implants: Secondary | ICD-10-CM

## 2015-10-13 DIAGNOSIS — Z8249 Family history of ischemic heart disease and other diseases of the circulatory system: Secondary | ICD-10-CM

## 2015-10-13 DIAGNOSIS — Z9049 Acquired absence of other specified parts of digestive tract: Secondary | ICD-10-CM

## 2015-10-13 DIAGNOSIS — R0600 Dyspnea, unspecified: Secondary | ICD-10-CM | POA: Diagnosis present

## 2015-10-13 DIAGNOSIS — I776 Arteritis, unspecified: Secondary | ICD-10-CM

## 2015-10-13 DIAGNOSIS — J9690 Respiratory failure, unspecified, unspecified whether with hypoxia or hypercapnia: Secondary | ICD-10-CM

## 2015-10-13 HISTORY — DX: Headache, unspecified: R51.9

## 2015-10-13 HISTORY — DX: Migraine, unspecified, not intractable, without status migrainosus: G43.909

## 2015-10-13 HISTORY — DX: Headache: R51

## 2015-10-13 HISTORY — DX: Dependence on supplemental oxygen: Z99.81

## 2015-10-13 LAB — COMPREHENSIVE METABOLIC PANEL
ALBUMIN: 2.4 g/dL — AB (ref 3.5–5.0)
ALT: 16 U/L (ref 0–35)
ALT: 19 U/L (ref 14–54)
AST: 14 U/L (ref 0–37)
AST: 17 U/L (ref 15–41)
Albumin: 2.9 g/dL — ABNORMAL LOW (ref 3.5–5.2)
Alkaline Phosphatase: 86 U/L (ref 39–117)
Alkaline Phosphatase: 90 U/L (ref 38–126)
Anion gap: 7 (ref 5–15)
BUN: 15 mg/dL (ref 6–23)
BUN: 12 mg/dL (ref 6–20)
CALCIUM: 9.1 mg/dL (ref 8.4–10.5)
CALCIUM: 9 mg/dL (ref 8.9–10.3)
CHLORIDE: 97 meq/L (ref 96–112)
CO2: 33 mEq/L — ABNORMAL HIGH (ref 19–32)
CO2: 31 mmol/L (ref 22–32)
CREATININE: 0.51 mg/dL (ref 0.40–1.20)
CREATININE: 0.65 mg/dL (ref 0.44–1.00)
Chloride: 97 mmol/L — ABNORMAL LOW (ref 101–111)
GFR calc non Af Amer: >60 mL/min (ref >60)
GFR: 126.75 mL/min (ref >60.00)
Glucose, Bld: 120 mg/dL — ABNORMAL HIGH (ref 70–99)
Glucose, Bld: 109 mg/dL — ABNORMAL HIGH (ref 65–99)
Potassium: 3.4 mEq/L — ABNORMAL LOW (ref 3.5–5.1)
Potassium: 3.1 mmol/L — ABNORMAL LOW (ref 3.5–5.1)
SODIUM: 135 mmol/L (ref 135–145)
Sodium: 137 mEq/L (ref 135–145)
Total Bilirubin: 0.5 mg/dL (ref 0.2–1.2)
Total Bilirubin: 0.8 mg/dL (ref 0.3–1.2)
Total Protein: 5.6 g/dL — ABNORMAL LOW (ref 6.0–8.3)
Total Protein: 5.4 g/dL — ABNORMAL LOW (ref 6.5–8.1)

## 2015-10-13 LAB — CBC WITH DIFFERENTIAL/PLATELET
BASOS ABS: 0 10*3/uL (ref 0.0–0.1)
BASOS PCT: 0.3 % (ref 0.0–3.0)
Basophils Absolute: 0 10*3/uL (ref 0.0–0.1)
Basophils Relative: 0 %
EOS PCT: 1 % (ref 0.0–5.0)
EOS PCT: 1 %
Eosinophils Absolute: 0.1 10*3/uL (ref 0.0–0.7)
Eosinophils Absolute: 0.1 10*3/uL (ref 0.0–0.7)
HCT: 35.2 % — ABNORMAL LOW (ref 36.0–46.0)
HEMATOCRIT: 35.4 % — AB (ref 36.0–46.0)
Hemoglobin: 11.8 g/dL — ABNORMAL LOW (ref 12.0–15.0)
Hemoglobin: 11.4 g/dL — ABNORMAL LOW (ref 12.0–15.0)
LYMPHS ABS: 1.5 10*3/uL (ref 0.7–4.0)
LYMPHS PCT: 14 %
Lymphocytes Relative: 11.1 % — ABNORMAL LOW (ref 12.0–46.0)
Lymphs Abs: 2 10*3/uL (ref 0.7–4.0)
MCH: 31.8 pg (ref 26.0–34.0)
MCHC: 33.3 g/dL (ref 30.0–36.0)
MCHC: 32.4 g/dL (ref 30.0–36.0)
MCV: 95.1 fl (ref 78.0–100.0)
MCV: 98.3 fL (ref 78.0–100.0)
MONO ABS: 1.7 10*3/uL — AB (ref 0.1–1.0)
MONOS PCT: 5.6 % (ref 3.0–12.0)
Monocytes Absolute: 0.8 10*3/uL (ref 0.1–1.0)
Monocytes Relative: 13 %
NEUTROS ABS: 11.4 10*3/uL — AB (ref 1.4–7.7)
NEUTROS PCT: 82 % — AB (ref 43.0–77.0)
Neutro Abs: 10 10*3/uL — ABNORMAL HIGH (ref 1.7–7.7)
Neutrophils Relative %: 72 %
PLATELETS: 337 10*3/uL (ref 150.0–400.0)
PLATELETS: 313 10*3/uL (ref 150–400)
RBC: 3.73 Mil/uL — ABNORMAL LOW (ref 3.87–5.11)
RBC: 3.58 MIL/uL — ABNORMAL LOW (ref 3.87–5.11)
RDW: 16.2 % — AB (ref 11.5–15.5)
RDW: 16.2 % — AB (ref 11.5–15.5)
WBC: 13.9 10*3/uL — ABNORMAL HIGH (ref 4.0–10.5)
WBC: 13.8 10*3/uL — ABNORMAL HIGH (ref 4.0–10.5)

## 2015-10-13 LAB — MAGNESIUM: MAGNESIUM: 1.8 mg/dL (ref 1.7–2.4)

## 2015-10-13 LAB — GLUCOSE, POCT (MANUAL RESULT ENTRY): POC Glucose: 214 mg/dl — AB (ref 70–99)

## 2015-10-13 LAB — PROTIME-INR
INR: 1.13 (ref 0.00–1.49)
Prothrombin Time: 14.7 seconds (ref 11.6–15.2)

## 2015-10-13 LAB — LIPASE: LIPASE: 183 U/L — AB (ref 11.0–59.0)

## 2015-10-13 LAB — AMYLASE: AMYLASE: 101 U/L (ref 27–131)

## 2015-10-13 LAB — PHOSPHORUS: PHOSPHORUS: 3.5 mg/dL (ref 2.5–4.6)

## 2015-10-13 LAB — APTT: aPTT: 34 seconds (ref 24–37)

## 2015-10-13 LAB — HEMOGLOBIN A1C: HEMOGLOBIN A1C: 5.9 % (ref 4.6–6.5)

## 2015-10-13 MED ORDER — SODIUM CHLORIDE 0.9% FLUSH
3.0000 mL | Freq: Two times a day (BID) | INTRAVENOUS | Status: DC
  Administered 2015-10-13 – 2015-10-28 (×16): 3 mL via INTRAVENOUS

## 2015-10-13 MED ORDER — MOMETASONE FURO-FORMOTEROL FUM 200-5 MCG/ACT IN AERO: 2.0000 | INHALATION_SPRAY | Freq: Two times a day (BID) | RESPIRATORY_TRACT | Status: AC

## 2015-10-13 MED ORDER — ACETAMINOPHEN 650 MG RE SUPP: 650.0000 mg | Freq: Four times a day (QID) | RECTAL | Status: DC | PRN

## 2015-10-13 MED ORDER — ALBUTEROL SULFATE (2.5 MG/3ML) 0.083% IN NEBU
2.5000 mg | INHALATION_SOLUTION | Freq: Four times a day (QID) | RESPIRATORY_TRACT | Status: DC
  Administered 2015-10-13 – 2015-10-16 (×14): 2.5 mg via RESPIRATORY_TRACT
  Filled 2015-10-13 (×14): qty 3

## 2015-10-13 MED ORDER — ACETAMINOPHEN 325 MG PO TABS
650.0000 mg | ORAL_TABLET | Freq: Four times a day (QID) | ORAL | Status: DC | PRN
  Administered 2015-10-16 – 2015-10-28 (×8): 650 mg via ORAL
  Filled 2015-10-13 (×9): qty 2

## 2015-10-13 MED ORDER — ONDANSETRON HCL 4 MG PO TABS: 4.0000 mg | ORAL_TABLET | Freq: Four times a day (QID) | ORAL | Status: DC | PRN

## 2015-10-13 MED ORDER — DIAZEPAM 2 MG PO TABS
1.0000 mg | ORAL_TABLET | Freq: Two times a day (BID) | ORAL | Status: AC | PRN
  Administered 2015-10-13 – 2015-10-16 (×4): 1 mg via ORAL
  Filled 2015-10-13 (×5): qty 1

## 2015-10-13 MED ORDER — ASPIRIN EC 81 MG PO TBEC
81.0000 mg | DELAYED_RELEASE_TABLET | Freq: Every day | ORAL | Status: DC
  Administered 2015-10-13 – 2015-10-21 (×8): 81 mg via ORAL
  Filled 2015-10-13 (×8): qty 1

## 2015-10-13 MED ORDER — DIAZEPAM 5 MG PO TABS: 2.5000 mg | ORAL_TABLET | Freq: Two times a day (BID) | ORAL | Status: AC | PRN

## 2015-10-13 MED ORDER — SIMETHICONE 80 MG PO CHEW
80.0000 mg | CHEWABLE_TABLET | Freq: Four times a day (QID) | ORAL | Status: DC | PRN
  Administered 2015-10-18 (×2): 80 mg via ORAL
  Filled 2015-10-13 (×2): qty 1

## 2015-10-13 MED ORDER — SODIUM CHLORIDE 0.9% FLUSH
3.0000 mL | Freq: Two times a day (BID) | INTRAVENOUS | Status: DC
  Administered 2015-10-13 – 2015-11-03 (×28): 3 mL via INTRAVENOUS

## 2015-10-13 MED ORDER — FLUTICASONE FUROATE-VILANTEROL 100-25 MCG/INH IN AEPB
1.0000 | INHALATION_SPRAY | Freq: Every day | RESPIRATORY_TRACT | Status: DC
  Administered 2015-10-15 – 2015-10-26 (×8): 1 via RESPIRATORY_TRACT
  Filled 2015-10-13 (×3): qty 28

## 2015-10-13 MED ORDER — LORATADINE 10 MG PO TABS
10.0000 mg | ORAL_TABLET | Freq: Every day | ORAL | Status: DC
  Filled 2015-10-13 (×2): qty 1

## 2015-10-13 MED ORDER — HEPARIN SODIUM (PORCINE) 5000 UNIT/ML IJ SOLN
5000.0000 [IU] | Freq: Three times a day (TID) | INTRAMUSCULAR | Status: DC
  Administered 2015-10-13 – 2015-10-16 (×11): 5000 [IU] via SUBCUTANEOUS
  Filled 2015-10-13 (×11): qty 1

## 2015-10-13 MED ORDER — METOPROLOL SUCCINATE ER 25 MG PO TB24
25.0000 mg | ORAL_TABLET | Freq: Every day | ORAL | Status: DC
  Administered 2015-10-13 – 2015-10-18 (×6): 25 mg via ORAL
  Filled 2015-10-13 (×6): qty 1

## 2015-10-13 MED ORDER — ONDANSETRON HCL 4 MG/2ML IJ SOLN
4.0000 mg | Freq: Four times a day (QID) | INTRAMUSCULAR | Status: DC | PRN
  Administered 2015-10-15 – 2015-10-22 (×4): 4 mg via INTRAVENOUS
  Filled 2015-10-13 (×5): qty 2

## 2015-10-13 MED ORDER — FAMOTIDINE 20 MG PO TABS
20.0000 mg | ORAL_TABLET | Freq: Every day | ORAL | Status: DC
  Administered 2015-10-13 – 2015-10-28 (×15): 20 mg via ORAL
  Filled 2015-10-13 (×15): qty 1

## 2015-10-13 MED ORDER — TRAMADOL HCL 50 MG PO TABS
50.0000 mg | ORAL_TABLET | Freq: Three times a day (TID) | ORAL | Status: DC | PRN
  Administered 2015-10-13 – 2015-10-16 (×7): 50 mg via ORAL
  Filled 2015-10-13 (×6): qty 1

## 2015-10-13 MED ORDER — TRAMADOL HCL 50 MG PO TABS
50.0000 mg | ORAL_TABLET | Freq: Two times a day (BID) | ORAL | Status: DC | PRN
  Administered 2015-10-13: 50 mg via ORAL
  Filled 2015-10-13 (×2): qty 1

## 2015-10-13 MED ORDER — TRAMADOL HCL 50 MG PO TABS: 50.0000 mg | ORAL_TABLET | Freq: Three times a day (TID) | ORAL | Status: AC | PRN

## 2015-10-13 MED ORDER — POLYETHYLENE GLYCOL 3350 17 G PO PACK: 17.0000 g | PACK | Freq: Every day | ORAL | Status: DC | PRN

## 2015-10-13 MED ORDER — POTASSIUM CHLORIDE CRYS ER 10 MEQ PO TBCR
10.0000 meq | EXTENDED_RELEASE_TABLET | Freq: Every day | ORAL | Status: DC
  Administered 2015-10-14: 10 meq via ORAL
  Filled 2015-10-13 (×2): qty 1

## 2015-10-13 MED ORDER — MOMETASONE FURO-FORMOTEROL FUM 200-5 MCG/ACT IN AERO: 2.0000 | INHALATION_SPRAY | Freq: Two times a day (BID) | RESPIRATORY_TRACT | Status: DC

## 2015-10-13 MED ORDER — ENSURE ENLIVE PO LIQD
237.0000 mL | Freq: Two times a day (BID) | ORAL | Status: DC
  Administered 2015-10-14: 237 mL via ORAL

## 2015-10-13 MED ORDER — OMEPRAZOLE 20 MG PO CPDR: 20.0000 mg | DELAYED_RELEASE_CAPSULE | Freq: Two times a day (BID) | ORAL | Status: AC

## 2015-10-13 MED ORDER — METOPROLOL SUCCINATE ER 50 MG PO TB24
50.0000 mg | ORAL_TABLET | Freq: Every day | ORAL | Status: DC
  Administered 2015-10-14 – 2015-10-18 (×3): 50 mg via ORAL
  Filled 2015-10-13 (×5): qty 1

## 2015-10-13 NOTE — Progress Notes (Signed)
History of Present Illness [name] [name] is a 70 y.o. female former smoker ( Quit 08/2015) with Wegner's diagnosed 07/2015.    07/06/2017Acute OV: Pt. Returns for acute OV. She was discharged from the hospital 10/07/15 for pancreatitis with pseudocyst formation. This was her second admission for this same problem, first admission was 6/27-09/27/2015. Additionally she has had  a right exudative pleural effusion, thought to be reactive due to the pancreatitis,  that has been tapped twice, once on 09/29/2015, and 10/02/2015. Per  The patients daughter, they feel this is a result of prednisone treatment for Kirke Shaggy which was started in May of this year.. She states her dyspnea has gotten worse on the last 2-3 days.She has also noted that she has had increased abdominal and lower extremity swelling.CXR today shows re-accumulation of fluid in right lung. She is tearful in the office today, and does not feel she can wait a day or two for a consultation with TCTS for evaluation and treatment of her recurrent pleural effusion. She denies fever, chest pain, purulent secretions, hemoptysis, leg or calf pain. The patient gives. history of orthopnea. She is accompanied by her daughter.The decision was made to admit her to the hospital today for evaluation and treatment of her recurrent pleural effusion by TCTS.Marland Kitchen  Tests 10/20/2015 CXR  IMPRESSION: Interval increase in the volume of pleural fluid present on the right. Persistent right pleural space gas collections as well consistent with multiloculated hydropneumothoraces. Stable small left pleural effusion.  STUDIES:  7/07 CT Chest >> moderate R hydropneumothorax, abnormal pleural thickening with scattered areas of pleural fluid in the R hemithorax, additional subpleural reticulation / fibrosis with interlobular septal thickening R>L, fluid along the pancreatic body/tail related to known acute pancreatitis 7/03 Cytology from [facility] >> acute inflammation, no  malignant cells 7/07 Cytology from [facility] >> no malignant cells   CULTURES:  UC 7/2 >> recollection suggested UC 7/3 >> recollection suggested Pleural Fluid R 7/3 >> negative  Pleural Fluid R 7/6 >>  AUTOIMMUNE: ESR 7/7 >> 45 GBM 7/7 >>  ANCA 7/7 >> negative   Past medical hx Past Medical History  Diagnosis Date  . Hereditary hemochromatosis ([place]) 2004  . ASTHMA   . HYPERTENSION   . GERD   . Chronic kidney disease   . H/O hiatal hernia   . Anxiety   . Hyperlipidemia   . CAD (coronary artery disease)   . Depression   . Hypertension   . Urinary tract infection     "recurrent sometimes" (10/12/2015)  . COPD (chronic obstructive pulmonary disease) with emphysema ([place])     mild dz on PFTs 11/2013  . 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 Hx of adenomatous polyp of colon 07/25/2014  . Osteopenia 10/07/2014    DEXA @ [facility] 09/2014: -1.9 L fem  . 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 PONV (postoperative nausea and vomiting)   . On home oxygen therapy     "2L; 24/7" (10/27/2015)  . Sinus headache   . Migraine     "hx; not anymore" (10/21/2015)  . Arthritis     "all over me, I guess" (10/12/2015)     Past surgical hx, Family hx, Social hx all reviewed.  No current facility-administered medications on file prior to visit.   Current Outpatient Prescriptions on File Prior to Visit  Medication Sig  . albuterol (PROVENTIL) (2.5 MG/3ML) 0.083% nebulizer solution Take 3 mLs (2.5  mg total) by nebulization every 6 (six) hours as needed for wheezing or shortness of breath. (Patient taking differently: Take 2.5 mg by nebulization 2 (two) times daily as needed for wheezing or shortness of breath. )  . diazepam (VALIUM) 5 MG tablet Take 0.5-1 tablets (2.5-5 mg total) by mouth every 12 (twelve) hours as needed for anxiety. (Patient taking differently: Take 5 mg by mouth every 12 (twelve) hours  as needed for anxiety (sleep). )  . fexofenadine (ALLEGRA) 180 MG tablet Take 180 mg by mouth at bedtime.   . metoprolol succinate (TOPROL XL) 25 MG 24 hr tablet Take 1 tablet (25 mg total) by mouth at bedtime.  . metoprolol succinate (TOPROL-XL) 50 MG 24 hr tablet Take 1 tablet (50 mg total) by mouth daily. Take with or immediately following a meal.  . mometasone-formoterol (DULERA) 200-5 MCG/ACT AERO Inhale 2 puffs into the lungs 2 (two) times daily.  Marland Kitchen omeprazole (PRILOSEC) 20 MG capsule Take 1 capsule (20 mg total) by mouth 2 (two) times daily before a meal.  . ondansetron (ZOFRAN ODT) 8 MG disintegrating tablet Take 1 tablet (8 mg total) by mouth every 8 (eight) hours as needed for nausea or vomiting.  . potassium chloride (K-DUR) 10 MEQ tablet Take 1 tablet (10 mEq total) by mouth daily. (Patient not taking: Reported on 09/29/2015)  . simethicone (MYLICON) 150 MG chewable tablet Chew 125 mg by mouth 4 (four) times daily.   . traMADol (ULTRAM) 50 MG tablet Take 1-2 tablets (50-100 mg total) by mouth every 8 (eight) hours as needed for severe pain.     Allergies  Allergen Reactions  . Other Other (See Comments)    Please if giving pt anesthesia - she would like to have a scopolamine patch to prevent nausea   . Ciprofloxacin Hives and Other (See Comments)    Blisters on legs  . Codeine Nausea And Vomiting and Other (See Comments)    Sweating, "passes out"  . Macrobid [employer] Macro] Other (See Comments)    Stool incontinence  . Morphine And Related Other (See Comments)    "hospital bed was shaking"  . Prevnar [Pneumococcal 13-Val Conj Vacc] Palpitations and Other (See Comments)    Pt c/o redness, swelling on arm after shot.  Pt c/o having respiratory problems and coughing ever since she got it.     Review Of Systems:  Constitutional:   No  weight loss, night sweats,  Fevers, chills, fatigue, or  lassitude.  HEENT:   No headaches,  Difficulty swallowing,  Tooth/dental  problems, or  Sore throat,                No sneezing, itching, ear ache, nasal congestion, post nasal drip,   CV:  + chest pain,  +Orthopnea, PND, +swelling in lower extremities, anasarca, dizziness, palpitations, syncope.   GI  No heartburn, indigestion, +abdominal pain,+ nausea, vomiting, +diarrhea, change in bowel habits, +loss of appetite, no bloody stools.   Resp: + shortness of breath with exertion and  at rest.  No excess mucus, no productive cough,  + non-productive cough,  No coughing up of blood.  No change in color of mucus.  No wheezing.  No chest wall deformity  Skin: no rash or lesions.  GU: no dysuria, change in color of urine, no urgency or frequency.  No flank pain, no hematuria   MS:  No joint pain or swelling.  No decreased range of motion.  No back pain.  Psych:  No change in mood or affect. + depression and  anxiety.  No memory loss.   Vital Signs BP 112/72 mmHg  Pulse 112  Ht _0  (1.6 m)  Wt 146 lb (66.225 kg)  BMI 25.87 kg/m2  SpO2 95%   Physical Exam:  General- No distress,  A&Ox3, tearful and anxious in a wheel chair ENT: No sinus tenderness, TM clear, pale nasal mucosa, no oral exudate,no post nasal drip, no LAN Cardiac: S1, S2, regular rate and rhythm, no murmur Chest: No wheeze/ rales/ + dullness right lower lobe; no accessory muscle use, no nasal flaring, no sternal retractions Abd.: Soft Non-tender Ext: No clubbing cyanosis,1+  Edema bilateral lower extremities. Neuro:  MAE x 4, but deconditioned and weak. Skin: No rashes, warm and dry Psych: normal mood and behavior   Assessment/Plan  Pleural effusion associated with pancreatitis Interval enlargement of Right Exudative Pleural Effusion per CXR 10/01/2015 Thoracentesis x 2 09/29/2015, 10/02/2015 Suspect reactionary 2/2 slow to resolve pancreatitis. Worsening dyspnea x 2 days. Plan: Admit to [facility] telemetry bed. TCTS consult for evaluation and management of recurrent right pleural  effusion. Oxygen at 2L [place] continuously Maintain saturations >90% Meds per home regimen. See H&P in hospital Medical record dated 10/02/2015. Follow up with Dr. Corrie Dandy after discharge. Follow up imaging  of left pulmonary nodule at follow up when patient is more stable. Please contact office for sooner follow up if symptoms do not improve or worsen or seek emergency care       Magdalen Spatz, NP 09/29/2015  10:22 PM

## 2015-10-13 NOTE — Progress Notes (Signed)
Pre visit review using our clinic review tool, if applicable. No additional management support is needed unless otherwise documented below in the visit note. 

## 2015-10-13 NOTE — Assessment & Plan Note (Signed)
BP well controlled Current regimen effective and well tolerated Continue current medications at current doses  

## 2015-10-13 NOTE — Assessment & Plan Note (Signed)
Increased pain in abdomen and nausea - pancreatitis still active Increase fluids, bland diet zofran as needed Check amylase, lipase, cmp Increase tramadol as needed - stool softener prn

## 2015-10-13 NOTE — Patient Instructions (Addendum)
It is nice to meet you today. Continue wearing your oxygen at 2 L [place] as you have been doing. We have sent a request to the Thoracic Surgeon for referral and work up of your pleural effusion. We

## 2015-10-13 NOTE — Assessment & Plan Note (Addendum)
Interval enlargement of Right Exudative Pleural Effusion per CXR 10/12/2015 Thoracentesis x 2 09/29/2015, 10/02/2015 Suspect reactionary 2/2 slow to resolve pancreatitis. Worsening dyspnea x 2 days. Plan: Admit to [facility] telemetry bed. TCTS consult for evaluation and management of recurrent right pleural effusion. Oxygen at 2L [place] continuously Maintain saturations >90% Meds per home regimen. See H&P in hospital Medical record dated 10/25/2015. Follow up with Dr. Corrie Dandy after discharge. Follow up imaging  of left pulmonary nodule at follow up when patient is more stable. Please contact office for sooner follow up if symptoms do not improve or worsen or seek emergency care

## 2015-10-13 NOTE — Assessment & Plan Note (Signed)
Glucose here is elevated, but not fasting Check a1c

## 2015-10-13 NOTE — Assessment & Plan Note (Signed)
?   Worsening of effusion - increased SOB and cough, increased right posterior back pain cxr today - will consult with pulm if effusion is worse

## 2015-10-13 NOTE — H&P (Signed)
Name: [name] [name] MRN: [id number] DOB: 03/05/1946    ADMISSION DATE:  10/19/2015  CHIEF COMPLAINT:  Dyspnea  BRIEF PATIENT DESCRIPTION:  Elderly female in wheelchair wearing oxygen, anxious.  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 Persistent right pleural space gas collections as well consistent with multiloculated hydropneumothoraces. Stable small left pleural effusion. 09/16/2015: Echo>>Normal LV size with mild LV hypertrophy, EF 65-70%. Normal RV  size and systolic function. No significant valvular  abnormalities.   CULTURES:  UC 7/2 >> recollection suggested UC 7/3 >> recollection suggested Pleural Fluid R 7/3 >> negative  Pleural Fluid R 7/6 >>  AUTOIMMUNE: ESR 7/7 >> 45 GBM 7/7 >>  ANCA 7/7 >> negative   HISTORY OF PRESENT ILLNESS:    History is gained from review of the chart, talking to the patient and talking to her daughter. The patient's primary pulmonologist  is Dr. Gerrit Friends DeDios.   It appears in May 2016 she had a CT MG gram of the chest that did not show any pulmonary issues including infiltrates or pulmonary embolism. Then early in 2017 she had microscopic hematuria that was evaluated by urology with a CT abdomen mid March 2017 that  showed normal pancreas but had a 3.5 cm right lower lobe cavitating mass. This was followed up by a CT chest 06/26/2015 that confirm the 3.5 cm right lower lobe thick-walled cavitating mass along with a left adrenal adenoma. The mediastinum itself was normal. She was then referred to Dr. Gerrit Friends DeDios for evaluation. She underwent CT-guided transthoracic needle biopsy 07/17/2015 that in discussion with the pathologist showed vasculitic features. I'm not sure if there was granuloma or not. 07/24/2015 she underwent limited autoimmune workup that showed screening p-ANCA positive (no c-anca, pr3 or mpo avail). Then 08/06/15 she had full panel with Dr Trudie Reed and per phone call PR-3 and MPO antibody results were NEGATIVE and only P-ANCA was trace posiotive. 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 Patient then committed to high-dose prednisone and also Rituxan once a week for 4 weeks infusion. She completed 3 weeks of this infusion he did she was supposed to get her fourth dose a little 10/01/2015 on 09/24/2015 but she missed it because of illness.  She believes ever since starting high-dose prednisone she started developing abdominal pain. A CT abdomen 09/23/2015 showed the right lower lobe mass at 2.6 cm perhaps smaller but obscured by a right pleural effusion that was small. But she had acute pancreatitis features with small pseudocyst. She blames the high-dose prednisone for her current problems. She does not think Rituxan is the causative agent for this.   She reports she started developing abdominal pain and finally was admitted 09/28/2015. Since admission the right pleural effusion got worse and she had a thoracentesis 09/29/2015 that showed idiopathic exudate with acute inflammatory cells and mesothelial cells with an LDH of 400. Thoracentesis was  repeated 10/02/2015 with an LDH of 336. This was complicated by small right basilar pneumothorax on chest x-ray  10/03/2015 which  radiology is conservatively following. She was discharged from [place] on 10/07/15  Given her pulmonary problems PCCM consulted for evaluation at her last admission  She feels miserable because of a bowel pain and depressed and also has some Cushing features. She blames all her problems on prednisone. She presented to the pulmonary office today with complaints of worsening Dyspnea. CXR indicates worsening of the right exudative pleural effusion. As this has been tapped twice, we will plan a TCTS referral for further work up and treatment. She has developed some edema to her lower extremities which she states is new within the last week, and abdominal girth is concerning for Ascites.. She states that she does not take lasix due to the fact she has problems with hypokalemia in the past..  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  has past surgical history that includes Cholecystectomy (03/29/09); Appendectomy (03/29/81); Abdominal hysterectomy (03/29/81); Bladder tack (03/29/2002); Other surgical history; Other surgical history; Cystoscopy (01/24/14); Colonoscopy w/ biopsies; and Esophagogastroduodenoscopy. Prior to Admission medications   Medication Sig Start Date End Date Taking? Authorizing Provider  albuterol (PROVENTIL) (2.5 MG/3ML) 0.083% nebulizer solution Take 3 mLs  (2.5 mg total) by nebulization every 6 (six) hours as needed for wheezing or shortness of breath. 10/09/15   [name], MD  aspirin 81 MG tablet Take 362 mg by mouth once.    Historical Provider, MD  BREO ELLIPTA 100-25 MCG/INH AEPB INHALE 1 PUFF INTO THE LUNGS DAILY. 07/30/15   Historical Provider, MD  diazepam (VALIUM) 5 MG tablet Take 0.5-1 tablets (2.5-5 mg total) by mouth every 12 (twelve) hours as needed for anxiety. 09/27/2015   [name], MD  fexofenadine (ALLEGRA) 180 MG tablet Take 180 mg by mouth at bedtime.     Historical Provider, MD  loperamide (IMODIUM) 2 MG capsule Take 2 mg by mouth as needed for diarrhea or loose stools.    Historical Provider, MD  Melatonin 1 MG TABS Take 1 mg by mouth at bedtime.    Historical Provider, MD  metoprolol succinate (TOPROL XL) 25 MG 24 hr tablet Take 1 tablet (25 mg total) by mouth at bedtime. 09/19/15   Burtis Junes, NP  metoprolol succinate (TOPROL-XL) 50 MG 24 hr tablet Take 1 tablet (50 mg total) by mouth daily. Take with or immediately following a meal. 08/28/15   Rhonda G Barrett, PA-C  mometasone-formoterol (DULERA) 200-5 MCG/ACT AERO Inhale 2 puffs into the lungs 2 (two) times daily. 09/28/2015   [name], MD  omeprazole (PRILOSEC) 20 MG capsule Take 1 capsule (20 mg total) by mouth 2 (two) times daily before a meal. 10/16/2015   [name], MD  ondansetron (ZOFRAN ODT) 8 MG disintegrating tablet Take 1 tablet (8 mg total) by mouth every 8 (eight) hours as needed for nausea or vomiting. 09/27/15   Barton Dubois, MD  potassium chloride (K-DUR) 10 MEQ tablet Take 1 tablet (10 mEq total)  by mouth daily. 10/07/15   Mauricio Gerome Apley, MD  simethicone (MYLICON) 629 MG chewable tablet Chew 125 mg by mouth every 6 (six) hours as needed for flatulence.    Historical Provider, MD  traMADol (ULTRAM) 50 MG tablet Take 1-2 tablets (50-100 mg total) by mouth every 8 (eight) hours as needed for severe pain. 10/05/2015   [name], MD   Allergies    Allergen Reactions  . Other Other (See Comments)    Please if giving pt anesthesia - she would like to have a scopoline patch to prevent nausea   . Ciprofloxacin Hives and Other (See Comments)    Blisters on legs  . Codeine Nausea And Vomiting and Other (See Comments)    Sweating, "passes out"  . Macrobid [employer] Macro] Other (See Comments)    Stool incontinence  . Morphine And Related Other (See Comments)    "hospital bed was shaking"  . Prevnar [Pneumococcal 13-Val Conj Vacc] Palpitations and Other (See Comments)    Pt c/o redness, swelling on arm after shot.  Pt c/o having respiratory problems and coughing ever since she got it.     FAMILY HISTORY:  family history includes Alcohol abuse in her other; Arthritis in her other; Breast cancer (age of onset: 31) in her mother; Cirrhosis in her brother, brother, and brother; Diabetes in her father; Heart disease in her father and mother; Hypertension in her mother; Lung cancer in her brother. There is no history of Colon cancer, Colon polyps, Esophageal cancer, Rectal cancer, or Stomach cancer. SOCIAL HISTORY:  reports that she quit smoking about 17 months ago. Her smoking use included Cigarettes. She has a 16.5 pack-year smoking history. She has never used smokeless tobacco. She reports that she does not drink alcohol or use illicit drugs.  REVIEW OF SYSTEMS:   Constitutional: Negative for fever, chills, weight loss, malaise/fatigue and diaphoresis.  HENT: Negative for hearing loss, ear pain, nosebleeds, congestion, sore throat, neck pain, tinnitus and ear discharge.   Eyes: Negative for blurred vision, double vision, photophobia, pain, discharge and redness.  Respiratory:Positive  for cough,No  hemoptysis, no sputum production, +shortness of breath, no wheezing and stridor.  Cardiovascular: Negative for chest pain, palpitations, orthopnea, claudication, leg swelling and PND.  Gastrointestinal: Negative for heartburn, nausea,  vomiting, + abdominal pain, + diarrhea, no constipation, blood in stool and melena.  Genitourinary: Negative for dysuria, urgency, frequency, hematuria and flank pain.  Musculoskeletal: Negative for myalgias, back pain, joint pain and falls.  Skin: Negative for itching and rash.  Neurological: Negative for dizziness, tingling, tremors, sensory change, speech change, focal weakness, seizures, loss of consciousness, weakness and headaches, + generalized weakness..  Endo/Heme/Allergies: Negative for environmental allergies and polydipsia. Does not bruise/bleed easily.  SUBJECTIVE:   VITAL SIGNS: Pulse Rate:  [112-121] 112 (07/17 1142) Resp:  [20] 20 (07/17 0824) BP: (112-132)/(72-82) 112/72 mmHg (07/17 1142) SpO2:  [93 %-95 %] 95 % (07/17 1142) Weight:  [146 lb (66.225 kg)-156 lb (70.761 kg)] 146 lb (66.225 kg) (07/17 1142)  Physical Exam:  General- Ill  Appearing female in wheel chair wearing oxygen. ,  A&Ox3 ENT: No sinus tenderness, TM clear, pale nasal mucosa, no oral exudate,no post nasal drip, no LAN Cardiac: S1, S2, regular rate and rhythm, no murmur Chest: No wheeze/+ Right Basilar rales/ + Right basilar dullness; no accessory muscle use, no nasal flaring, no sternal retractions Abd.: Soft, obese, tender upon palpation Ext: No clubbing cyanosis, 1+ edema bilaterally to lower extremities Neuro:  MAEx 4, but weak per patient and her daughter. Skin: No rashes, warm and dry Psych: depressed and tearful  mood and behavior   Recent Labs Lab 10/07/15 0444 10/07/15 1403 10/24/2015 0947  NA 137  --  137  K 2.9* 3.6 3.4*  CL 103  --  97  CO2 27  --  33*  BUN 8  --  15  CREATININE 0.47  --  0.51  GLUCOSE 102*  --  120*    Recent Labs Lab 10/07/15 0444 10/03/2015 0947  HGB 10.0* 11.8*  HCT 30.4* 35.4*  WBC 10.6* 13.9*  PLT 219 337.0   Dg Chest 2 View  10/12/2015  CLINICAL DATA:  Follow-up os balloon ablation for pleural effusion and thoracentesis; patient reports shortness  of breath, history of asthma, coronary artery disease, COPD, current smoker. EXAM: CHEST  2 VIEW COMPARISON:  Chest x-ray of October 06, 2015 FINDINGS: There is a persistent multiloculated hydro pneumothorax. This has increased in size since the previous study. There is a small amount of pleural fluid noted at the left lung base. This is stable. The heart is normal in size. The pulmonary vascularity is not engorged. There is stable mild deviation of the trachea toward the right. The bony thorax exhibits no acute abnormality. IMPRESSION: Interval increase in the volume of pleural fluid present on the right. Persistent right pleural space gas collections as well consistent with multiloculated hydropneumothoraces. Stable small left pleural effusion. Electronically Signed   By: David  Martinique M.D.   On: 10/23/2015 09:52    ASSESSMENT / PLAN: A: Acute Recurrent Right Pleural Effusion:   -Most likely reactive due to slow to resolve  pancreatitis    -Right lower lobe mass March 2017 along with microscopic hematuria. - ANCA-NEGATIVE - diagnosis of vasculitis/ GPA made based on April 2017 biopsy (results do not describe granuloma though) - Status post pred ending 08/2015 due to intolerance & 3 out of 4 doses of weekly Rituxan ending 08/2015 on admit   - LUL Small Irregular Mass - suspect related to ongoing issues with vasculitis. Malignancy in differential with smoking history but it appeared quickly & there was mild pleural thickening in that area on CT scan in 05/2015, suspect inflammatory in nature.  - Not candidate for procedure / biopsy at this time  - will ned to follow up with Dr. Corrie Dandy as outpatient with repeat CT scan in one to two weeks -after repeat CT, consider biopsy of LUL mass once acute problems are resolved and patient is less deconditioned  Tobacco Abuse - 1/2ppd since age 68, current smoker - smoking cessation  counseling              - States she quit smoking 6 weeks ago.   - Acute on chronic pancreatitis with pseudocyst resulting in admission 09/28/2015. - Cause unknown: She is status post cholecystectomy - Rituxan can cause abdominal pain and GI perforation up to 14% of cases but pancreatitis itself is not   Reported.  Plan: Admit to tele bed CXR follow up of effusion TCTS Consultation for further work up and management of recurrent right pleural effusion. Will need effusion tapped once evaluated by TCTS Continue Oxygen at 2L [place] Maintain Sats >92% Will need follow up of LUL small pulmonary lesion once more stable and right effusion is resolved. Will need Follow Up with Rheumatology re: Wegners. Pt. Does not want further prednisone treatment  Magdalen Spatz, AGACNP-BC Pulmonary and [place] Pager: [phone]  [phone]  10/23/2015, 1:23 PM  Attending note: 70 yo female with complex hx of Wegener's, chronic pancreatitis and recurrent right pleural effusion with loculation.  She had thoracentesis x 2 >> acute inflammation, cytology negative.  She has re-accumulation of fluid.  She is having more chest discomfort and dyspnea.  Her symptoms have been getting worse.  She feels very weak.    Anxious, tearful in office.  HR regular  Decreased BS Rt base.  Mild tenderness over mid abdomen.  No edema.  Sitting in wheelchair, wearing oxygen.  CXR - loculated Rt pleural effusion.  Assessment/plan: Recurrent Rt pleural effusion. - will ask thoracic surgery to assess  Wegener's >> she is reluctant to go on steroids.  Recent CT chest showed irregular LUL mass. - will need to discuss with patient options for therapy  Chronic pancreatitis with pseudocysts. - defer GI consult at this time  Updated pt's daughter in the office about plan.  She requested to have PCCM as primary service while she is in hospital >> I explained that PCCM will act as  primary service.  Chesley Mires, MD [facility] Pulmonary/Critical Care 10/03/2015, 4:13 PM Pager:  [phone] After 3pm call: [phone]

## 2015-10-13 NOTE — Consult Note (Signed)
[name] [address]       [name],[name] [name]             [phone]      Cardiothoracic Surgery Consultation  Reason for Consult: large loculated right hydropneumothorax Referring Physician: Dr. Maryfrances Bunnell [name] [name] is an 70 y.o. female.  HPI:   The patient is a 70 year old woman who apparently had microscopic hematuria evaluated by urology in early 2017 and a CT of the abdomen in March 2017 showed a 3.5 cm RLL lung cavitary mass. A PET scan on 07/07/2015 showed the lesion to be hypermetabolic with an SUV max of 10.0. There were multiple areas of right sided hypermetabolic pleural thickening and nodularity suspicious for metastatic disease. There was also left posterior 7th rib hypermetabolism and probable lucency suspicious for isolated bone met. She underwent a CT guided bx of the RLL lung lesion on 4/20 which was negative for malignancy but was felt to show vasculitic features. 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 She was treated with high dose prednisone and Rituxan. She started having abdominal pain and a CT of the abdomen on 6/27 showed the RLL lung mass to be slightly smaller but obscured by a small right pleural effusion. She was also diagnosed with acute pancreatitis with a small pseudocyst which she feels was due to the prednisone. She was admitted on 7/2 and underwent right thoracentesis 7/3 removing 1.4 L of yellow serous fluid showing an exudative effusion. A repeat thoracentesis on 7/6 removed 1L of the same fluid which was also exudative. She was admitted today with worsening dyspnea and a CXR showed an enlarging right hydropneumothorax.  Past Medical History  Diagnosis Date  . Hereditary hemochromatosis ([place]) 2004  . ASTHMA   . HYPERTENSION   . GERD   . Chronic kidney disease   . H/O hiatal hernia   . Anxiety   . Hyperlipidemia   . CAD (coronary artery disease)   . Depression   . Hypertension   .  Urinary tract infection     "recurrent sometimes" (10/09/2015)  . COPD (chronic obstructive pulmonary disease) with emphysema ([place])     mild dz on PFTs 11/2013  . 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 Hx of adenomatous polyp of colon 07/25/2014  . Osteopenia 10/07/2014    DEXA @ [facility] 09/2014: -1.9 L fem  . 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 PONV (postoperative nausea and vomiting)   . On home oxygen therapy     "2L; 24/7" (10/04/2015)  . Sinus headache   . Migraine     "hx; not anymore" (09/27/2015)  . Arthritis     "all over me, I guess" (10/21/2015)    Past Surgical History  Procedure Laterality Date  . Appendectomy  ~ 1983  . Abdominal hysterectomy  ~ 1983  . Bladder suspension  03/29/2002  . Cystectomy      cyst removed from intestines to ovary    . Tendon release Left     wrist   . Cystoscopy  01/24/14    NEGATIVE;Dr Ottelin  . Colonoscopy w/ biopsies    . Esophagogastroduodenoscopy    . Laparoscopic cholecystectomy  ~ 2011  . Dilation and curettage of uterus      Family History  Problem Relation Age of Onset  . Hypertension Mother   . Heart disease Mother   .  Breast cancer Mother 69  . Diabetes Father   . Heart disease Father   . Alcohol abuse Other   . Arthritis Other   . Cirrhosis Brother   . Cirrhosis Brother   . Cirrhosis Brother   . Lung cancer Brother   . Colon cancer Neg Hx   . Colon polyps Neg Hx   . Esophageal cancer Neg Hx   . Rectal cancer Neg Hx   . Stomach cancer Neg Hx     Social History:  reports that she quit smoking about 6 weeks ago. Her smoking use included Cigarettes. She has a 27 pack-year smoking history. She has never used smokeless tobacco. She reports that she does not drink alcohol or use illicit drugs.  Allergies:  Allergies  Allergen Reactions  . Other Other (See Comments)    Please if giving pt anesthesia - she would like to have a  scopolamine patch to prevent nausea   . Ciprofloxacin Hives and Other (See Comments)    Blisters on legs  . Codeine Nausea And Vomiting and Other (See Comments)    Sweating, "passes out"  . Macrobid [employer] Macro] Other (See Comments)    Stool incontinence  . Morphine And Related Other (See Comments)    "hospital bed was shaking"  . Prevnar [Pneumococcal 13-Val Conj Vacc] Palpitations and Other (See Comments)    Pt c/o redness, swelling on arm after shot.  Pt c/o having respiratory problems and coughing ever since she got it.     Medications:  I have reviewed the patient's current medications. Prior to Admission:  Prescriptions prior to admission  Medication Sig Dispense Refill Last Dose  . acetaminophen (TYLENOL) 500 MG tablet Take 500 mg by mouth every 6 (six) hours as needed (pain).   maybe 7/16  . albuterol (PROVENTIL) (2.5 MG/3ML) 0.083% nebulizer solution Take 3 mLs (2.5 mg total) by nebulization every 6 (six) hours as needed for wheezing or shortness of breath. (Patient taking differently: Take 2.5 mg by nebulization 2 (two) times daily as needed for wheezing or shortness of breath. ) 75 mL 2 10/16/2015 at am  . aspirin EC 81 MG tablet Take 81 mg by mouth daily.   maybe 7/16  . diazepam (VALIUM) 5 MG tablet Take 0.5-1 tablets (2.5-5 mg total) by mouth every 12 (twelve) hours as needed for anxiety. (Patient taking differently: Take 5 mg by mouth every 12 (twelve) hours as needed for anxiety (sleep). ) 60 tablet 0 10/12/2015 at Unknown time  . fexofenadine (ALLEGRA) 180 MG tablet Take 180 mg by mouth at bedtime.    10/12/2015 at Unknown time  . Melatonin 5 MG TABS Take 5 mg by mouth at bedtime.   3 weeks ago  . metoprolol succinate (TOPROL XL) 25 MG 24 hr tablet Take 1 tablet (25 mg total) by mouth at bedtime. 30 tablet 6 10/12/2015 at 2200  . metoprolol succinate (TOPROL-XL) 50 MG 24 hr tablet Take 1 tablet (50 mg total) by mouth daily. Take with or immediately following a  meal. 90 tablet 3 10/25/2015 at 700  . mometasone-formoterol (DULERA) 200-5 MCG/ACT AERO Inhale 2 puffs into the lungs 2 (two) times daily. 1 Inhaler 11 10/25/2015 at am  . omeprazole (PRILOSEC) 20 MG capsule Take 1 capsule (20 mg total) by mouth 2 (two) times daily before a meal. 60 capsule 5 10/01/2015 at am  . ondansetron (ZOFRAN ODT) 8 MG disintegrating tablet Take 1 tablet (8 mg total) by mouth every 8 (eight) hours  as needed for nausea or vomiting. 20 tablet 0 10/01/2015 at am  . OXYGEN Inhale 2 L into the lungs continuous.   10/20/2015 at Unknown time  . simethicone (MYLICON) 867 MG chewable tablet Chew 125 mg by mouth 4 (four) times daily.    10/12/2015 at Unknown time  . traMADol (ULTRAM) 50 MG tablet Take 1-2 tablets (50-100 mg total) by mouth every 8 (eight) hours as needed for severe pain. 60 tablet 0 10/12/2015 at Unknown time  . potassium chloride (K-DUR) 10 MEQ tablet Take 1 tablet (10 mEq total) by mouth daily. (Patient not taking: Reported on 10/27/2015) 3 tablet 0 Not Taking at Unknown time   Scheduled: . albuterol  2.5 mg Nebulization Q6H  . aspirin EC  81 mg Oral Daily  . famotidine  20 mg Oral Daily  . [START ON 10/14/2015] feeding supplement (ENSURE ENLIVE)  237 mL Oral BID BM  . [START ON 10/14/2015] fluticasone furoate-vilanterol  1 puff Inhalation Daily  . heparin  5,000 Units Subcutaneous Q8H  . loratadine  10 mg Oral Daily  . metoprolol succinate  25 mg Oral QHS  . [START ON 10/14/2015] metoprolol succinate  50 mg Oral Daily  . [START ON 10/14/2015] potassium chloride  10 mEq Oral Daily  . sodium chloride flush  3 mL Intravenous Q12H  . sodium chloride flush  3 mL Intravenous Q12H   Continuous:  JQG:[id number] **OR** acetaminophen, diazepam, ondansetron **OR** ondansetron (ZOFRAN) IV, polyethylene glycol, simethicone, traMADol Anti-infectives    None      Results for orders placed or performed during the hospital encounter of 10/18/2015 (from the past 48 hour(s))    Comprehensive metabolic panel     Status: Abnormal   Collection Time: 09/28/2015  2:36 PM  Result Value Ref Range   Sodium 135 135 - 145 mmol/L   Potassium 3.1 (L) 3.5 - 5.1 mmol/L   Chloride 97 (L) 101 - 111 mmol/L   CO2 31 22 - 32 mmol/L   Glucose, Bld 109 (H) 65 - 99 mg/dL   BUN 12 6 - 20 mg/dL   Creatinine, Ser 0.65 0.44 - 1.00 mg/dL   Calcium 9.0 8.9 - 10.3 mg/dL   Total Protein 5.4 (L) 6.5 - 8.1 g/dL   Albumin 2.4 (L) 3.5 - 5.0 g/dL   AST 17 15 - 41 U/L   ALT 19 14 - 54 U/L   Alkaline Phosphatase 90 38 - 126 U/L   Total Bilirubin 0.8 0.3 - 1.2 mg/dL   GFR calc non Af Amer >60 >60 mL/min   GFR calc Af Amer >60 >60 mL/min    Comment: (NOTE) The eGFR has been calculated using the CKD EPI equation. This calculation has not been validated in all clinical situations. eGFR's persistently <60 mL/min signify possible Chronic Kidney Disease.    Anion gap 7 5 - 15  Magnesium     Status: None   Collection Time: 10/22/2015  2:36 PM  Result Value Ref Range   Magnesium 1.8 1.7 - 2.4 mg/dL  Phosphorus     Status: None   Collection Time: 10/22/2015  2:36 PM  Result Value Ref Range   Phosphorus 3.5 2.5 - 4.6 mg/dL  CBC WITH DIFFERENTIAL     Status: Abnormal   Collection Time: 10/01/2015  2:36 PM  Result Value Ref Range   WBC 13.8 (H) 4.0 - 10.5 K/uL   RBC 3.58 (L) 3.87 - 5.11 MIL/uL   Hemoglobin 11.4 (L) 12.0 - 15.0 g/dL  HCT 35.2 (L) 36.0 - 46.0 %   MCV 98.3 78.0 - 100.0 fL   MCH 31.8 26.0 - 34.0 pg   MCHC 32.4 30.0 - 36.0 g/dL   RDW 16.2 (H) 11.5 - 15.5 %   Platelets 313 150 - 400 K/uL   Neutrophils Relative % 72 %   Neutro Abs 10.0 (H) 1.7 - 7.7 K/uL   Lymphocytes Relative 14 %   Lymphs Abs 2.0 0.7 - 4.0 K/uL   Monocytes Relative 13 %   Monocytes Absolute 1.7 (H) 0.1 - 1.0 K/uL   Eosinophils Relative 1 %   Eosinophils Absolute 0.1 0.0 - 0.7 K/uL   Basophils Relative 0 %   Basophils Absolute 0.0 0.0 - 0.1 K/uL  APTT     Status: None   Collection Time: 10/25/2015  2:36 PM   Result Value Ref Range   aPTT 34 24 - 37 seconds  Protime-INR     Status: None   Collection Time: 10/15/2015  2:36 PM  Result Value Ref Range   Prothrombin Time 14.7 11.6 - 15.2 seconds   INR 1.13 0.00 - 1.49    Dg Chest 2 View  10/15/2015  CLINICAL DATA:  Follow-up os balloon ablation for pleural effusion and thoracentesis; patient reports shortness of breath, history of asthma, coronary artery disease, COPD, current smoker. EXAM: CHEST  2 VIEW COMPARISON:  Chest x-ray of October 06, 2015 FINDINGS: There is a persistent multiloculated hydro pneumothorax. This has increased in size since the previous study. There is a small amount of pleural fluid noted at the left lung base. This is stable. The heart is normal in size. The pulmonary vascularity is not engorged. There is stable mild deviation of the trachea toward the right. The bony thorax exhibits no acute abnormality. IMPRESSION: Interval increase in the volume of pleural fluid present on the right. Persistent right pleural space gas collections as well consistent with multiloculated hydropneumothoraces. Stable small left pleural effusion. Electronically Signed   By: David  Martinique M.D.   On: 10/20/2015 09:52    Review of Systems  Constitutional: Positive for malaise/fatigue. Negative for fever and chills.  HENT: Negative.   Eyes: Negative.   Respiratory: Positive for cough and shortness of breath. Negative for hemoptysis and sputum production.   Cardiovascular: Positive for leg swelling. Negative for chest pain and orthopnea.  Gastrointestinal: Positive for abdominal pain and diarrhea. Negative for nausea and vomiting.  Genitourinary: Negative.   Musculoskeletal: Negative.   Skin: Negative.   Neurological: Negative.   Endo/Heme/Allergies: Negative.   Psychiatric/Behavioral: The patient is nervous/anxious.    Blood pressure 101/55, pulse 109, temperature 97.9 F (36.6 C), temperature source Oral, resp. rate 16, height _0  (1.6 m), weight  71.94 kg (158 lb 9.6 oz), SpO2 99 %. Physical Exam  Constitutional: She is oriented to person, place, and time.  Obese woman, anxious and upset  HENT:  Head: Normocephalic and atraumatic.  Mouth/Throat: Oropharynx is clear and moist.  Eyes: EOM are normal. Pupils are equal, round, and reactive to light.  Neck: Normal range of motion. No JVD present. No thyromegaly present.  Cardiovascular: Normal rate, regular rhythm and normal heart sounds.   No murmur heard. Respiratory: Effort normal. No respiratory distress. She exhibits no tenderness.  Decreased breath sounds over RLL  GI: Soft. Bowel sounds are normal. She exhibits no mass.  Tender over epigastrium  Musculoskeletal: Normal range of motion. She exhibits edema. She exhibits no tenderness.  Lymphadenopathy:    She has no  cervical adenopathy.  Neurological: She is alert and oriented to person, place, and time.  Skin: Skin is warm and dry.   CLINICAL DATA: Acute pancreatitis, acute on chronic respiratory failure with hypoxia, right lower lobe lung mass  EXAM: CT CHEST WITHOUT CONTRAST  TECHNIQUE: Multidetector CT imaging of the chest was performed following the standard protocol without IV contrast.  COMPARISON: Chest radiograph dated 10/03/2015. Partial comparison to CT abdomen pelvis dated 09/29/2015. PET-CT dated 07/07/2015. CT chest dated 06/26/2015.  FINDINGS: Cardiovascular: The heart is normal in size. No pericardial effusion.  Coronary atherosclerosis of the LAD.  Atherosclerotic calcifications of the aortic arch.  Mediastinum/Nodes: Small mediastinal lymph nodes which do not meet pathologic CT size criteria.  No suspicious axillary lymphadenopathy.  Visualized thyroid is unremarkable.  Lungs/Pleura: Moderate right hydropneumothorax.  Abnormal pleural thickening in the right hemithorax with additional scattered areas of loculated pleural fluid in the right hemithorax, including along the  right major fissure. No convincing solid pleural component.  Irregular 14 mm subpleural nodule in the left upper lobe (series 5/images 18-19), new from prior CT. No additional pulmonary nodules.  Prior cavitary lesion in the posterior right lower lobe is no longer visualized, although possibly obscured.  Underlying subpleural reticulation/fibrosis with interlobular septal thickening, right lung predominant.  Overall, this appearance is nonspecific, but would be compatible with vasculitis with superimposed hydropneumothorax. Neoplasm is not entirely excluded, but is considered less likely given the imaging findings and prior biopsy results.  Upper Abdomen: Mild thickening of the left adrenal gland. Fluid along the pancreatic body/ tail (series 2/ image 129), related to known acute pancreatitis, incompletely visualized.  Musculoskeletal: Mild degenerative changes of the visualized thoracolumbar spine.  IMPRESSION: Moderate right hydropneumothorax.  Additional abnormal pleural thickening with scattered areas of pleural fluid in the right hemithorax. Additional subpleural reticulation/fibrosis with interlobular septal thickening, right greater than left. 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 See comments above.  Fluid along the pancreatic body/ tail, related to known acute pancreatitis, incompletely visualized.   Electronically Signed  By: Julian Hy M.D.  On: 10/03/2015 21:22  Assessment/Plan:  I have personally reviewed her previous CT and PET scans. Her most recent CT from 10 days ago showed a loculated right hydropneumothorax and the CXR now shows a moderate to large loculated right hydropneumothorax. She has had two prior thoracenteses that were exudative without any malignancy. She initially presented with an incidental cavitary lesion in the RLL that did not look like the typical cavitary lung cancer and was not present on a  CT of the chest in 07/2014. This lesion was hypermetabolic on PET scan with some pleural nodularity that was hypermetabolic but there was no hypermetabolic adenopathy. She has been treated as if this was vasculitis since the RLL biopsy was negative, although she was asymptomatic. I think she will now require a VATS or possibly a small thoracotomy to drain the loculated effusion effectively. I discussed the procedure with her including alternatives, benefits and risks and told her that I will talk with her daughters when they come in tomorrow. If they agree I will get this done later in the week when the schedule permits.   Gaye Pollack 10/16/2015, 7:51 PM

## 2015-10-13 NOTE — Assessment & Plan Note (Signed)
Recheck cmp  

## 2015-10-14 DIAGNOSIS — J948 Other specified pleural conditions: Secondary | ICD-10-CM

## 2015-10-14 DIAGNOSIS — R0902 Hypoxemia: Secondary | ICD-10-CM

## 2015-10-14 DIAGNOSIS — A5002 Early congenital syphilitic osteochondropathy: Secondary | ICD-10-CM

## 2015-10-14 DIAGNOSIS — R911 Solitary pulmonary nodule: Secondary | ICD-10-CM

## 2015-10-14 DIAGNOSIS — J90 Pleural effusion, not elsewhere classified: Secondary | ICD-10-CM

## 2015-10-14 LAB — BASIC METABOLIC PANEL
Anion gap: 7 (ref 5–15)
BUN: 9 mg/dL (ref 6–20)
CHLORIDE: 97 mmol/L — AB (ref 101–111)
CO2: 31 mmol/L (ref 22–32)
Calcium: 8.6 mg/dL — ABNORMAL LOW (ref 8.9–10.3)
Creatinine, Ser: 0.59 mg/dL (ref 0.44–1.00)
GFR calc non Af Amer: >60 mL/min (ref >60)
Glucose, Bld: 109 mg/dL — ABNORMAL HIGH (ref 65–99)
POTASSIUM: 3.3 mmol/L — AB (ref 3.5–5.1)
SODIUM: 135 mmol/L (ref 135–145)

## 2015-10-14 LAB — GLUCOSE, CAPILLARY: Glucose-Capillary: 113 mg/dL — ABNORMAL HIGH (ref 65–99)

## 2015-10-14 LAB — CBC
HEMATOCRIT: 31.3 % — AB (ref 36.0–46.0)
Hemoglobin: 10.2 g/dL — ABNORMAL LOW (ref 12.0–15.0)
MCH: 31.7 pg (ref 26.0–34.0)
MCHC: 32.6 g/dL (ref 30.0–36.0)
MCV: 97.2 fL (ref 78.0–100.0)
PLATELETS: 266 10*3/uL (ref 150–400)
RBC: 3.22 MIL/uL — AB (ref 3.87–5.11)
RDW: 16.2 % — ABNORMAL HIGH (ref 11.5–15.5)
WBC: 10.8 10*3/uL — AB (ref 4.0–10.5)

## 2015-10-14 MED ORDER — PRO-STAT SUGAR FREE PO LIQD
30.0000 mL | Freq: Three times a day (TID) | ORAL | Status: DC
  Administered 2015-10-14 – 2015-10-21 (×12): 30 mL via ORAL
  Filled 2015-10-14 (×14): qty 30

## 2015-10-14 MED ORDER — POTASSIUM CHLORIDE CRYS ER 10 MEQ PO TBCR
10.0000 meq | EXTENDED_RELEASE_TABLET | Freq: Two times a day (BID) | ORAL | Status: DC
  Administered 2015-10-14 – 2015-10-16 (×4): 10 meq via ORAL
  Filled 2015-10-14 (×4): qty 1

## 2015-10-14 MED ORDER — ENSURE ENLIVE PO LIQD
237.0000 mL | Freq: Two times a day (BID) | ORAL | Status: DC
  Administered 2015-10-14 – 2015-10-23 (×6): 237 mL via ORAL

## 2015-10-14 MED ORDER — ADULT MULTIVITAMIN W/MINERALS CH
1.0000 | ORAL_TABLET | Freq: Every day | ORAL | Status: DC
  Administered 2015-10-14 – 2015-10-28 (×14): 1 via ORAL
  Filled 2015-10-14 (×14): qty 1

## 2015-10-14 NOTE — Progress Notes (Signed)
Name: [name] [name] MRN: [id number] DOB: 03/27/1946    ADMISSION DATE:  10/16/2015  CHIEF COMPLAINT:  Dyspnea  BRIEF PATIENT DESCRIPTION:  Elderly female wearing oxygen OOB in chair, in no distress.  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 Persistent right pleural space gas collections as well consistent with multiloculated hydropneumothoraces. Stable small left pleural effusion. 09/16/2015: Echo>>Normal LV size with mild LV hypertrophy, EF 65-70%. Normal RV  size and systolic function. No significant valvular  abnormalities.   CULTURES:  UC 7/2 >> recollection suggested UC 7/3 >> recollection suggested Pleural Fluid R 7/3 >> negative  Pleural Fluid R 7/6 >>  AUTOIMMUNE: ESR 7/7 >> 45 GBM 7/7 >>  ANCA 7/7 >> negative   HISTORY OF PRESENT ILLNESS:   70 yo female with complex hx of Wegener's, chronic pancreatitis and recurrent right pleural effusion with loculation.  She had thoracentesis x 2 >> acute inflammation, cytology negative.   It appears in May 2016 she had a CT MG gram of the chest that did not show any pulmonary issues including infiltrates or pulmonary embolism. Then early in 2017 she had microscopic hematuria that was evaluated by urology  with a CT abdomen mid March 2017 that showed normal pancreas but had a 3.5 cm right lower lobe cavitating mass. This was followed up by a CT chest 06/26/2015 that confirm the 3.5 cm right lower lobe thick-walled cavitating mass along with a left adrenal adenoma. The mediastinum itself was normal. She was then referred to Dr. Gerrit Friends DeDios for evaluation. She underwent CT-guided transthoracic needle biopsy 07/17/2015 that in discussion with the pathologist showed vasculitic features. I'm not sure if there was granuloma or not. 07/24/2015 she underwent limited autoimmune workup that showed screening p-ANCA positive (no c-anca, pr3 or mpo avail). Then 08/06/15 she had full panel with Dr Trudie Reed and per phone call PR-3 and MPO antibody results were NEGATIVE and only P-ANCA was trace posiotive. 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 Patient then committed to high-dose prednisone and also Rituxan once a week for 4 weeks infusion. She completed 3 weeks of this infusion he did she was supposed to get her fourth dose a little 10/01/2015 on 09/24/2015 but she missed it because of illness.  She believes ever since starting high-dose prednisone she started developing abdominal pain. A CT abdomen 09/23/2015 showed the right lower lobe mass at 2.6 cm perhaps smaller but obscured by a right pleural effusion that was small. But she had acute pancreatitis features with small pseudocyst. She blames the high-dose prednisone for her current problems. She does not think Rituxan is the causative agent for this.   She reports she started developing abdominal pain and finally was admitted 09/28/2015. Since admission the right pleural effusion got worse and she had a thoracentesis 09/29/2015 that showed idiopathic exudate with acute inflammatory cells and mesothelial cells with an LDH  of 400. Thoracentesis was repeated 10/02/2015 with an LDH of 336. This was complicated by small right basilar pneumothorax  on chest x-ray  10/03/2015 which radiology is conservatively following. She was discharged from [facility] on 10/07/15  Given her pulmonary problems PCCM consulted for evaluation at her last admission  She feels miserable because of a bowel pain and depressed and also has some Cushing features. She blames all her problems on prednisone. She presented to the pulmonary office today with complaints of worsening Dyspnea. CXR indicates worsening of the right exudative pleural effusion. As this has been tapped twice, we will plan a TCTS referral for further work up and treatment. She has developed some edema to her lower extremities which she states is new within the last week, and abdominal girth is concerning for Ascites.. She states that she does not take lasix due to the fact she has problems with hypokalemia in the past.   SUBJECTIVE:  States she feels about the same, some dyspnea on exertion, generalized abdominal pain, but is less tearful today.  VITAL SIGNS: Temp:  [97.9 F (36.6 C)-99 F (37.2 C)] 99 F (37.2 C) (07/18 0539) Pulse Rate:  [109-119] 119 (07/18 0930) Resp:  [16-18] 18 (07/18 0539) BP: (101-122)/(55-65) 109/65 mmHg (07/18 0930) SpO2:  [95 %-99 %] 95 % (07/18 0725) Weight:  [158 lb 9.6 oz (71.94 kg)] 158 lb 9.6 oz (71.94 kg) (07/17 1409)  Physical Exam:  General- Elderly female OOB in chair wearing nasal oxygen ar 2 L ENT: No sinus tenderness, TM clear, pale nasal mucosa, no oral exudate,no post nasal drip, no LAN Cardiac: S1, S2, regular rate and rhythm, no murmur Chest: No wheeze/+ Right Basilar rales/ Diminished BS per right base; no accessory muscle use, no nasal flaring, no sternal retractions Abd.: Soft, obese, tender upon palpation Ext: No clubbing cyanosis, trace edema bilaterally to lower extremities Neuro:  MAEx 4, weak and deconditioned Skin: No rashes, warm and dry Psych: depressed and tearful  mood and behavior   Recent Labs Lab 10/25/2015 0947  10/11/2015 1436 10/14/15 0633  NA 137 135 135  K 3.4* 3.1* 3.3*  CL 97 97* 97*  CO2 33* 31 31  BUN '15 12 9  '$ CREATININE 0.51 0.65 0.59  GLUCOSE 120* 109* 109*    Recent Labs Lab 10/21/2015 0947 10/16/2015 1436 10/14/15 0633  HGB 11.8* 11.4* 10.2*  HCT 35.4* 35.2* 31.3*  WBC 13.9* 13.8* 10.8*  PLT 337.0 313 266   Dg Chest 2 View  10/25/2015  CLINICAL DATA:  Follow-up os balloon ablation for pleural effusion and thoracentesis; patient reports shortness of breath, history of asthma, coronary artery disease, COPD, current smoker. EXAM: CHEST  2 VIEW COMPARISON:  Chest x-ray of October 06, 2015 FINDINGS: There is a persistent multiloculated hydro pneumothorax. This has increased in size since the previous study. There is a small amount of pleural fluid noted at the left lung base. This is stable. The heart is normal in size. The pulmonary vascularity is not engorged. There is stable mild deviation of the trachea toward the right. The bony thorax exhibits no acute abnormality. IMPRESSION: Interval increase in the volume of pleural fluid present on the right. Persistent right pleural space gas collections as well consistent with multiloculated hydropneumothoraces. Stable small left pleural effusion. Electronically Signed   By: David  Martinique M.D.   On: 10/19/2015 09:52     ASSESSMENT / PLAN:  PULMONARY A: Acute Recurrent Loculated Right Exudative Pleural Effusion:most likely reactive 2/2 pancreatitis Wegner's  Disease: reluctant to treat  prednisone Right lower lobe mass March 2017 along with microscopic hematuria. - ANCA-NEGATIVE LUL Small Irregular Mass  Tobacco Abuse - 1/2ppd since age 30, current smoker. States she quit 6 weeks ago Remains on 2L [place], no distress, dyspnea with exertion P: Appreciate TCTS assist  Scheduled BD treatments as ordered  TCTS to talk with family today re: surgical options for tx of recurrent pleural effusion( VATS/ small thorocotomy) Continue oxygen at 2L  [place] Maintain saturations greater than 92% Trend CXR as needed OOB to chair twice daily IS Q 2 while awake Ambulate as able Will need outpatient follow up for L lung nodule/ repeat CT .consider biopsy of LUL mass once acute problems are resolved and patient is less deconditioned Continue to encourage smoking cessation   CARDIOVASCULAR A:  No Acute Issues P:  Monitor tele bed VS per unit protocol  RENAL A:  Hypokalemia  P:   Trend BMET  Monitor and replete electrolytes as needed Additional dose 10 mEq x 1  today.  GASTROINTESTINAL Acute on chronic pancreatitis with pseudocyst   A:   No nausea, vomiting or diarrhea today Better appetite today Continued generalized abdominal tenderness. Status post choleycystectomy P:   Hearty health diet Pepcid as prophylaxis Zofran  as needed Simethicone as needed Encourage Ensure as diet supplement until appetite improves Will defer GI consult for now  HEMATOLOGIC A:  Anemia with decrease in HGB of 1 gram overnight       No overt signs of bleeding       Coags WNL P:  Trend CBC daily Monitor for bleeding Consider d/c ing sq heparin if HGB continues to downtrend. PAS Hose  INFECTIOUS A: Leukocytosis ( Down trending)      Low Grade Temperature  P:  Blood Cultures for Temp > 101.5       Monitor Fever Curve        Consider ABX/ cultures if WBC up- trends   ENDOCRINE A:   CBG's slightly elevated   P:    Consider initiation of SSI   NEUROLOGIC A:   No Acute Issues Awake alert and oriented. MAE x 4 Deconditioned/ weak P:   Consider PT Consult for conditioning   FAMILY  - Updates: No Family at bedside. Meeting with Dr. Cyndia Bent planned for later today to discuss surgical options for treatment of recurrent pleural effusion.  - Inter-disciplinary family meet or Palliative Care meeting due by:  October 20, 2015.   Magdalen Spatz, AGACNP-BC [facility] Pager #  [phone] 10/14/2015  Attending Note:  70 year old female presenting with hydropneumothorax that is loculated and recurring.  On exam, decreased BS bilaterally.  I reviewed CXR myself, effusion with PTX noted.  Discussed with PCCM-NP.    Hydropneumothorax:  - CVTS to see.  - Likely to the OR later this week.  - Dr. Cyndia Bent will see patient and family at 5 PM today.  [place]: refusing treatment.  Hypoxemia: due to above,   - Titrate O2 for sat of 88-92%.  - Ambulatory desaturation study prior to discharge for ? Of home.  Pulmonary nodules:  - If going to the OR, depending on the procedure, may consider a biopsy  - F/U imaging.  Patient seen and examined, agree with above note.  I dictated the care and orders written for this patient under my direction.  Rush Farmer, MD [phone]

## 2015-10-14 NOTE — Progress Notes (Signed)
Procedure(s) (LRB): VIDEO ASSISTED THORACOSCOPY (VATS)/POSSIBLE THOROCOTOMY (Right) DRAINAGE OF PLEURAL EFFUSION (Right) Subjective: Continues to complain of some right back pain and shortness of breath. Feels like she can't take a deep breath.  Objective: Vital signs in last 24 hours: Temp:  [98.1 F (36.7 C)-99 F (37.2 C)] 98.7 F (37.1 C) (07/18 2204) Pulse Rate:  [112-126] 126 (07/18 2204) Cardiac Rhythm:  [-] Sinus tachycardia (07/18 1900) Resp:  [16-20] 16 (07/18 2204) BP: (109-122)/(55-71) 112/67 mmHg (07/18 2204) SpO2:  [95 %-98 %] 97 % (07/18 2204)  Hemodynamic parameters for last 24 hours:    Intake/Output from previous day: 07/17 0701 - 07/18 0700 In: 250 [P.O.:250] Out: -  Intake/Output this shift: Total I/O In: 3 [I.V.:3] Out: -   General appearance: alert and cooperative Heart: regular rate and rhythm, S1, S2 normal, no murmur, click, rub or gallop Lungs: diminished breath sounds RLL  Lab Results:  Recent Labs  10/27/2015 1436 10/14/15 0633  WBC 13.8* 10.8*  HGB 11.4* 10.2*  HCT 35.2* 31.3*  PLT 313 266   BMET:  Recent Labs  10/14/2015 1436 10/14/15 0633  NA 135 135  K 3.1* 3.3*  CL 97* 97*  CO2 31 31  GLUCOSE 109* 109*  BUN 12 9  CREATININE 0.65 0.59  CALCIUM 9.0 8.6*    PT/INR:  Recent Labs  10/21/2015 1436  LABPROT 14.7  INR 1.13   ABG    Component Value Date/Time   PHART 7.473* 10/05/2015 2223   HCO3 26.7* 10/05/2015 2223   TCO2 24.2 10/05/2015 2223   O2SAT 92.2 10/05/2015 2223   CBG (last 3)   Recent Labs  10/14/15 0752  GLUCAP 113*    Assessment/Plan:  Large right loculated hydropneumothorax. I discussed surgical therapy with VATS or thoracotomy with her and her two sisters today. We discussed alternative, benefits and risks including but not limited to bleeding, infection, injury to the lung with prolonged air leak, and recurrent effusion. She says that she understands and agrees to proceed. I will plan to do on  Friday am.    LOS: 1 day    Gaye Pollack 10/14/2015

## 2015-10-14 NOTE — Progress Notes (Signed)
Initial Nutrition Assessment   INTERVENTION:  Ensure Enlive po BID, each supplement provides 350 kcal and 20 grams of protein Provide snacks BID   NUTRITION DIAGNOSIS:   Inadequate oral intake related to acute illness, other (see comment) (and early satiety and dislike of hospital food) as evidenced by per patient/family report, meal completion < 50%.   GOAL:   Patient will meet greater than or equal to 90% of their needs   MONITOR:   PO intake, Weight trends, Supplement acceptance, Labs, Skin, I & O's  REASON FOR ASSESSMENT:   Malnutrition Screening Tool    ASSESSMENT:   70 year old female with history of COPD, HTN, HLD, anxiety, and depression. She feels miserable because of a bowel pain and depressed and also has some Cushing features. She blames all her problems on prednisone. She presented to the pulmonary office today with complaints of worsening Dyspnea. CXR indicates worsening of the right exudative pleural effusion.  Pt reports eating poorly due to not like the hospital food and due to early satiety; she also reports occasional nausea. She report eating better at home, between hospitalizations. Per nursing notes, pt is eating 30-50% of meals; pt reports eating 25% or less. Pt reports losing 10-15 lbs in the past month. Per weight history, pt's weight has fluctuated +/- 10 lbs over the past 3 months. RD emphasized the importance of adequate healthful PO intake and protein rich foods. She reports tolerating Ensure Enlive when she sips it slowly. She is agreeable to pro-stat as well since her protein intake is limited to mostly fish.   Labs: low potassium, low chloride, low hemoglobin, high lipase  Diet Order:  Diet Heart Room service appropriate?: Yes; Fluid consistency:: Thin  Skin:  Reviewed, no issues  Last BM:  7/17  Height:   Ht Readings from Last 1 Encounters:  10/22/2015 5\' 3"  (1.6 m)    Weight:   Wt Readings from Last 1 Encounters:  10/09/2015 158 lb 9.6  oz (71.94 kg)    Ideal Body Weight:  52.3 kg  BMI:  Body mass index is 28.1 kg/(m^2).  Estimated Nutritional Needs:   Kcal:  1600-1800  Protein:  90-100 grams  Fluid:  1.6-1.8 L/day  EDUCATION NEEDS:   No education needs identified at this time  [place], LDN Inpatient Clinical Dietitian Pager: [phone] After Hours Pager: [phone]

## 2015-10-14 NOTE — Progress Notes (Signed)
Pt had 7 beat run of VTAC. Paged Dr Halford Chessman. Waiting to hear back from dr. Abbott Pao is in stable condition. VSS. Pt just received am medications. Rhythm is now ST 119. Pt voices no complaints at this time other than pain, where Ultram was given. Will continue to monitor pt.

## 2015-10-14 NOTE — Progress Notes (Signed)
Pt admitted to hospital for further evaluation.  Chesley Mires, MD [facility] Pulmonary/Critical Care 10/14/2015, 8:20 AM Pager:  [phone]

## 2015-10-15 ENCOUNTER — Inpatient Hospital Stay (HOSPITAL_COMMUNITY): Payer: Medicare Other

## 2015-10-15 DIAGNOSIS — D72829 Elevated white blood cell count, unspecified: Secondary | ICD-10-CM

## 2015-10-15 DIAGNOSIS — R739 Hyperglycemia, unspecified: Secondary | ICD-10-CM

## 2015-10-15 DIAGNOSIS — I10 Essential (primary) hypertension: Secondary | ICD-10-CM

## 2015-10-15 LAB — CBC WITH DIFFERENTIAL/PLATELET
Basophils Absolute: 0 10*3/uL (ref 0.0–0.1)
Basophils Relative: 0 %
Eosinophils Absolute: 0.3 10*3/uL (ref 0.0–0.7)
Eosinophils Relative: 3 %
HEMATOCRIT: 32 % — AB (ref 36.0–46.0)
HEMOGLOBIN: 10.4 g/dL — AB (ref 12.0–15.0)
LYMPHS ABS: 1.8 10*3/uL (ref 0.7–4.0)
LYMPHS PCT: 16 %
MCH: 31.6 pg (ref 26.0–34.0)
MCHC: 32.5 g/dL (ref 30.0–36.0)
MCV: 97.3 fL (ref 78.0–100.0)
Monocytes Absolute: 1.5 10*3/uL — ABNORMAL HIGH (ref 0.1–1.0)
Monocytes Relative: 13 %
NEUTROS ABS: 7.8 10*3/uL — AB (ref 1.7–7.7)
NEUTROS PCT: 68 %
Platelets: 299 10*3/uL (ref 150–400)
RBC: 3.29 MIL/uL — AB (ref 3.87–5.11)
RDW: 16.2 % — ABNORMAL HIGH (ref 11.5–15.5)
WBC: 11.3 10*3/uL — AB (ref 4.0–10.5)

## 2015-10-15 LAB — AMYLASE: Amylase: 140 U/L — ABNORMAL HIGH (ref 28–100)

## 2015-10-15 LAB — BASIC METABOLIC PANEL
ANION GAP: 10 (ref 5–15)
BUN: 8 mg/dL (ref 6–20)
CHLORIDE: 96 mmol/L — AB (ref 101–111)
CO2: 29 mmol/L (ref 22–32)
Calcium: 8.9 mg/dL (ref 8.9–10.3)
Creatinine, Ser: 0.52 mg/dL (ref 0.44–1.00)
GFR calc non Af Amer: >60 mL/min (ref >60)
Glucose, Bld: 123 mg/dL — ABNORMAL HIGH (ref 65–99)
Potassium: 3.9 mmol/L (ref 3.5–5.1)
SODIUM: 135 mmol/L (ref 135–145)

## 2015-10-15 LAB — GLUCOSE, CAPILLARY: GLUCOSE-CAPILLARY: 128 mg/dL — AB (ref 65–99)

## 2015-10-15 LAB — LIPASE, BLOOD: Lipase: 125 U/L — ABNORMAL HIGH (ref 11–51)

## 2015-10-15 NOTE — Care Management Note (Signed)
Case Management Note  Patient Details  Name: [name] [name] MRN: [id number] Date of Birth: 02/21/1946  Subjective/Objective:                 Patient admitted with pleurel effusion. Will have cardiothotacic procedure Friday. Lives in [place] alone, however two daughters and friend have been providing 24/7 support at home and per patient will contyinue to do so after DC. Patient recently [place] with [place] RN PT [facility], and Oxygen through [facility]. Patient does not use walker or cane, and states she does not anticipate needing one at DC.  PCP Celso Amy.   Action/Plan:  Patient will need resumption order for [facility] placed at time of discharge if plan remains DC to home.  CM will continue to follow.  Expected Discharge Date:                  Expected Discharge Plan:  [place]  In-House Referral:     Discharge planning Services  CM Consult  Post Acute Care Choice:  Resumption of Svcs/PTA Provider Choice offered to:  Patient  DME Arranged:  Oxygen DME Agency:  [place]. (home oxygen)  HH Arranged:  RN, PT HH Agency:     Status of Service:  In process, will continue to follow  If discussed at Long Length of Stay Meetings, dates discussed:    Additional Comments:  Carles Collet, RN 10/15/2015, 10:33 AM

## 2015-10-15 NOTE — Progress Notes (Signed)
Name: [name] [name] MRN: [id number] DOB: Aug 10, 1945    ADMISSION DATE:  10/10/2015  CHIEF COMPLAINT:  Dyspnea  BRIEF PATIENT DESCRIPTION:  Elderly female wearing oxygen OOB in chair, in no distress.  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 Persistent right pleural space gas collections as well consistent with multiloculated hydropneumothoraces. Stable small left pleural effusion. 09/16/2015: Echo>>Normal LV size with mild LV hypertrophy, EF 65-70%. Normal RV  size and systolic function. No significant valvular  abnormalities.   CULTURES:  UC 7/2 >> recollection suggested UC 7/3 >> recollection suggested Pleural Fluid R 7/3 >> negative  Pleural Fluid R 7/6 >>  AUTOIMMUNE: ESR 7/7 >> 45 GBM 7/7 >>  ANCA 7/7 >> negative   HISTORY OF PRESENT ILLNESS:   70 yo female with complex hx of Wegener's, chronic pancreatitis and recurrent right pleural effusion with loculation.  She had thoracentesis x 2 >> acute inflammation, cytology negative.   It appears in May 2016 she had a CT MG gram of the chest that did not show any pulmonary issues including infiltrates or pulmonary embolism. Then early in 2017 she had microscopic hematuria that was evaluated by urology  with a CT abdomen mid March 2017 that showed normal pancreas but had a 3.5 cm right lower lobe cavitating mass. This was followed up by a CT chest 06/26/2015 that confirm the 3.5 cm right lower lobe thick-walled cavitating mass along with a left adrenal adenoma. The mediastinum itself was normal. She was then referred to Dr. Gerrit Friends DeDios for evaluation. She underwent CT-guided transthoracic needle biopsy 07/17/2015 that in discussion with the pathologist showed vasculitic features. I'm not sure if there was granuloma or not. 07/24/2015 she underwent limited autoimmune workup that showed screening p-ANCA positive (no c-anca, pr3 or mpo avail). Then 08/06/15 she had full panel with Dr Trudie Reed and per phone call PR-3 and MPO antibody results were NEGATIVE and only P-ANCA was trace posiotive. 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 Patient then committed to high-dose prednisone and also Rituxan once a week for 4 weeks infusion. She completed 3 weeks of this infusion he did she was supposed to get her fourth dose a little 10/01/2015 on 09/24/2015 but she missed it because of illness.  She believes ever since starting high-dose prednisone she started developing abdominal pain. A CT abdomen 09/23/2015 showed the right lower lobe mass at 2.6 cm perhaps smaller but obscured by a right pleural effusion that was small. But she had acute pancreatitis features with small pseudocyst. She blames the high-dose prednisone for her current problems. She does not think Rituxan is the causative agent for this.   She reports she started developing abdominal pain and finally was admitted 09/28/2015. Since admission the right pleural effusion got worse and she had a thoracentesis 09/29/2015 that showed idiopathic exudate with acute inflammatory cells and mesothelial cells with an LDH  of 400. Thoracentesis was repeated 10/02/2015 with an LDH of 336. This was complicated by small right basilar pneumothorax  on chest x-ray  10/03/2015 which radiology is conservatively following. She was discharged from [facility] on 10/07/15  Given her pulmonary problems PCCM consulted for evaluation at her last admission  She feels miserable because of a bowel pain and depressed and also has some Cushing features. She blames all her problems on prednisone. She presented to the pulmonary office today with complaints of worsening Dyspnea. CXR indicates worsening of the right exudative pleural effusion. As this has been tapped twice, we will plan a TCTS referral for further work up and treatment. She has developed some edema to her lower extremities which she states is new within the last week, and abdominal girth is concerning for Ascites.. She states that she does not take lasix due to the fact she has problems with hypokalemia in the past.   SUBJECTIVE:  Respiratory status improved.  No new complaints.  VITAL SIGNS: Temp:  [98.1 F (36.7 C)-98.7 F (37.1 C)] 98.1 F (36.7 C) (07/19 0532) Pulse Rate:  [108-126] 119 (07/19 0532) Resp:  [16-20] 17 (07/19 0532) BP: (109-118)/(62-71) 113/62 mmHg (07/19 0532) SpO2:  [94 %-98 %] 97 % (07/19 0748) FiO2 (%):  [28 %] 28 % (07/19 0748) Weight:  [66.3 kg (146 lb 2.6 oz)] 66.3 kg (146 lb 2.6 oz) (07/19 0500)  Physical Exam:  General- Elderly female OOB in chair wearing nasal oxygen ar 2 L ENT: No sinus tenderness, TM clear, pale nasal mucosa, no oral exudate,no post nasal drip, no LAN Cardiac: S1, S2, regular rate and rhythm, no murmur Chest: No wheeze/+ Right Basilar rales/ Diminished BS per right base; no accessory muscle use, no nasal flaring, no sternal retractions Abd.: Soft, obese, tender upon palpation Ext: No clubbing cyanosis, trace edema bilaterally to lower extremities Neuro:  MAEx 4, weak and deconditioned Skin: No rashes, warm and dry Psych: depressed and tearful  mood and behavior   Recent Labs Lab 10/16/2015 1436 10/14/15 0633 10/15/15 0548  NA 135  135 135  K 3.1* 3.3* 3.9  CL 97* 97* 96*  CO2 '31 31 29  '$ BUN '12 9 8  '$ CREATININE 0.65 0.59 0.52  GLUCOSE 109* 109* 123*    Recent Labs Lab 10/03/2015 1436 10/14/15 0633 10/15/15 0548  HGB 11.4* 10.2* 10.4*  HCT 35.2* 31.3* 32.0*  WBC 13.8* 10.8* 11.3*  PLT 313 266 299   Dg Chest 2 View  10/15/2015  CLINICAL DATA:  Pleural effusion with pancreatitis. Chest pain, nausea, diaphoresis. EXAM: CHEST  2 VIEW COMPARISON:  10/05/2015 FINDINGS: The heart size and mediastinal contours are within normal limits. Moderate multiloculated right hydro-pneumothorax shows no significant change, with associated compressive right lung atelectasis. Tiny left pleural effusion is mildly increased in size. Mild left basilar atelectasis or scarring is stable. IMPRESSION: No significant change in moderate multiloculated right hydro-pneumothorax and associated compressive atelectasis. Increased size of tiny left pleural effusion. Electronically Signed   By: Earle Gell M.D.   On: 10/15/2015 08:35   I reviewed CXR myself: hydropneumothorax noted.  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 - ANCA-NEGATIVE LUL Small Irregular Mass  Tobacco Abuse - 1/2ppd since age 42, current smoker. States she quit 6 weeks ago Remains on 2L [place], no distress, dyspnea with exertion P: Appreciate  TCTS assist - plan on visit to the OR on Friday. Scheduled BD treatments as ordered  Continue oxygen at 2L [place] Maintain saturations greater than 92% Trend CXR as needed OOB to chair twice daily IS Q 2 while awake Ambulate as able Will need outpatient follow up for L lung nodule/ repeat CT .consider biopsy of LUL mass once acute problems are resolved and patient is less deconditioned Continue to encourage smoking cessation    CARDIOVASCULAR A:  No Acute Issues P:  Monitor tele bed. VS per unit protocol.  RENAL A:  Hypokalemia  P:   Trend BMET. Monitor and replete electrolytes as needed. Additional dose 10 mEq x 1  today.  GASTROINTESTINAL Acute on chronic pancreatitis with pseudocyst   A:   No nausea, vomiting or diarrhea today Better appetite today Continued generalized abdominal tenderness. Status post choleycystectomy P:   Heart health diet Pepcid as prophylaxis Zofran  as needed Simethicone as needed Encourage Ensure as diet supplement until appetite improves Will defer GI consult for now  HEMATOLOGIC A:  Anemia with decrease in HGB of 1 gram overnight       No overt signs of bleeding       Coags WNL P:  Trend CBC daily Monitor for bleeding Consider d/c ing sq heparin if HGB continues to downtrend. PAS Hose  INFECTIOUS A: Leukocytosis ( Down trending)      Low Grade Temperature  P:  Blood Cultures for Temp > 101.5       Monitor Fever Curve        Consider ABX/ cultures if WBC up- trends   ENDOCRINE A:   CBG's slightly elevated   P:    Consider initiation of SSI   NEUROLOGIC A:   No Acute Issues Awake alert and oriented. MAE x 4 Deconditioned/ weak P:   PT consult for conditioning  FAMILY  - Updates: Patient updated at length bedside, anticipate surgery on Friday.   - Inter-disciplinary family meet or Palliative Care meeting due by:  October 20, 2015.  Rush Farmer, M.D. [facility] Pulmonary/Critical Care Medicine. Pager: [phone]. After hours pager: [phone].

## 2015-10-16 ENCOUNTER — Ambulatory Visit: Payer: Self-pay | Admitting: *Deleted

## 2015-10-16 DIAGNOSIS — J939 Pneumothorax, unspecified: Secondary | ICD-10-CM | POA: Diagnosis present

## 2015-10-16 DIAGNOSIS — E876 Hypokalemia: Secondary | ICD-10-CM

## 2015-10-16 DIAGNOSIS — J9601 Acute respiratory failure with hypoxia: Secondary | ICD-10-CM | POA: Diagnosis present

## 2015-10-16 LAB — GLUCOSE, CAPILLARY: Glucose-Capillary: 121 mg/dL — ABNORMAL HIGH (ref 65–99)

## 2015-10-16 MED ORDER — SODIUM CHLORIDE 0.9 % IV BOLUS (SEPSIS)
1000.0000 mL | Freq: Once | INTRAVENOUS | Status: AC
  Administered 2015-10-16: 1000 mL via INTRAVENOUS

## 2015-10-16 MED ORDER — TRAMADOL HCL 50 MG PO TABS
50.0000 mg | ORAL_TABLET | Freq: Four times a day (QID) | ORAL | Status: DC | PRN
  Administered 2015-10-17 – 2015-10-26 (×16): 50 mg via ORAL
  Filled 2015-10-16 (×17): qty 1

## 2015-10-16 MED ORDER — POTASSIUM CHLORIDE CRYS ER 20 MEQ PO TBCR
40.0000 meq | EXTENDED_RELEASE_TABLET | Freq: Once | ORAL | Status: AC
  Administered 2015-10-16: 40 meq via ORAL
  Filled 2015-10-16: qty 2

## 2015-10-16 NOTE — Progress Notes (Deleted)
Name: [name] [name] MRN: [id number] DOB: [birth year]    ADMISSION DATE:  09/30/2015  CHIEF COMPLAINT:  Dyspnea  SUBJECTIVE:   Breathing is improved but still has SOB with exertion.  Plan for OR Fri 7/21 for VATS.   VITAL SIGNS: Temp:  [97.5 F (36.4 C)-98.4 F (36.9 C)] 97.5 F (36.4 C) (07/20 0552) Pulse Rate:  [97-104] 97 (07/20 1031) Resp:  [18] 18 (07/20 0552) BP: (87-131)/(62-72) 87/70 mmHg (07/20 1031) SpO2:  [92 %-100 %] 98 % (07/20 0758) FiO2 (%):  [28 %] 28 % (07/19 1345) Weight:  [147 lb 8 oz (66.906 kg)] 147 lb 8 oz (66.906 kg) (07/20 0552)  Physical Exam: General: Elderly female, resting in bed, in NAD. Neuro: A&O x 3, non-focal.  HEENT: [place]/AT. PERRL, sclerae anicteric. Cardiovascular: RRR, no M/R/G.  Lungs: Respirations even and unlabored.  Diminished on right. Abdomen: BS x 4, soft, NT/ND.  Musculoskeletal: No gross deformities, no edema.  Skin: Intact, warm, no rashes.     Recent Labs Lab 09/27/2015 1436 10/14/15 0633 10/15/15 0548  NA 135 135 135  K 3.1* 3.3* 3.9  CL 97* 97* 96*  CO2 '31 31 29  '$ BUN '12 9 8  '$ CREATININE 0.65 0.59 0.52  GLUCOSE 109* 109* 123*    Recent Labs Lab 10/09/2015 1436 10/14/15 0633 10/15/15 0548  HGB 11.4* 10.2* 10.4*  HCT 35.2* 31.3* 32.0*  WBC 13.8* 10.8* 11.3*  PLT 313 266 299   Dg Chest 2 View  10/15/2015  CLINICAL DATA:  Pleural effusion with pancreatitis. Chest pain, nausea, diaphoresis. EXAM: CHEST  2 VIEW COMPARISON:  09/30/2015 FINDINGS: The heart size and mediastinal contours are within normal limits. Moderate multiloculated right hydro-pneumothorax shows no significant change, with associated compressive right lung atelectasis. Tiny left pleural effusion is mildly increased in size. Mild left basilar atelectasis or scarring is stable. IMPRESSION: No significant change in moderate multiloculated right hydro-pneumothorax and associated compressive atelectasis. Increased size of tiny left pleural effusion.  Electronically Signed   By: Earle Gell M.D.   On: 10/15/2015 08:35     STUDIES:  7/07 CT Chest >> moderate R hydropneumothorax, abnormal pleural thickening with scattered areas of pleural fluid in the R hemithorax, additional subpleural reticulation / fibrosis with interlobular septal thickening R>L, fluid along the pancreatic body/tail related to known acute pancreatitis 7/03 Cytology from [facility] >> acute inflammation, no malignant cells 7/07 Cytology from [facility] >> no malignant cells  10/10/2015 CXR >> increase in size of right effusion, persistent gas on right c/w multiloculated hydropneumothoraces. 09/16/2015: Echo>> EF 65-70%.    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  prednisone Right lower lobe mass March 2017 along with microscopic hematuria - ANCA negative LUL Small Irregular Mass  Tobacco Abuse - 1/2ppd since age 70, current smoker though reports she quit 6 weeks ago Plan: Appreciate TCTS assist - planning for VATS tomorrow 7/21 Continue supplemental O2 as needed to maintain SpO2 > 92% Continue BD's OOB and ambulate as able Will need outpatient follow up for L lung nodule/ repeat CT .consider biopsy of LUL mass once acute problems are resolved and patient is  less deconditioned Continue to encourage smoking cessation  Acute on chronic pancreatitis with pseudocyst  Status post choleycystectomy Plan: Heart health diet Zofran, simethicone PRN  Anemia - chronic.  No evidence of bleeding Plan: Trend CBC daily Monitor for bleeding    Montey Hora, PA - [facility]  Medicine Pager: [phone]  or [phone] 10/16/2015, 11:50 AM

## 2015-10-16 NOTE — Anesthesia Preprocedure Evaluation (Addendum)
Anesthesia Evaluation  Patient identified by MRN, date of birth, ID band Patient awake    Reviewed: Allergy & Precautions, NPO status , Patient's Chart, lab work & pertinent test results  Airway Mallampati: II  TM Distance: >3 FB Neck ROM: Full    Dental  (+) Edentulous Upper, Edentulous Lower   Pulmonary former smoker,     + decreased breath sounds      Cardiovascular hypertension,  Rhythm:Regular Rate:Tachycardia     Neuro/Psych    GI/Hepatic   Endo/Other    Renal/GU      Musculoskeletal   Abdominal   Peds  Hematology   Anesthesia Other Findings   Reproductive/Obstetrics                            Anesthesia Physical Anesthesia Plan  ASA: IV  Anesthesia Plan: General   Post-op Pain Management:    Induction: Intravenous  Airway Management Planned: Double Lumen EBT  Additional Equipment:   Intra-op Plan:   Post-operative Plan: Extubation in OR  Informed Consent: I have reviewed the patients History and Physical, chart, labs and discussed the procedure including the risks, benefits and alternatives for the proposed anesthesia with the patient or authorized representative who has indicated his/her understanding and acceptance.     Plan Discussed with: CRNA and Anesthesiologist  Anesthesia Plan Comments:         Anesthesia Quick Evaluation

## 2015-10-16 NOTE — Progress Notes (Addendum)
   10/16/15 1031  Vitals  BP (!) 87/70 mmHg  BP Location Right Arm  BP Method Automatic  Patient Position (if appropriate) Lying  Pulse Rate 97   Notified Dr Nelda Marseille about BP. Holding Metoprolol this dose. Dr Nelda Marseille ordered 1 L bolus. Will administer as soon as possible.   1400 BP Re-Check 110/54.

## 2015-10-16 NOTE — Care Management Important Message (Signed)
Important Message  Patient Details  Name: [name] [name] MRN: [id number] Date of Birth: 08/02/1945   Medicare Important Message Given:  Yes    [name] May 10/16/2015, 11:04 AM

## 2015-10-16 NOTE — Progress Notes (Signed)
eLink Physician-Brief Progress Note Patient Name: [name] [name] DOB: November 24, 1945 MRN: [id number]   Date of Service  10/16/2015  HPI/Events of Note  Patient c/o pain.   eICU Interventions  Will increase Tramadol dose to Q 6 hours PRN.     Intervention Category Intermediate Interventions: Pain - evaluation and management  Sommer,Steven Eugene 10/16/2015, 9:03 PM

## 2015-10-16 NOTE — Progress Notes (Addendum)
Name: [name] [name] MRN: [id number] DOB: Jan 01, 1946    ADMISSION DATE:  09/27/2015  CHIEF COMPLAINT:  Dyspnea  BRIEF PATIENT DESCRIPTION: 70 year old female with hydropneumothorax admitted from office for CVTS evaluation.  STUDIES:  7/07 CT Chest >> moderate R hydropneumothorax, abnormal pleural thickening with scattered areas of pleural fluid in the R hemithorax, additional subpleural reticulation / fibrosis with interlobular septal thickening R>L, fluid along the pancreatic body/tail related to known acute pancreatitis 7/03 Cytology from [facility] >> acute inflammation, no malignant cells 7/07 Cytology from [facility] >> no malignant cells  10/22/2015>>Interval increase in the volume of pleural fluid present on the right. Persistent right pleural space gas collections as well consistent with multiloculated hydropneumothoraces. Stable small left pleural effusion. 09/16/2015: Echo>>Normal LV size with mild LV hypertrophy, EF 65-70%. Normal RV  size and systolic function. No significant valvular  abnormalities.   CULTURES:  UC 7/2 >> recollection suggested UC 7/3 >> recollection suggested Pleural Fluid R 7/3 >> negative  Pleural Fluid R 7/6 >>  AUTOIMMUNE: ESR 7/7 >> 45 GBM 7/7 >> 3 ANCA 7/7 >> negative  SUBJECTIVE:  Respiratory status improved.  DOE.  VITAL SIGNS: Temp:  [97.5 F (36.4 C)-98.8 F (37.1 C)] 98.8 F (37.1 C) (07/20 1345) Pulse Rate:  [92-104] 99 (07/20 1345) Resp:  [16-18] 16 (07/20 1345) BP: (87-131)/(54-72) 104/60 mmHg (07/20 1345) SpO2:  [92 %-98 %] 97 % (07/20 1345) Weight:  [66.906 kg (147 lb 8 oz)] 66.906 kg (147 lb 8 oz) (07/20 7517)  Physical Exam:  General- Elderly female OOB in chair wearing nasal oxygen ar 2 L ENT: No sinus tenderness, TM clear, pale nasal mucosa, no oral exudate,no post nasal drip, no LAN Cardiac: S1, S2, regular rate and rhythm, no murmur Chest: No wheeze/+ Right Basilar rales/ Diminished BS per right base; no  accessory muscle use, no nasal flaring, no sternal retractions Abd.: Soft, obese, tender upon palpation Ext: No clubbing cyanosis, trace edema bilaterally to lower extremities Neuro:  MAEx 4, weak and deconditioned Skin: No rashes, warm and dry Psych: depressed and tearful  mood and behavior   Recent Labs Lab 10/04/2015 1436 10/14/15 0633 10/15/15 0548  NA 135 135 135  K 3.1* 3.3* 3.9  CL 97* 97* 96*  CO2 '31 31 29  '$ BUN '12 9 8  '$ CREATININE 0.65 0.59 0.52  GLUCOSE 109* 109* 123*    Recent Labs Lab 09/30/2015 1436 10/14/15 0633 10/15/15 0548  HGB 11.4* 10.2* 10.4*  HCT 35.2* 31.3* 32.0*  WBC 13.8* 10.8* 11.3*  PLT 313 266 299   Dg Chest 2 View  10/15/2015  CLINICAL DATA:  Pleural effusion with pancreatitis. Chest pain, nausea, diaphoresis. EXAM: CHEST  2 VIEW COMPARISON:  10/03/2015 FINDINGS: The heart size and mediastinal contours are within normal limits. Moderate multiloculated right hydro-pneumothorax shows no significant change, with associated compressive right lung atelectasis. Tiny left pleural effusion is mildly increased in size. Mild left basilar atelectasis or scarring is stable. IMPRESSION: No significant change in moderate multiloculated right hydro-pneumothorax and associated compressive atelectasis. Increased size of tiny left pleural effusion. Electronically Signed   By: Earle Gell M.D.   On: 10/15/2015 08:35   I reviewed CXR myself: hydropneumothorax noted.  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 - ANCA-NEGATIVE LUL Small Irregular Mass  Tobacco Abuse - 1/2ppd since  age 46, current smoker. States she quit 6 weeks ago Remains on 2L [place], no distress, dyspnea with exertion P: Appreciate TCTS assist - plan on visit to the OR on Friday. Scheduled BD treatments  as ordered  Continue oxygen at 2L [place] Maintain saturations greater than 92% Trend CXR as needed OOB to chair twice daily IS Q 2 while awake Will need outpatient follow up for L lung nodule/ repeat CT, consider biopsy of LUL mass once acute problems are resolved and patient is less deconditioned Smoking cessation.   CARDIOVASCULAR A:  No Acute Issues P:  Monitor tele bed. VS per unit protocol.  RENAL A:  Hypokalemia  P:   Trend BMET. Monitor and replete electrolytes as needed. K-dur 40 meq PO x1 today.  GASTROINTESTINAL Acute on chronic pancreatitis with pseudocyst   A:   No nausea, vomiting or diarrhea today Better appetite today Continued generalized abdominal tenderness. Status post choleycystectomy P:   Pepcid as prophylaxis Zofran  as needed Simethicone as needed Encourage Ensure as diet supplement until appetite improves NPO after midnight for surgery in AM.  HEMATOLOGIC A:  Anemia       No overt signs of bleeding       Coags WNL P:  Trend CBC daily Monitor for bleeding PAS Hose INR in AM  INFECTIOUS A: Leukocytosis ( Down trending)      Low Grade Temperature  P:  Blood Cultures for Temp > 101.5       Monitor Fever Curve       Consider ABX/culture if WBC trends upwards  ENDOCRINE A:   CBG's slightly elevated   P:   CBGs  NEUROLOGIC A:   No Acute Issues Awake alert and oriented. MAE x 4 Deconditioned/ weak P:   PT consult for conditioning  Discussed with PCCM-NP.  FAMILY  - Updates: Patient updated at length bedside, anticipate surgery in AM.   - Inter-disciplinary family meet or Palliative Care meeting due by:  October 20, 2015.  Rush Farmer, M.D. [facility] Pulmonary/Critical Care Medicine. Pager: [phone]. After hours pager: [phone].

## 2015-10-16 NOTE — Progress Notes (Signed)
   10/16/15 1900  Vitals  BP (!) 120/57 mmHg  MAP (mmHg) 73  BP Location Right Arm  BP Method Automatic  Patient Position (if appropriate) Lying  Pulse Rate (!) 108  Pulse Rate Source Monitor  Oxygen Therapy  SpO2 99 %  O2 Device Room Air  Nurse rounding on pt. Pt flushed in face. Pt stated, "I'm hurting so bad on my right side". Tylenol given. VS taken. No other pain meds available at this time. Paged Dr Nelda Marseille to [phone]. Ginnie Smart made aware and is waiting to hear from dr to update. VS remain stable, as shown above.

## 2015-10-17 ENCOUNTER — Inpatient Hospital Stay (HOSPITAL_COMMUNITY): Payer: Medicare Other

## 2015-10-17 ENCOUNTER — Encounter (HOSPITAL_COMMUNITY): Admission: AD | Disposition: E | Payer: Self-pay | Source: Ambulatory Visit | Attending: Surgery

## 2015-10-17 ENCOUNTER — Inpatient Hospital Stay (HOSPITAL_COMMUNITY): Payer: Medicare Other | Admitting: Certified Registered Nurse Anesthetist

## 2015-10-17 DIAGNOSIS — J869 Pyothorax without fistula: Secondary | ICD-10-CM

## 2015-10-17 DIAGNOSIS — K861 Other chronic pancreatitis: Secondary | ICD-10-CM

## 2015-10-17 DIAGNOSIS — E871 Hypo-osmolality and hyponatremia: Secondary | ICD-10-CM

## 2015-10-17 DIAGNOSIS — D649 Anemia, unspecified: Secondary | ICD-10-CM

## 2015-10-17 HISTORY — PX: PLEURAL EFFUSION DRAINAGE: SHX5099

## 2015-10-17 HISTORY — PX: VIDEO ASSISTED THORACOSCOPY (VATS)/THOROCOTOMY: SHX6173

## 2015-10-17 LAB — GLUCOSE, CAPILLARY
GLUCOSE-CAPILLARY: 139 mg/dL — AB (ref 65–99)
GLUCOSE-CAPILLARY: 165 mg/dL — AB (ref 65–99)
Glucose-Capillary: 138 mg/dL — ABNORMAL HIGH (ref 65–99)

## 2015-10-17 LAB — TYPE AND SCREEN
ABO/RH(D): A POS
Antibody Screen: NEGATIVE

## 2015-10-17 LAB — PROTIME-INR
INR: 1.18 (ref 0.00–1.49)
Prothrombin Time: 15.2 seconds (ref 11.6–15.2)

## 2015-10-17 LAB — BASIC METABOLIC PANEL
Anion gap: 7 (ref 5–15)
BUN: 11 mg/dL (ref 6–20)
CHLORIDE: 99 mmol/L — AB (ref 101–111)
CO2: 28 mmol/L (ref 22–32)
Calcium: 9 mg/dL (ref 8.9–10.3)
Creatinine, Ser: 0.49 mg/dL (ref 0.44–1.00)
GFR calc Af Amer: >60 mL/min (ref >60)
GFR calc non Af Amer: >60 mL/min (ref >60)
GLUCOSE: 115 mg/dL — AB (ref 65–99)
POTASSIUM: 4.2 mmol/L (ref 3.5–5.1)
Sodium: 134 mmol/L — ABNORMAL LOW (ref 135–145)

## 2015-10-17 LAB — CBC
HEMATOCRIT: 34 % — AB (ref 36.0–46.0)
Hemoglobin: 10.9 g/dL — ABNORMAL LOW (ref 12.0–15.0)
MCH: 31.3 pg (ref 26.0–34.0)
MCHC: 32.1 g/dL (ref 30.0–36.0)
MCV: 97.7 fL (ref 78.0–100.0)
Platelets: 324 10*3/uL (ref 150–400)
RBC: 3.48 MIL/uL — ABNORMAL LOW (ref 3.87–5.11)
RDW: 16.7 % — AB (ref 11.5–15.5)
WBC: 14.5 10*3/uL — AB (ref 4.0–10.5)

## 2015-10-17 LAB — GRAM STAIN

## 2015-10-17 LAB — MRSA PCR SCREENING: MRSA by PCR: NEGATIVE

## 2015-10-17 LAB — ABO/RH: ABO/RH(D): A POS

## 2015-10-17 LAB — PHOSPHORUS: Phosphorus: 3.8 mg/dL (ref 2.5–4.6)

## 2015-10-17 LAB — MAGNESIUM: MAGNESIUM: 2.1 mg/dL (ref 1.7–2.4)

## 2015-10-17 SURGERY — VIDEO ASSISTED THORACOSCOPY (VATS)/THOROCOTOMY
Anesthesia: General | Site: Chest | Laterality: Right

## 2015-10-17 MED ORDER — SUGAMMADEX SODIUM 200 MG/2ML IV SOLN
INTRAVENOUS | Status: DC | PRN
  Administered 2015-10-17: 150 mg via INTRAVENOUS

## 2015-10-17 MED ORDER — PHENYLEPHRINE HCL 10 MG/ML IJ SOLN
10.0000 mg | INTRAVENOUS | Status: DC | PRN
  Administered 2015-10-17: 20 ug/min via INTRAVENOUS

## 2015-10-17 MED ORDER — 0.9 % SODIUM CHLORIDE (POUR BTL) OPTIME
TOPICAL | Status: DC | PRN
  Administered 2015-10-17: 2000 mL

## 2015-10-17 MED ORDER — FENTANYL CITRATE (PF) 100 MCG/2ML IJ SOLN
INTRAMUSCULAR | Status: AC
  Administered 2015-10-17: 50 ug via INTRAVENOUS
  Filled 2015-10-17: qty 2

## 2015-10-17 MED ORDER — POTASSIUM CHLORIDE 10 MEQ/50ML IV SOLN
10.0000 meq | Freq: Every day | INTRAVENOUS | Status: DC | PRN
  Administered 2015-10-20 – 2015-10-27 (×7): 10 meq via INTRAVENOUS
  Filled 2015-10-17 (×7): qty 50

## 2015-10-17 MED ORDER — FENTANYL CITRATE (PF) 100 MCG/2ML IJ SOLN
INTRAMUSCULAR | Status: DC | PRN
  Administered 2015-10-17: 50 ug via INTRAVENOUS
  Administered 2015-10-17 (×2): 25 ug via INTRAVENOUS
  Administered 2015-10-17 (×3): 50 ug via INTRAVENOUS

## 2015-10-17 MED ORDER — SUCCINYLCHOLINE 20MG/ML (10ML) SYRINGE FOR MEDFUSION PUMP - OPTIME
INTRAMUSCULAR | Status: DC | PRN
  Administered 2015-10-17: 100 mg via INTRAVENOUS

## 2015-10-17 MED ORDER — ROCURONIUM BROMIDE 100 MG/10ML IV SOLN
INTRAVENOUS | Status: DC | PRN
  Administered 2015-10-17: 50 mg via INTRAVENOUS

## 2015-10-17 MED ORDER — SCOPOLAMINE 1 MG/3DAYS TD PT72
MEDICATED_PATCH | TRANSDERMAL | Status: DC | PRN
  Administered 2015-10-17: 1 via TRANSDERMAL

## 2015-10-17 MED ORDER — ACETAMINOPHEN 500 MG PO TABS
1000.0000 mg | ORAL_TABLET | Freq: Four times a day (QID) | ORAL | Status: AC
  Administered 2015-10-19 – 2015-10-22 (×10): 1000 mg via ORAL
  Filled 2015-10-17 (×10): qty 2

## 2015-10-17 MED ORDER — MIDAZOLAM HCL 5 MG/5ML IJ SOLN
INTRAMUSCULAR | Status: DC | PRN
Start: 2015-10-17 — End: 2015-10-17
  Administered 2015-10-17 (×2): 1 mg via INTRAVENOUS

## 2015-10-17 MED ORDER — DEXTROSE 5 % IV SOLN
1.5000 g | Freq: Two times a day (BID) | INTRAVENOUS | Status: AC
  Administered 2015-10-17 – 2015-10-18 (×2): 1.5 g via INTRAVENOUS
  Filled 2015-10-17 (×2): qty 1.5

## 2015-10-17 MED ORDER — INSULIN ASPART 100 UNIT/ML ~~LOC~~ SOLN
0.0000 [IU] | SUBCUTANEOUS | Status: DC
  Administered 2015-10-17: 4 [IU] via SUBCUTANEOUS
  Administered 2015-10-18: 2 [IU] via SUBCUTANEOUS

## 2015-10-17 MED ORDER — ACETAMINOPHEN 160 MG/5ML PO SOLN
1000.0000 mg | Freq: Four times a day (QID) | ORAL | Status: AC
  Administered 2015-10-17 – 2015-10-19 (×6): 1000 mg via ORAL
  Filled 2015-10-17 (×7): qty 40.6

## 2015-10-17 MED ORDER — MIDAZOLAM HCL 2 MG/2ML IJ SOLN
INTRAMUSCULAR | Status: AC
  Filled 2015-10-17: qty 2

## 2015-10-17 MED ORDER — CEFUROXIME SODIUM 1.5 G IJ SOLR
INTRAMUSCULAR | Status: AC
  Administered 2015-10-17: 1.5 g via INTRAVENOUS
  Filled 2015-10-17: qty 1.5

## 2015-10-17 MED ORDER — ONDANSETRON HCL 4 MG/2ML IJ SOLN: 4.0000 mg | Freq: Once | INTRAMUSCULAR | Status: DC | PRN

## 2015-10-17 MED ORDER — BISACODYL 5 MG PO TBEC
10.0000 mg | DELAYED_RELEASE_TABLET | Freq: Every day | ORAL | Status: DC
  Administered 2015-10-18 – 2015-10-28 (×7): 10 mg via ORAL
  Filled 2015-10-17 (×8): qty 2

## 2015-10-17 MED ORDER — FENTANYL CITRATE (PF) 100 MCG/2ML IJ SOLN
25.0000 ug | INTRAMUSCULAR | Status: DC | PRN
  Administered 2015-10-17 – 2015-10-26 (×52): 50 ug via INTRAVENOUS
  Filled 2015-10-17 (×49): qty 2
  Filled 2015-10-17: qty 1
  Filled 2015-10-17 (×6): qty 2

## 2015-10-17 MED ORDER — LACTATED RINGERS IV SOLN
INTRAVENOUS | Status: DC | PRN
  Administered 2015-10-17: 07:00:00 via INTRAVENOUS

## 2015-10-17 MED ORDER — LEVALBUTEROL HCL 0.63 MG/3ML IN NEBU
0.6300 mg | INHALATION_SOLUTION | Freq: Four times a day (QID) | RESPIRATORY_TRACT | Status: DC
Start: 2015-10-17 — End: 2015-10-18
  Administered 2015-10-17 (×2): 0.63 mg via RESPIRATORY_TRACT
  Filled 2015-10-17 (×2): qty 3

## 2015-10-17 MED ORDER — FENTANYL CITRATE (PF) 100 MCG/2ML IJ SOLN
25.0000 ug | INTRAMUSCULAR | Status: DC | PRN
  Administered 2015-10-17 (×3): 50 ug via INTRAVENOUS

## 2015-10-17 MED ORDER — PROPOFOL 10 MG/ML IV BOLUS
INTRAVENOUS | Status: DC | PRN
  Administered 2015-10-17: 140 mg via INTRAVENOUS

## 2015-10-17 MED ORDER — ONDANSETRON HCL 4 MG/2ML IJ SOLN: 4.0000 mg | Freq: Four times a day (QID) | INTRAMUSCULAR | Status: DC | PRN

## 2015-10-17 MED ORDER — SCOPOLAMINE 1 MG/3DAYS TD PT72
MEDICATED_PATCH | TRANSDERMAL | Status: AC
  Filled 2015-10-17: qty 1

## 2015-10-17 MED ORDER — ARTIFICIAL TEARS OP OINT
TOPICAL_OINTMENT | OPHTHALMIC | Status: DC | PRN
  Administered 2015-10-17: 1 via OPHTHALMIC

## 2015-10-17 MED ORDER — PROPOFOL 10 MG/ML IV BOLUS
INTRAVENOUS | Status: AC
  Filled 2015-10-17: qty 20

## 2015-10-17 MED ORDER — DEXAMETHASONE SODIUM PHOSPHATE 10 MG/ML IJ SOLN
INTRAMUSCULAR | Status: DC | PRN
  Administered 2015-10-17: 4 mg via INTRAVENOUS

## 2015-10-17 MED ORDER — FENTANYL CITRATE (PF) 250 MCG/5ML IJ SOLN
INTRAMUSCULAR | Status: AC
  Filled 2015-10-17: qty 5

## 2015-10-17 MED ORDER — SENNOSIDES-DOCUSATE SODIUM 8.6-50 MG PO TABS
1.0000 | ORAL_TABLET | Freq: Every day | ORAL | Status: DC
  Administered 2015-10-17 – 2015-10-18 (×2): 1 via ORAL
  Filled 2015-10-17 (×2): qty 1

## 2015-10-17 MED ORDER — DEXTROSE-NACL 5-0.9 % IV SOLN
INTRAVENOUS | Status: DC
  Administered 2015-10-17 – 2015-10-27 (×8): via INTRAVENOUS

## 2015-10-17 MED ORDER — CEFAZOLIN SODIUM-DEXTROSE 2-4 GM/100ML-% IV SOLN
INTRAVENOUS | Status: AC
  Filled 2015-10-17: qty 100

## 2015-10-17 SURGICAL SUPPLY — 64 items
ADH SKN CLS APL DERMABOND .7 (GAUZE/BANDAGES/DRESSINGS) ×2
APL SRG 22X2 LUM MLBL SLNT (VASCULAR PRODUCTS)
APPLICATOR TIP EXT COSEAL (VASCULAR PRODUCTS) IMPLANT
CANISTER SUCTION 2500CC (MISCELLANEOUS) ×3 IMPLANT
CATH KIT ON Q 5IN SLV (PAIN MANAGEMENT) IMPLANT
CATH THORACIC 28FR (CATHETERS) IMPLANT
CATH THORACIC 36FR (CATHETERS) IMPLANT
CATH THORACIC 36FR RT ANG (CATHETERS) IMPLANT
CLIP TI MEDIUM 6 (CLIP) ×3 IMPLANT
CONT SPEC 4OZ CLIKSEAL STRL BL (MISCELLANEOUS) ×8 IMPLANT
COVER SURGICAL LIGHT HANDLE (MISCELLANEOUS) ×3 IMPLANT
DERMABOND ADVANCED (GAUZE/BANDAGES/DRESSINGS) ×1
DERMABOND ADVANCED .7 DNX12 (GAUZE/BANDAGES/DRESSINGS) IMPLANT
DRAPE LAPAROSCOPIC ABDOMINAL (DRAPES) ×3 IMPLANT
DRAPE WARM FLUID 44X44 (DRAPE) ×5 IMPLANT
ELECT BLADE 4.0 EZ CLEAN MEGAD (MISCELLANEOUS) ×3
ELECT REM PT RETURN 9FT ADLT (ELECTROSURGICAL) ×3
ELECTRODE BLDE 4.0 EZ CLN MEGD (MISCELLANEOUS) IMPLANT
ELECTRODE REM PT RTRN 9FT ADLT (ELECTROSURGICAL) ×2 IMPLANT
GAUZE SPONGE 4X4 12PLY STRL (GAUZE/BANDAGES/DRESSINGS) ×3 IMPLANT
GLOVE BIOGEL PI IND STRL 6.5 (GLOVE) IMPLANT
GLOVE BIOGEL PI INDICATOR 6.5 (GLOVE) ×1
GLOVE EUDERMIC 7 POWDERFREE (GLOVE) ×6 IMPLANT
GOWN STRL REUS W/ TWL LRG LVL3 (GOWN DISPOSABLE) ×4 IMPLANT
GOWN STRL REUS W/ TWL XL LVL3 (GOWN DISPOSABLE) ×2 IMPLANT
GOWN STRL REUS W/TWL LRG LVL3 (GOWN DISPOSABLE) ×6
GOWN STRL REUS W/TWL XL LVL3 (GOWN DISPOSABLE) ×3
HEMOSTAT SURGICEL 2X14 (HEMOSTASIS) IMPLANT
KIT BASIN OR (CUSTOM PROCEDURE TRAY) ×3 IMPLANT
KIT ROOM TURNOVER OR (KITS) ×3 IMPLANT
NS IRRIG 1000ML POUR BTL (IV SOLUTION) ×10 IMPLANT
PACK CHEST (CUSTOM PROCEDURE TRAY) ×3 IMPLANT
PAD ARMBOARD 7.5X6 YLW CONV (MISCELLANEOUS) ×6 IMPLANT
SEALANT SURG COSEAL 4ML (VASCULAR PRODUCTS) IMPLANT
SEALANT SURG COSEAL 8ML (VASCULAR PRODUCTS) IMPLANT
SOLUTION ANTI FOG 6CC (MISCELLANEOUS) ×3 IMPLANT
SPONGE GAUZE 4X4 12PLY STER LF (GAUZE/BANDAGES/DRESSINGS) ×1 IMPLANT
SUT PROLENE 3 0 SH DA (SUTURE) IMPLANT
SUT PROLENE 4 0 RB 1 (SUTURE)
SUT PROLENE 4-0 RB1 .5 CRCL 36 (SUTURE) IMPLANT
SUT SILK  1 MH (SUTURE) ×2
SUT SILK 1 MH (SUTURE) ×4 IMPLANT
SUT SILK 1 TIES 10X30 (SUTURE) IMPLANT
SUT SILK 2 0 SH (SUTURE) IMPLANT
SUT SILK 2 0SH CR/8 30 (SUTURE) IMPLANT
SUT VIC AB 1 CTX 18 (SUTURE) ×3 IMPLANT
SUT VIC AB 1 CTX 36 (SUTURE)
SUT VIC AB 1 CTX36XBRD ANBCTR (SUTURE) IMPLANT
SUT VIC AB 2-0 CTX 36 (SUTURE) ×4 IMPLANT
SUT VIC AB 2-0 UR6 27 (SUTURE) ×1 IMPLANT
SUT VIC AB 3-0 MH 27 (SUTURE) IMPLANT
SUT VIC AB 3-0 X1 27 (SUTURE) ×5 IMPLANT
SUT VICRYL 2 TP 1 (SUTURE) IMPLANT
SWAB COLLECTION DEVICE MRSA (MISCELLANEOUS) ×2 IMPLANT
SYSTEM SAHARA CHEST DRAIN RE-I (WOUND CARE) ×3 IMPLANT
TAPE CLOTH 4X10 WHT NS (GAUZE/BANDAGES/DRESSINGS) ×3 IMPLANT
TAPE CLOTH SURG 4X10 WHT LF (GAUZE/BANDAGES/DRESSINGS) ×1 IMPLANT
TIP APPLICATOR SPRAY EXTEND 16 (VASCULAR PRODUCTS) IMPLANT
TOWEL OR 17X24 6PK STRL BLUE (TOWEL DISPOSABLE) ×6 IMPLANT
TOWEL OR 17X26 10 PK STRL BLUE (TOWEL DISPOSABLE) ×6 IMPLANT
TRAP SPECIMEN MUCOUS 40CC (MISCELLANEOUS) ×1 IMPLANT
TRAY FOLEY CATH 14FRSI W/METER (CATHETERS) ×3 IMPLANT
TUBE ANAEROBIC SPECIMEN COL (MISCELLANEOUS) ×2 IMPLANT
WATER STERILE IRR 1000ML POUR (IV SOLUTION) ×3 IMPLANT

## 2015-10-17 NOTE — Transfer of Care (Signed)
Immediate Anesthesia Transfer of Care Note  Patient: [name] [name]  Procedure(s) Performed: Procedure(s): VIDEO ASSISTED THORACOSCOPY (VATS) with Drainage of Pleural Effusion (Right) DRAINAGE OF PLEURAL EFFUSION (Right)  Patient Location: PACU  Anesthesia Type:General  Level of Consciousness: awake, alert , oriented and patient cooperative  Airway & Oxygen Therapy: Patient connected to nasal cannula oxygen  Post-op Assessment: Report given to RN and Post -op Vital signs reviewed and stable  Post vital signs: Reviewed and stable  Last Vitals:  Filed Vitals:   10/16/15 2307 10/11/2015 0514  BP: 120/70 115/69  Pulse: 109 107  Temp:  36.8 C  Resp:  18    Last Pain:  Filed Vitals:   10/18/2015 0525  PainSc: Asleep      Patients Stated Pain Goal: 0 ([id number])  Complications: No apparent anesthesia complications

## 2015-10-17 NOTE — Anesthesia Procedure Notes (Signed)
Procedure Name: Intubation Date/Time: 10/14/2015 7:49 AM Performed by: Oletta Lamas Pre-anesthesia Checklist: Patient identified, Emergency Drugs available, Suction available and Patient being monitored Patient Re-evaluated:Patient Re-evaluated prior to inductionOxygen Delivery Method: Circle System Utilized Preoxygenation: Pre-oxygenation with 100% oxygen Intubation Type: IV induction Laryngoscope Size: Mac and 3 Grade View: Grade I Tube type: Oral Endobronchial tube: Double lumen EBT, Left, EBT position confirmed by auscultation and EBT position confirmed by fiberoptic bronchoscope and 35 Fr Number of attempts: 1 Airway Equipment and Method: Stylet Placement Confirmation: ETT inserted through vocal cords under direct vision,  positive ETCO2 and breath sounds checked- equal and bilateral Secured at: 30 cm Tube secured with: Tape Dental Injury: Teeth and Oropharynx as per pre-operative assessment

## 2015-10-17 NOTE — Brief Op Note (Signed)
10/26/2015 - 10/16/2015  9:19 AM  PATIENT:  [name] [name]  70 y.o. female  PRE-OPERATIVE DIAGNOSIS:  LOCULATED RIGHT PLEURAL EFFUSION  POST-OPERATIVE DIAGNOSIS:  LOCULATED RIGHT PLEURAL EFFUSION  PROCEDURE:  Procedure(s): VIDEO ASSISTED THORACOSCOPY (VATS) with Drainage of Pleural Effusion (Right) DRAINAGE OF PLEURAL EFFUSION (Right)  SURGEON:  Surgeon(s) and Role:    * Gaye Pollack, MD - Primary  PHYSICIAN ASSISTANT: none  ASSISTANTS: Waldron Labs, RN  ANESTHESIA:   general  EBL:  Total I/O In: 0  Out: 150 [Urine:100; Blood:50]  BLOOD ADMINISTERED:none  DRAINS: 1 21F Chest Tube(s) in the right pleural space   LOCAL MEDICATIONS USED:  NONE  SPECIMEN:  Source of Specimen:  right pleural fluid for culture, pleural biopsies for culture and pathology  DISPOSITION OF SPECIMEN:  pathology and micro  COUNTS:  YES  TOURNIQUET:  * No tourniquets in log *  DICTATION: .Note written in EPIC  PLAN OF CARE: Admit to inpatient   PATIENT DISPOSITION:  PACU - hemodynamically stable.   Delay start of Pharmacological VTE agent (>24hrs) due to surgical blood loss or risk of bleeding: yes

## 2015-10-17 NOTE — Care Management Note (Signed)
Case Management Note  Patient Details  Name: [name] [name] MRN: [id number] Date of Birth: 1946-02-09  Subjective/Objective:      Patient is s/p vats with drainage of pl effusion.  Patient is active with [facility] for HHRN/HHPT.  NCM will cont to follow for  Post op dc  Needs.             Action/Plan:   Expected Discharge Date:                  Expected Discharge Plan:  [place]  In-House Referral:     Discharge planning Services  CM Consult  Post Acute Care Choice:  Resumption of Svcs/PTA Provider Choice offered to:  Patient  DME Arranged:  Oxygen DME Agency:  [place]. (home oxygen)  HH Arranged:  RN, PT HH Agency:     Status of Service:  In process, will continue to follow  If discussed at Long Length of Stay Meetings, dates discussed:    Additional Comments:  Zenon Mayo, RN 09/28/2015, 1:36 PM

## 2015-10-17 NOTE — Op Note (Signed)
CARDIOTHORACIC SURGERY OPERATIVE NOTE:  [name] [name] [id number] 09/28/2015   Preoperative Dx:  Large right loculated hydropneumothorax  Postoperative Dx: Right empyema   Procedure: Right video-assisted thoracoscopy, drainage of empyema  Surgeon: Dr. Alleen Borne   Assistant: Brennan Bailey, RN  Anesthesia: GET   Clinical History:   The patient is a 70 year old woman who apparently had microscopic hematuria evaluated by urology in early 2017 and a CT of the abdomen in March 2017 showed a 3.5 cm RLL lung cavitary mass. A PET scan on 07/07/2015 showed the lesion to be hypermetabolic with an SUV max of 10.0. There were multiple areas of right sided hypermetabolic pleural thickening and nodularity suspicious for metastatic disease. There was also left posterior 7th rib hypermetabolism and probable lucency suspicious for isolated bone met. She underwent a CT guided bx of the RLL lung lesion on 4/20 which was negative for malignancy but was felt to show vasculitic features. 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 She was treated with high dose prednisone and Rituxan. She started having abdominal pain and a CT of the abdomen on 6/27 showed the RLL lung mass to be slightly smaller but obscured by a small right pleural effusion. She was also diagnosed with acute pancreatitis with a small pseudocyst which she feels was due to the prednisone. She was admitted on 7/2 and underwent right thoracentesis 7/3 removing 1.4 L of yellow serous fluid showing an exudative effusion. A repeat thoracentesis on 7/6 removed 1L of the same fluid which was also exudative. She was admitted  with worsening dyspnea and a CXR showed an enlarging right hydropneumothorax. I think she will now require a VATS or possibly a small thoracotomy to drain the loculated effusion effectively. I discussed the procedure with her and her two daughters including alternatives, benefits and  risks including but not limited to bleeding, infection, lung injury, recurrent effusion and they understand and agree to proceed.  Operative procedure:   The patient was seen in the preoperative holding area. The proper patient, proper operative side, proper operation were confirmed after reviewing his history and chest x-ray. The right side of the chest was signed by me. Preoperative intravenous antibiotics were given. She was taken back to the operating room and placed on table in the supine position. After induction of general endotracheal anesthesia using a double-lumen tube, a Foley catheter was placed in the bladder using sterile technique. Lower extremity sequential compression devices were used. The patient was positioned in the left lateral decubitus position with the right side up. The right side of the chest was prepped with Betadine soap and solution and draped in the usual sterile manner. A timeout was taken and the proper patient, proper operation, and proper operative side were confirmed with nursing and anesthesia staff. A 1 cm incision was made in the midaxillary line at about the eighth intercostal space. Another 1 cm incision was made in the posterior axillary line about the 6th ICS. The left pleural space was entered bluntly with a hemostat and an 8 mm trocar was inserted. 800 cc of cloudy serous fluid was removed and samples sent for culture. The 30 thoracoscope was inserted and the pleural space inspected. There was inflammation of all pleural surfaces which were covered diffusely with exudate. I took biopsies of the parietal pleura and sent some for culture and some to pathology. The lung was inflamed, boggy and friable and stuck firmly to the chest wall anteriorly. I did  not biopsy the lung because I was concerned about healing and prolonged air leak. I did not see any distinct nodules or masses in the lung or parietal pleural surface. Then a 9 French chest tube was placed. This was  fixed to the skin with silk sutures. The lung was reinflated under direct vision and I did not see any significant air leak. Hemostasis appeared adequate. The posterior axillary incision was then closed in layers using a 2-0 Vicryl subcutaneous suture and 3-0 Vicryl subcuticular skin closure. The chest tube was connected to Pleur-evac suction. The sponge needle and instrument counts were correct according to the scrub nurse. The patient was then turned into the supine position, extubated, and transported to the post anesthesia care unit in satisfactory and stable condition.

## 2015-10-17 NOTE — Anesthesia Postprocedure Evaluation (Signed)
Anesthesia Post Note  Patient: [name] [name]  Procedure(s) Performed: Procedure(s) (LRB): VIDEO ASSISTED THORACOSCOPY (VATS) with Drainage of Pleural Effusion (Right) DRAINAGE OF PLEURAL EFFUSION (Right)  Patient location during evaluation: PACU Anesthesia Type: General Level of consciousness: awake, awake and alert and oriented Pain management: pain level controlled Vital Signs Assessment: post-procedure vital signs reviewed and stable Respiratory status: spontaneous breathing and nonlabored ventilation Cardiovascular status: blood pressure returned to baseline Anesthetic complications: no    Last Vitals:  Filed Vitals:   10/12/2015 1327 10/01/2015 1411  BP: 128/66   Pulse: 110 108  Temp: 36.6 C   Resp:  22    Last Pain:  Filed Vitals:   10/26/2015 1532  PainSc: Asleep                 Carinna Newhart COKER

## 2015-10-17 NOTE — Progress Notes (Signed)
 Name: [name] [name] MRN: [id number] DOB: 12/14/1945    ADMISSION DATE:  10/16/2015  CHIEF COMPLAINT:  Dyspnea  BRIEF PATIENT DESCRIPTION: 70 year old female with hydropneumothorax admitted from office for CVTS evaluation.  STUDIES:  09/16/15 TTE: Normal LV size with mild LV hypertrophy, EF 65-70%. Normal RV size and systolic function. No significant valvular abnormalities. 7/07 CT Chest: moderate R hydropneumothorax, abnormal pleural thickening with scattered areas of pleural fluid in the R hemithorax, additional subpleural reticulation / fibrosis with interlobular septal thickening R>L, fluid along the pancreatic body/tail related to known acute pancreatitis 7/03 Cytology from [facility]: acute inflammation, no malignant cells 7/07 Cytology from [facility]: no malignant cells  7/21 PORT CXR:  Right chest tube in good position. R IJ CVL overlying SVC. Mild blunting of right costophrenic angle. Loculated right basilar pneumothorax.  MICROBIOLOGY:  Urine Ctx 7/2:  Recommended recollection Urine Ctx 7/3:  Recommended recollection R Pleural Fluid 7/3:  Negative R Pleural Fluid 7/6:  Negative MRSA PCR 7/20:  Negative R Pleural Fluid 7/21>>> R Parietal Pleural Biopsy 7/21>>>  ANTIBIOTICS: Cefuroxime 7/20 - 7/21 (surgical prophylaxis)  AUTOIMMUNE: ESR 7/7: 45 GBM 7/7: 3 ANCA 7/7: negative  RIGHT PLEURAL FLUID:  7/6 Glucose:  72 LDH:  336 Total Protein:  <3.0  7/3 Glucose:  100 LDH:  403 Total Protein:  <3.0 Amylase:  26 WBC:  341 (40% lymph, 1% eos, 26% neutro, & 23% mono/macro)  SUBJECTIVE: Patient seen post-operative after VATS and chest tube placement. Patient reporting pain from chest tube and surgical sites. Denies any dyspnea or significant coughing. No chest pain otherwise.  REVIEW OF SYSTEMS:  No nausea or abdominal pain. Reports some mild sweating but no subjective fever or chills. No headache or vision changes.  VITAL SIGNS: Temp:  [98 F (36.7 C)-98.6 F  (37 C)] 98.5 F (36.9 C) (07/21 1300) Pulse Rate:  [107-114] 110 (07/21 1327) Resp:  [14-22] 21 (07/21 1300) BP: (112-150)/(57-98) 128/66 mmHg (07/21 1327) SpO2:  [95 %-99 %] 97 % (07/21 1327) Arterial Line BP: (80-171)/(66-109) 127/66 mmHg (07/21 1332) Weight:  [144 lb 4.8 oz (65.454 kg)] 144 lb 4.8 oz (65.454 kg) (07/21 0514)  Physical Exam:  General: Elderly female. No acute distress. Laying in bed eyes closed. HEENT: Moist mucus membranes. No scleral injection or icterus. Pupils symmetric.  Cardiac: Tachycardic. Regular rhythm & sinus on telemetry. No edema. Pulmonary: Clear bilaterally to auscultation. Normal work of breathing on nasal cannula. Right chest tube in place with respiratory tidaling.  Abdomen:  Soft. Nondistended. Nontender. Normal bowel sounds. Neurological:  CN in tact. No meningismus. Grossly nonfocal. Integument:  Warm & dry. No rash on exposed skin.   Recent Labs Lab 10/14/15 0633 10/15/15 0548 10/16/2015 0555  NA 135 135 134*  K 3.3* 3.9 4.2  CL 97* 96* 99*  CO2 31 29 28  BUN 9 8 11  CREATININE 0.59 0.52 0.49  GLUCOSE 109* 123* 115*    Recent Labs Lab 10/14/15 0633 10/15/15 0548 09/28/2015 0555  HGB 10.2* 10.4* 10.9*  HCT 31.3* 32.0* 34.0*  WBC 10.8* 11.3* 14.5*  PLT 266 299 324   Dg Chest Port 1 View  10/26/2015  CLINICAL DATA:  Status post VATS. EXAM: PORTABLE CHEST 1 VIEW COMPARISON:  10/15/2015 FINDINGS: Interval VATS procedure. Right sided chest tube in satisfactory position. Small loculated right basilar pneumothorax. No right pleural effusion. Small left pleural effusion. Bilateral mild interstitial thickening. No focal consolidation. Stable cardiomediastinal silhouette. Right jugular central venous catheter with   the tip projecting over the SVC. The osseous structures are unremarkable. IMPRESSION: 1. Interval VATS procedure. Right sided chest tube in satisfactory position. Small loculated right basilar pneumothorax. Electronically Signed   By:  Hetal  Patel   On: 09/28/2015 10:23   ASSESSMENT / PLAN:  70 year old female with recurrent right exudative pleural effusion and acute on chronic pancreatitis with pseudocyst formation. Patient underwent VATS today with culture and parietal pleural biopsy. Successfully extubated post-operative and transferred to the stepdown unit for further care.   1. Recurrent Right Exudative Pleural Effusion:  Likely secondary to pancreatitis. S/P VATS POD #0. Following culture and pathology from VATS. Continuing perioperative prophylactic antibiotics.  2. Post-Operative Surgical Pain:  S/P VATS POD #0. Tylenol PO q6hr & prn. Fentanyl IV prn. Ultram PO prn. 3. Loculated Right Pneumothorax:  Chest tube in place s/p VATS. 4. Right Lower Lobe & Left Upper Lobe Masses:  ANCA negative. 5. Acute Hypoxic Respiratory Failure:  Multi-factorial. Continuing incentive spirometry while awake. OOB to chair. Weaning FiO2 for Sat >92%. 6. 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 Encouraging Ensure supplementation until appetite improves. 7. Anemia:  No signs of active bleeding & Hgb stable. Plan to transfuse for Hgb <7.0 or signs of active bleeding. Trending cell counts daily w/ CBC. 8. Leukocytosis:  Likely secondary to stress response. Trending cell counts daily w/ CBC. 9. Asthma vs COPD:  No signs of exacerbation.  Continuing Breo in place of home Dulera. Continuing Xopenex nebs q6hr scheduled. 10. Hyponatremia:  Mild. Continuing to monitor electrolytes daily.  11. Hyperglycemia:  No h/o DM. Continuing Accu-Checks q4hr w/ SSI per algorithm. Hgb A1c 5.9 (7/17). 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 13. Hyperlipidemia:  Not currently on home medication. Not initiating statin therapy.  14. Coronary Artery Disease:  Continuing ASA 81mg daily & Toprol XL. 15. Depression:  Takes Valium at home. Monitor for signs of withdrawal. 16. Hypertension:  BP controlled. Continuing Toprol XL 25mg  qhs. 17. Ongoing Tobacco Use:  Plan for tobacco cessation education prior to discharge. 18. Prophylaxis:  Pepcid PO daily. SCDs. 19. Diet:  Clear liquid diet & advance as tolerated. 20. Disposition:  Patient to remain in SDU post-operatively. PT consulted & following.  FAMILY  - Updates: Patient updated by Dr. Nestor at bedside 7/21.  - Inter-disciplinary family meet or Palliative Care meeting due by:  7/24  Jennings E. Nestor, M.D. [facility] Pager:  [phone] After 3pm or if no response, call [phone] 2:09 PM 09/28/2015  

## 2015-10-17 NOTE — Progress Notes (Signed)
Paged [facility] for patient to travel to OR without telemetry. Verbal order given to RN by Benjamine Mola Deterding,MD from [place].

## 2015-10-17 NOTE — Progress Notes (Signed)
Procedure(s) (LRB): VIDEO ASSISTED THORACOSCOPY (VATS)/POSSIBLE THOROCOTOMY (Right) DRAINAGE OF PLEURAL EFFUSION (Right) Subjective: Complains of back and abdominal pain  Objective: Vital signs in last 24 hours: Temp:  [97.5 F (36.4 C)-98.8 F (37.1 C)] 98.6 F (37 C) (07/20 2101) Pulse Rate:  [92-112] 109 (07/20 2307) Cardiac Rhythm:  [-] Sinus tachycardia (07/20 1915) Resp:  [16-18] 18 (07/20 2101) BP: (87-122)/(54-73) 120/70 mmHg (07/20 2307) SpO2:  [92 %-99 %] 97 % (07/20 2307) Weight:  [66.906 kg (147 lb 8 oz)] 66.906 kg (147 lb 8 oz) (07/20 0552)  Hemodynamic parameters for last 24 hours:    Intake/Output from previous day: 07/20 0701 - 07/21 0700 In: 343 [P.O.:340; I.V.:3] Out: 1050 [Urine:1050] Intake/Output this shift: Total I/O In: 3 [I.V.:3] Out: 300 [Urine:300]  General appearance: alert and cooperative Heart: regular rate and rhythm, S1, S2 normal, no murmur, click, rub or gallop Lungs: diminished breath sounds RLL  Lab Results:  Recent Labs  10/14/15 0633 10/15/15 0548  WBC 10.8* 11.3*  HGB 10.2* 10.4*  HCT 31.3* 32.0*  PLT 266 299   BMET:  Recent Labs  10/14/15 0633 10/15/15 0548  NA 135 135  K 3.3* 3.9  CL 97* 96*  CO2 31 29  GLUCOSE 109* 123*  BUN 9 8  CREATININE 0.59 0.52  CALCIUM 8.6* 8.9    PT/INR: No results for input(s): LABPROT, INR in the last 72 hours. ABG    Component Value Date/Time   PHART 7.473* 10/05/2015 2223   HCO3 26.7* 10/05/2015 2223   TCO2 24.2 10/05/2015 2223   O2SAT 92.2 10/05/2015 2223   CBG (last 3)   Recent Labs  10/14/15 0752 10/15/15 0751 10/16/15 0752  GLUCAP 113* 128* 121*    Assessment/Plan:  Large loculated right pleural effusion/empyema. Plan VATS or thoracotomy for drainage this morning. Patient is in agreement to proceed.  LOS: 4 days    Gaye Pollack 10/05/2015

## 2015-10-18 ENCOUNTER — Inpatient Hospital Stay (HOSPITAL_COMMUNITY): Payer: Medicare Other

## 2015-10-18 LAB — CBC WITH DIFFERENTIAL/PLATELET
Basophils Absolute: 0 10*3/uL (ref 0.0–0.1)
Basophils Relative: 0 %
Eosinophils Absolute: 0.2 10*3/uL (ref 0.0–0.7)
Eosinophils Relative: 1 %
HEMATOCRIT: 30.4 % — AB (ref 36.0–46.0)
Hemoglobin: 9.6 g/dL — ABNORMAL LOW (ref 12.0–15.0)
LYMPHS ABS: 1.4 10*3/uL (ref 0.7–4.0)
LYMPHS PCT: 12 %
MCH: 31.3 pg (ref 26.0–34.0)
MCHC: 31.6 g/dL (ref 30.0–36.0)
MCV: 99 fL (ref 78.0–100.0)
MONO ABS: 1.8 10*3/uL — AB (ref 0.1–1.0)
MONOS PCT: 14 %
NEUTROS ABS: 8.9 10*3/uL — AB (ref 1.7–7.7)
Neutrophils Relative %: 73 %
Platelets: 284 10*3/uL (ref 150–400)
RBC: 3.07 MIL/uL — ABNORMAL LOW (ref 3.87–5.11)
RDW: 16.6 % — AB (ref 11.5–15.5)
WBC: 12.3 10*3/uL — ABNORMAL HIGH (ref 4.0–10.5)

## 2015-10-18 LAB — RENAL FUNCTION PANEL
Albumin: 2 g/dL — ABNORMAL LOW (ref 3.5–5.0)
Anion gap: 6 (ref 5–15)
BUN: 10 mg/dL (ref 6–20)
CHLORIDE: 103 mmol/L (ref 101–111)
CO2: 28 mmol/L (ref 22–32)
Calcium: 8.4 mg/dL — ABNORMAL LOW (ref 8.9–10.3)
Creatinine, Ser: 0.45 mg/dL (ref 0.44–1.00)
GFR calc Af Amer: >60 mL/min (ref >60)
GFR calc non Af Amer: >60 mL/min (ref >60)
GLUCOSE: 112 mg/dL — AB (ref 65–99)
POTASSIUM: 3.6 mmol/L (ref 3.5–5.1)
Phosphorus: 3.9 mg/dL (ref 2.5–4.6)
Sodium: 137 mmol/L (ref 135–145)

## 2015-10-18 LAB — GLUCOSE, CAPILLARY
GLUCOSE-CAPILLARY: 99 mg/dL (ref 65–99)
GLUCOSE-CAPILLARY: 160 mg/dL — AB (ref 65–99)
Glucose-Capillary: 104 mg/dL — ABNORMAL HIGH (ref 65–99)
Glucose-Capillary: 93 mg/dL (ref 65–99)

## 2015-10-18 LAB — MAGNESIUM: Magnesium: 2 mg/dL (ref 1.7–2.4)

## 2015-10-18 MED ORDER — ALUM & MAG HYDROXIDE-SIMETH 200-200-20 MG/5ML PO SUSP
30.0000 mL | ORAL | Status: DC | PRN
  Administered 2015-10-18 – 2015-11-01 (×9): 30 mL via ORAL
  Filled 2015-10-18 (×10): qty 30

## 2015-10-18 MED ORDER — LEVALBUTEROL HCL 0.63 MG/3ML IN NEBU
0.6300 mg | INHALATION_SOLUTION | Freq: Three times a day (TID) | RESPIRATORY_TRACT | Status: DC
Start: 2015-10-18 — End: 2015-10-21
  Administered 2015-10-18 – 2015-10-21 (×7): 0.63 mg via RESPIRATORY_TRACT
  Filled 2015-10-18 (×11): qty 3

## 2015-10-18 MED ORDER — LEVALBUTEROL HCL 0.63 MG/3ML IN NEBU
0.6300 mg | INHALATION_SOLUTION | Freq: Four times a day (QID) | RESPIRATORY_TRACT | Status: DC | PRN
  Administered 2015-10-22: 0.63 mg via RESPIRATORY_TRACT
  Filled 2015-10-18: qty 3

## 2015-10-18 NOTE — Progress Notes (Addendum)
      [address]       [place] [address]             [phone]       1 Day Post-Op Procedure(s) (LRB): VIDEO ASSISTED THORACOSCOPY (VATS) with Drainage of Pleural Effusion (Right) DRAINAGE OF PLEURAL EFFUSION (Right)   Subjective:  Ms. Fleming complains of pain at arterial line site.  She otherwise is doing okay.  She denies nausea/vomiting.  Surgical pain is well controlled.  Objective: Vital signs in last 24 hours: Temp:  [97.9 F (36.6 C)-98.5 F (36.9 C)] 97.9 F (36.6 C) (07/22 0814) Pulse Rate:  [84-114] 92 (07/22 0814) Cardiac Rhythm:  [-] Normal sinus rhythm (07/22 0800) Resp:  [14-25] 23 (07/22 0814) BP: (118-135)/(64-98) 118/65 mmHg (07/22 0814) SpO2:  [95 %-100 %] 100 % (07/22 0814) Arterial Line BP: (80-153)/(66-109) 127/66 mmHg (07/21 1332)  Intake/Output from previous day: 07/21 0701 - 07/22 0700 In: 2220 [P.O.:120; I.V.:2100] Out: 1885 [id number]; Blood:50; Chest Tube:260] Intake/Output this shift: Total I/O In: -  Out: 225 [Urine:225]  General appearance: alert, cooperative and no distress Heart: regular rate and rhythm Lungs: clear to auscultation bilaterally Abdomen: soft, non-tender; bowel sounds normal; no masses,  no organomegaly Wound: clean and dry  Lab Results:  Recent Labs  10/02/2015 0555 10/18/15 0600  WBC 14.5* 12.3*  HGB 10.9* 9.6*  HCT 34.0* 30.4*  PLT 324 284   BMET:  Recent Labs  10/27/2015 0555 10/18/15 0600  NA 134* 137  K 4.2 3.6  CL 99* 103  CO2 28 28  GLUCOSE 115* 112*  BUN 11 10  CREATININE 0.49 0.45  CALCIUM 9.0 8.4*    PT/INR:  Recent Labs  10/09/2015 0555  LABPROT 15.2  INR 1.18   ABG    Component Value Date/Time   PHART 7.473* 10/05/2015 2223   HCO3 26.7* 10/05/2015 2223   TCO2 24.2 10/05/2015 2223   O2SAT 92.2 10/05/2015 2223   CBG (last 3)   Recent Labs  10/22/2015 2340 10/18/15 0407 10/18/15 0814  GLUCAP 104* 99 160*    Assessment/Plan: S/P Procedure(s)  (LRB): VIDEO ASSISTED THORACOSCOPY (VATS) with Drainage of Pleural Effusion (Right) DRAINAGE OF PLEURAL EFFUSION (Right)  1. Chest tube- intermittent air leak, 260 cc output since surgery- will leave on suction today 2. Pulm- wean oxygen as tolerated, CXR shows stable appearance of right basilar pneumothorax, continue IS 3. CV- H/O HTN- on home BP regimen, monitor 4. D/C Arterial line 5. Place IV Fluids to KVO 6. Dispo- patient stable, chest tube on suction today, repeat CXR in AM   LOS: 5 days    Ellwood Handler 10/18/2015   Chart reviewed, patient examined, agree with above. CXR looks good. There is a small basilar space which may persist since the lung did not fully expand there. A small air leak is present. Will decrease suction to 10 cm.

## 2015-10-18 NOTE — Progress Notes (Addendum)
Name: [name] [name] MRN: [id number] DOB: September 26, 1945    ADMISSION DATE:  10/05/2015  CHIEF COMPLAINT:  Dyspnea  BRIEF PATIENT DESCRIPTION: 70 year old female with R hydropneumothorax admitted from office for CVTS evaluation.  STUDIES:  09/16/15 TTE: Normal LV size with mild LV hypertrophy, EF 65-70%. Normal RV size and systolic function. No significant valvular abnormalities. 7/07 CT Chest: moderate R hydropneumothorax, abnormal pleural thickening with scattered areas of pleural fluid in the R hemithorax, additional subpleural reticulation / fibrosis with interlobular septal thickening R>L, fluid along the pancreatic body/tail related to known acute pancreatitis 7/03 Cytology from [facility]: acute inflammation, no malignant cells 7/07 Cytology from [facility]: no malignant cells  7/21 PORT CXR:  Right chest tube in good position. R IJ CVL overlying SVC. Mild blunting of right costophrenic angle. Loculated right basilar pneumothorax.  MICROBIOLOGY:  Urine Ctx 7/2:  Recommended recollection Urine Ctx 7/3:  Recommended recollection R Pleural Fluid 7/3:  Negative R Pleural Fluid 7/6:  Negative MRSA PCR 7/20:  Negative R Pleural Fluid 7/21> neg gm stain>> R Parietal Pleural Biopsy 7/21> neg gm stain>>  ANTIBIOTICS: Cefuroxime 7/20 - 7/21 (surgical prophylaxis)   AUTOIMMUNE: ESR 7/7: 45 GBM 7/7: 3 ANCA 7/7: negative  RIGHT PLEURAL FLUID:  7/3 Glucose:  100 LDH:  403 Total Protein:  <3.0 Amylase:  26 WBC:  341 (40% lymph, 1% eos, 26% neutro, & 23% mono/macro)  7/6 results:Glucose:  72, LDH:  336, Total Protein:  <3.0  SUBJECTIVE:   Cc  chest tube and surgical sites. Denies any dyspnea or significant coughing.       VITAL SIGNS:  On 3lpm NP Temp:  [97.9 F (36.6 C)-98.5 F (36.9 C)] 97.9 F (36.6 C) (07/22 0814) Pulse Rate:  [84-114] 92 (07/22 0814) Resp:  [14-25] 23 (07/22 0814) BP: (118-150)/(64-98) 118/65 mmHg (07/22 0814) SpO2:  [95 %-100 %] 100 % (07/22  0814) Arterial Line BP: (80-171)/(66-109) 127/66 mmHg (07/21 1332)  Physical Exam:  General: Elderly female. No acute distress supine at < 30 degrees  HEENT: Moist mucus membranes. No scleral injection or icterus. Pupils symmetric.  Cardiac: Tachycardic. Regular rhythm & sinus on telemetry. No edema. Pulmonary: Clear bilaterally to auscultation. Normal work of breathing on nasal cannula. Right chest tube in place with respiratory  Variation  Abdomen:  Soft. Nondistended. Nontender. Normal bowel sounds. Neurological:  CN in tact. No meningismus. Grossly nonfocal. Integument:  Warm & dry. No rash on exposed skin.   Recent Labs Lab 10/15/15 0548 10/03/2015 0555 10/18/15 0600  NA 135 134* 137  K 3.9 4.2 3.6  CL 96* 99* 103  CO2 '29 28 28  '$ BUN '8 11 10  '$ CREATININE 0.52 0.49 0.45  GLUCOSE 123* 115* 112*    Recent Labs Lab 10/15/15 0548 10/25/2015 0555 10/18/15 0600  HGB 10.4* 10.9* 9.6*  HCT 32.0* 34.0* 30.4*  WBC 11.3* 14.5* 12.3*  PLT 299 324 284   Dg Chest Port 1 View  10/18/2015  CLINICAL DATA:  Right pleural effusion, chest tube, chest tightness under sternum and generalized diffuse pain. EXAM: PORTABLE CHEST 1 VIEW COMPARISON:  10/23/2015 and CT chest 10/03/2015. FINDINGS: Right IJ central line tip projects over the SVC. Right chest tube remains in place. Tiny amount of pleural air at the base of the right hemi thorax, as before. Mild interstitial prominence in the right hemi thorax with a rounded opacity in the right infrahilar region. Minimal linear subsegmental atelectasis at the base of the left hemi thorax. Probable  tiny left pleural effusion. Biapical pleural thickening. IMPRESSION: 1. Small residual basilar right pneumothorax with right chest tube in place. 2. Rounded opacity in the right infrahilar region may represent loculated pleural fluid. Continued attention on followup exams is warranted. 3. Mild interstitial prominence in the right lung, possibly atelectasis or septal  thickening. 4. Probable tiny left pleural effusion. Electronically Signed   By: Lorin Picket M.D.   On: 10/18/2015 10:35   Dg Chest Port 1 View  10/14/2015  CLINICAL DATA:  Status post VATS. EXAM: PORTABLE CHEST 1 VIEW COMPARISON:  10/15/2015 FINDINGS: Interval VATS procedure. Right sided chest tube in satisfactory position. Small loculated right basilar pneumothorax. No right pleural effusion. Small left pleural effusion. Bilateral mild interstitial thickening. No focal consolidation. Stable cardiomediastinal silhouette. Right jugular central venous catheter with the tip projecting over the SVC. The osseous structures are unremarkable. IMPRESSION: 1. Interval VATS procedure. Right sided chest tube in satisfactory position. Small loculated right basilar pneumothorax. Electronically Signed   By: Kathreen Devoid   On: 10/26/2015 10:23   ASSESSMENT / PLAN:  70 year old female with recurrent right exudative pleural effusion and acute on chronic pancreatitis with pseudocyst formation. Patient underwent VATS 7/21 c/w empyema with culture and parietal pleural biopsy. Successfully extubated post-operative and transferred to the stepdown unit for further care.   1. Recurrent Right Exudative Pleural Effusion c/w empyema    S/P VATS POD #1. Following culture and pathology from VATS. Continuing perioperative prophylactic antibiotics.  2. Post-Operative Surgical Pain:  S/P VATS POD #1 Tylenol PO q6hr & prn. Fentanyl IV prn. Ultram PO prn. 3. Loculated Right Pneumothorax:  Chest tube in place s/p VATS. 4. Right Lower Lobe & Left Upper Lobe Masses:  ANCA negative. 5. Acute Hypoxic Respiratory Failure:  Multi-factorial. Continuing incentive spirometry while awake. OOB to chair as tol/ Weaning FiO2 for Sat >92%. 6. 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 Encouraging Ensure supplementation until appetite improves. 7. Anemia:  No signs of active bleeding & Hgb stable. Plan to transfuse for Hgb  <7.0 or signs of active bleeding. Trending cell counts daily w/ CBC. 8. Leukocytosis:  Likely secondary to stress response. Trending cell counts daily w/ CBC. 9. Asthma vs COPD:  No signs of exacerbation.  Continuing Breo in place of home [place]. Continuing Xopenex nebs q6hr scheduled. 10. Hyponatremia:  Mild. Continuing to monitor electrolytes daily.  11. Hyperglycemia:  No h/o DM. Continuing Accu-Checks q4hr w/ SSI per algorithm. Hgb A1c 5.9 (7/17). 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 13. Hyperlipidemia:  Not currently on home medication. Not initiating statin therapy.  14. Coronary Artery Disease:  Continuing ASA '81mg'$  daily & Toprol XL. 15. Depression:  Takes Valium at home. Monitor for signs of withdrawal. 16. Hypertension:  BP controlled. Continuing Toprol XL '25mg'$  qhs. 17. Ongoing Tobacco Use:  Plan for tobacco cessation education prior to discharge. 18. Prophylaxis:  Pepcid PO daily. SCDs in place 19. Diet:  Clear liquid diet & advance as tolerated. 20. Disposition:  Patient to remain in SDU post-operatively. PT consulted & following.    FAMILY  - Updates: daughter at bedside / no questions or concerns at this point  - Inter-disciplinary family meet or Palliative Care meeting due by:  7/24    Christinia Gully, MD Pulmonary and [place] [phone] After 5:30 PM or weekends, call [phone]

## 2015-10-18 NOTE — Progress Notes (Signed)
eLink Physician-Brief Progress Note Patient Name: [name] [name] DOB: January 13, 1946 MRN: [id number]   Date of Service  10/18/2015  HPI/Events of Note  Patient c/o abdominal pain.   eICU Interventions  Will order a KUB film now.      Intervention Category Intermediate Interventions: Abdominal pain - evaluation and management  Junius Faucett [place] 10/18/2015, 10:48 PM

## 2015-10-19 ENCOUNTER — Inpatient Hospital Stay (HOSPITAL_COMMUNITY): Payer: Medicare Other

## 2015-10-19 ENCOUNTER — Encounter (HOSPITAL_COMMUNITY): Payer: Self-pay | Admitting: Surgery

## 2015-10-19 DIAGNOSIS — R109 Unspecified abdominal pain: Secondary | ICD-10-CM | POA: Diagnosis present

## 2015-10-19 LAB — CBC WITH DIFFERENTIAL/PLATELET
BASOS ABS: 0 10*3/uL (ref 0.0–0.1)
Basophils Relative: 0 %
EOS ABS: 0.3 10*3/uL (ref 0.0–0.7)
EOS PCT: 3 %
HCT: 32.1 % — ABNORMAL LOW (ref 36.0–46.0)
Hemoglobin: 9.9 g/dL — ABNORMAL LOW (ref 12.0–15.0)
LYMPHS ABS: 1.7 10*3/uL (ref 0.7–4.0)
Lymphocytes Relative: 13 %
MCH: 30.9 pg (ref 26.0–34.0)
MCHC: 30.8 g/dL (ref 30.0–36.0)
MCV: 100.3 fL — ABNORMAL HIGH (ref 78.0–100.0)
MONO ABS: 1.6 10*3/uL — AB (ref 0.1–1.0)
Monocytes Relative: 12 %
Neutro Abs: 9.6 10*3/uL — ABNORMAL HIGH (ref 1.7–7.7)
Neutrophils Relative %: 72 %
Platelets: 263 10*3/uL (ref 150–400)
RBC: 3.2 MIL/uL — AB (ref 3.87–5.11)
RDW: 16.9 % — ABNORMAL HIGH (ref 11.5–15.5)
WBC: 13.2 10*3/uL — AB (ref 4.0–10.5)

## 2015-10-19 LAB — RENAL FUNCTION PANEL
Albumin: 2 g/dL — ABNORMAL LOW (ref 3.5–5.0)
Anion gap: 4 — ABNORMAL LOW (ref 5–15)
BUN: 17 mg/dL (ref 6–20)
CALCIUM: 8.4 mg/dL — AB (ref 8.9–10.3)
CO2: 30 mmol/L (ref 22–32)
CREATININE: 0.48 mg/dL (ref 0.44–1.00)
Chloride: 105 mmol/L (ref 101–111)
Glucose, Bld: 96 mg/dL (ref 65–99)
Phosphorus: 3.2 mg/dL (ref 2.5–4.6)
Potassium: 3.5 mmol/L (ref 3.5–5.1)
SODIUM: 139 mmol/L (ref 135–145)

## 2015-10-19 LAB — MAGNESIUM: MAGNESIUM: 2 mg/dL (ref 1.7–2.4)

## 2015-10-19 MED ORDER — DIAZEPAM 5 MG PO TABS
ORAL_TABLET | ORAL | Status: AC
  Administered 2015-10-19: 2.5 mg
  Filled 2015-10-19: qty 1

## 2015-10-19 MED ORDER — DIATRIZOATE MEGLUMINE & SODIUM 66-10 % PO SOLN
ORAL | Status: AC
  Administered 2015-10-19: 19:00:00
  Filled 2015-10-19: qty 30

## 2015-10-19 MED ORDER — GI COCKTAIL ~~LOC~~
ORAL | Status: AC
  Administered 2015-10-19: 02:00:00 via ORAL
  Filled 2015-10-19: qty 30

## 2015-10-19 MED ORDER — FENTANYL CITRATE (PF) 100 MCG/2ML IJ SOLN
50.0000 ug | Freq: Once | INTRAMUSCULAR | Status: AC
  Administered 2015-10-19: 50 ug via INTRAVENOUS

## 2015-10-19 MED ORDER — SIMETHICONE 80 MG PO CHEW
80.0000 mg | CHEWABLE_TABLET | Freq: Four times a day (QID) | ORAL | Status: DC
  Administered 2015-10-19 – 2015-10-28 (×38): 80 mg via ORAL
  Filled 2015-10-19 (×39): qty 1

## 2015-10-19 MED ORDER — IOPAMIDOL (ISOVUE-300) INJECTION 61%
INTRAVENOUS | Status: AC
  Administered 2015-10-19: 100 mL
  Filled 2015-10-19: qty 100

## 2015-10-19 MED ORDER — CLONIDINE HCL 0.1 MG PO TABS
0.1000 mg | ORAL_TABLET | Freq: Three times a day (TID) | ORAL | Status: DC
  Administered 2015-10-19 – 2015-10-25 (×19): 0.1 mg via ORAL
  Filled 2015-10-19 (×20): qty 1

## 2015-10-19 NOTE — Consult Note (Signed)
Consultation  Referring Provider:   Dr. Cyndia Bent    Primary Care Physician:  [name], MD Primary Gastroenterologist:  Dr. Carlean Purl       Reason for Consultation:  LUQ pain, Pancreatitis          HPI:   Trenace Sessoms is a 70 y.o. female with a past medical history significant for COPD, asthma, tobacco abuse, hyperlipidemia, GERD, depression, anxiety, chronic diarrhea, CAD, granulomatosis with polyangiitis, and a recent admission due to acute pancreatitis with pseudocyst. Patient was recently admitted on 10/16/15 after being seen at the outpatient pulmonary/critical care office with a complaint of worsening dyspnea. We were consulted today regarding patient's ongoing left upper quadrant pain.  Today, the patient tells me that she has had a left-sided abdominal pain even before the time of her admission for pancreatitis, she cannot remember exactly how long this has been going on but over the past 3-4 days this has been increasing in frequency and getting worse. She tells me this pain is sharp and intermittently fluctuates in intensity and seems to be made worse by changing position. This pain sometimes radiates across into her epigastrium and feels as if someone is clenching her stomach. She is unable to tell me if it is increased after eating, as she has had "no appetite". The patient explains that if she is not on pain medications even light touch to the left side of her body from the lower abdomen all the way into her left shoulder causes her pain.   Recently, the patient has had a change from her chronic diarrhea to constipation, she is currently receiving laxatives which do work.  Patient denies heartburn, reflux, bloating or similar episodes of the same pain.  Hospital course: 10/16/2015-patient underwent right video-assisted thoracoscopy with drainage of right empyema  Previous GI history: 09/18/15-telephone call, Dr. Carlean Purl: Patient complained of increased gas and starting  prednisone, was told to try Phazyme OTC and a low gas diet 09/24/14-office visit, Dr. Carlean Purl: Patient was seen for diarrhea, she has history of irritable bowel syndrome plus minus postcholecystectomy diarrhea;Patient was doing well with when necessary colestipol and PPI 07/16/14-colonoscopy, Dr. Carlean Purl: Impression: Terminal ileum normal, sessile polyps ranging from 1-2 mm in the cecum, 3 mm cecal AVMs nonbleeding and otherwise normal colonic mucosa 07/16/14-EGD, Dr. Carlean Purl: Impression: 3-4 cm hiatal hernia and otherwise normal EGD Past Medical History:  Diagnosis Date  . Anxiety   . Arthritis    "all over me, I guess" (09/27/2015)  . ASTHMA   . 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 CAD (coronary artery disease)   . 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 Chronic kidney disease   . COPD (chronic obstructive pulmonary disease) with emphysema ([place])    mild dz on PFTs 11/2013  . Depression   . GERD   . 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 H/O hiatal hernia   . Hereditary hemochromatosis ([place]) 2004  . Hx of adenomatous polyp of colon 07/25/2014  . Hyperlipidemia   . HYPERTENSION   . Hypertension   . Migraine    "hx; not anymore" (10/14/2015)  . On home oxygen therapy    "2L; 24/7" (09/27/2015)  . Osteopenia 10/07/2014   DEXA @ [facility] 09/2014: -1.9 L fem  . PONV (postoperative nausea and vomiting)   . Sinus headache   . Urinary tract infection    "recurrent sometimes" (10/12/2015)    Past Surgical History:  Procedure Laterality Date  .  ABDOMINAL HYSTERECTOMY  ~ 1983  . APPENDECTOMY  ~ 1983  . BLADDER SUSPENSION  03/29/2002  . COLONOSCOPY W/ BIOPSIES    . CYSTECTOMY     cyst removed from intestines to ovary    . CYSTOSCOPY  01/24/14   NEGATIVE;Dr Ottelin  . DILATION AND CURETTAGE OF UTERUS    . ESOPHAGOGASTRODUODENOSCOPY    . LAPAROSCOPIC CHOLECYSTECTOMY  ~ 2011  . TENDON RELEASE Left    wrist     Family History  Problem  Relation Age of Onset  . Hypertension Mother   . Heart disease Mother   . Breast cancer Mother 85  . Diabetes Father   . Heart disease Father   . Alcohol abuse Other   . Arthritis Other   . Cirrhosis Brother   . Cirrhosis Brother   . Cirrhosis Brother   . Lung cancer Brother   . Colon cancer Neg Hx   . Colon polyps Neg Hx   . Esophageal cancer Neg Hx   . Rectal cancer Neg Hx   . Stomach cancer Neg Hx    Social History  Substance Use Topics  . Smoking status: Former Smoker    Packs/day: 0.50    Years: 54.00    Types: Cigarettes    Quit date: 08/28/2015  . Smokeless tobacco: Never Used  . Alcohol use No    Prior to Admission medications   Medication Sig Start Date End Date Taking? Authorizing Provider  acetaminophen (TYLENOL) 500 MG tablet Take 500 mg by mouth every 6 (six) hours as needed (pain).   Yes Historical Provider, MD  albuterol (PROVENTIL) (2.5 MG/3ML) 0.083% nebulizer solution Take 3 mLs (2.5 mg total) by nebulization every 6 (six) hours as needed for wheezing or shortness of breath. Patient taking differently: Take 2.5 mg by nebulization 2 (two) times daily as needed for wheezing or shortness of breath.  10/09/15  Yes [name], MD  aspirin EC 81 MG tablet Take 81 mg by mouth daily.   Yes Historical Provider, MD  diazepam (VALIUM) 5 MG tablet Take 0.5-1 tablets (2.5-5 mg total) by mouth every 12 (twelve) hours as needed for anxiety. Patient taking differently: Take 5 mg by mouth every 12 (twelve) hours as needed for anxiety (sleep).  10/12/2015  Yes [name], MD  fexofenadine (ALLEGRA) 180 MG tablet Take 180 mg by mouth at bedtime.    Yes Historical Provider, MD  Melatonin 5 MG TABS Take 5 mg by mouth at bedtime.   Yes Historical Provider, MD  metoprolol succinate (TOPROL XL) 25 MG 24 hr tablet Take 1 tablet (25 mg total) by mouth at bedtime. 09/19/15  Yes Burtis Junes, NP  metoprolol succinate (TOPROL-XL) 50 MG 24 hr tablet Take 1 tablet (50 mg total) by mouth  daily. Take with or immediately following a meal. 08/28/15  Yes Rhonda G Barrett, PA-C  mometasone-formoterol (DULERA) 200-5 MCG/ACT AERO Inhale 2 puffs into the lungs 2 (two) times daily. 10/12/2015  Yes [name], MD  omeprazole (PRILOSEC) 20 MG capsule Take 1 capsule (20 mg total) by mouth 2 (two) times daily before a meal. 10/12/2015  Yes [name], MD  ondansetron (ZOFRAN ODT) 8 MG disintegrating tablet Take 1 tablet (8 mg total) by mouth every 8 (eight) hours as needed for nausea or vomiting. 09/27/15  Yes Barton Dubois, MD  OXYGEN Inhale 2 L into the lungs continuous.   Yes Historical Provider, MD  simethicone (MYLICON) 0000000 MG chewable tablet Chew 125  mg by mouth 4 (four) times daily.    Yes Historical Provider, MD  traMADol (ULTRAM) 50 MG tablet Take 1-2 tablets (50-100 mg total) by mouth every 8 (eight) hours as needed for severe pain. 10/07/2015  Yes [name], MD  potassium chloride (K-DUR) 10 MEQ tablet Take 1 tablet (10 mEq total) by mouth daily. Patient not taking: Reported on 10/04/2015 10/07/15   Tawni Millers, MD    Current Facility-Administered Medications  Medication Dose Route Frequency Provider Last Rate Last Dose  . acetaminophen (TYLENOL) tablet 1,000 mg  1,000 mg Oral Q6H Gaye Pollack, MD       Or  . acetaminophen (TYLENOL) solution 1,000 mg  1,000 mg Oral Q6H Gaye Pollack, MD   1,000 mg at 10/18/15 1700  . acetaminophen (TYLENOL) tablet 650 mg  650 mg Oral Q6H PRN Magdalen Spatz, NP   650 mg at 10/16/15 1911   Or  . acetaminophen (TYLENOL) suppository 650 mg  650 mg Rectal Q6H PRN Magdalen Spatz, NP      . alum & mag hydroxide-simeth (MAALOX/MYLANTA) 200-200-20 MG/5ML suspension 30 mL  30 mL Oral Q4H PRN Gaye Pollack, MD   30 mL at 10/18/15 2133  . aspirin EC tablet 81 mg  81 mg Oral Daily Magdalen Spatz, NP   81 mg at 10/19/15 0831  . bisacodyl (DULCOLAX) EC tablet 10 mg  10 mg Oral Daily Gaye Pollack, MD   10 mg at 10/19/15 [id number]  . cloNIDine (CATAPRES)  tablet 0.1 mg  0.1 mg Oral TID Tanda Rockers, MD   0.1 mg at 10/19/15 0853  . dextrose 5 %-0.9 % sodium chloride infusion   Intravenous Continuous Erin R Barrett, PA-C 10 mL/hr at 10/18/15 0950    . famotidine (PEPCID) tablet 20 mg  20 mg Oral Daily Magdalen Spatz, NP   20 mg at 10/19/15 [id number]  . feeding supplement (ENSURE ENLIVE) (ENSURE ENLIVE) liquid 237 mL  237 mL Oral BID BM Jose Shirl Harris, MD   237 mL at 10/18/15 [phone]  . feeding supplement (PRO-STAT SUGAR FREE 64) liquid 30 mL  30 mL Oral TID WC Jose Shirl Harris, MD   30 mL at 10/19/15 0831  . fentaNYL (SUBLIMAZE) injection 25-50 mcg  25-50 mcg Intravenous Q2H PRN Gaye Pollack, MD   50 mcg at 10/19/15 0826  . fluticasone furoate-vilanterol (BREO ELLIPTA) 100-25 MCG/INH 1 puff  1 puff Inhalation Daily Magdalen Spatz, NP   1 puff at 10/16/15 0757  . levalbuterol (XOPENEX) nebulizer solution 0.63 mg  0.63 mg Nebulization TID Gaye Pollack, MD   0.63 mg at 10/18/15 2044  . levalbuterol (XOPENEX) nebulizer solution 0.63 mg  0.63 mg Nebulization Q6H PRN Gaye Pollack, MD      . multivitamin with minerals tablet 1 tablet  1 tablet Oral Daily Jose Shirl Harris, MD   1 tablet at 10/19/15 [phone]  . ondansetron (ZOFRAN) tablet 4 mg  4 mg Oral Q6H PRN Magdalen Spatz, NP       Or  . ondansetron [facility]) injection 4 mg  4 mg Intravenous Q6H PRN Magdalen Spatz, NP   4 mg at 10/18/15 2314  . polyethylene glycol (MIRALAX / GLYCOLAX) packet 17 g  17 g Oral Daily PRN Magdalen Spatz, NP      . potassium chloride 10 mEq in 50 mL *CENTRAL LINE* IVPB  10 mEq Intravenous  Daily PRN Gaye Pollack, MD      . senna-docusate (Senokot-S) tablet 1 tablet  1 tablet Oral QHS Gaye Pollack, MD   1 tablet at 10/18/15 2133  . simethicone (MYLICON) chewable tablet 80 mg  80 mg Oral QID Tanda Rockers, MD   80 mg at 10/19/15 0853  . sodium chloride flush (NS) 0.9 % injection 3 mL  3 mL Intravenous Q12H Magdalen Spatz, NP   3 mL at 10/19/15 0858  . sodium chloride  flush (NS) 0.9 % injection 3 mL  3 mL Intravenous Q12H Magdalen Spatz, NP   3 mL at 10/19/15 0858  . traMADol (ULTRAM) tablet 50 mg  50 mg Oral Q6H PRN Anders Simmonds, MD   50 mg at 10/18/15 2133    Allergies as of 10/16/2015 - Review Complete 10/25/2015  Allergen Reaction Noted  . Other Other (See Comments) 07/17/2015  . Ciprofloxacin Hives and Other (See Comments) 05/15/2010  . Codeine Nausea And Vomiting and Other (See Comments) 03/10/2011  . Macrobid [nitrofurantoin monohyd macro] Other (See Comments) 05/07/2014  . Morphine and related Other (See Comments) 03/10/2011  . Prevnar [pneumococcal 13-val conj vacc] Palpitations and Other (See Comments) 06/07/2014     Review of Systems:    Constitutional: Positive to fever and weakness before admission No weight loss or fatigue HEENT: Eyes: No change in vision               Ears, Nose, Throat:  No change in hearing Skin: No rash or itching Cardiovascular: No chest pain or palpitations    Respiratory: Positive for shortness of breath and cough  Gastrointestinal: See HPI and otherwise negative Genitourinary: No dysuria or change in urinary frequency Neurological: No headache or dizziness Musculoskeletal: Positive for back pain No muscle or joint pain Hematologic: No bleeding Psychiatric: No history of depression or anxiety   Physical Exam:  Vital signs in last 24 hours: Temp:  [97.5 F (36.4 C)-99.2 F (37.3 C)] 99.2 F (37.3 C) (07/23 0815) Pulse Rate:  [94-106] 106 (07/23 0815) Resp:  [20-26] 25 (07/23 0815) BP: (123-211)/(54-73) 138/63 (07/23 0815) SpO2:  [91 %-100 %] 98 % (07/23 0815) Last BM Date: 10/14/15 General:   Pleasant Elderly Caucasian female appears to be in some discomfort, although she has just received pain medications, Well developed, Well nourished, alert and cooperative Head:  Normocephalic and atraumatic. Eyes:   PEERL, EOMI. No icterus. Conjunctiva pink. Ears:  Normal auditory acuity. Neck:   Supple Throat: Oral cavity and pharynx without inflammation, swelling or lesion.  Lungs: Respirations even and unlabored. Lungs clear to auscultation bilaterally.   No wheezes, crackles, or rhonchi. Right chest tube in place with respiratory variation, nasal cannula in place Heart: Normal S1, S2. No MRG. Tachycardic No peripheral edema, cyanosis or pallor.  Abdomen:  Soft, generalized tenderness to palpation, worse on the left side, involuntary guarding to even light palpation in left upper quadrant and up across left side of rib cage Rectal:  Not performed.  Msk:  Symmetrical without gross deformities. Peripheral pulses intact.  Extremities:  Without edema, no deformity or joint abnormality.  Neurologic:  Alert and  oriented x4;  grossly normal neurologically. CN II-XII intact.  Skin:   Dry and intact without significant lesions or rashes. Psychiatric: Oriented to person, place and time. Demonstrates good judgement and reason without abnormal affect or behaviors.   LAB RESULTS:  Recent Labs  10/09/2015 0555 10/18/15 0600 10/19/15 0515  WBC 14.5* 12.3*  13.2*  HGB 10.9* 9.6* 9.9*  HCT 34.0* 30.4* 32.1*  PLT 324 284 263   BMET  Recent Labs  10/11/2015 0555 10/18/15 0600 10/19/15 0515  NA 134* 137 139  K 4.2 3.6 3.5  CL 99* 103 105  CO2 28 28 30   GLUCOSE 115* 112* 96  BUN 11 10 17   CREATININE 0.49 0.45 0.48  CALCIUM 9.0 8.4* 8.4*   LFT  Recent Labs  10/19/15 0515  ALBUMIN 2.0*   PT/INR  Recent Labs  10/26/2015 0555  LABPROT 15.2  INR 1.18    STUDIES: Dg Chest Port 1 View  Result Date: 10/18/2015 CLINICAL DATA:  Right pleural effusion, chest tube, chest tightness under sternum and generalized diffuse pain. EXAM: PORTABLE CHEST 1 VIEW COMPARISON:  10/12/2015 and CT chest 10/03/2015. FINDINGS: Right IJ central line tip projects over the SVC. Right chest tube remains in place. Tiny amount of pleural air at the base of the right hemi thorax, as before. Mild interstitial  prominence in the right hemi thorax with a rounded opacity in the right infrahilar region. Minimal linear subsegmental atelectasis at the base of the left hemi thorax. Probable tiny left pleural effusion. Biapical pleural thickening. IMPRESSION: 1. Small residual basilar right pneumothorax with right chest tube in place. 2. Rounded opacity in the right infrahilar region may represent loculated pleural fluid. Continued attention on followup exams is warranted. 3. Mild interstitial prominence in the right lung, possibly atelectasis or septal thickening. 4. Probable tiny left pleural effusion. Electronically Signed   By: Lorin Picket M.D.   On: 10/18/2015 10:35   Dg Chest Port 1 View  Result Date: 10/01/2015 CLINICAL DATA:  Status post VATS. EXAM: PORTABLE CHEST 1 VIEW COMPARISON:  10/15/2015 FINDINGS: Interval VATS procedure. Right sided chest tube in satisfactory position. Small loculated right basilar pneumothorax. No right pleural effusion. Small left pleural effusion. Bilateral mild interstitial thickening. No focal consolidation. Stable cardiomediastinal silhouette. Right jugular central venous catheter with the tip projecting over the SVC. The osseous structures are unremarkable. IMPRESSION: 1. Interval VATS procedure. Right sided chest tube in satisfactory position. Small loculated right basilar pneumothorax. Electronically Signed   By: Kathreen Devoid   On: 10/24/2015 10:23   Dg Abd Portable 1v  Result Date: 10/18/2015 CLINICAL DATA:  Left lower quadrant abdominal discomfort EXAM: PORTABLE ABDOMEN - 1 VIEW COMPARISON:  CT abdomen pelvis dated 09/29/2015 FINDINGS: Nonobstructive bowel gas pattern. Cholecystectomy clips. Mild degenerative changes the lumbar spine. IMPRESSION: Unremarkable abdominal radiograph. Electronically Signed   By: Julian Hy M.D.   On: 10/18/2015 23:48    CT ABDOMEN AND PELVIS WITH CONTRAST 09/29/15  TECHNIQUE: Multidetector CT imaging of the abdomen and pelvis was  performed using the standard protocol following bolus administration of intravenous contrast.  CONTRAST:  19mL ISOVUE-300 IOPAMIDOL (ISOVUE-300) INJECTION 61%  COMPARISON:  09/23/2015  FINDINGS: Lower chest: Bilateral pleural effusions are noted right considerably greater than left increased from the prior exam. Right lower lobe consolidation is noted as well. The previously seen right cavitary lesion is obscured by the consolidation. Mild left basilar atelectasis is noted as well.  Hepatobiliary: Scattered hypodensities are noted throughout the liver consistent with hepatic cysts. The gallbladder has been surgically removed.  Pancreas: Pancreas is well visualized and demonstrates considerable peripancreatic fluid. A dominant cyst is noted adjacent to the head of the pancreas measuring 3.6 cm. The previous measurement included in adjacent smaller cystic lesion accounting for the larger measurement on the prior exam. Increasing pseudocysts are noted about  the tail.  Spleen: Within normal limits in size and appearance.  Adrenals/Urinary Tract: No masses identified. No evidence of hydronephrosis.  Stomach/Bowel: Diverticular change is noted of the colon without evidence of diverticulitis. The appendix is not visualized consistent with prior surgical history.  Vascular/Lymphatic: No pathologically enlarged lymph nodes. Aortoiliac calcifications are noted with mild ectasia of the distal abdominal aorta measuring 2.7 cm.  Reproductive: The uterus has been surgically removed. No pelvic mass lesion is noted.  Other: None.  Musculoskeletal:  Degenerative changes of lumbar spine are seen.  IMPRESSION: Increasing bilateral pleural effusions. Increased right lower lobe consolidation is noted obscuring the known cavitary mass lesion.  Increase in the size and number of peripancreatic pseudocysts.   Electronically Signed   By: Inez Catalina M.D.   On:  09/29/2015 12:38  PREVIOUS ENDOSCOPIES:            See history of present illness   Impression / Plan:   Impression: 1. Left-sided abdominal pain: Patient reports left-sided abdominal pain which has been there for "months", this seems to have worsened recently during the past few days, patient notes a tenderness radiating up her left side all the way into her shoulder, she is very tender to even light palpation in the left upper quadrant, decreased appetite; consider musculoskeletal versus abdominal wall pain versus GI cause 2. Pancreatic pseudocysts?: Seen at time last CT scan admission on 09/29/2015, no recent imaging and lipase has not been drawn within the past 4 days, this may or may not be continuing to patient's current pain 3. Acute on chronic respiratory failure with hypoxia: Pulmonology team managing 4. GERD:Controlled on PPI 5. Hereditary hemochromatosis 6. Chronic Diarrhea: But due to postcholecystectomy status and IBS, per Dr. Carlean Purl, though while in hospital patient has had to have laxatives are to have a bowel movement  Plan:  1. Will discuss above with Dr. Ardis Hughs, please await recommendations  Thank you for your kind consultation, we will continue to follow.  Lavone Nian Lemmon  10/19/2015, 9:15 AM Pager #: [phone]   ________________________________________________________________________  Velora Heckler GI MD note:  I personally examined the patient, reviewed the data and agree with the assessment and plan described above.  First, I don't have any reason to think that she has chronic pancreatitis. She's never had issues with her pancreas before the past month, never etoh abuser and no sign of chronic pancreatitis on imaging. She does have fluid collections around her pancreas and seemed to have acute pancreatitis about a month ago. These would not be called pseudocysts, rather acute peripancreatic fluid collections IF she they are indeed inflammatory, related to acute  pancreatitis which they may not be.  Possibly she has cystic neoplasm.  There is no sign of solid pancreatic mass fortunately.  She is in a lot of abd pain currently and is tender.  I think first step is repeat imaging of her pancreas, abd.  I will order CT scan abd/pelvis for today.  This does not seem related to gas.  Does not seem musculoskeletal.     Owens Loffler, MD [facility] Pager [phone]

## 2015-10-19 NOTE — Progress Notes (Addendum)
      [address]       [place] [address]             [phone]      2 Days Post-Op Procedure(s) (LRB): VIDEO ASSISTED THORACOSCOPY (VATS) with Drainage of Pleural Effusion (Right) DRAINAGE OF PLEURAL EFFUSION (Right)   Subjective:  [name] [name] complains of abdominal pain this morning.  She denies nausea/vomiting.  She has moved her bowels.  Objective: Vital signs in last 24 hours: Temp:  [97.5 F (36.4 C)-99.2 F (37.3 C)] 99.2 F (37.3 C) (07/23 0815) Pulse Rate:  [94-106] 106 (07/23 0815) Cardiac Rhythm: Sinus tachycardia (07/23 0700) Resp:  [20-26] 25 (07/23 0815) BP: (123-211)/(54-73) 138/63 (07/23 0815) SpO2:  [91 %-100 %] 98 % (07/23 0815)    Intake/Output from previous day: 07/22 0701 - 07/23 0700 In: -  Out: 908 [Urine:726; [place]; Chest Tube:180] Intake/Output this shift: Total I/O In: -  Out: 30 [Chest Tube:30]  General appearance: alert, cooperative and mild distress Heart: regular rate and rhythm Lungs: clear to auscultation bilaterally Abdomen: soft- non-distended, tender/mild guarding to palpation LUQ Wound: clean and dry  Lab Results:  Recent Labs  10/18/15 0600 10/19/15 0515  WBC 12.3* 13.2*  HGB 9.6* 9.9*  HCT 30.4* 32.1*  PLT 284 263   BMET:  Recent Labs  10/18/15 0600 10/19/15 0515  NA 137 139  K 3.6 3.5  CL 103 105  CO2 28 30  GLUCOSE 112* 96  BUN 10 17  CREATININE 0.45 0.48  CALCIUM 8.4* 8.4*    PT/INR:  Recent Labs  10/10/2015 0555  LABPROT 15.2  INR 1.18   ABG    Component Value Date/Time   PHART 7.473 (H) 10/05/2015 2223   HCO3 26.7 (H) 10/05/2015 2223   TCO2 24.2 10/05/2015 2223   O2SAT 92.2 10/05/2015 2223   CBG (last 3)   Recent Labs  10/18/15 0407 10/18/15 0814 10/18/15 1133  GLUCAP 99 160* 93    Assessment/Plan: S/P Procedure(s) (LRB): VIDEO ASSISTED THORACOSCOPY (VATS) with Drainage of Pleural Effusion (Right) DRAINAGE OF PLEURAL EFFUSION (Right)  1. Chest tube- 180 cc  output, intermittent 1+ air leak, CXR stable in appearance... Leave on 10cm suction 2. GI- H/O Peripancreatic Pseudocyst- GI has evaluated patient...ABD film obtained last night and was benign 3. CV- Tachy-likely due to GI discomfort... BP controlled, continue home medications 4. Dispo- patient with abdominal pain... GI eval pending, continue chest tube   LOS: 6 days    [name], [name] 10/19/2015  Chart reviewed, patient examined, agree with above. Still has a small air leak. Continue 10 cm suction.

## 2015-10-19 NOTE — Progress Notes (Signed)
Patient continues to c/o left abd pain that feels like gas little relief from PRN meds. Xray of abd done shoed nothing significant per report. Patient had 2 BM's loose and brown.

## 2015-10-19 NOTE — Progress Notes (Signed)
Name: Ainara Eldridge MRN: [id number] DOB: May 06, 1945    ADMISSION DATE:  10/07/2015  CHIEF COMPLAINT:  Dyspnea  BRIEF PATIENT DESCRIPTION: 70 year old female with R hydropneumothorax admitted from office for CVTS evaluation.  STUDIES:  09/16/15 TTE: Normal LV size with mild LV hypertrophy, EF 65-70%. Normal RV size and systolic function. No significant valvular abnormalities. 7/07 CT Chest: moderate R hydropneumothorax, abnormal pleural thickening with scattered areas of pleural fluid in the R hemithorax, additional subpleural reticulation / fibrosis with interlobular septal thickening R>L, fluid along the pancreatic body/tail related to known acute pancreatitis 7/03 Cytology from [facility]: acute inflammation, no malignant cells 7/07 Cytology from [facility]: no malignant cells     MICROBIOLOGY:  Urine Ctx 7/2:  Recommended recollection Urine Ctx 7/3:  Recommended recollection R Pleural Fluid 7/3:  Negative R Pleural Fluid 7/6:  Negative MRSA PCR 7/20:  Negative R Pleural Fluid 7/21> neg gm stain>> R Parietal Pleural Biopsy 7/21> neg gm stain>>  ANTIBIOTICS: Cefuroxime 7/20 - 7/21 (surgical prophylaxis)   AUTOIMMUNE: ESR 7/7: 45 GBM 7/7: 3 ANCA 7/7: negative  RIGHT PLEURAL FLUID:  7/3 Glucose:  100 LDH:  403 Total Protein:  <3.0 Amylase:  26 WBC:  341 (40% lymph, 1% eos, 26% neutro, & 23% mono/macro)  7/6 results:Glucose:  72, LDH:  336, Total Protein:  <3.0  SUBJECTIVE:   Cc  chest tube and surgical sites.plus recurrent LUQ pain rad to L shoulder relieved by flatus      VITAL SIGNS:  On 3lpm NP Temp:  [97.5 F (36.4 C)-98.7 F (37.1 C)] 98.5 F (36.9 C) (07/23 0400) Pulse Rate:  [94-103] 103 (07/23 0400) Resp:  [20-26] 24 (07/23 0400) BP: (123-211)/(54-73) 129/73 (07/23 0400) SpO2:  [91 %-100 %] 91 % (07/23 0400)  Physical Exam:  General: Elderly female at 30 degrees HOB/  Uncomfortable holding L flank  HEENT: Moist mucus membranes. No scleral  injection or icterus. Pupils symmetric.  Cardiac: Tachycardic. Regular rhythm & sinus on telemetry. No edema. Pulmonary: Clear bilaterally to auscultation. Normal work of breathing on nasal cannula. Right chest tube in place with respiratory  Variation  Abdomen:  Mod distended. Diffusely tender/ no rebound. decreased bowel sounds. Neurological:  CN in tact. No meningismus. Grossly nonfocal. Integument:  Warm & dry. No rash on exposed skin.   Recent Labs Lab 10/12/2015 0555 10/18/15 0600 10/19/15 0515  NA 134* 137 139  K 4.2 3.6 3.5  CL 99* 103 105  CO2 _0 BUN _1 CREATININE 0.49 0.45 0.48  GLUCOSE 115* 112* 96    Recent Labs Lab 10/26/2015 0555 10/18/15 0600 10/19/15 0515  HGB 10.9* 9.6* 9.9*  HCT 34.0* 30.4* 32.1*  WBC 14.5* 12.3* 13.2*  PLT 324 284 263   Dg Chest Port 1 View  Result Date: 10/18/2015 CLINICAL DATA:  Right pleural effusion, chest tube, chest tightness under sternum and generalized diffuse pain. EXAM: PORTABLE CHEST 1 VIEW COMPARISON:  10/10/2015 and CT chest 10/03/2015. FINDINGS: Right IJ central line tip projects over the SVC. Right chest tube remains in place. Tiny amount of pleural air at the base of the right hemi thorax, as before. Mild interstitial prominence in the right hemi thorax with a rounded opacity in the right infrahilar region. Minimal linear subsegmental atelectasis at the base of the left hemi thorax. Probable tiny left pleural effusion. Biapical pleural thickening. IMPRESSION: 1. Small residual basilar right pneumothorax with right chest tube in place. 2. Rounded opacity in  the right infrahilar region may represent loculated pleural fluid. Continued attention on followup exams is warranted. 3. Mild interstitial prominence in the right lung, possibly atelectasis or septal thickening. 4. Probable tiny left pleural effusion. Electronically Signed   By: Lorin Picket M.D.   On: 10/18/2015 10:35   Dg Chest Port 1 View  Result Date:  10/25/2015 CLINICAL DATA:  Status post VATS. EXAM: PORTABLE CHEST 1 VIEW COMPARISON:  10/15/2015 FINDINGS: Interval VATS procedure. Right sided chest tube in satisfactory position. Small loculated right basilar pneumothorax. No right pleural effusion. Small left pleural effusion. Bilateral mild interstitial thickening. No focal consolidation. Stable cardiomediastinal silhouette. Right jugular central venous catheter with the tip projecting over the SVC. The osseous structures are unremarkable. IMPRESSION: 1. Interval VATS procedure. Right sided chest tube in satisfactory position. Small loculated right basilar pneumothorax. Electronically Signed   By: Kathreen Devoid   On: 10/10/2015 10:23   Dg Abd Portable 1v  Result Date: 10/18/2015 CLINICAL DATA:  Left lower quadrant abdominal discomfort EXAM: PORTABLE ABDOMEN - 1 VIEW COMPARISON:  CT abdomen pelvis dated 09/29/2015 FINDINGS: Nonobstructive bowel gas pattern. Cholecystectomy clips. Mild degenerative changes the lumbar spine. IMPRESSION: Unremarkable abdominal radiograph. Electronically Signed   By: Julian Hy M.D.   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 Patient underwent VATS 7/21 c/w empyema with culture and parietal pleural biopsy. Successfully extubated post-operative and transferred to the stepdown unit for further care.   1. Recurrent Right Exudative Pleural Effusion c/w empyema    S/P VATS 7/21 . Following culture and pathology from VATS. Continuing perioperative prophylactic antibiotics.  2. Post-Operative Surgical Pain:  S/P VATS 7/21  Tylenol PO q6hr & prn. Fentanyl IV prn. Ultram PO prn. 3. Loculated Right Pneumothorax:  Chest tube in place s/p VATS. 4. Right Lower Lobe & Left Upper Lobe Masses:  ANCA negative. 5. Acute Hypoxic Respiratory Failure:  Multi-factorial. Continuing incentive spirometry while  awake. OOB to chair as tol/ Weaning FiO2 for Sat >92%. 6. Acute on Chronic Pancreatitis w/ Pseudocyst with worsening LUQ pain rad to shoulder started 6 weeks pta. Zofran prn nausea.  Will try phazyme as per Dr Celesta Aver last recs and consult GI - difficult to know how much narcotic to use as has used them pre op and may have added to tendency to constipation/ gas pain 7. Anemia:  No signs of active bleeding & Hgb stable. Plan to transfuse for Hgb <7.0 or signs of active bleeding. Trending cell counts daily w/ CBC. 8. Leukocytosis:  Likely secondary to stress response. Trending cell counts daily w/ CBC. 9. Asthma vs COPD:  No signs of exacerbation.  Continuing Breo in place of home Falman. Continuing Xopenex nebs q6hr scheduled. 10. Hyponatremia:  Mild. Continuing to monitor electrolytes daily.  11. Hyperglycemia:  No h/o DM. Continuing Accu-Checks q4hr w/ SSI per algorithm. Hgb A1c 5.9 (7/17). 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 13. Hyperlipidemia:  Not currently on home medication. Not initiating statin therapy.  14. Coronary Artery Disease:  Continuing ASA 34m daily & Toprol XL. 15. Depression:  Takes Valium at home. Monitor for signs of withdrawal. 16. Hypertension:  Changed to clonidine 7/23 to help reduce narc dependency post op  17. Ongoing Tobacco Use PTA  Plan for tobacco cessation education prior to discharge. 18. Prophylaxis:  Pepcid PO daily. SCDs in place 19. Diet:  Clear liquid diet &  advance as tolerated. 20. Disposition:  Patient to remain in SDU post-operatively. PT consulted & following.    FAMILY  - Updates: daughter at bedside/ advised will have GI see 7/23   - Inter-disciplinary family meet or Palliative Care meeting due by:  7/24    Christinia Gully, MD Pulmonary and [place] [phone] After 5:30 PM or weekends, call [phone]

## 2015-10-20 ENCOUNTER — Inpatient Hospital Stay (HOSPITAL_COMMUNITY): Payer: Medicare Other

## 2015-10-20 DIAGNOSIS — E46 Unspecified protein-calorie malnutrition: Secondary | ICD-10-CM

## 2015-10-20 DIAGNOSIS — K8689 Other specified diseases of pancreas: Secondary | ICD-10-CM

## 2015-10-20 DIAGNOSIS — R1012 Left upper quadrant pain: Secondary | ICD-10-CM

## 2015-10-20 DIAGNOSIS — K858 Other acute pancreatitis without necrosis or infection: Secondary | ICD-10-CM

## 2015-10-20 LAB — RENAL FUNCTION PANEL
ANION GAP: 5 (ref 5–15)
Albumin: 1.9 g/dL — ABNORMAL LOW (ref 3.5–5.0)
BUN: 12 mg/dL (ref 6–20)
CHLORIDE: 104 mmol/L (ref 101–111)
CO2: 29 mmol/L (ref 22–32)
Calcium: 8.1 mg/dL — ABNORMAL LOW (ref 8.9–10.3)
Creatinine, Ser: 0.44 mg/dL (ref 0.44–1.00)
GFR calc non Af Amer: >60 mL/min (ref >60)
Glucose, Bld: 88 mg/dL (ref 65–99)
Phosphorus: 3.4 mg/dL (ref 2.5–4.6)
Potassium: 3.4 mmol/L — ABNORMAL LOW (ref 3.5–5.1)
Sodium: 138 mmol/L (ref 135–145)

## 2015-10-20 LAB — CBC WITH DIFFERENTIAL/PLATELET
Basophils Absolute: 0 10*3/uL (ref 0.0–0.1)
Basophils Relative: 0 %
EOS PCT: 4 %
Eosinophils Absolute: 0.4 10*3/uL (ref 0.0–0.7)
HCT: 27.2 % — ABNORMAL LOW (ref 36.0–46.0)
HEMOGLOBIN: 8.5 g/dL — AB (ref 12.0–15.0)
LYMPHS ABS: 1.5 10*3/uL (ref 0.7–4.0)
LYMPHS PCT: 14 %
MCH: 31 pg (ref 26.0–34.0)
MCHC: 31.3 g/dL (ref 30.0–36.0)
MCV: 99.3 fL (ref 78.0–100.0)
MONOS PCT: 13 %
Monocytes Absolute: 1.4 10*3/uL — ABNORMAL HIGH (ref 0.1–1.0)
Neutro Abs: 7.4 10*3/uL (ref 1.7–7.7)
Neutrophils Relative %: 69 %
PLATELETS: 222 10*3/uL (ref 150–400)
RBC: 2.74 MIL/uL — AB (ref 3.87–5.11)
RDW: 16.6 % — ABNORMAL HIGH (ref 11.5–15.5)
WBC: 10.6 10*3/uL — AB (ref 4.0–10.5)

## 2015-10-20 LAB — MAGNESIUM: Magnesium: 2 mg/dL (ref 1.7–2.4)

## 2015-10-20 MED ORDER — DIAZEPAM 5 MG PO TABS
2.5000 mg | ORAL_TABLET | Freq: Two times a day (BID) | ORAL | Status: DC | PRN
  Administered 2015-10-20 – 2015-10-21 (×2): 5 mg via ORAL
  Filled 2015-10-20 (×2): qty 1

## 2015-10-20 NOTE — Progress Notes (Addendum)
      [address]       [place] [address]             [phone]      3 Days Post-Op Procedure(s) (LRB): VIDEO ASSISTED THORACOSCOPY (VATS) with Drainage of Pleural Effusion (Right) DRAINAGE OF PLEURAL EFFUSION (Right)   Subjective:  [name] [name] continues to have abdominal discomfort.  She states its not as bad as yesterday.  She also requests Valium which she takes at home.  Objective: Vital signs in last 24 hours: Temp:  [97.9 F (36.6 C)-98.8 F (37.1 C)] 98.1 F (36.7 C) (07/24 0714) Pulse Rate:  [86-96] 95 (07/24 0714) Cardiac Rhythm: Normal sinus rhythm (07/24 0823) Resp:  [20-24] 22 (07/24 0714) BP: (97-119)/(47-59) 104/54 (07/24 0714) SpO2:  [100 %] 100 % (07/24 0752) Weight:  [145 lb 8.1 oz (66 kg)] 145 lb 8.1 oz (66 kg) (07/24 0251)  Intake/Output from previous day: 07/23 0701 - 07/24 0700 In: 1153.5 [I.V.:1153.5] Out: 205 [Chest Tube:205] Intake/Output this shift: Total I/O In: 129.2 [P.O.:60; I.V.:69.2] Out: 20 [Chest Tube:20]  General appearance: alert, cooperative and no distress Heart: regular rate and rhythm Lungs: clear to auscultation bilaterally Abdomen: soft, tender, + BS Wound: clean and dry  Lab Results:  Recent Labs  10/19/15 0515 10/20/15 0515  WBC 13.2* 10.6*  HGB 9.9* 8.5*  HCT 32.1* 27.2*  PLT 263 222   BMET:  Recent Labs  10/19/15 0515 10/20/15 0515  NA 139 138  K 3.5 3.4*  CL 105 104  CO2 30 29  GLUCOSE 96 88  BUN 17 12  CREATININE 0.48 0.44  CALCIUM 8.4* 8.1*    PT/INR: No results for input(s): LABPROT, INR in the last 72 hours. ABG    Component Value Date/Time   PHART 7.473 (H) 10/05/2015 2223   HCO3 26.7 (H) 10/05/2015 2223   TCO2 24.2 10/05/2015 2223   O2SAT 92.2 10/05/2015 2223   CBG (last 3)   Recent Labs  10/18/15 0407 10/18/15 0814 10/18/15 1133  GLUCAP 99 160* 93    Assessment/Plan: S/P Procedure(s) (LRB): VIDEO ASSISTED THORACOSCOPY (VATS) with Drainage of Pleural  Effusion (Right) DRAINAGE OF PLEURAL EFFUSION (Right)  1. Chest tube- +1 air leak remains present, 205 cc output yesterday- CXR stable, leave on suction today 2. Anxiety- will restart home Valium 3. GI- H/O Pancreatitis with Pseduocyst- GI following, repeat CT scan yesterday.. Shows unchanged appearance in pancreas.. 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 Recommending PET CT 4. Dispo- continue chest tube to suction, GI following, continue current care   LOS: 7 days    Ellwood Handler 10/20/2015  Chart reviewed, patient examined, agree with above. Pathology of pleural biopsies pending.

## 2015-10-20 NOTE — Care Management Important Message (Signed)
Important Message  Patient Details  Name: [name] [name] MRN: [id number] Date of Birth: 1945-09-30   Medicare Important Message Given:  Yes    [name] May 10/20/2015, 11:03 AM

## 2015-10-20 NOTE — Progress Notes (Signed)
Nutrition Follow-up  DOCUMENTATION CODES:   Not applicable  INTERVENTION:    Add Magic cup TID with meals, each supplement provides 290 kcal and 9 grams of protein  Continue Ensure Enlive BID and Prostat TID as tolerated.   NUTRITION DIAGNOSIS:   Inadequate oral intake related to acute illness, other (see comment) (and early satiety and dislike of hospital food) as evidenced by per patient/family report, meal completion < 50%.  Ongoing  GOAL:   Patient will meet greater than or equal to 90% of their needs  Unmet  MONITOR:   PO intake, Weight trends, Supplement acceptance, Labs, Skin, I & O's  ASSESSMENT:   70 year old female with history of COPD, HTN, HLD, anxiety, and depression. She feels miserable because of a bowel pain and depressed and also has some Cushing features. She blames all her problems on prednisone. She presented to the pulmonary office today with complaints of worsening Dyspnea. CXR indicates worsening of the right exudative pleural effusion.  RN requested RD add Magic cups to meal trays as patient is eating very little. She likes chocolate Ensure, but says it is making her bloated. She takes some of the Prostat supplement. Patient moaning in pain during RD visit, RN aware.   Labs reviewed: potassium low  Diet Order:  Diet Heart Room service appropriate?: Yes; Fluid consistency:: Thin  Skin:  Reviewed, no issues  Last BM:  7/23  Height:   Ht Readings from Last 1 Encounters:  10/22/2015 5\' 3"  (1.6 m)    Weight:   Wt Readings from Last 1 Encounters:  10/20/15 145 lb 8.1 oz (66 kg)    Ideal Body Weight:  52.3 kg  BMI:  Body mass index is 25.77 kg/m.  Estimated Nutritional Needs:   Kcal:  1600-1800  Protein:  90-100 grams  Fluid:  1.6-1.8 L/day  EDUCATION NEEDS:   No education needs identified at this time  Molli Barrows, [place], [place], [place] Pager [phone] After Hours Pager [phone]

## 2015-10-20 NOTE — Care Management Note (Signed)
Case Management Note  Patient Details  Name: [name] [name] MRN: [id number] Date of Birth: 1945-10-27  Subjective/Objective:  Patient is post op day 3 for vats with drainage of pl effusion, conts with chest tube to suction.  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  Conts on ivf's ,  And received potassium iv x 3 today.  Plan is for home with [facility] services resumed by [facility].  NCM will cont to follow for dc needs.  Patient wants to know if she can get the smaller portable oxygen through [facility], NCM will check with rep tomorrow .                 Action/Plan:   Expected Discharge Date:                  Expected Discharge Plan:  [place]  In-House Referral:     Discharge planning Services  CM Consult  Post Acute Care Choice:  Resumption of Svcs/PTA Provider Choice offered to:  Patient  DME Arranged:  Oxygen DME Agency:  [employer] (home oxygen)  HH Arranged:  RN, PT HH Agency:     Status of Service:  In process, will continue to follow  If discussed at Long Length of Stay Meetings, dates discussed:    Additional Comments:  Zenon Mayo, RN 10/20/2015, 5:52 PM

## 2015-10-20 NOTE — Progress Notes (Signed)
Name: [name] [name] MRN: [id number] DOB: Aug 16, 1945    ADMISSION DATE:  10/08/2015  CHIEF COMPLAINT:  Dyspnea  BRIEF PATIENT DESCRIPTION: 70 year old female with R hydropneumothorax admitted from office for CVTS evaluation.  STUDIES:  09/16/15 TTE: Normal LV size with mild LV hypertrophy, EF 65-70%. Normal RV size and systolic function. No significant valvular abnormalities. 7/07 CT Chest: moderate R hydropneumothorax, abnormal pleural thickening with scattered areas of pleural fluid in the R hemithorax, additional subpleural reticulation / fibrosis with interlobular septal thickening R>L, fluid along the pancreatic body/tail related to known acute pancreatitis 7/03 Cytology from [facility]: acute inflammation, no malignant cells 7/07 Cytology from [facility]: no malignant cells     MICROBIOLOGY:  Urine Ctx 7/2:  Recommended recollection Urine Ctx 7/3:  Recommended recollection R Pleural Fluid 7/3:  Negative R Pleural Fluid 7/6:  Negative MRSA PCR 7/20:  Negative R Pleural Fluid 7/21> neg gm stain>> R Parietal Pleural Biopsy 7/21> neg gm stain>>  ANTIBIOTICS: Cefuroxime 7/20 - 7/21 (surgical prophylaxis)   AUTOIMMUNE: ESR 7/7: 45 GBM 7/7: 3 ANCA 7/7: negative  RIGHT PLEURAL FLUID:  7/3 Glucose:  100 LDH:  403 Total Protein:  <3.0 Amylase:  26 WBC:  341 (40% lymph, 1% eos, 26% neutro, & 23% mono/macro)  7/6 results:Glucose:  72, LDH:  336, Total Protein:  <3.0  SUBJECTIVE:   Has worsening of upper quadrant pain the last 12-24 hrs.  Cant tolerate PO diet. GI has seen pt.  Abd pain radiates to shoulder.  Has nausea and vomiting.       VITAL SIGNS:  On 3lpm NP Temp:  [97.9 F (36.6 C)-98.8 F (37.1 C)] 98.1 F (36.7 C) (07/24 0714) Pulse Rate:  [86-107] 107 (07/24 1045) Resp:  [20-24] 22 (07/24 0714) BP: (97-119)/(47-59) 113/59 (07/24 1045) SpO2:  [100 %] 100 % (07/24 0752) Weight:  [66 kg (145 lb 8.1 oz)] 66 kg (145 lb 8.1 oz) (07/24 0251)  Physical  Exam:  General: Elderly female at 30 degrees HOB/  Uncomfortable holding L and R upper abdomne 2/2 pain > getting pain meds now HEENT: Moist mucus membranes. No scleral injection or icterus. Pupils symmetric.  Cardiac: Tachycardic. Regular rhythm. (-) s3/m/r/g Pulmonary: Clear bilaterally to auscultation.  Right chest tube in place with respiratory  Variation  Abdomen:  Mod distended. Dec BS. Soft. Difficult to examine as pt withdraws the minute you palpate 2/2 pain Neurological:  CN in tact. No meningismus. Grossly nonfocal. Integument:  Warm & dry. No rash on exposed skin.   Recent Labs Lab 10/18/15 0600 10/19/15 0515 10/20/15 0515  NA 137 139 138  K 3.6 3.5 3.4*  CL 103 105 104  CO2 '28 30 29  '$ BUN '10 17 12  '$ CREATININE 0.45 0.48 0.44  GLUCOSE 112* 96 88    Recent Labs Lab 10/18/15 0600 10/19/15 0515 10/20/15 0515  HGB 9.6* 9.9* 8.5*  HCT 30.4* 32.1* 27.2*  WBC 12.3* 13.2* 10.6*  PLT 284 263 222   Ct Abdomen Pelvis W Contrast  Result Date: 10/20/2015 CLINICAL DATA:  70 year old female with history of pancreatitis presenting with abdominal pain. EXAM: CT ABDOMEN AND PELVIS WITH CONTRAST TECHNIQUE: Multidetector CT imaging of the abdomen and pelvis was performed using the standard protocol following bolus administration of intravenous contrast. CONTRAST:  189m ISOVUE-300 IOPAMIDOL (ISOVUE-300) INJECTION 61% COMPARISON:  CT dated 09/29/2015 FINDINGS: There is a partially visualized small left pleural effusion which appears larger compared the prior study. There is associated partial compressive atelectasis  of the visualized left lower lobe. Superimposed pneumonia is not excluded. There is a right sided pneumothorax. A chest tube is noted in the right posterior pleural surface. There is a small right pleural effusion. No intra-abdominal free air or free fluid. Stable appearing hypodense lesions in the left lobe of the liver measure up to 2.7 cm and demonstrates a fluid attenuation,  likely cysts. There is apparent fatty infiltration of the liver. Cholecystectomy. There multiple line in mid collection adjacent to the pancreas with the largest collection measures approximately 3.3 x 2.7 cm similar to the prior study in the superior aspect of the proximal body of the pancreas. Multiple other or fluid collection along the pancreatic body noted which are grossly similar to the prior study. There is abutment of the gastric wall with loss of fat plane between the stomach and the pancreas. Small amount of fluid is along the greater curvature of the stomach. The spleen appears unremarkable. Indeterminate bilateral adrenal nodules, likely adenoma. There is no hydronephrosis on either side. The visualized ureters and urinary bladder appear unremarkable. Hysterectomy. There is mild thickened appearance of the gastric fundus, likely reactive to inflammatory changes of the pancreas. There is no evidence of bowel obstruction. Appendectomy. There is aortoiliac atherosclerotic disease. There is a 2.7 cm infrarenal abdominal aortic ectasia. The origins of the celiac axis, SMA, IMA as well as the origins of the renal arteries are patent. The SMV and the main portal vein are patent. There is focal narrowing of the proximal aspect of the splenic vein. The splenic vein however remains patent. There is no adenopathy. There is mild subcutaneous soft tissue edema of the abdominal wall. There is a 1.3 x 3.4 cm lytic lesion in the right acetabulum compatible with metastatic disease. Further evaluation with PET-CT is recommended. No acute fracture. IMPRESSION: Partially visualized small right pneumothorax status post chest tube placement. Partially visualized small left pleural effusion. Multiple pancreatic pseudocysts with no significant interval change since the prior study. There is abutment of the stomach with loss of fat plane between the stomach and the pancreas. Scattered colonic diverticula without active  inflammation. No bowel obstruction. Lytic lesion in the right acetabulum compatible with metastatic disease. Further evaluation with PET-CT recommended. Electronically Signed   By: Anner Crete M.D.   On: 10/20/2015 01:12  Dg Chest Port 1 View  Result Date: 10/20/2015 CLINICAL DATA:  Patient with right chest tube and right-sided chest pain. EXAM: PORTABLE CHEST 1 VIEW COMPARISON:  Chest radiograph 10/19/2015. FINDINGS: Right IJ central venous catheter tip projects over the superior vena cava. Right chest tube remains in place. Multiple monitoring leads overlie the patient. Stable cardiac and mediastinal contours. Small left pleural effusion with underlying basilar opacities. Minimal atelectasis right lung base. Unchanged small tiny right basilar pneumothorax. IMPRESSION: Right chest tube remains in place with tiny right basilar pneumothorax are unchanged. Small left pleural effusion with underlying opacities favored to this represent atelectasis. Electronically Signed   By: Lovey Newcomer M.D.   On: 10/20/2015 07:25  Dg Chest Port 1 View  Result Date: 10/19/2015 CLINICAL DATA:  Follow-up pneumothorax. EXAM: PORTABLE CHEST 1 VIEW COMPARISON:  October 18, 2015 FINDINGS: A tiny right basilar lateral pneumothorax remains unchanged. The right chest tube is stable as is the right IJ. The cardiomediastinal silhouette is unchanged given difference in positioning. The increased prominence of the ascending thoracic aorta is probably positional. There is a small effusion and atelectasis on the left. No pneumothorax on the left. No other changes.  IMPRESSION: 1. Stable right chest tube. The tiny right basilar pneumothorax remains. No other changes. Electronically Signed   By: Dorise Bullion III M.D   On: 10/19/2015 11:32  Dg Abd Portable 1v  Result Date: 10/18/2015 CLINICAL DATA:  Left lower quadrant abdominal discomfort EXAM: PORTABLE ABDOMEN - 1 VIEW COMPARISON:  CT abdomen pelvis dated 09/29/2015 FINDINGS:  Nonobstructive bowel gas pattern. Cholecystectomy clips. Mild degenerative changes the lumbar spine. IMPRESSION: Unremarkable abdominal radiograph. Electronically Signed   By: Julian Hy M.D.   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 Patient underwent VATS 7/21 c/w empyema with culture and parietal pleural biopsy. Successfully extubated post-operative and transferred to the stepdown unit for further care.   1. Recurrent Right Exudative Pleural Effusion c/w empyema    S/P VATS 7/21 . Following culture and pathology from VATS. Continuing perioperative prophylactic antibiotics.  2. Post-Operative Surgical Pain:  S/P VATS 7/21  Tylenol PO q6hr & prn. Fentanyl IV prn. Ultram PO prn. 3. Loculated Right Pneumothorax:  Chest tube in place s/p VATS. CT on suction > still with air leak.  4. Right Lower Lobe & Left Upper Lobe Masses:  ANCA negative. 5. Acute Hypoxic Respiratory Failure:  Multi-factorial. Continuing incentive spirometry while awake. OOB to chair as tol/ Weaning FiO2 for Sat >92%. 6. Acute on Chronic Pancreatitis w/ Pseudocyst per Radiology and Peripancreatic Fluid/Inflammation per GI.   Concern for pancreatic CA.   Pt with worsening LUQ pain rad to shoulder started 6 weeks pta. Zofran prn nausea.    Rpt abd ct scan > similar per Radiology. Awaiting GI input and recs. Will need a rpt PET scan as outpt. Possible lytic lesion in R acetabulum which was not seen in previous ct scan or PET scan.  7. Anemia:  No signs of active bleeding & Hgb stable. Plan to transfuse for Hgb <7.0 or signs of active bleeding. Trending cell counts daily w/ CBC. 8. Leukocytosis:  Likely secondary to stress response. Trending cell counts daily w/ CBC. 9. Asthma vs COPD:  No signs of exacerbation.  Continuing Breo in place of home [place]. Continuing Xopenex nebs q6hr  scheduled. 10. Hyponatremia:  Mild. Continuing to monitor electrolytes daily.  11. Hyperglycemia:  No h/o DM. Continuing Accu-Checks q4hr w/ SSI per algorithm. Hgb A1c 5.9 (7/17). 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 13. Hyperlipidemia:  Not currently on home medication. Not initiating statin therapy.  14. Coronary Artery Disease:  Continuing ASA '81mg'$  daily & Toprol XL. 15. Depression:  Takes Valium at home. Monitor for signs of withdrawal. 16. Hypertension:  Changed to clonidine 7/23 to help reduce narc dependency post op  17. Ongoing Tobacco Use PTA  Plan for tobacco cessation education prior to discharge. 18. Prophylaxis:  Pepcid PO daily. SCDs in place 19. Diet:  Clear liquid diet & advance as tolerated. 20. Disposition:  Patient to remain in SDU post-operatively. PT consulted & following.    FAMILY  - Updates: Updated daughter Cristal Ford over the phone today. Will keep in SDU.    - Inter-disciplinary family meet or Palliative Care meeting due by:  7/24    J. Shirl Harris, MD 10/20/2015, 10:56 AM [facility] Pager [phone] After 3 pm or if no answer, call [phone]

## 2015-10-20 NOTE — Progress Notes (Signed)
Daily Rounding Note  10/20/2015, 9:26 AM  LOS: 7 days   SUBJECTIVE:   Chief complaint:  LUQ pain.     Says pain is better today.  Increased diarrhea after the oral contrast.  Nausea, anorexia.  Diet is heart healthy.   OBJECTIVE:         Vital signs in last 24 hours:    Temp:  [97.9 F (36.6 C)-98.8 F (37.1 C)] 98.1 F (36.7 C) (07/24 0714) Pulse Rate:  [86-96] 95 (07/24 0714) Resp:  [20-24] 22 (07/24 0714) BP: (97-119)/(47-59) 104/54 (07/24 0714) SpO2:  [100 %] 100 % (07/24 0752) Weight:  [66 kg (145 lb 8.1 oz)] 66 kg (145 lb 8.1 oz) (07/24 0251) Last BM Date: 10/19/15 Filed Weights   10/16/15 0552 10/16/2015 0514 10/20/15 0251  Weight: 66.9 kg (147 lb 8 oz) 65.5 kg (144 lb 4.8 oz) 66 kg (145 lb 8.1 oz)   General: pale, ill, uncomfortable.  Looks miserable.     Heart: RRR Chest: clear bil in front,  No labored breathing or cough.  CT in place on right Abdomen: soft, BS active.  Tender on left, no guard or rebound.  Soft, not protuberant.  No fluid wave  Extremities: no CCE.  Neuro/Psych:  Oriented x 3.  Tremulous in UE but no asterixis.   Moves all 4s.  Strength not tested.  Depressed, anxious, fretful.   Intake/Output from previous day: 07/23 0701 - 07/24 0700 In: 1153.5 [I.V.:1153.5] Out: 205 [Chest Tube:205]  Intake/Output this shift: Total I/O In: 129.2 [P.O.:60; I.V.:69.2] Out: 20 [Chest Tube:20]  Lab Results:  Recent Labs  10/18/15 0600 10/19/15 0515 10/20/15 0515  WBC 12.3* 13.2* 10.6*  HGB 9.6* 9.9* 8.5*  HCT 30.4* 32.1* 27.2*  PLT 284 263 222   BMET  Recent Labs  10/18/15 0600 10/19/15 0515 10/20/15 0515  NA 137 139 138  K 3.6 3.5 3.4*  CL 103 105 104  CO2 28 30 29   GLUCOSE 112* 96 88  BUN 10 17 12   CREATININE 0.45 0.48 0.44  CALCIUM 8.4* 8.4* 8.1*   LFT  Recent Labs  10/18/15 0600 10/19/15 0515 10/20/15 0515  ALBUMIN 2.0* 2.0* 1.9*   PT/INR No results for input(s):  LABPROT, INR in the last 72 hours. Hepatitis Panel No results for input(s): HEPBSAG, HCVAB, HEPAIGM, HEPBIGM in the last 72 hours.  Studies/Results: Ct Abdomen Pelvis W Contrast  Result Date: 10/20/2015 CLINICAL DATA:  70 year old female with history of pancreatitis presenting with abdominal pain. EXAM: CT ABDOMEN AND PELVIS WITH CONTRAST TECHNIQUE: Multidetector CT imaging of the abdomen and pelvis was performed using the standard protocol following bolus administration of intravenous contrast. CONTRAST:  181mL ISOVUE-300 IOPAMIDOL (ISOVUE-300) INJECTION 61% COMPARISON:  CT dated 09/29/2015 FINDINGS: There is a partially visualized small left pleural effusion which appears larger compared the prior study. There is associated partial compressive atelectasis of the visualized left lower lobe. Superimposed pneumonia is not excluded. There is a right sided pneumothorax. A chest tube is noted in the right posterior pleural surface. There is a small right pleural effusion. No intra-abdominal free air or free fluid. Stable appearing hypodense lesions in the left lobe of the liver measure up to 2.7 cm and demonstrates a fluid attenuation, likely cysts. There is apparent fatty infiltration of the liver. Cholecystectomy. There multiple line in mid collection adjacent to the pancreas with the largest collection measures approximately 3.3 x 2.7 cm similar to the prior study in  the superior aspect of the proximal body of the pancreas. Multiple other or fluid collection along the pancreatic body noted which are grossly similar to the prior study. There is abutment of the gastric wall with loss of fat plane between the stomach and the pancreas. Small amount of fluid is along the greater curvature of the stomach. The spleen appears unremarkable. Indeterminate bilateral adrenal nodules, likely adenoma. There is no hydronephrosis on either side. The visualized ureters and urinary bladder appear unremarkable. Hysterectomy.  There is mild thickened appearance of the gastric fundus, likely reactive to inflammatory changes of the pancreas. There is no evidence of bowel obstruction. Appendectomy. There is aortoiliac atherosclerotic disease. There is a 2.7 cm infrarenal abdominal aortic ectasia. The origins of the celiac axis, SMA, IMA as well as the origins of the renal arteries are patent. The SMV and the main portal vein are patent. There is focal narrowing of the proximal aspect of the splenic vein. The splenic vein however remains patent. There is no adenopathy. There is mild subcutaneous soft tissue edema of the abdominal wall. There is a 1.3 x 3.4 cm lytic lesion in the right acetabulum compatible with metastatic disease. Further evaluation with PET-CT is recommended. No acute fracture. IMPRESSION: Partially visualized small right pneumothorax status post chest tube placement. Partially visualized small left pleural effusion. Multiple pancreatic pseudocysts with no significant interval change since the prior study. There is abutment of the stomach with loss of fat plane between the stomach and the pancreas. Scattered colonic diverticula without active inflammation. No bowel obstruction. Lytic lesion in the right acetabulum compatible with metastatic disease. Further evaluation with PET-CT recommended. Electronically Signed   By: Anner Crete M.D.   On: 10/20/2015 01:12  Dg Chest Port 1 View  Result Date: 10/20/2015 CLINICAL DATA:  Patient with right chest tube and right-sided chest pain. EXAM: PORTABLE CHEST 1 VIEW COMPARISON:  Chest radiograph 10/19/2015. FINDINGS: Right IJ central venous catheter tip projects over the superior vena cava. Right chest tube remains in place. Multiple monitoring leads overlie the patient. Stable cardiac and mediastinal contours. Small left pleural effusion with underlying basilar opacities. Minimal atelectasis right lung base. Unchanged small tiny right basilar pneumothorax. IMPRESSION: Right  chest tube remains in place with tiny right basilar pneumothorax are unchanged. Small left pleural effusion with underlying opacities favored to this represent atelectasis. Electronically Signed   By: Lovey Newcomer M.D.   On: 10/20/2015 07:25  Dg Chest Port 1 View  Result Date: 10/19/2015 CLINICAL DATA:  Follow-up pneumothorax. EXAM: PORTABLE CHEST 1 VIEW COMPARISON:  October 18, 2015 FINDINGS: A tiny right basilar lateral pneumothorax remains unchanged. The right chest tube is stable as is the right IJ. The cardiomediastinal silhouette is unchanged given difference in positioning. The increased prominence of the ascending thoracic aorta is probably positional. There is a small effusion and atelectasis on the left. No pneumothorax on the left. No other changes. IMPRESSION: 1. Stable right chest tube. The tiny right basilar pneumothorax remains. No other changes. Electronically Signed   By: Dorise Bullion III M.D   On: 10/19/2015 11:32  Dg Abd Portable 1v  Result Date: 10/18/2015 CLINICAL DATA:  Left lower quadrant abdominal discomfort EXAM: PORTABLE ABDOMEN - 1 VIEW COMPARISON:  CT abdomen pelvis dated 09/29/2015 FINDINGS: Nonobstructive bowel gas pattern. Cholecystectomy clips. Mild degenerative changes the lumbar spine. IMPRESSION: Unremarkable abdominal radiograph. Electronically Signed   By: Julian Hy M.D.   On: 10/18/2015 23:48    ASSESMENT:   *  Pancreatitis  of unclear etiology.  Increased size and # of peripancreatic fluid collections c/w 09/23/15 CT #1, though no interval change on 7/3 and 7/24 CT scans.  Unable to rule out cystic neoplasm.  Initial clinical presentation was 08/2015.  Previous cholecystectomy 2011.  Left abdominal pain improved but persists along with nausea and anorexia.   *  10/05/2015 VATS assisted right empyema drainage, WBCs but no organisms on stain.  Chest tube in place. 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 Cytology 7/3 and 7/6,  negative for cancer.   *  Lytic lesion right hip.  Will need PET-CT but this is an outpt study.    *  Depression, anxiety.   *  Hypoalbuminemia.  Protein cal malnutrition suspected.  Ensure formula bid in place.   *  Anemia, borderline macrocytic.    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   Also IBS-D.   Colonoscopy 06/2014: benign cecal polyp, normal random biopsies, non-bleeding cecal AVM.   *  Hemochromatosis.   *  Wegener's granulomatosis, polyangiitis. P-ANCA elevated at 1:1.6 on 07/23/15, normal at <1:20 on 10/03/15.  Receiving weekly Rituxan infusions through rheumatologist, Dr [name] for a few weeks PTA.      PLAN   *  Will check Prealbumin, CEA and CA 19-9 in AM.    *  Discontinue HS Senokot.  ? Switch to lesser diet, however she has no appetite for broth/clears.       Azucena Freed  10/20/2015, 9:26 AM Pager: [phone]

## 2015-10-21 ENCOUNTER — Inpatient Hospital Stay (HOSPITAL_COMMUNITY): Payer: Medicare Other

## 2015-10-21 DIAGNOSIS — J939 Pneumothorax, unspecified: Secondary | ICD-10-CM

## 2015-10-21 LAB — CBC WITH DIFFERENTIAL/PLATELET
Basophils Absolute: 0 10*3/uL (ref 0.0–0.1)
Basophils Relative: 0 %
EOS PCT: 4 %
Eosinophils Absolute: 0.4 10*3/uL (ref 0.0–0.7)
HCT: 30.5 % — ABNORMAL LOW (ref 36.0–46.0)
Hemoglobin: 9.5 g/dL — ABNORMAL LOW (ref 12.0–15.0)
LYMPHS ABS: 1.7 10*3/uL (ref 0.7–4.0)
LYMPHS PCT: 15 %
MCH: 31.5 pg (ref 26.0–34.0)
MCHC: 31.1 g/dL (ref 30.0–36.0)
MCV: 101 fL — AB (ref 78.0–100.0)
MONO ABS: 1.2 10*3/uL — AB (ref 0.1–1.0)
MONOS PCT: 10 %
Neutro Abs: 8.2 10*3/uL — ABNORMAL HIGH (ref 1.7–7.7)
Neutrophils Relative %: 71 %
PLATELETS: 249 10*3/uL (ref 150–400)
RBC: 3.02 MIL/uL — AB (ref 3.87–5.11)
RDW: 16.9 % — ABNORMAL HIGH (ref 11.5–15.5)
WBC: 11.6 10*3/uL — AB (ref 4.0–10.5)

## 2015-10-21 LAB — GLUCOSE, CAPILLARY
GLUCOSE-CAPILLARY: 127 mg/dL — AB (ref 65–99)
Glucose-Capillary: 115 mg/dL — ABNORMAL HIGH (ref 65–99)
Glucose-Capillary: 131 mg/dL — ABNORMAL HIGH (ref 65–99)

## 2015-10-21 LAB — RENAL FUNCTION PANEL
ANION GAP: 6 (ref 5–15)
Albumin: 1.9 g/dL — ABNORMAL LOW (ref 3.5–5.0)
BUN: 12 mg/dL (ref 6–20)
CHLORIDE: 106 mmol/L (ref 101–111)
CO2: 26 mmol/L (ref 22–32)
CREATININE: 0.51 mg/dL (ref 0.44–1.00)
Calcium: 8.1 mg/dL — ABNORMAL LOW (ref 8.9–10.3)
GFR calc non Af Amer: >60 mL/min (ref >60)
Glucose, Bld: 99 mg/dL (ref 65–99)
Phosphorus: 3.1 mg/dL (ref 2.5–4.6)
Potassium: 3.9 mmol/L (ref 3.5–5.1)
Sodium: 138 mmol/L (ref 135–145)

## 2015-10-21 LAB — PREALBUMIN: PREALBUMIN: 16.2 mg/dL — AB (ref 18–38)

## 2015-10-21 LAB — MAGNESIUM: Magnesium: 1.9 mg/dL (ref 1.7–2.4)

## 2015-10-21 MED ORDER — JEVITY 1.2 CAL PO LIQD: 1000.0000 mL | ORAL | Status: DC

## 2015-10-21 MED ORDER — DIAZEPAM 5 MG PO TABS
2.5000 mg | ORAL_TABLET | Freq: Three times a day (TID) | ORAL | Status: DC | PRN
  Administered 2015-10-21 – 2015-10-22 (×2): 5 mg via ORAL
  Filled 2015-10-21 (×2): qty 1

## 2015-10-21 MED ORDER — VITAL AF 1.2 CAL PO LIQD
1000.0000 mL | ORAL | Status: DC
  Administered 2015-10-21 – 2015-10-22 (×2): 1000 mL
  Filled 2015-10-21 (×7): qty 1000

## 2015-10-21 NOTE — Consult Note (Signed)
   [facility] CM Inpatient Consult   10/21/2015  [name] [name] 02-27-1946 [id number]     Patient is currently active with [facility] program for long-term disease management services. Patient will receive post hospital discharge transition of care calls and will be assessed for monthly home visits for assessments and for education. Will make inpatient RNCM aware patient is active with [facility]. Please see chart review tab then notes in EPIC for further patient outreach details by [facility].   [name], MSN-Ed, RN,BSN [facility] Liaison [phone]

## 2015-10-21 NOTE — Progress Notes (Signed)
   10/21/15 1400  Clinical Encounter Type  Visited With Patient  Visit Type Initial  Referral From Nurse  Consult/Referral To Chaplain  Spiritual Encounters  Spiritual Needs Emotional;Prayer  Stress Factors  Patient Stress Factors Loss of control  CHP visited with patient who said she was depressed.  Offer presence, listening, emotional support, encouragement and prayer. CHP available for follow up as needed. Roe Coombs 10/21/15

## 2015-10-21 NOTE — Progress Notes (Signed)
Daily Rounding Note  10/21/2015, 8:47 AM  LOS: 8 days   SUBJECTIVE:   Chief complaint: abdominal pain.    Fentanyl 200 mg, Tramadol 50 mg in previous 24 hours.  LUQ abdominal pain persists, worse with breathing, eating, twisting.  3 or so soft BMs yesterday, one this AM.   Anxious about but accepting of feeding tube placement.    OBJECTIVE:         Vital signs in last 24 hours:    Temp:  [97.6 F (36.4 C)-98.6 F (37 C)] 97.7 F (36.5 C) (07/25 0715) Pulse Rate:  [94-107] 100 (07/25 0715) Resp:  [20-30] 30 (07/25 0715) BP: (101-113)/(48-59) 108/57 (07/25 0715) SpO2:  [93 %-100 %] 94 % (07/25 0715) Last BM Date: 10/20/15 Filed Weights   10/16/15 0552 09/30/2015 0514 10/20/15 0251  Weight: 66.9 kg (147 lb 8 oz) 65.5 kg (144 lb 4.8 oz) 66 kg (145 lb 8.1 oz)   General: fretful, ill, uncomfortable.    Heart: RRR Chest: clear bil.  Diminished on right.  Right chest tube in place.  Abdomen: soft, ND.  Tender on left.  Hypoactive BS  Extremities: no CCE Neuro/Psych:  Anxious, depressed.  Moves all 4s.  + tremor.    Intake/Output from previous day: 07/24 0701 - 07/25 0700 In: 1983 [P.O.:630; I.V.:1203; IV Piggyback:150] Out: 765 [Urine:525; Chest Tube:240]  Intake/Output this shift: No intake/output data recorded.  Lab Results:  Recent Labs  10/19/15 0515 10/20/15 0515 10/21/15 0430  WBC 13.2* 10.6* 11.6*  HGB 9.9* 8.5* 9.5*  HCT 32.1* 27.2* 30.5*  PLT 263 222 249   BMET  Recent Labs  10/19/15 0515 10/20/15 0515 10/21/15 0436  NA 139 138 138  K 3.5 3.4* 3.9  CL 105 104 106  CO2 30 29 26   GLUCOSE 96 88 99  BUN 17 12 12   CREATININE 0.48 0.44 0.51  CALCIUM 8.4* 8.1* 8.1*   LFT  Recent Labs  10/19/15 0515 10/20/15 0515 10/21/15 0436  ALBUMIN 2.0* 1.9* 1.9*   PT/INR No results for input(s): LABPROT, INR in the last 72 hours. Hepatitis Panel No results for input(s): HEPBSAG, HCVAB, HEPAIGM,  HEPBIGM in the last 72 hours.  Studies/Results: Ct Abdomen Pelvis W Contrast  Result Date: 10/20/2015 CLINICAL DATA:  70 year old female with history of pancreatitis presenting with abdominal pain. EXAM: CT ABDOMEN AND PELVIS WITH CONTRAST TECHNIQUE: Multidetector CT imaging of the abdomen and pelvis was performed using the standard protocol following bolus administration of intravenous contrast. CONTRAST:  168mL ISOVUE-300 IOPAMIDOL (ISOVUE-300) INJECTION 61% COMPARISON:  CT dated 09/29/2015 FINDINGS: There is a partially visualized small left pleural effusion which appears larger compared the prior study. There is associated partial compressive atelectasis of the visualized left lower lobe. Superimposed pneumonia is not excluded. There is a right sided pneumothorax. A chest tube is noted in the right posterior pleural surface. There is a small right pleural effusion. No intra-abdominal free air or free fluid. Stable appearing hypodense lesions in the left lobe of the liver measure up to 2.7 cm and demonstrates a fluid attenuation, likely cysts. There is apparent fatty infiltration of the liver. Cholecystectomy. There multiple line in mid collection adjacent to the pancreas with the largest collection measures approximately 3.3 x 2.7 cm similar to the prior study in the superior aspect of the proximal body of the pancreas. Multiple other or fluid collection along the pancreatic body noted which are grossly similar to the prior study. There  is abutment of the gastric wall with loss of fat plane between the stomach and the pancreas. Small amount of fluid is along the greater curvature of the stomach. The spleen appears unremarkable. Indeterminate bilateral adrenal nodules, likely adenoma. There is no hydronephrosis on either side. The visualized ureters and urinary bladder appear unremarkable. Hysterectomy. There is mild thickened appearance of the gastric fundus, likely reactive to inflammatory changes of the  pancreas. There is no evidence of bowel obstruction. Appendectomy. There is aortoiliac atherosclerotic disease. There is a 2.7 cm infrarenal abdominal aortic ectasia. The origins of the celiac axis, SMA, IMA as well as the origins of the renal arteries are patent. The SMV and the main portal vein are patent. There is focal narrowing of the proximal aspect of the splenic vein. The splenic vein however remains patent. There is no adenopathy. There is mild subcutaneous soft tissue edema of the abdominal wall. There is a 1.3 x 3.4 cm lytic lesion in the right acetabulum compatible with metastatic disease. Further evaluation with PET-CT is recommended. No acute fracture. IMPRESSION: Partially visualized small right pneumothorax status post chest tube placement. Partially visualized small left pleural effusion. Multiple pancreatic pseudocysts with no significant interval change since the prior study. There is abutment of the stomach with loss of fat plane between the stomach and the pancreas. Scattered colonic diverticula without active inflammation. No bowel obstruction. Lytic lesion in the right acetabulum compatible with metastatic disease. Further evaluation with PET-CT recommended. Electronically Signed   By: Anner Crete M.D.   On: 10/20/2015 01:12  Dg Chest Port 1 View  Result Date: 10/21/2015 CLINICAL DATA:  Chest tube. EXAM: PORTABLE CHEST 1 VIEW COMPARISON:  10/20/2015. FINDINGS: Right IJ line and right chest tube in stable position. Small right basilar pneumothorax remains unchanged. Mild bibasilar atelectasis. Small left pleural effusion unchanged. Heart size normal. No acute bony abnormality. IMPRESSION: 1. Right chest tube in stable position. Small right basilar pneumothorax again noted. No interim change. Right IJ line in stable position. 2. Mild bibasilar atelectasis with stable small left pleural effusion again noted. Electronically Signed   By: Marcello Moores  Register   On: 10/21/2015 07:47  Dg Chest  Port 1 View  Result Date: 10/20/2015 CLINICAL DATA:  Patient with right chest tube and right-sided chest pain. EXAM: PORTABLE CHEST 1 VIEW COMPARISON:  Chest radiograph 10/19/2015. FINDINGS: Right IJ central venous catheter tip projects over the superior vena cava. Right chest tube remains in place. Multiple monitoring leads overlie the patient. Stable cardiac and mediastinal contours. Small left pleural effusion with underlying basilar opacities. Minimal atelectasis right lung base. Unchanged small tiny right basilar pneumothorax. IMPRESSION: Right chest tube remains in place with tiny right basilar pneumothorax are unchanged. Small left pleural effusion with underlying opacities favored to this represent atelectasis. Electronically Signed   By: Lovey Newcomer M.D.   On: 10/20/2015 07:25   ASSESMENT:   *  Pancreatitis of unclear etiology.  Increased size and # of peripancreatic fluid collections c/w 09/23/15 CT #1, though no interval change on 7/3 and 7/24 CT scans.  Unable to rule out cystic neoplasm.  Initial clinical presentation was 08/2015.  Previous cholecystectomy 2011.  Left abdominal pain improved but persists along with nausea and anorexia.   *  10/09/2015 VATS assisted right empyema drainage, WBCs but no organisms on stain.  Chest tube in place. 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 Cytology 7/3 and 7/6, negative for cancer.   *  Lytic lesion right  hip.  07/07/15 PET-CT: hypermetabolic activity in right lung (subsequently proven not to have cancer), left ribs, nothing in hips.  CEA, CA 19-9 processing.  May need another PET scan as outpt.   *  Depression, anxiety.   *  Hypoalbuminemia.  Protein cal malnutrition, prealbumin 16.2.  Ensure formula bid in place.   *  Anemia, macrocytic.  Hemochromatosis listed in PMHx  *  Chronic diarrhea, suspected post cholecystectomy related.   ? IBS-D.   Colonoscopy 06/2014: benign cecal polyp, normal random biopsies,  non-bleeding cecal AVM.   *  Wegener's granulomatosis, polyangiitis. P-ANCA elevated at 1:1.6 on 07/23/15, normal at <1:20 on 10/03/15.  Receiving weekly Rituxan infusions through rheumatologist, Dr [name] for a few weeks PTA.       PLAN   *  B12, Folate in AM.  Place NGT today.  Have RD advise and oversee tube feedings (a concern is that TF may increase her chronic issues with diarrhea.  Pt will not need to be NPO once FT placed.     *  Consider psych consult re situational depression, anxiety.  In meantime I changed Valium to q 8 PRN instead of q 12 PRN.     Azucena Freed  10/21/2015, 8:47 AM Pager: [phone]

## 2015-10-21 NOTE — Consult Note (Signed)
Ch attempted to visit twice - fortunately, patient has had a guest a majority of day. [place] will monitor and follow-up. Gwynn Burly 1:04 PM

## 2015-10-21 NOTE — Progress Notes (Signed)
Name: [name] [name] MRN: [id number] DOB: 1946/02/14    ADMISSION DATE:  10/14/2015  CHIEF COMPLAINT:  Dyspnea  BRIEF PATIENT DESCRIPTION: 70 year old female with R hydropneumothorax admitted from office for CVTS evaluation.  STUDIES:  09/16/15 TTE: Normal LV size with mild LV hypertrophy, EF 65-70%. Normal RV size and systolic function. No significant valvular abnormalities. 7/07 CT Chest: moderate R hydropneumothorax, abnormal pleural thickening with scattered areas of pleural fluid in the R hemithorax, additional subpleural reticulation / fibrosis with interlobular septal thickening R>L, fluid along the pancreatic body/tail related to known acute pancreatitis 7/03 Cytology from [facility]: acute inflammation, no malignant cells 7/07 Cytology from [facility]: no malignant cells    MICROBIOLOGY:  Urine Ctx 7/2:  Recommended recollection Urine Ctx 7/3:  Recommended recollection R Pleural Fluid 7/3:  Negative R Pleural Fluid 7/6:  Negative MRSA PCR 7/20:  Negative R Pleural Fluid 7/21> neg gm stain>> R Parietal Pleural Biopsy 7/21> neg gm stain>>  ANTIBIOTICS: Cefuroxime 7/20 - 7/21 (surgical prophylaxis)   SUBJECTIVE:   Abd pain some significantly better the last 12 hrs.  Less nausea Less SOB, pleuritic cp  No issues overnight    VITAL SIGNS:   Temp:  [97.6 F (36.4 C)-98.6 F (37 C)] 97.7 F (36.5 C) (07/25 0715) Pulse Rate:  [94-107] 100 (07/25 0715) Resp:  [20-30] 30 (07/25 0715) BP: (101-113)/(48-67) 108/67 (07/25 1029) SpO2:  [93 %-100 %] 97 % (07/25 0849)   Physical Exam: General: Elderly female at 30 degrees HOB. Comfortable. Not in distress.  HEENT: Moist mucus membranes. No scleral injection or icterus. Pupils symmetric.  Cardiac:good s1/s2. Regular rhythm. (-) s3/m/r/g Pulmonary: Clear bilaterally to auscultation.  Right chest tube in place > on water seal.  Abdomen:  Soft. NT/ (-) masses.  Neurological:  CN in tact. No meningismus. Grossly  nonfocal. Integument:  Warm & dry. No rash on exposed skin.   Recent Labs Lab 10/19/15 0515 10/20/15 0515 10/21/15 0436  NA 139 138 138  K 3.5 3.4* 3.9  CL 105 104 106  CO2 30 29 26   BUN 17 12 12   CREATININE 0.48 0.44 0.51  GLUCOSE 96 88 99    Recent Labs Lab 10/19/15 0515 10/20/15 0515 10/21/15 0430  HGB 9.9* 8.5* 9.5*  HCT 32.1* 27.2* 30.5*  WBC 13.2* 10.6* 11.6*  PLT 263 222 249   Ct Abdomen Pelvis W Contrast  Result Date: 10/20/2015 CLINICAL DATA:  70 year old female with history of pancreatitis presenting with abdominal pain. EXAM: CT ABDOMEN AND PELVIS WITH CONTRAST TECHNIQUE: Multidetector CT imaging of the abdomen and pelvis was performed using the standard protocol following bolus administration of intravenous contrast. CONTRAST:  130mL ISOVUE-300 IOPAMIDOL (ISOVUE-300) INJECTION 61% COMPARISON:  CT dated 09/29/2015 FINDINGS: There is a partially visualized small left pleural effusion which appears larger compared the prior study. There is associated partial compressive atelectasis of the visualized left lower lobe. Superimposed pneumonia is not excluded. There is a right sided pneumothorax. A chest tube is noted in the right posterior pleural surface. There is a small right pleural effusion. No intra-abdominal free air or free fluid. Stable appearing hypodense lesions in the left lobe of the liver measure up to 2.7 cm and demonstrates a fluid attenuation, likely cysts. There is apparent fatty infiltration of the liver. Cholecystectomy. There multiple line in mid collection adjacent to the pancreas with the largest collection measures approximately 3.3 x 2.7 cm similar to the prior study in the superior aspect of the proximal body  of the pancreas. Multiple other or fluid collection along the pancreatic body noted which are grossly similar to the prior study. There is abutment of the gastric wall with loss of fat plane between the stomach and the pancreas. Small amount of fluid  is along the greater curvature of the stomach. The spleen appears unremarkable. Indeterminate bilateral adrenal nodules, likely adenoma. There is no hydronephrosis on either side. The visualized ureters and urinary bladder appear unremarkable. Hysterectomy. There is mild thickened appearance of the gastric fundus, likely reactive to inflammatory changes of the pancreas. There is no evidence of bowel obstruction. Appendectomy. There is aortoiliac atherosclerotic disease. There is a 2.7 cm infrarenal abdominal aortic ectasia. The origins of the celiac axis, SMA, IMA as well as the origins of the renal arteries are patent. The SMV and the main portal vein are patent. There is focal narrowing of the proximal aspect of the splenic vein. The splenic vein however remains patent. There is no adenopathy. There is mild subcutaneous soft tissue edema of the abdominal wall. There is a 1.3 x 3.4 cm lytic lesion in the right acetabulum compatible with metastatic disease. Further evaluation with PET-CT is recommended. No acute fracture. IMPRESSION: Partially visualized small right pneumothorax status post chest tube placement. Partially visualized small left pleural effusion. Multiple pancreatic pseudocysts with no significant interval change since the prior study. There is abutment of the stomach with loss of fat plane between the stomach and the pancreas. Scattered colonic diverticula without active inflammation. No bowel obstruction. Lytic lesion in the right acetabulum compatible with metastatic disease. Further evaluation with PET-CT recommended. Electronically Signed   By: Anner Crete M.D.   On: 10/20/2015 01:12  Dg Chest Port 1 View  Result Date: 10/21/2015 CLINICAL DATA:  Chest tube. EXAM: PORTABLE CHEST 1 VIEW COMPARISON:  10/20/2015. FINDINGS: Right IJ line and right chest tube in stable position. Small right basilar pneumothorax remains unchanged. Mild bibasilar atelectasis. Small left pleural effusion  unchanged. Heart size normal. No acute bony abnormality. IMPRESSION: 1. Right chest tube in stable position. Small right basilar pneumothorax again noted. No interim change. Right IJ line in stable position. 2. Mild bibasilar atelectasis with stable small left pleural effusion again noted. Electronically Signed   By: Marcello Moores  Register   On: 10/21/2015 07:47  Dg Chest Port 1 View  Result Date: 10/20/2015 CLINICAL DATA:  Patient with right chest tube and right-sided chest pain. EXAM: PORTABLE CHEST 1 VIEW COMPARISON:  Chest radiograph 10/19/2015. FINDINGS: Right IJ central venous catheter tip projects over the superior vena cava. Right chest tube remains in place. Multiple monitoring leads overlie the patient. Stable cardiac and mediastinal contours. Small left pleural effusion with underlying basilar opacities. Minimal atelectasis right lung base. Unchanged small tiny right basilar pneumothorax. IMPRESSION: Right chest tube remains in place with tiny right basilar pneumothorax are unchanged. Small left pleural effusion with underlying opacities favored to this represent atelectasis. Electronically Signed   By: Lovey Newcomer M.D.   On: 10/20/2015 07:25  ASSESSMENT / PLAN:  70 year old female with recurrent right exudative pleural effusion and acute on chronic pancreatitis with pseudocyst formation. Patient underwent VATS 7/21 c/w empyema with culture and parietal pleural biopsy. Successfully extubated post-operative and transferred to the stepdown unit for further care.   1. Recurrent Right Exudative Pleural Effusion c/w empyema    S/P VATS 7/21 . Following culture and pathology from VATS. CT on water seal. Hopefully out in 24-48 hrs.  2. Post-Operative Surgical Pain:  S/P VATS  7/21  Tylenol PO q6hr & prn. Fentanyl IV prn. Ultram PO prn. 3. 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   4. Acute on Chronic Pancreatitis w/ Pseudocyst per Radiology and Peripancreatic Fluid/Inflammation per GI. Concern for pancreatic cyst CA. Abd pain better controlled. Trial with TF today. Will need MRCP/MRI vs EUS as an outpt.  5. Possible lytic lesion in R acetabulum which was not seen in previous ct scan or PET scan. Plan for rpt PET scan as an outpt.  6. Asthma vs COPD:  No signs of exacerbation.  Continuing Breo in place of home [place]. Continuing Xopenex nebs q8hr scheduled and prn. 7. 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 8. Coronary Artery Disease and  HTN:  Continuing ASA 81mg  daily & Toprol XL. Clonidine added this admission 2/2 HTN related to abd pain. Observe BP.  9. Depression:  Takes Valium at home. Monitor for signs of withdrawal. 10. Ongoing Tobacco Use PTA  Plan for tobacco cessation education prior to discharge. 11. Prophylaxis:  Pepcid PO daily. SCDs in place    FAMILY  - Updates: Updated pt and friend at bedside. Anticipate once CT is out, can transfer to floors.  - Inter-disciplinary family meet or Palliative Care meeting due by:  7/26    J. Shirl Harris, MD 10/21/2015, 10:37 AM [facility] Pager [phone] After 3 pm or if no answer, call [phone]

## 2015-10-21 NOTE — Progress Notes (Addendum)
      [address]       [place] [address]             [phone]      4 Days Post-Op Procedure(s) (LRB): VIDEO ASSISTED THORACOSCOPY (VATS) with Drainage of Pleural Effusion (Right) DRAINAGE OF PLEURAL EFFUSION (Right)   Subjective:  [name] [name] has no new complaints.  She continues to have abdominal pain but states it isn't too bad right now.  She also complains of pain at her chest tube site.  She is not ambulating due to pain.  Objective: Vital signs in last 24 hours: Temp:  [97.6 F (36.4 C)-98.6 F (37 C)] 98.1 F (36.7 C) (07/25 0344) Pulse Rate:  [94-107] 94 (07/25 0344) Cardiac Rhythm: Normal sinus rhythm (07/25 0344) Resp:  [20-28] 24 (07/25 0344) BP: (101-113)/(48-59) 107/48 (07/25 0344) SpO2:  [93 %-100 %] 93 % (07/25 0344)  Intake/Output from previous day: 07/24 0701 - 07/25 0700 In: 1983 [P.O.:630; I.V.:1203; IV Piggyback:150] Out: 765 [Urine:525; Chest Tube:240]  General appearance: alert, cooperative and no distress Heart: regular rate and rhythm Lungs: clear to auscultation bilaterally Abdomen: soft, +tender mid to LUQ; bowel sounds normal; no masses,  no organomegaly Wound: clean and dry  Lab Results:  Recent Labs  10/20/15 0515 10/21/15 0430  WBC 10.6* 11.6*  HGB 8.5* 9.5*  HCT 27.2* 30.5*  PLT 222 249   BMET:  Recent Labs  10/20/15 0515 10/21/15 0436  NA 138 138  K 3.4* 3.9  CL 104 106  CO2 29 26  GLUCOSE 88 99  BUN 12 12  CREATININE 0.44 0.51  CALCIUM 8.1* 8.1*    PT/INR: No results for input(s): LABPROT, INR in the last 72 hours. ABG    Component Value Date/Time   PHART 7.473 (H) 10/05/2015 2223   HCO3 26.7 (H) 10/05/2015 2223   TCO2 24.2 10/05/2015 2223   O2SAT 92.2 10/05/2015 2223   CBG (last 3)   Recent Labs  10/18/15 0814 10/18/15 1133  GLUCAP 160* 93    Assessment/Plan: S/P Procedure(s) (LRB): VIDEO ASSISTED THORACOSCOPY (VATS) with Drainage of Pleural Effusion (Right) DRAINAGE OF PLEURAL  EFFUSION (Right)  1. Chest tube- continues to have small air leak, stable appearance of CXR, 240 cc output- leave chest tube on suction 2. GI- H/O Pancreatitis with Pseudocyst- GI following 3. Dispo- patient stable, leave chest tube to suction, continue current care   LOS: 8 days    Ellwood Handler 10/21/2015  Chart reviewed, patient examined, agree with above. Will put to water seal to see if lung will remain expanded. This may help resolve the air leak faster.

## 2015-10-21 NOTE — Progress Notes (Signed)
Nutrition Follow-up / Consult  DOCUMENTATION CODES:   Not applicable  INTERVENTION:    Once Cortrak tube placed in post-pyloric position, initiate Vital AF 1.2 at 30 ml/h, increase by 10 ml every 4 hours to goal rate of 60 ml/h to provide 1728 kcals, 108 gm protein, 1168 ml free water daily.  NUTRITION DIAGNOSIS:   Inadequate oral intake related to acute illness, other (see comment) (and early satiety and dislike of hospital food) as evidenced by per patient/family report, meal completion < 50%.  Ongoing  GOAL:   Patient will meet greater than or equal to 90% of their needs  Unmet  MONITOR:   PO intake, Weight trends, Supplement acceptance, Labs, Skin, I & O's  REASON FOR ASSESSMENT:   Malnutrition Screening Tool    ASSESSMENT:   70 year old female with history of COPD, HTN, HLD, anxiety, and depression. She feels miserable because of a bowel pain and depressed and also has some Cushing features. She blames all her problems on prednisone. She presented to the pulmonary office today with complaints of worsening Dyspnea. CXR indicates worsening of the right exudative pleural effusion.  Patient with ongoing pancreatitis of unclear etiology. PO intake and appetite is very poor. Patient is consuming </= 25% of meals. She is receiving Ensure and Prostat supplements, but taking minimal amounts. She is also receiving Magic Cups with meals. Plans for post-pyloric placement of Cortrak tube for enteral nutrition. Cortrak being placed today. Received MD Consult for TF initiation and management. Labs reviewed: prealbumin 16.2, low, but not a good indicator of nutrition status with ongoing inflammatory processes. Medications reviewed and include MVI, KCl.  Diet Order:  Diet regular Room service appropriate? Yes; Fluid consistency: Thin  Skin:  Reviewed, no issues  Last BM:  7/24  Height:   Ht Readings from Last 1 Encounters:  10/06/2015 5\' 3"  (1.6 m)    Weight:   Wt Readings  from Last 1 Encounters:  10/20/15 145 lb 8.1 oz (66 kg)    Ideal Body Weight:  52.3 kg  BMI:  Body mass index is 25.77 kg/m.  Estimated Nutritional Needs:   Kcal:  1600-1800  Protein:  90-100 grams  Fluid:  1.6-1.8 L/day  EDUCATION NEEDS:   No education needs identified at this time  Molli Barrows, [place], [facility] [place] Pager [phone] After Hours Pager [phone]

## 2015-10-22 ENCOUNTER — Inpatient Hospital Stay (HOSPITAL_COMMUNITY): Payer: Medicare Other

## 2015-10-22 DIAGNOSIS — K859 Acute pancreatitis without necrosis or infection, unspecified: Secondary | ICD-10-CM | POA: Diagnosis present

## 2015-10-22 LAB — CEA: CEA: 2.9 ng/mL (ref 0.0–4.7)

## 2015-10-22 LAB — GLUCOSE, CAPILLARY
GLUCOSE-CAPILLARY: 137 mg/dL — AB (ref 65–99)
GLUCOSE-CAPILLARY: 126 mg/dL — AB (ref 65–99)
GLUCOSE-CAPILLARY: 129 mg/dL — AB (ref 65–99)
Glucose-Capillary: 138 mg/dL — ABNORMAL HIGH (ref 65–99)
Glucose-Capillary: 137 mg/dL — ABNORMAL HIGH (ref 65–99)

## 2015-10-22 LAB — AEROBIC/ANAEROBIC CULTURE W GRAM STAIN (SURGICAL/DEEP WOUND): Culture: NO GROWTH

## 2015-10-22 LAB — AEROBIC/ANAEROBIC CULTURE (SURGICAL/DEEP WOUND)

## 2015-10-22 LAB — CULTURE, BODY FLUID W GRAM STAIN -BOTTLE: Culture: NO GROWTH

## 2015-10-22 LAB — CULTURE, BODY FLUID-BOTTLE

## 2015-10-22 LAB — CANCER ANTIGEN 19-9: CA 19 9: 535 U/mL — AB (ref 0–35)

## 2015-10-22 LAB — VITAMIN B12: Vitamin B-12: 717 pg/mL (ref 180–914)

## 2015-10-22 MED ORDER — SODIUM CHLORIDE 0.9% FLUSH
10.0000 mL | Freq: Two times a day (BID) | INTRAVENOUS | Status: DC
  Administered 2015-10-22: 10 mL
  Administered 2015-10-23: 20 mL
  Administered 2015-10-23 – 2015-10-26 (×7): 10 mL
  Administered 2015-10-27: 30 mL
  Administered 2015-10-27 – 2015-10-28 (×3): 10 mL

## 2015-10-22 MED ORDER — PHENOL 1.4 % MT LIQD
2.0000 | OROMUCOSAL | Status: DC | PRN
  Administered 2015-10-22: 2 via OROMUCOSAL
  Filled 2015-10-22: qty 177

## 2015-10-22 MED ORDER — ENOXAPARIN SODIUM 40 MG/0.4ML ~~LOC~~ SOLN
40.0000 mg | SUBCUTANEOUS | Status: DC
  Administered 2015-10-22 – 2015-10-28 (×7): 40 mg via SUBCUTANEOUS
  Filled 2015-10-22 (×8): qty 0.4

## 2015-10-22 MED ORDER — DIAZEPAM 2 MG PO TABS
2.0000 mg | ORAL_TABLET | Freq: Two times a day (BID) | ORAL | Status: DC
  Administered 2015-10-22 (×2): 2 mg via ORAL
  Filled 2015-10-22 (×2): qty 1

## 2015-10-22 MED ORDER — ASPIRIN 81 MG PO CHEW
81.0000 mg | CHEWABLE_TABLET | Freq: Every day | ORAL | Status: DC
  Administered 2015-10-22 – 2015-10-28 (×7): 81 mg via ORAL
  Filled 2015-10-22 (×7): qty 1

## 2015-10-22 MED ORDER — SODIUM CHLORIDE 0.9% FLUSH: 10.0000 mL | INTRAVENOUS | Status: DC | PRN

## 2015-10-22 MED ORDER — CHLORHEXIDINE GLUCONATE 0.12 % MT SOLN
15.0000 mL | Freq: Two times a day (BID) | OROMUCOSAL | Status: DC
  Administered 2015-10-22 – 2015-10-28 (×14): 15 mL via OROMUCOSAL
  Filled 2015-10-22 (×13): qty 15

## 2015-10-22 NOTE — Consult Note (Signed)
   [facility] CM Inpatient Consult   10/22/2015  [name] 1945/04/10 [id number]   Briefly spoke with [name] at bedside. Made her aware that [facility] will continue to follow. She reports she is not feeling well. She she returned to the bed after being up with nursing. Spoke with inpatient RNCM. Will continue to follow. Contact information left at bedside. [name] expresses appreciation of visit.    [name], MSN-Ed, RN,BSN [facility] Liaison [phone]

## 2015-10-22 NOTE — Progress Notes (Signed)
Patient c/o SOB, breathing tx given, tube feeding residuals checked and none aspirtated. TCTS called stat CXR ordered.

## 2015-10-22 NOTE — Progress Notes (Signed)
      [address]       [place] [address]             [phone]      5 Days Post-Op Procedure(s) (LRB): VIDEO ASSISTED THORACOSCOPY (VATS) with Drainage of Pleural Effusion (Right) DRAINAGE OF PLEURAL EFFUSION (Right)   Subjective:  [name] [name] wants NG tube removed.  She also wants chest tube out.  She states her belly pain is a bit better. She continues to not want to ambulate  Objective: Vital signs in last 24 hours: Temp:  [97.5 F (36.4 C)-97.9 F (36.6 C)] 97.6 F (36.4 C) (07/26 0743) Pulse Rate:  [93-109] 99 (07/26 0743) Cardiac Rhythm: Sinus tachycardia (07/26 0743) Resp:  [20-26] 21 (07/26 0743) BP: (98-116)/(55-69) 116/69 (07/26 0743) SpO2:  [92 %-97 %] 95 % (07/26 0743) Weight:  [149 lb 14.6 oz (68 kg)-151 lb 9.6 oz (68.8 kg)] 151 lb 9.6 oz (68.8 kg) (07/26 [id number])  Intake/Output from previous day: 07/25 0701 - 07/26 0700 In: 2039.5 [P.O.:360; I.V.:1106; NG/GT:573.5] Out: 1020 [Urine:900; Chest Tube:120] Intake/Output this shift: Total I/O In: 30 [NG/GT:30] Out: 0   General appearance: alert, cooperative and no distress Heart: regular rate and rhythm Lungs: diminished breath sounds bibasilar Abdomen: soft, +tenderness; bowel sounds normal; no masses,  no organomegaly Wound: clean and dry  Lab Results:  Recent Labs  10/20/15 0515 10/21/15 0430  WBC 10.6* 11.6*  HGB 8.5* 9.5*  HCT 27.2* 30.5*  PLT 222 249   BMET:  Recent Labs  10/20/15 0515 10/21/15 0436  NA 138 138  K 3.4* 3.9  CL 104 106  CO2 29 26  GLUCOSE 88 99  BUN 12 12  CREATININE 0.44 0.51  CALCIUM 8.1* 8.1*    PT/INR: No results for input(s): LABPROT, INR in the last 72 hours. ABG    Component Value Date/Time   PHART 7.473 (H) 10/05/2015 2223   HCO3 26.7 (H) 10/05/2015 2223   TCO2 24.2 10/05/2015 2223   O2SAT 92.2 10/05/2015 2223   CBG (last 3)   Recent Labs  10/21/15 2338 10/22/15 0335 10/22/15 0742  GLUCAP 127* 137* 126*     Assessment/Plan: S/P Procedure(s) (LRB): VIDEO ASSISTED THORACOSCOPY (VATS) with Drainage of Pleural Effusion (Right) DRAINAGE OF PLEURAL EFFUSION (Right)  1. Chest tube- 3-4+ air leak with cough, CXR remains stable... Leave on water seal... Cytology was negative for malignant cells 2. GI- NG tube placed yesterday, GI following.... Needs repeat PET as outpatient 3. DVT Prophylaxis- patient not ambulating much, maybe once a day.. Per nursing just wants to lay in bed... Will start Lovenox 4. Dispo- patient stable, continue chest tube to water seal continue current care    LOS: 9 days    Ahmed Prima, Junie Panning 10/22/2015

## 2015-10-22 NOTE — Progress Notes (Addendum)
Name: [name] [name] MRN: [id number] DOB: 06-Sep-1945    ADMISSION DATE:  10/25/2015  CHIEF COMPLAINT:  Dyspnea  BRIEF PATIENT DESCRIPTION: 70 year old female with R hydropneumothorax admitted from office for CVTS evaluation.  STUDIES:  09/16/15 TTE: Normal LV size with mild LV hypertrophy, EF 65-70%. Normal RV size and systolic function. No significant valvular abnormalities. 7/07 CT Chest: moderate R hydropneumothorax, abnormal pleural thickening with scattered areas of pleural fluid in the R hemithorax, additional subpleural reticulation / fibrosis with interlobular septal thickening R>L, fluid along the pancreatic body/tail related to known acute pancreatitis 7/03 Cytology from [facility]: acute inflammation, no malignant cells 7/07 Cytology from [facility]: no malignant cells    MICROBIOLOGY:  Urine Ctx 7/2:  Recommended recollection Urine Ctx 7/3:  Recommended recollection R Pleural Fluid 7/3:  Negative R Pleural Fluid 7/6:  Negative MRSA PCR 7/20:  Negative R Pleural Fluid 7/21> neg gm stain>> R Parietal Pleural Biopsy 7/21> neg   ANTIBIOTICS: Cefuroxime 7/20 - 7/21 (surgical prophylaxis)   SUBJECTIVE:   Abd pain better Breathing better Wants tubes out Still with air leak on CT    VITAL SIGNS:   Temp:  [97.5 F (36.4 C)-98 F (36.7 C)] 98 F (36.7 C) (07/26 1105) Pulse Rate:  [93-109] 102 (07/26 1105) Resp:  [20-26] 22 (07/26 1105) BP: (98-124)/(55-69) 121/67 (07/26 1105) SpO2:  [92 %-96 %] 96 % (07/26 1105) Weight:  [68 kg (149 lb 14.6 oz)-68.8 kg (151 lb 9.6 oz)] 68.8 kg (151 lb 9.6 oz) (07/26 [id number])   Physical Exam: General: Elderly female in NAD. Comfortable. Not in distress.  HEENT: Moist mucus membranes. No scleral injection or icterus. Pupils symmetric.  Cardiac:good s1/s2. Regular rhythm. (-) s3/m/r/g Pulmonary: Clear bilaterally to auscultation.  Right chest tube in place > on water seal.  Abdomen:  Soft. NT/ (-) masses.  Neurological:  CN in tact.  Grossly nonfocal. Integument:  Warm & dry. No rash on exposed skin.   Recent Labs Lab 10/19/15 0515 10/20/15 0515 10/21/15 0436  NA 139 138 138  K 3.5 3.4* 3.9  CL 105 104 106  CO2 30 29 26   BUN 17 12 12   CREATININE 0.48 0.44 0.51  GLUCOSE 96 88 99    Recent Labs Lab 10/19/15 0515 10/20/15 0515 10/21/15 0430  HGB 9.9* 8.5* 9.5*  HCT 32.1* 27.2* 30.5*  WBC 13.2* 10.6* 11.6*  PLT 263 222 249   Dg Abd 1 View  Result Date: 10/21/2015 CLINICAL DATA:  70 year old female -feeding tube placement. EXAM: ABDOMEN - 1 VIEW COMPARISON:  10/18/2015 FINDINGS: A small bore feeding tube is identified with tip overlying the proximal jejunum. No dilated bowel loops are identified. IMPRESSION: Small bore feeding tube with tip overlying the proximal jejunum. Electronically Signed   By: Margarette Canada M.D.   On: 10/21/2015 16:41  Dg Chest Port 1 View  Result Date: 10/22/2015 CLINICAL DATA:  Chest tube. EXAM: PORTABLE CHEST 1 VIEW COMPARISON:  10/21/2015. FINDINGS: Interim placement of feeding tube. Its tip is below the hemidiaphragm. Right IJ line right and right chest tube in stable position. Small right basilar pneumothorax again noted. Low lung volumes with bibasilar atelectasis and/or infiltrate. Small bilateral pleural effusions again noted. Stable cardiomegaly. IMPRESSION: 1. Interim placement of feeding tube, its tip is below left hemidiaphragm. Right IJ line right chest tube in stable position. 2.  Stable small right basilar pneumothorax. 3. Persistent bibasilar atelectasis and/or infiltrates. Small bilateral pleural effusions. Electronically Signed   By: Marcello Moores  Register   On: 10/22/2015 07:31  Dg Chest Port 1 View  Result Date: 10/21/2015 CLINICAL DATA:  Chest tube. EXAM: PORTABLE CHEST 1 VIEW COMPARISON:  10/20/2015. FINDINGS: Right IJ line and right chest tube in stable position. Small right basilar pneumothorax remains unchanged. Mild bibasilar atelectasis. Small left pleural effusion  unchanged. Heart size normal. No acute bony abnormality. IMPRESSION: 1. Right chest tube in stable position. Small right basilar pneumothorax again noted. No interim change. Right IJ line in stable position. 2. Mild bibasilar atelectasis with stable small left pleural effusion again noted. Electronically Signed   By: Marcello Moores  Register   On: 10/21/2015 07:47  ASSESSMENT / PLAN:  70 year old female with recurrent right exudative pleural effusion and acute on chronic pancreatitis with pseudocyst formation. Patient underwent VATS 7/21 c/w empyema with culture and parietal pleural biopsy. Successfully extubated post-operative and transferred to the stepdown unit for further care.   Recurrent Right Exudative Pleural Effusion c/w empyema    S/P VATS 7/21 .  - Following culture and pathology from VATS > no official patholy report seen in EPIC - CT on water seal. 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   Asthma vs COPD:  No signs of exacerbation.   - Continuing Breo in place of home Dulera.  - Continuing Xopenex nebs q8hr scheduled and prn.  Acute on Chronic Pancreatitis w/ Pseudocyst per Radiology and Peripancreatic Fluid/Inflammation per GI.  - GI following > eventual MRI/MRCP vs EUS. Not sure if we can get MRI as inpt. Will need to ask GI.  - tolerating TF  Possible lytic lesion in R acetabulum not seen in previous ct scan or PET scan.  - Plan for rpt PET scan as an outpt.  - I had mentioned to the pt's daughter (whom I knew prior to pt's admission since I was taking care of the pt as an outpt before) regarding the lesion in the R acetabulum and concern for pancreatic cystic CA.  We decided to hold off on telling the pt for now. Let pt get better and discuss with her as an outpt.  She will need work up as outpt. - Dr. Corrie Dandy  Coronary Artery Disease and  HTN:   - Continuing ASA 81mg   daily - Clonidine   Tobacco abuse disorder - Cessation counseling prior to discharge  Georgann Housekeeper, AGACNP-BC [facility] Pager [phone] or [phone]  10/22/2015 12:03 PM   ATTENDING NOTE / ATTESTATION NOTE :   I have discussed the case with the resident/APP  Georgann Housekeeper.   I agree with the resident/APP's  history, physical examination, assessment, and plans.    I have edited the above note and modified it according to our agreed history, physical examination, assessment and plan.   Family : Discussed the plan with pt's daughter Ginger isley over the phone. Daughter will discuss the "possible CA" with siblings.   Monica Becton, MD 10/22/2015, 12:46 PM [facility] Pager [phone] After 3 pm or if no answer, call [phone]

## 2015-10-22 NOTE — Plan of Care (Signed)
Problem: Activity: Goal: Risk for activity intolerance will decrease Outcome: Progressing Patient unmotivated to participate in activity, needs a lot of motivation. Needs encouragement to ambulate in halls. Able to ambulate with a walker requiring one person assistance. Adequately uses lung pillow for splinting.  Problem: Respiratory: Goal: Respiratory status will improve Outcome: Progressing Patient initially requiring supplemental oxygen to maintain saturations. Patient has been weaned off oxygen and has been on room air for 48 hours. Sats are maintaining 92-97 with ambulation. When at rest, pt breating shallow, needs encouragement to take deep breaths and work on Chiropodist.   Problem: Skin Integrity: Goal: Wound healing without signs and symptoms infection will improve Outcome: Progressing Incision healing well, no redness or oozing noted, tender to touch. Dressing care provided to chest tube site.

## 2015-10-22 NOTE — Progress Notes (Signed)
Daily Rounding Note  10/22/2015, 10:23 AM  LOS: 9 days   SUBJECTIVE:   Chief complaint: NGT irritates back of throat.  LUQ pain and chest tube pain continue.  No nausea, tolerating tube feeds currently at 60 ml per hour (goal rate!!).  Smear of brown stool this AM, no  Issues with diarrhea.   RN says pt c/o right hip pain with walking today (first time pt has mentioned it).   OBJECTIVE:         Vital signs in last 24 hours:    Temp:  [97.5 F (36.4 C)-97.9 F (36.6 C)] 97.6 F (36.4 C) (07/26 0743) Pulse Rate:  [93-109] 99 (07/26 0743) Resp:  [20-26] 21 (07/26 0743) BP: (98-124)/(55-69) 124/68 (07/26 0925) SpO2:  [92 %-96 %] 95 % (07/26 0743) Weight:  [68 kg (149 lb 14.6 oz)-68.8 kg (151 lb 9.6 oz)] 68.8 kg (151 lb 9.6 oz) (07/26 0632) Last BM Date: 10/21/15 Filed Weights   10/20/15 0251 10/21/15 1441 10/22/15 [id number]  Weight: 66 kg (145 lb 8.1 oz) 68 kg (149 lb 14.6 oz) 68.8 kg (151 lb 9.6 oz)   General: remains depressed and near tearful.   Heart: RRR Chest: diminished BS bil.  No cough Abdomen: soft, hypoactive, tender on LUQ.    Extremities: no CCE Neuro/Psych:  Oriented x3, anxious/fretful/depressed.   Intake/Output from previous day: 07/25 0701 - 07/26 0700 In: 2039.5 [P.O.:360; I.V.:1106; NG/GT:573.5] Out: 1020 [Urine:900; Chest Tube:120]  Intake/Output this shift: Total I/O In: 293 [P.O.:90; I.V.:53; NG/GT:150] Out: 100 [Urine:100]  Lab Results:  Recent Labs  10/20/15 0515 10/21/15 0430  WBC 10.6* 11.6*  HGB 8.5* 9.5*  HCT 27.2* 30.5*  PLT 222 249   BMET  Recent Labs  10/20/15 0515 10/21/15 0436  NA 138 138  K 3.4* 3.9  CL 104 106  CO2 29 26  GLUCOSE 88 99  BUN 12 12  CREATININE 0.44 0.51  CALCIUM 8.1* 8.1*   LFT  Recent Labs  10/20/15 0515 10/21/15 0436  ALBUMIN 1.9* 1.9*   PT/INR No results for input(s): LABPROT, INR in the last 72 hours. Hepatitis Panel No results for  input(s): HEPBSAG, HCVAB, HEPAIGM, HEPBIGM in the last 72 hours.  Studies/Results: Dg Abd 1 View  Result Date: 10/21/2015 CLINICAL DATA:  70 year old female -feeding tube placement. EXAM: ABDOMEN - 1 VIEW COMPARISON:  10/18/2015 FINDINGS: A small bore feeding tube is identified with tip overlying the proximal jejunum. No dilated bowel loops are identified. IMPRESSION: Small bore feeding tube with tip overlying the proximal jejunum. Electronically Signed   By: Margarette Canada M.D.   On: 10/21/2015 16:41  Dg Chest Port 1 View  Result Date: 10/22/2015 CLINICAL DATA:  Chest tube. EXAM: PORTABLE CHEST 1 VIEW COMPARISON:  10/21/2015. FINDINGS: Interim placement of feeding tube. Its tip is below the hemidiaphragm. Right IJ line right and right chest tube in stable position. Small right basilar pneumothorax again noted. Low lung volumes with bibasilar atelectasis and/or infiltrate. Small bilateral pleural effusions again noted. Stable cardiomegaly. IMPRESSION: 1. Interim placement of feeding tube, its tip is below left hemidiaphragm. Right IJ line right chest tube in stable position. 2.  Stable small right basilar pneumothorax. 3. Persistent bibasilar atelectasis and/or infiltrates. Small bilateral pleural effusions. Electronically Signed   By: Marcello Moores  Register   On: 10/22/2015 07:31  Dg Chest Port 1 View  Result Date: 10/21/2015 CLINICAL DATA:  Chest tube. EXAM: PORTABLE CHEST 1 VIEW COMPARISON:  10/20/2015.  FINDINGS: Right IJ line and right chest tube in stable position. Small right basilar pneumothorax remains unchanged. Mild bibasilar atelectasis. Small left pleural effusion unchanged. Heart size normal. No acute bony abnormality. IMPRESSION: 1. Right chest tube in stable position. Small right basilar pneumothorax again noted. No interim change. Right IJ line in stable position. 2. Mild bibasilar atelectasis with stable small left pleural effusion again noted. Electronically Signed   By: Marcello Moores  Register   On:  10/21/2015 07:47   ASSESMENT:   * Pancreatitis of unclear etiology.  7/3 and7/24 CT scans (c/w 09/23/15 CT #1):increased size and # of peripancreatic fluid collections unable to rule out cystic neoplasm. CA-19-9 is 535, normal CEA.  Raises question of cystic neoplasm.   Initial clinical presentation was 08/2015. Previous cholecystectomy 2011. Left abdominal pain improved but persists along with nausea and anorexia.  Likely needs another PET scan as outpt.  * Hypoalbuminemia. Protein cal malnutrition, prealbumin 16.2. tolerating tube feeds, dislikes feeding tube but willing to keep in place for now.   * 10/09/2015 VATS assisted right empyema drainage, WBCs but no organisms on stain. Chest tube in place. 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 Cytology 7/3 and 7/6, negative for cancer.   * Lytic lesion right hip. 07/07/15 PET-CT: hypermetabolic activity in right lung (subsequently proven not to have cancer), left ribs, nothing in hips. CA-19-9 is 535, normal CEA.  Raises question of underlying pancreatic malignancy.  Likely needs another PET scan as outpt. C/o pain in that hip while walking today.   * Depression, anxiety. This along with pain causing subpar participation in PT.  2013 - 2014 PMD notes mention "doing better" on Prozac. Seroquel caused palpitations.  Trazodone worsened insomnia.  Inderal for tremor/palpitations worsened depression.   * Anemia, macrocytic. Hx hemochromatosis treated with Phlebotomy (last on 06/16/2015), followed by Dr [name] . Folate processing, B12 normal.     * Chronic diarrhea, suspected post cholecystectomy related. ? IBS-D. currently not having issues with diarrhea.  Colonoscopy 06/2014: benign cecal polyp, normal random biopsies, non-bleeding cecal AVM.   * Wegener's granulomatosis, polyangiitis. P-ANCA elevated at 1:1.6 on 07/23/15, normal at <1:20 on 10/03/15. Receiving weekly Rituxan infusions through  rheumatologist, Dr [name] for a few weeks PTA.      PLAN   *  Chloraseptic spray to bedside. Change to scheduled BID Valium.  Pt has had bad reactions to anti-depressants in past.  Lipase in AM.    Please note that the suspicion of pancreatic cancer and the lytic lesion in hip have not been mentioned to pt or family yet.  With her current anxiety and depression, we ought to wait.  Nothing will be done in regards to investigation or treatment in the near to mid term.  Best to wait until she is feeling better emotionally before we drop this info on her.     Azucena Freed  10/22/2015, 10:23 AM Pager: [phone]

## 2015-10-22 NOTE — Progress Notes (Signed)
CCM called. Dr. Halford Chessman returned call. Discussed patient, no new orders received. Will cont to monitor patient.

## 2015-10-23 ENCOUNTER — Inpatient Hospital Stay (HOSPITAL_COMMUNITY): Payer: Medicare Other

## 2015-10-23 LAB — FOLATE RBC
FOLATE, RBC: 1830 ng/mL (ref >498)
Folate, Hemolysate: 503.3 ng/mL
HEMATOCRIT: 27.5 % — AB (ref 34.0–46.6)

## 2015-10-23 LAB — GLUCOSE, CAPILLARY
GLUCOSE-CAPILLARY: 159 mg/dL — AB (ref 65–99)
GLUCOSE-CAPILLARY: 145 mg/dL — AB (ref 65–99)
GLUCOSE-CAPILLARY: 132 mg/dL — AB (ref 65–99)
GLUCOSE-CAPILLARY: 127 mg/dL — AB (ref 65–99)
GLUCOSE-CAPILLARY: 137 mg/dL — AB (ref 65–99)
Glucose-Capillary: 145 mg/dL — ABNORMAL HIGH (ref 65–99)
Glucose-Capillary: 151 mg/dL — ABNORMAL HIGH (ref 65–99)

## 2015-10-23 LAB — LIPASE, BLOOD: Lipase: 99 U/L — ABNORMAL HIGH (ref 11–51)

## 2015-10-23 MED ORDER — WHITE PETROLATUM GEL
Status: AC
  Administered 2015-10-23: 1
  Filled 2015-10-23: qty 1

## 2015-10-23 MED ORDER — DIAZEPAM 2 MG PO TABS
2.0000 mg | ORAL_TABLET | Freq: Four times a day (QID) | ORAL | Status: DC | PRN
  Administered 2015-10-23 – 2015-10-24 (×4): 2 mg via ORAL
  Filled 2015-10-23 (×3): qty 1

## 2015-10-23 MED ORDER — LEVALBUTEROL HCL 0.63 MG/3ML IN NEBU
0.6300 mg | INHALATION_SOLUTION | Freq: Four times a day (QID) | RESPIRATORY_TRACT | Status: DC
  Administered 2015-10-23 – 2015-11-03 (×41): 0.63 mg via RESPIRATORY_TRACT
  Filled 2015-10-23 (×45): qty 3

## 2015-10-23 NOTE — Progress Notes (Signed)
Daily Rounding Note  10/23/2015, 9:15 AM  LOS: 10 days   SUBJECTIVE:   Chief complaint:  Abdominal pain.   Tolerating tube feedings at goal rate. Asking when FT can be removed and negotiating as to timing of this.  Says the sensation in the back of her throat persists, despite chloraseptic spray.  Pain persists but she grudgingly admits to improved pain, still located across upper abdomen, worse on right.  Her biggest complaint now is "rough night"  Worse than usual insomnia.   Does not recall having BM yesterday, loose stool once this AM.  No nausea.     Note CVTS increased Valium to 2 mg q 6 hours.   OBJECTIVE:         Vital signs in last 24 hours:    Temp:  [97.9 F (36.6 C)-98.9 F (37.2 C)] 98.1 F (36.7 C) (07/27 0811) Pulse Rate:  [102-119] 111 (07/27 0311) Resp:  [22-32] 30 (07/27 0311) BP: (111-126)/(46-68) 115/60 (07/27 0311) SpO2:  [90 %-98 %] 98 % (07/27 0841) Weight:  [72.5 kg (159 lb 13.3 oz)] 72.5 kg (159 lb 13.3 oz) (07/27 0641) Last BM Date: 10/21/15 Filed Weights   10/21/15 1441 10/22/15 [id number] 10/23/15 0641  Weight: 68 kg (149 lb 14.6 oz) 68.8 kg (151 lb 9.6 oz) 72.5 kg (159 lb 13.3 oz)   General: looks a bit better.  Fully alert.  Not as uncomfortable appearing.    Heart: RRR Chest: clear bil.  No labored resps or cough.  Chest tube on right thorax.  Abdomen: soft, BS present, moderate RUQ tenderness: definitely improved exam.    Extremities: no CCE. Neuro/Psych:  Less fretful and anxious, still quite depressed.   Intake/Output from previous day: 07/26 0701 - 07/27 0700 In: 2253 [P.O.:180; I.V.:1203; NG/GT:870] Out: 746 [Urine:675; Stool:1; Chest Tube:70]  Intake/Output this shift: Total I/O In: -  Out: 100 [Chest Tube:100]  Lab Results:  Recent Labs  10/21/15 0430  WBC 11.6*  HGB 9.5*  HCT 30.5*  PLT 249   BMET  Recent Labs  10/21/15 0436  NA 138  K 3.9  CL 106  CO2 26    GLUCOSE 99  BUN 12  CREATININE 0.51  CALCIUM 8.1*   LFT  Recent Labs  10/21/15 0436  ALBUMIN 1.9*   PT/INR No results for input(s): LABPROT, INR in the last 72 hours. Hepatitis Panel No results for input(s): HEPBSAG, HCVAB, HEPAIGM, HEPBIGM in the last 72 hours.  Studies/Results: Dg Abd 1 View  Result Date: 10/21/2015 CLINICAL DATA:  70 year old female -feeding tube placement. EXAM: ABDOMEN - 1 VIEW COMPARISON:  10/18/2015 FINDINGS: A small bore feeding tube is identified with tip overlying the proximal jejunum. No dilated bowel loops are identified. IMPRESSION: Small bore feeding tube with tip overlying the proximal jejunum. Electronically Signed   By: Margarette Canada M.D.   On: 10/21/2015 16:41  Dg Chest Port 1 View  Result Date: 10/23/2015 CLINICAL DATA:  Pneumothorax.  Chest tube . EXAM: PORTABLE CHEST 1 VIEW COMPARISON:  10/22/2015 . FINDINGS: Right IJ line and feeding tube in stable position. Right chest tube in stable position. Stable tiny right base pneumothorax. Persistent mild right lung interstitial prominence and left base subsegmental atelectasis. Persistent small bilateral pleural effusions . Heart size stable. IMPRESSION: 1. Lines and tubes including right chest tube in stable position. Persistent small right basilar pneumothorax. 2. Persistent interstitial prominence right lung. Persistent subsegmental atelectasis left base. Persistent small bilateral  pleural effusions. Chest is unchanged prior exam. Electronically Signed   By: Marcello Moores  Register   On: 10/23/2015 07:50  Dg Chest Port 1 View  Result Date: 10/22/2015 CLINICAL DATA:  Shortness of breath EXAM: PORTABLE CHEST 1 VIEW COMPARISON:  10/22/2015 FINDINGS: Feeding tube coursing below the diaphragm. Right IJ central venous catheter projecting over the cavoatrial junction. Right-sided chest tube in unchanged position. Persistent small right basilar pneumothorax. Small right pleural effusion. Interstitial thickening of the  right lung. No focal consolidation. Small left pleural effusion. No left pneumothorax. Stable cardiomediastinal silhouette. IMPRESSION: 1. Right-sided chest tube with persistent small right hydropneumothorax. 2. Small left pleural. Electronically Signed   By: Kathreen Devoid   On: 10/22/2015 19:27  Dg Chest Port 1 View  Result Date: 10/22/2015 CLINICAL DATA:  Chest tube. EXAM: PORTABLE CHEST 1 VIEW COMPARISON:  10/21/2015. FINDINGS: Interim placement of feeding tube. Its tip is below the hemidiaphragm. Right IJ line right and right chest tube in stable position. Small right basilar pneumothorax again noted. Low lung volumes with bibasilar atelectasis and/or infiltrate. Small bilateral pleural effusions again noted. Stable cardiomegaly. IMPRESSION: 1. Interim placement of feeding tube, its tip is below left hemidiaphragm. Right IJ line right chest tube in stable position. 2.  Stable small right basilar pneumothorax. 3. Persistent bibasilar atelectasis and/or infiltrates. Small bilateral pleural effusions. Electronically Signed   By: Marcello Moores  Register   On: 10/22/2015 07:31  Scheduled Meds: . aspirin  81 mg Oral Daily  . bisacodyl  10 mg Oral Daily  . chlorhexidine  15 mL Mouth Rinse BID  . cloNIDine  0.1 mg Oral TID  . enoxaparin (LOVENOX) injection  40 mg Subcutaneous Q24H  . famotidine  20 mg Oral Daily  . feeding supplement (ENSURE ENLIVE)  237 mL Oral BID BM  . fluticasone furoate-vilanterol  1 puff Inhalation Daily  . levalbuterol  0.63 mg Nebulization Q6H  . multivitamin with minerals  1 tablet Oral Daily  . simethicone  80 mg Oral QID  . sodium chloride flush  10-40 mL Intracatheter Q12H  . sodium chloride flush  3 mL Intravenous Q12H  . sodium chloride flush  3 mL Intravenous Q12H   Continuous Infusions: . dextrose 5 % and 0.9% NaCl 50 mL/hr at 10/23/15 0700  . feeding supplement (VITAL AF 1.2 CAL) 1,000 mL (10/22/15 1124)   PRN Meds:.acetaminophen **OR** acetaminophen, alum & mag  hydroxide-simeth, diazepam, fentaNYL (SUBLIMAZE) injection, ondansetron **OR** ondansetron (ZOFRAN) IV, phenol, polyethylene glycol, potassium chloride, sodium chloride flush, traMADol   ASSESMENT:   * Pancreatitis of unclear etiology.  ? Due to Wegener's?  7/3 and7/24 CT scans (c/w 09/23/15 CT #1):increased size and # of peripancreatic fluid collections unable to rule out cystic neoplasm. CA-19-9 is 535, normal CEA.  Raises question of cystic neoplasm.  Initial clinical presentation was 08/2015.  Previous cholecystectomy 2011. Likely needs another PET scan as outpt. Today is first day she has looked better and admits that pain is improved   * Hypoalbuminemia. Protein cal malnutrition, prealbumin 16.2.tolerating tube feeds, dislikes feeding tube but willing to keep in place for now.   *  Insomnia.  Will defer mgt of this to CVTS vs CCM, however better sleep hygiene would ceraitnly help her mood and pain perseption.   * 10/21/2015 VATS assisted right empyema drainage, WBCs but no organisms on stain. Chest tube in place. 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 Cytology 7/3 and 7/6, negative for cancer.   * Lytic lesion  right hip. 07/07/15 PET-CT: hypermetabolic activity in right lung (subsequently proven not to have cancer), left ribs, nothing in hips. CA-19-9 is 535, normal CEA.  Raises question of underlying pancreatic malignancy.  Likely needs another PET scan as outpt. C/o pain in that hip while walking today.   * Depression, anxiety. This along with pain causing subpar participation in PT.  2013 - 2014 PMD notes mention "doing better" on Prozac. Seroquel caused palpitations.  Trazodone worsened insomnia.  Inderal for tremor/palpitations worsened depression.   * Anemia, macrocytic. Hgb improved. Hx hemochromatosis treated with Phlebotomy (last on 06/16/2015), followed by Dr [name] . Folate processing, B12 normal.     * Hx chronic diarrhea,  suspected post cholecystectomy related. ?IBS-D.  Receiving daily Dulcolax andcurrently not having issues with diarrhea.  Colonoscopy 06/2014: benign cecal polyp, normal random biopsies, non-bleeding cecal AVM.   * Wegener's granulomatosis, polyangiitis. P-ANCA elevated at 1:1.6 on 07/23/15, normal at <1:20 on 10/03/15. Receiving weekly Rituxan infusions through rheumatologist, Dr [name] for a few weeks PTA.  Initial clinical presentation was 08/2015.   * Hypoalbuminemia. Protein cal malnutrition, prealbumin 16.2.tolerating tube feeds, dislikes feeding tube but willing to keep in place for now.   * 09/30/2015 VATS assisted right empyema drainage, WBCs but no organisms on stain. Chest tube in place. 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 Cytology 7/3 and 7/6, negative for cancer.   * Lytic lesion right hip. 07/07/15 PET-CT: hypermetabolic activity in right lung (subsequently proven not to have cancer), left ribs, nothing in hips. CA-19-9 is 535, normal CEA.  Raises question of underlying pancreatic malignancy.  Likely needs another PET scan as outpt. C/o pain in that hip while walking today.   * Depression, anxiety. This along with pain causing subpar participation in PT.  2013 - 2014 PMD notes mention "doing better" on Prozac. Seroquel caused palpitations.  Trazodone worsened insomnia.  Inderal for tremor/palpitations worsened depression.   * Anemia, macrocytic. Hx hemochromatosis treated with Phlebotomy (last on 06/16/2015), followed by Dr [name] . Folate processing, B12 normal.     * Chronic diarrhea, suspected post cholecystectomy related. ?IBS-D. currently not having issues with diarrhea.  Colonoscopy 06/2014: benign cecal polyp, normal random biopsies, non-bleeding cecal AVM.   * Wegener's granulomatosis, polyangiitis. P-ANCA elevated at 1:1.6 on 07/23/15, normal at <1:20 on 10/03/15. Receiving weekly Rituxan infusions through  rheumatologist, Dr [name] for a few weeks PTA.     PLAN   *  Note that CCM is wondering if the MRI/MRCP vs EUS ought to be pursued as inpt. Will d/w Dr Hilarie Fredrickson but both probably best done after pancreatitis has a few weeks to settle down.  If pancreatitis clinically worsens could consider MRI/MRCP sooner.    *  Should she be getting ongoing Rituxan?   Will defer mgt of insomnia to Surgery/CCM    Azucena Freed  10/23/2015, 9:15 AM Pager: [phone]

## 2015-10-23 NOTE — Care Management Important Message (Signed)
Important Message  Patient Details  Name: [name] [name] MRN: [id number] Date of Birth: [birth year]   Medicare Important Message Given:  Yes    [name] May 10/23/2015, 12:10 PM

## 2015-10-23 NOTE — Progress Notes (Signed)
Name: [name] [name] MRN: [id number] DOB: 03-May-1945    ADMISSION DATE:  10/14/2015  CHIEF COMPLAINT:  Dyspnea  BRIEF PATIENT DESCRIPTION: 70 year old female with R hydropneumothorax admitted from office for CVTS evaluation.  STUDIES:  09/16/15 TTE: Normal LV size with mild LV hypertrophy, EF 65-70%. Normal RV size and systolic function. No significant valvular abnormalities. 7/07 CT Chest: moderate R hydropneumothorax, abnormal pleural thickening with scattered areas of pleural fluid in the R hemithorax, additional subpleural reticulation / fibrosis with interlobular septal thickening R>L, fluid along the pancreatic body/tail related to known acute pancreatitis 7/03 Cytology from [facility]: acute inflammation, no malignant cells 7/07 Cytology from [facility]: no malignant cells    MICROBIOLOGY:  Urine Ctx 7/2:  Recommended recollection Urine Ctx 7/3:  Recommended recollection R Pleural Fluid 7/3:  Negative R Pleural Fluid 7/6:  Negative MRSA PCR 7/20:  Negative R Pleural Fluid 7/21> neg gm stain>> R Parietal Pleural Biopsy 7/21> neg   ANTIBIOTICS: Cefuroxime 7/20 - 7/21 (surgical prophylaxis)   SUBJECTIVE:   Abd pain better. Tolerating TF.  More comfortable today.  Wants tubes out Still with air leak on CT Very anxious and depressed. Hard to sleep at hs.     VITAL SIGNS:   Temp:  [97.9 F (36.6 C)-98.9 F (37.2 C)] 98.8 F (37.1 C) (07/27 1110) Pulse Rate:  [109-119] 109 (07/27 1110) Resp:  [24-32] 24 (07/27 1110) BP: (106-126)/(46-62) 106/62 (07/27 1110) SpO2:  [90 %-98 %] 94 % (07/27 1110) Weight:  [72.5 kg (159 lb 13.3 oz)] 72.5 kg (159 lb 13.3 oz) (07/27 [id number])   Physical Exam: General: Elderly female in NAD. Comfortable. Not in distress.  HEENT: Moist mucus membranes. No scleral injection or icterus. Pupils symmetric.  Cardiac:good s1/s2. Regular rhythm. (-) s3/m/r/g Pulmonary: Clear bilaterally to auscultation.  Right chest tube in place > on water seal.    Abdomen:  Soft. NT/ (-) masses.  Neurological:  CN in tact. Grossly nonfocal. Integument:  Warm & dry. No rash on exposed skin.   Recent Labs Lab 10/19/15 0515 10/20/15 0515 10/21/15 0436  NA 139 138 138  K 3.5 3.4* 3.9  CL 105 104 106  CO2 30 29 26   BUN 17 12 12   CREATININE 0.48 0.44 0.51  GLUCOSE 96 88 99    Recent Labs Lab 10/19/15 0515 10/20/15 0515 10/21/15 0430  HGB 9.9* 8.5* 9.5*  HCT 32.1* 27.2* 30.5*  WBC 13.2* 10.6* 11.6*  PLT 263 222 249   Dg Abd 1 View  Result Date: 10/21/2015 CLINICAL DATA:  70 year old female -feeding tube placement. EXAM: ABDOMEN - 1 VIEW COMPARISON:  10/18/2015 FINDINGS: A small bore feeding tube is identified with tip overlying the proximal jejunum. No dilated bowel loops are identified. IMPRESSION: Small bore feeding tube with tip overlying the proximal jejunum. Electronically Signed   By: Margarette Canada M.D.   On: 10/21/2015 16:41  Dg Chest Port 1 View  Result Date: 10/23/2015 CLINICAL DATA:  Pneumothorax.  Chest tube . EXAM: PORTABLE CHEST 1 VIEW COMPARISON:  10/22/2015 . FINDINGS: Right IJ line and feeding tube in stable position. Right chest tube in stable position. Stable tiny right base pneumothorax. Persistent mild right lung interstitial prominence and left base subsegmental atelectasis. Persistent small bilateral pleural effusions . Heart size stable. IMPRESSION: 1. Lines and tubes including right chest tube in stable position. Persistent small right basilar pneumothorax. 2. Persistent interstitial prominence right lung. Persistent subsegmental atelectasis left base. Persistent small bilateral pleural effusions. Chest is  unchanged prior exam. Electronically Signed   By: Marcello Moores  Register   On: 10/23/2015 07:50  Dg Chest Port 1 View  Result Date: 10/22/2015 CLINICAL DATA:  Shortness of breath EXAM: PORTABLE CHEST 1 VIEW COMPARISON:  10/22/2015 FINDINGS: Feeding tube coursing below the diaphragm. Right IJ central venous catheter  projecting over the cavoatrial junction. Right-sided chest tube in unchanged position. Persistent small right basilar pneumothorax. Small right pleural effusion. Interstitial thickening of the right lung. No focal consolidation. Small left pleural effusion. No left pneumothorax. Stable cardiomediastinal silhouette. IMPRESSION: 1. Right-sided chest tube with persistent small right hydropneumothorax. 2. Small left pleural. Electronically Signed   By: Kathreen Devoid   On: 10/22/2015 19:27  Dg Chest Port 1 View  Result Date: 10/22/2015 CLINICAL DATA:  Chest tube. EXAM: PORTABLE CHEST 1 VIEW COMPARISON:  10/21/2015. FINDINGS: Interim placement of feeding tube. Its tip is below the hemidiaphragm. Right IJ line right and right chest tube in stable position. Small right basilar pneumothorax again noted. Low lung volumes with bibasilar atelectasis and/or infiltrate. Small bilateral pleural effusions again noted. Stable cardiomegaly. IMPRESSION: 1. Interim placement of feeding tube, its tip is below left hemidiaphragm. Right IJ line right chest tube in stable position. 2.  Stable small right basilar pneumothorax. 3. Persistent bibasilar atelectasis and/or infiltrates. Small bilateral pleural effusions. 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 Patient underwent VATS 7/21 c/w empyema with culture and parietal pleural biopsy. Successfully extubated post-operative and transferred to the stepdown unit for further care.   Recurrent Right Exudative Pleural Effusion c/w empyema    S/P VATS 7/21 .  - Following culture and pathology from VATS > no official patholy report seen in EPIC. - I spoke with pathologist today > RLL specimen being worked up with stains; he thinks it might be carcinoma but   nothing definite yet - CT on water seal. Still with air  leak. 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   Asthma vs COPD:  No signs of exacerbation.   - Continuing Breo in place of home Dulera.  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 Concern for cystic neoplasm (Pancreas) - GI following > eventual MRI/MRCP vs EUS as an oupt per GI. - tolerating TF  Possible lytic lesion in R acetabulum not seen in previous ct scan or PET scan.  - Plan for rpt PET scan as an outpt.   Coronary Artery Disease and  HTN:   - Continuing ASA 81mg  daily - Clonidine   Tobacco abuse disorder - Cessation counseling prior to discharge    Family : Discussed the plan with pt's daughter [name] [name] over the phone on 7/26. Daughter will discuss the "possible CA" with siblings.   Monica Becton, MD 10/23/2015, 11:47 AM [facility] Pager [phone] After 3 pm or if no answer, call [phone]

## 2015-10-23 NOTE — Progress Notes (Addendum)
      [address]       [place] [address]             [phone]      6 Days Post-Op Procedure(s) (LRB): VIDEO ASSISTED THORACOSCOPY (VATS) with Drainage of Pleural Effusion (Right) DRAINAGE OF PLEURAL EFFUSION (Right)   Subjective:  Complains of feeling poorly this morning.  She is short of breath and complains of abdominal pain.  She is also depressed about current situation.  Objective: Vital signs in last 24 hours: Temp:  [97.9 F (36.6 C)-98.9 F (37.2 C)] 98.1 F (36.7 C) (07/27 0811) Pulse Rate:  [102-119] 111 (07/27 0311) Cardiac Rhythm: Sinus tachycardia (07/27 0813) Resp:  [22-32] 30 (07/27 0311) BP: (111-126)/(46-68) 115/60 (07/27 0311) SpO2:  [90 %-96 %] 96 % (07/27 0311) Weight:  [159 lb 13.3 oz (72.5 kg)] 159 lb 13.3 oz (72.5 kg) (07/27 0641)  Intake/Output from previous day: 07/26 0701 - 07/27 0700 In: 2253 [P.O.:180; I.V.:1203; NG/GT:870] Out: 746 [Urine:675; Stool:1; Chest Tube:70] Intake/Output this shift: Total I/O In: -  Out: 100 [Chest Tube:100]  General appearance: alert, cooperative and no distress Heart: regular rate and rhythm Lungs: clear to auscultation bilaterally Abdomen: soft, + tender to palpation, + BS Wound: clean and dry  Lab Results:  Recent Labs  10/21/15 0430  WBC 11.6*  HGB 9.5*  HCT 30.5*  PLT 249   BMET:  Recent Labs  10/21/15 0436  NA 138  K 3.9  CL 106  CO2 26  GLUCOSE 99  BUN 12  CREATININE 0.51  CALCIUM 8.1*    PT/INR: No results for input(s): LABPROT, INR in the last 72 hours. ABG    Component Value Date/Time   PHART 7.473 (H) 10/05/2015 2223   HCO3 26.7 (H) 10/05/2015 2223   TCO2 24.2 10/05/2015 2223   O2SAT 92.2 10/05/2015 2223   CBG (last 3)   Recent Labs  10/22/15 1935 10/22/15 2311 10/23/15 0310  GLUCAP 137* 159* 145*    Assessment/Plan: S/P Procedure(s) (LRB): VIDEO ASSISTED THORACOSCOPY (VATS) with Drainage of Pleural Effusion (Right) DRAINAGE OF PLEURAL  EFFUSION (Right)  1. Chest tube- 3+ air leak with cough 2. Pulm- CXR remains stable, continue oxygen, will change nebs to scheduled every 6 hours 3. GI- continued abdominal pain, NG tube in place, GI following, PET CT as outpatient 4. Anxiety- will increase Valium to every 6 hours 5. Dispo- continue chest tube, continue currentl care   LOS: 10 days    Ellwood Handler 10/23/2015   Chart reviewed, patient examined, agree with above. CXR looks stable with small basilar space which was present on suction so I would leave her on water seal. She still has a small air leak but it will seal. Her lungs were very friable. Dr. Lyndon Code called me about the pleural biopsies and thinks that there may be carcinoma there but he wanted to send off for a second opinion and do more stains. She did have pleural thickening on her prior CT scan and hypermetabolic activity focally there on PET. She also has a right acetabular lytic lesion on recent CT and her PET showed a left posterior 7th rib abnormality with lucency and hypermetabolism. I wonder if these areas are all related to the pancreatic abnormality.

## 2015-10-24 ENCOUNTER — Inpatient Hospital Stay (HOSPITAL_COMMUNITY): Payer: Medicare Other

## 2015-10-24 DIAGNOSIS — R0602 Shortness of breath: Secondary | ICD-10-CM | POA: Diagnosis present

## 2015-10-24 LAB — GLUCOSE, CAPILLARY
GLUCOSE-CAPILLARY: 147 mg/dL — AB (ref 65–99)
GLUCOSE-CAPILLARY: 153 mg/dL — AB (ref 65–99)
Glucose-Capillary: 160 mg/dL — ABNORMAL HIGH (ref 65–99)
Glucose-Capillary: 129 mg/dL — ABNORMAL HIGH (ref 65–99)
Glucose-Capillary: 135 mg/dL — ABNORMAL HIGH (ref 65–99)

## 2015-10-24 MED ORDER — PRO-STAT SUGAR FREE PO LIQD
30.0000 mL | Freq: Two times a day (BID) | ORAL | Status: DC
Start: 2015-10-24 — End: 2015-10-29
  Administered 2015-10-24 – 2015-10-28 (×9): 30 mL
  Filled 2015-10-24 (×9): qty 30

## 2015-10-24 MED ORDER — ALPRAZOLAM 0.5 MG PO TABS
0.5000 mg | ORAL_TABLET | Freq: Three times a day (TID) | ORAL | Status: DC
  Administered 2015-10-24 – 2015-10-25 (×2): 0.5 mg via ORAL
  Filled 2015-10-24 (×3): qty 1

## 2015-10-24 MED ORDER — VITAL AF 1.2 CAL PO LIQD
1000.0000 mL | ORAL | Status: DC
  Administered 2015-10-24 – 2015-10-27 (×3): 1000 mL
  Filled 2015-10-24 (×7): qty 1000

## 2015-10-24 NOTE — Progress Notes (Signed)
Daily Rounding Note  10/24/2015, 9:04 AM  LOS: 11 days   SUBJECTIVE:   Chief complaint: feels bloated and distended.   Wants the tube feed out.  Says the LUQ pain is better.  No BM yesterday.   No nausea.  Just looked over meds and noticed that the CVTS switched her off scheduled valium onto prn yesterday, she has gotten this at intervals close to or longer than every 6 hours..   OBJECTIVE:         Vital signs in last 24 hours:    Temp:  [98.2 F (36.8 C)-98.8 F (37.1 C)] 98.3 F (36.8 C) (07/27 2310) Pulse Rate:  [109-111] 111 (07/27 2310) Resp:  [24-31] 31 (07/27 2310) BP: (106-130)/(53-112) 111/53 (07/27 2310) SpO2:  [94 %-100 %] 100 % (07/28 0101) Weight:  [72 kg (158 lb 11.7 oz)] 72 kg (158 lb 11.7 oz) (07/28 0456) Last BM Date: 10/21/15 Filed Weights   10/22/15 [id number] 10/23/15 0641 10/24/15 0456  Weight: 68.8 kg (151 lb 9.6 oz) 72.5 kg (159 lb 13.3 oz) 72 kg (158 lb 11.7 oz)   General: anxious, uncomfortable but not in acute pain   Heart: tachy to 1teens, regular Chest: clear bil.  CT still in place Abdomen: soft, ND.  Less tender in RUQ and not tender in remainder of belly.  Soft, not obviously distended.  BS slightly higher pitched but quite active.   Extremities: + non-pitting LE edema.  Neuro/Psych:  Anxious: this is not improved. Fully alert.  Moves all  4s.    Intake/Output from previous day: 07/27 0701 - 07/28 0700 In: 1900 [I.V.:1150; NG/GT:750] Out: 610 [Urine:300; Chest Tube:310]  Intake/Output this shift: No intake/output data recorded.  Lab Results:  Recent Labs  10/22/15 0410  HCT 27.5*   BMET No results for input(s): NA, K, CL, CO2, GLUCOSE, BUN, CREATININE, CALCIUM in the last 72 hours. LFT No results for input(s): PROT, ALBUMIN, AST, ALT, ALKPHOS, BILITOT, BILIDIR, IBILI in the last 72 hours. PT/INR No results for input(s): LABPROT, INR in the last 72 hours. Hepatitis Panel No  results for input(s): HEPBSAG, HCVAB, HEPAIGM, HEPBIGM in the last 72 hours.  Studies/Results: Dg Chest Port 1 View  Result Date: 10/24/2015 CLINICAL DATA:  Pneumothorax.  Chest tube . EXAM: PORTABLE CHEST 1 VIEW COMPARISON:  10/23/2015. FINDINGS: Lines and tubes including right chest tube in stable position. Stable small right lung base pneumothorax. Heart size stable. Persistent mild interstitial prominence noted a right lung. Persistent small bilateral pleural effusions. No interim change. Mild right chest wall subcutaneous emphysema . IMPRESSION: 1. Lines and tubes including right chest tube in stable position. Stable right basilar tiny pneumothorax. Mild right chest wall subcutaneous emphysema. 2. Persistent mild interstitial prominence right lung. Persistent small bilateral pleural effusions. Electronically Signed   By: Marcello Moores  Register   On: 10/24/2015 07:19  Dg Chest Port 1 View  Result Date: 10/23/2015 CLINICAL DATA:  Pneumothorax.  Chest tube . EXAM: PORTABLE CHEST 1 VIEW COMPARISON:  10/22/2015 . FINDINGS: Right IJ line and feeding tube in stable position. Right chest tube in stable position. Stable tiny right base pneumothorax. Persistent mild right lung interstitial prominence and left base subsegmental atelectasis. Persistent small bilateral pleural effusions . Heart size stable. IMPRESSION: 1. Lines and tubes including right chest tube in stable position. Persistent small right basilar pneumothorax. 2. Persistent interstitial prominence right lung. Persistent subsegmental atelectasis left base. Persistent small bilateral pleural effusions. Chest is unchanged  prior exam. Electronically Signed   By: Marcello Moores  Register   On: 10/23/2015 07:50  Dg Chest Port 1 View  Result Date: 10/22/2015 CLINICAL DATA:  Shortness of breath EXAM: PORTABLE CHEST 1 VIEW COMPARISON:  10/22/2015 FINDINGS: Feeding tube coursing below the diaphragm. Right IJ central venous catheter projecting over the cavoatrial  junction. Right-sided chest tube in unchanged position. Persistent small right basilar pneumothorax. Small right pleural effusion. Interstitial thickening of the right lung. No focal consolidation. Small left pleural effusion. No left pneumothorax. Stable cardiomediastinal silhouette. IMPRESSION: 1. Right-sided chest tube with persistent small right hydropneumothorax. 2. Small left pleural. Electronically Signed   By: Kathreen Devoid   On: 10/22/2015 19:27  Scheduled Meds: . aspirin  81 mg Oral Daily  . bisacodyl  10 mg Oral Daily  . chlorhexidine  15 mL Mouth Rinse BID  . cloNIDine  0.1 mg Oral TID  . enoxaparin (LOVENOX) injection  40 mg Subcutaneous Q24H  . famotidine  20 mg Oral Daily  . feeding supplement (ENSURE ENLIVE)  237 mL Oral BID BM  . fluticasone furoate-vilanterol  1 puff Inhalation Daily  . levalbuterol  0.63 mg Nebulization Q6H  . multivitamin with minerals  1 tablet Oral Daily  . simethicone  80 mg Oral QID  . sodium chloride flush  10-40 mL Intracatheter Q12H  . sodium chloride flush  3 mL Intravenous Q12H  . sodium chloride flush  3 mL Intravenous Q12H   Continuous Infusions: . dextrose 5 % and 0.9% NaCl 50 mL/hr at 10/24/15 0644  . feeding supplement (VITAL AF 1.2 CAL) 1,000 mL (10/22/15 1124)   PRN Meds:.acetaminophen **OR** acetaminophen, alum & mag hydroxide-simeth, diazepam, fentaNYL (SUBLIMAZE) injection, ondansetron **OR** ondansetron (ZOFRAN) IV, phenol, polyethylene glycol, potassium chloride, sodium chloride flush, traMADol   ASSESMENT:   * Pancreatitis of unclear etiology.  ? Due to Wegener's vs ? malignancy? 7/3 and7/24 CT scans:increased size and # of peripancreatic fluid collections unable to rule out cystic neoplasm. CA-19-9 is 535, normal CEA: raising stakes for cystic neoplasm.  Initial clinical presentation 08/2015.  Cholecystectomy 2011. Likely needsanother PET scan as outpt. C/o bloating/distention, Simethicone has been in place since 7/23. Rule  out ileus.   * Hypoalbuminemia. Protein cal malnutrition, prealbumin 16.2.c/o bloating from tube feeds.   *  Insomnia.  Will defer mgt of this to CVTS vs CCM, however better sleep hygiene would ceraitnly help her mood and pain perception.   * 10/18/2015 VATS assisted right empyema drainage, WBCs but no organisms on stain. Chest tube in place. 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 Cytology 7/3 and 7/6, negative for cancer.  Dr Cyndia Bent hopeful for removal of chest tube in nest 24 to 48 hours.   * Lytic lesion right hip. 07/07/15 PET-CT: hypermetabolic activity in right lung (subsequently proven not to have cancer), left ribs, nothing in hips. CA-19-9 is 535, normal CEA. Raises question of underlying pancreatic malignancy. Likely needsanother PET scan as outpt.   * Depression, anxiety.  Significantly impeding progress.  Previous acute situational depression 2013 - 2014.  PMD notes from then reviewed: mention "doing better" on Prozac. Seroquel caused palpitations. Trazodone worsened insomnia. Inderal for tremor/palpitations worsened depression.   * Anemia, macrocytic. Hgb improved. Hx hemochromatosis treated with Phlebotomy (last on 06/16/2015), followed by Dr [name] . Folate processing, B12 normal.   * Hx chronic diarrhea, suspected post cholecystectomy related. ?IBS-D.  Receiving daily Dulcolax andcurrently not having issues with diarrhea.  Colonoscopy 06/2014: benign cecal polyp,  normal random biopsies, non-bleeding cecal AVM.   * Wegener's granulomatosis, polyangiitis. P-ANCA elevated at 1:1.6 on 07/23/15, normal at <1:20 on 10/03/15. Receiving weekly Rituxan infusions through rheumatologist, Dr [name] for a few weeks PTA.  Initial clinical presentation was 08/2015.   * Hypoalbuminemia. Protein cal malnutrition, prealbumin 16.2.tolerating tube feeds, dislikes feeding tube but willing to keep in place for now.   * 10/26/2015 VATS  assisted right empyema drainage, WBCs but no organisms on stain. Chest tube in place. 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 Cytology 7/3 and 7/6, negative for cancer.   * Lytic lesion right hip. 07/07/15 PET-CT: hypermetabolic activity in right lung (subsequently proven not to have cancer), left ribs, nothing in hips. CA-19-9 is 535, normal CEA. Raises question of underlying pancreatic malignancy. Likely needsanother PET scan as outpt. This info has not been discussed with pt: fear it will only add fuel to the fire in terms of anxiety.   * Depression, anxiety. This along with pain causing subpar participation in PT. 2013 - 2014 PMD notes mention "doing better" on Prozac. Seroquel caused palpitations. Trazodone worsened insomnia. Inderal for tremor/palpitations worsened depression.   * Anemia, macrocytic. Hereditary hemochromatosis, last phlebotomy 06/16/2015, followed by Dr [name] . Folate, B12 normal.   * Chronic diarrhea, suspected post cholecystectomy related. ?IBS-D. currently not having issues with diarrhea.  Colonoscopy 06/2014: benign cecal polyp, normal random biopsies, non-bleeding cecal AVM.   * Wegener's granulomatosis, polyangiitis. P-ANCA elevated at 1:1.6 on 07/23/15, normal at <1:20 on 10/03/15. Receiving weekly Rituxan infusions through rheumatologist, Dr [name] for a few weeks PTA.      PLAN   *  Reduce tube feed rate to 40 ml/hour for now, portable abdominal film, dulcolax PR to induce BM.    *  At this point her anxiety is impeding progress, so may want to call in psych.  Curbside eval with Pall Care NP who suggested that Xanax may be more helpful than Valium.  So switched her to scheduled Xanax.   ? If chaplain services might be of benefit, perhaps they offer mindfullness/breathing exercises to help her?      [name] [name]  10/24/2015, 9:04 AM Pager: [phone]

## 2015-10-24 NOTE — Progress Notes (Addendum)
Nutrition Follow-up  DOCUMENTATION CODES:   Not applicable  INTERVENTION:    Continue TF via Cortrak tube with Vital AF 1.2 at 40 ml/h.  Add Prostat 30 ml BID via Cortrak tube to increase intake to 1352 kcals (85% of estimated needs) and 102 gm protein (100% of estimated needs).  NUTRITION DIAGNOSIS:   Inadequate oral intake related to acute illness, other (see comment) (and early satiety and dislike of hospital food) as evidenced by per patient/family report, meal completion < 50%.  Ongoing.  GOAL:   Patient will meet greater than or equal to 90% of their needs  Met.  MONITOR:   PO intake, TF tolerance, Labs, I & O's  REASON FOR ASSESSMENT:   Malnutrition Screening Tool    ASSESSMENT:   69 year old female with history of COPD, HTN, HLD, anxiety, and depression. She feels miserable because of a bowel pain and depressed and also has some Cushing features. She blames all her problems on prednisone. She presented to the pulmonary office today with complaints of worsening Dyspnea. CXR indicates worsening of the right exudative pleural effusion.  Patient complaining of abdominal distention and bloating, so TF rate was decreased to 40 ml/h this morning. Vital AF 1.2 at 40 ml/h is providing 1152 kcal, 72 gm protein, 779 ml free water daily. This does not quite meet nutrition needs.  Patient is consuming none of regular diet meals.   Diet Order:  Diet regular Room service appropriate? Yes; Fluid consistency: Thin  Skin:  Reviewed, no issues  Last BM:  7/25  Height:   Ht Readings from Last 1 Encounters:  10/03/2015 '5\' 3"'$  (1.6 m)    Weight:   Wt Readings from Last 1 Encounters:  10/24/15 158 lb 11.7 oz (72 kg)    Ideal Body Weight:  52.3 kg  BMI:  Body mass index is 28.12 kg/m.  Estimated Nutritional Needs:   Kcal:  1600-1800  Protein:  90-100 grams  Fluid:  1.6-1.8 L/day  EDUCATION NEEDS:   No education needs identified at this time  Molli Barrows,  [place], [place], [place] Pager [phone] After Hours Pager [phone]

## 2015-10-24 NOTE — Progress Notes (Signed)
   10/24/15 1100  Clinical Encounter Type  Visited With Patient  Visit Type Follow-up  Referral From Chaplain  Consult/Referral To Chaplain  Spiritual Encounters  Spiritual Needs Emotional;Prayer  CHP followed up with patient.  Patient is still very discouraged.  Provided presence, listening and emotional support. Roe Coombs 10/24/15

## 2015-10-24 NOTE — Progress Notes (Addendum)
Name: [name] [name] MRN: [id number] DOB: [birth year]    ADMISSION DATE:  10/09/2015  CHIEF COMPLAINT:  Dyspnea  BRIEF PATIENT DESCRIPTION: 70 year old female with R hydropneumothorax admitted from office for CVTS evaluation.  STUDIES:  09/16/15 TTE: Normal LV size with mild LV hypertrophy, EF 65-70%. Normal RV size and systolic function. No significant valvular abnormalities. 7/07 CT Chest: moderate R hydropneumothorax, abnormal pleural thickening with scattered areas of pleural fluid in the R hemithorax, additional subpleural reticulation / fibrosis with interlobular septal thickening R>L, fluid along the pancreatic body/tail related to known acute pancreatitis 7/03 Cytology from [facility]: acute inflammation, no malignant cells 7/07 Cytology from [facility]: no malignant cells    MICROBIOLOGY:  Urine Ctx 7/2:  Recommended recollection Urine Ctx 7/3:  Recommended recollection R Pleural Fluid 7/3:  Negative R Pleural Fluid 7/6:  Negative MRSA PCR 7/20:  Negative R Pleural Fluid 7/21> neg gm stain>> R Parietal Pleural Biopsy 7/21> neg   ANTIBIOTICS: Cefuroxime 7/20 - 7/21 (surgical prophylaxis)   SUBJECTIVE:   Abd pain better. Tolerating TF.  More comfortable today.  Wants feeding tube out No air leak on chest tube.  Very anxious and depressed. Sleeping better    VITAL SIGNS:   Temp:  [97.4 F (36.3 C)-98.3 F (36.8 C)] 97.4 F (36.3 C) (07/28 0830) Pulse Rate:  [109-126] 126 (07/28 0830) Resp:  [22-31] 22 (07/28 0830) BP: (111-130)/(53-112) 115/66 (07/28 0830) SpO2:  [95 %-100 %] 98 % (07/28 0909) Weight:  [72 kg (158 lb 11.7 oz)] 72 kg (158 lb 11.7 oz) (07/28 0456)   Physical Exam: General: Elderly female in NAD. Comfortable. Not in distress.  HEENT: Moist mucus membranes. No scleral injection or icterus. Pupils symmetric.  Cardiac:good s1/s2. Regular rhythm. (-) s3/m/r/g Pulmonary: Clear bilaterally to auscultation.  Right chest tube in place > on water seal.    Abdomen:  Soft. NT/ (-) masses. Tenderness in LUQ and RUQ > same Neurological:  CN in tact. Grossly nonfocal. Integument:  Warm & dry. No rash on exposed skin.   Recent Labs Lab 10/19/15 0515 10/20/15 0515 10/21/15 0436  NA 139 138 138  K 3.5 3.4* 3.9  CL 105 104 106  CO2 30 29 26   BUN 17 12 12   CREATININE 0.48 0.44 0.51  GLUCOSE 96 88 99    Recent Labs Lab 10/19/15 0515 10/20/15 0515 10/21/15 0430 10/22/15 0410  HGB 9.9* 8.5* 9.5*  --   HCT 32.1* 27.2* 30.5* 27.5*  WBC 13.2* 10.6* 11.6*  --   PLT 263 222 249  --    Dg Chest Port 1 View  Result Date: 10/24/2015 CLINICAL DATA:  Pneumothorax.  Chest tube . EXAM: PORTABLE CHEST 1 VIEW COMPARISON:  10/23/2015. FINDINGS: Lines and tubes including right chest tube in stable position. Stable small right lung base pneumothorax. Heart size stable. Persistent mild interstitial prominence noted a right lung. Persistent small bilateral pleural effusions. No interim change. Mild right chest wall subcutaneous emphysema . IMPRESSION: 1. Lines and tubes including right chest tube in stable position. Stable right basilar tiny pneumothorax. Mild right chest wall subcutaneous emphysema. 2. Persistent mild interstitial prominence right lung. Persistent small bilateral pleural effusions. Electronically Signed   By: Marcello Moores  Register   On: 10/24/2015 07:19  Dg Chest Port 1 View  Result Date: 10/23/2015 CLINICAL DATA:  Pneumothorax.  Chest tube . EXAM: PORTABLE CHEST 1 VIEW COMPARISON:  10/22/2015 . FINDINGS: Right IJ line and feeding tube in stable position. Right chest tube  in stable position. Stable tiny right base pneumothorax. Persistent mild right lung interstitial prominence and left base subsegmental atelectasis. Persistent small bilateral pleural effusions . Heart size stable. IMPRESSION: 1. Lines and tubes including right chest tube in stable position. Persistent small right basilar pneumothorax. 2. Persistent interstitial prominence right  lung. Persistent subsegmental atelectasis left base. Persistent small bilateral pleural effusions. Chest is unchanged prior exam. Electronically Signed   By: Marcello Moores  Register   On: 10/23/2015 07:50  Dg Chest Port 1 View  Result Date: 10/22/2015 CLINICAL DATA:  Shortness of breath EXAM: PORTABLE CHEST 1 VIEW COMPARISON:  10/22/2015 FINDINGS: Feeding tube coursing below the diaphragm. Right IJ central venous catheter projecting over the cavoatrial junction. Right-sided chest tube in unchanged position. Persistent small right basilar pneumothorax. Small right pleural effusion. Interstitial thickening of the right lung. No focal consolidation. Small left pleural effusion. No left pneumothorax. Stable cardiomediastinal silhouette. IMPRESSION: 1. Right-sided chest tube with persistent small right hydropneumothorax. 2. Small left pleural. Electronically Signed   By: Kathreen Devoid   On: 10/22/2015 19:27  Dg Abd Portable 1v  Result Date: 10/24/2015 CLINICAL DATA:  Current history of pancreatitis for which the patient is receiving jejunal tube feedings. The patient is currently not tolerating the tube feedings very well and has not had a bowel movement in approximately 36 hr. EXAM: PORTABLE ABDOMEN - 1 VIEW COMPARISON:  Abdomen x-ray 10/21/2015, 10/18/2015. CT abdomen and pelvis 10/19/2015. FINDINGS: Bowel gas pattern unremarkable without evidence of obstruction or significant ileus. Feeding tube tip not included on the image but the tube courses through the stomach and duodenum into the proximal jejunum. Surgical clips in the right upper quadrant from prior cholecystectomy. No visible opaque urinary tract calculi. IMPRESSION: No acute abdominal abnormality. Electronically Signed   By: Evangeline Dakin M.D.   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 Patient underwent VATS 7/21 c/w empyema  with culture and parietal pleural biopsy. Successfully extubated post-operative and transferred to the stepdown unit for further care.   Recurrent Right Exudative Pleural Effusion c/w empyema    S/P VATS 7/21 .  - Following culture and pathology from VATS > no official patholy report seen in EPIC. - I spoke with pathologist on 7/27> RLL specimen being worked up with stains; he thinks it might be carcinoma but nothing definite yet. I have NOT mentioned anything to family re: possible CA in the lung - CT on water seal. Anticipate d/c chest tube in am  Right Lower Lobe & Left Upper Lobe Masses:  Treated as ANCA (-) vasculitis (possible Wegener's dse) with Prednisone and Rituximab in June 2017.   Asthma vs COPD:  No signs of exacerbation.   - Continuing Breo in place of home Dulera.  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 Concern for cystic neoplasm (Pancreas) - GI following > eventual MRI/MRCP vs EUS as an oupt per GI. - tolerating TF - has abd pain which seems to be at baseline.   Possible lytic lesion in R acetabulum not seen in previous ct scan or PET scan.  - RLL pleura/lung final pathology is still pending. Pathologist is doing more stains. He thinks there is carcinoma in RLL specimen.  GI thinks there might be pancreatic cystic neoplasm. I suggest wait for final lung pathology and discuss with family.  Pt will need a rpt PET scan as an oupt.  Will also need GI evaln for pancreatic mass as an outpt as well if she can tolerate procedure.   Coronary Artery Disease and  HTN:   - Continuing ASA 81mg  daily - Clonidine   Anxiety/Depression - valium did NOT help her. - started on xanax 0.5 TID today > told RN to keep an eye on mental status. May need to dec dose if she ends up sleeping all day.   Tobacco abuse disorder - Cessation counseling prior to discharge    Family : plan  discussed with pt.  I did NOT mention anything re: CA to her.  I mentioned to daughter re: possible pancreatic CA on 7/26.  ]  J. Shirl Harris, MD 10/24/2015, 11:30 AM [facility] Pager [phone] After 3 pm or if no answer, call [phone]

## 2015-10-24 NOTE — Progress Notes (Signed)
Weaned fio2 to RA due to stable sats

## 2015-10-24 NOTE — Care Management Note (Signed)
Case Management Note  Patient Details  Name: [name] [name] MRN: [id number] Date of Birth: 1945/10/07  Subjective/Objective:     Patient conts with chest tube to suction, NG tube feeds. NCM will cont to follow for dc needs.                Action/Plan:   Expected Discharge Date:                  Expected Discharge Plan:  [place]  In-House Referral:     Discharge planning Services  CM Consult  Post Acute Care Choice:  Resumption of Svcs/PTA Provider Choice offered to:  Patient  DME Arranged:  Oxygen DME Agency:  [employer] (home oxygen)  HH Arranged:  RN, PT HH Agency:     Status of Service:  In process, will continue to follow  If discussed at Long Length of Stay Meetings, dates discussed:    Additional Comments:  Zenon Mayo, RN 10/24/2015, 6:24 PM

## 2015-10-24 NOTE — Progress Notes (Addendum)
7 Days Post-Op Procedure(s) (LRB): VIDEO ASSISTED THORACOSCOPY (VATS) with Drainage of Pleural Effusion (Right) DRAINAGE OF PLEURAL EFFUSION (Right) Subjective:  Does not feel well. Says her stomach is distended from the tube feeds.  Objective: Vital signs in last 24 hours: Temp:  [98.2 F (36.8 C)-98.8 F (37.1 C)] 98.3 F (36.8 C) (07/27 2310) Pulse Rate:  [109-111] 111 (07/27 2310) Cardiac Rhythm: Sinus tachycardia (07/28 0400) Resp:  [24-31] 31 (07/27 2310) BP: (106-130)/(53-112) 111/53 (07/27 2310) SpO2:  [94 %-100 %] 100 % (07/28 0101) Weight:  [72 kg (158 lb 11.7 oz)] 72 kg (158 lb 11.7 oz) (07/28 0456)  Hemodynamic parameters for last 24 hours:    Intake/Output from previous day: 07/27 0701 - 07/28 0700 In: 1900 [I.V.:1150; NG/GT:750] Out: 610 [Urine:300; Chest Tube:310] Intake/Output this shift: No intake/output data recorded.  General appearance: alert, cooperative and anxious, uncomfortable as usual Heart: regular rate and rhythm, S1, S2 normal, no murmur, click, rub or gallop Lungs: clear to auscultation bilaterally no air leak from chest tube this am.  Lab Results:  Recent Labs  10/22/15 0410  HCT 27.5*   BMET: No results for input(s): NA, K, CL, CO2, GLUCOSE, BUN, CREATININE, CALCIUM in the last 72 hours.  PT/INR: No results for input(s): LABPROT, INR in the last 72 hours. ABG    Component Value Date/Time   PHART 7.473 (H) 10/05/2015 2223   HCO3 26.7 (H) 10/05/2015 2223   TCO2 24.2 10/05/2015 2223   O2SAT 92.2 10/05/2015 2223   CBG (last 3)   Recent Labs  10/23/15 1928 10/23/15 2306 10/24/15 0439  GLUCAP 137* 151* 147*   CXR: tiny basilar space.  Assessment/Plan: S/P Procedure(s) (LRB): VIDEO ASSISTED THORACOSCOPY (VATS) with Drainage of Pleural Effusion (Right) DRAINAGE OF PLEURAL EFFUSION (Right)  The chest tube has tidaling but no air leak even with cough. Continue to water seal and will reevaluate in the am. Should be able to get  the tube out in the next day or two if the air leak remains stopped. The chest X-ray shows a tiny basilar space that is not going to go away. This is not a pneumothorax. The lung in this area is scarred and will not expand to fill the space. Final path pending.   LOS: 11 days    Gaye Pollack 10/24/2015

## 2015-10-25 ENCOUNTER — Inpatient Hospital Stay (HOSPITAL_COMMUNITY): Payer: Medicare Other

## 2015-10-25 DIAGNOSIS — E8770 Fluid overload, unspecified: Secondary | ICD-10-CM

## 2015-10-25 DIAGNOSIS — D539 Nutritional anemia, unspecified: Secondary | ICD-10-CM

## 2015-10-25 DIAGNOSIS — F418 Other specified anxiety disorders: Secondary | ICD-10-CM

## 2015-10-25 LAB — CBC
HCT: 28.2 % — ABNORMAL LOW (ref 36.0–46.0)
Hemoglobin: 8.6 g/dL — ABNORMAL LOW (ref 12.0–15.0)
MCH: 30.7 pg (ref 26.0–34.0)
MCHC: 30.5 g/dL (ref 30.0–36.0)
MCV: 100.7 fL — AB (ref 78.0–100.0)
PLATELETS: 241 10*3/uL (ref 150–400)
RBC: 2.8 MIL/uL — ABNORMAL LOW (ref 3.87–5.11)
RDW: 16.6 % — AB (ref 11.5–15.5)
WBC: 10.6 10*3/uL — AB (ref 4.0–10.5)

## 2015-10-25 LAB — COMPREHENSIVE METABOLIC PANEL
ALT: 15 U/L (ref 14–54)
AST: 14 U/L — ABNORMAL LOW (ref 15–41)
Albumin: 1.8 g/dL — ABNORMAL LOW (ref 3.5–5.0)
Alkaline Phosphatase: 78 U/L (ref 38–126)
Anion gap: 7 (ref 5–15)
BUN: 11 mg/dL (ref 6–20)
CHLORIDE: 104 mmol/L (ref 101–111)
CO2: 27 mmol/L (ref 22–32)
Calcium: 8 mg/dL — ABNORMAL LOW (ref 8.9–10.3)
Creatinine, Ser: 0.36 mg/dL — ABNORMAL LOW (ref 0.44–1.00)
Glucose, Bld: 124 mg/dL — ABNORMAL HIGH (ref 65–99)
POTASSIUM: 2.9 mmol/L — AB (ref 3.5–5.1)
Sodium: 138 mmol/L (ref 135–145)
Total Bilirubin: 0.5 mg/dL (ref 0.3–1.2)
Total Protein: 4.2 g/dL — ABNORMAL LOW (ref 6.5–8.1)

## 2015-10-25 LAB — GLUCOSE, CAPILLARY
GLUCOSE-CAPILLARY: 135 mg/dL — AB (ref 65–99)
GLUCOSE-CAPILLARY: 147 mg/dL — AB (ref 65–99)
GLUCOSE-CAPILLARY: 156 mg/dL — AB (ref 65–99)
Glucose-Capillary: 143 mg/dL — ABNORMAL HIGH (ref 65–99)
Glucose-Capillary: 152 mg/dL — ABNORMAL HIGH (ref 65–99)
Glucose-Capillary: 135 mg/dL — ABNORMAL HIGH (ref 65–99)
Glucose-Capillary: 114 mg/dL — ABNORMAL HIGH (ref 65–99)

## 2015-10-25 LAB — IRON AND TIBC
IRON: 25 ug/dL — AB (ref 28–170)
Saturation Ratios: 16 % (ref 10.4–31.8)
TIBC: 154 ug/dL — AB (ref 250–450)
UIBC: 129 ug/dL

## 2015-10-25 LAB — VITAMIN B12: Vitamin B-12: 465 pg/mL (ref 180–914)

## 2015-10-25 LAB — FERRITIN: Ferritin: 217 ng/mL (ref 11–307)

## 2015-10-25 LAB — MAGNESIUM: MAGNESIUM: 1.9 mg/dL (ref 1.7–2.4)

## 2015-10-25 LAB — TSH: TSH: 2.781 u[IU]/mL (ref 0.350–4.500)

## 2015-10-25 LAB — POTASSIUM: Potassium: 3.4 mmol/L — ABNORMAL LOW (ref 3.5–5.1)

## 2015-10-25 MED ORDER — ALPRAZOLAM 0.5 MG PO TABS
0.5000 mg | ORAL_TABLET | Freq: Three times a day (TID) | ORAL | Status: DC
  Administered 2015-10-25 – 2015-10-26 (×3): 0.5 mg via ORAL
  Filled 2015-10-25 (×2): qty 1

## 2015-10-25 MED ORDER — FUROSEMIDE 10 MG/ML IJ SOLN
20.0000 mg | Freq: Once | INTRAMUSCULAR | Status: AC
  Administered 2015-10-25: 20 mg via INTRAVENOUS
  Filled 2015-10-25: qty 2

## 2015-10-25 MED ORDER — METOPROLOL TARTRATE 25 MG PO TABS
25.0000 mg | ORAL_TABLET | Freq: Two times a day (BID) | ORAL | Status: DC
  Administered 2015-10-25 – 2015-10-26 (×2): 25 mg via ORAL
  Filled 2015-10-25 (×3): qty 1

## 2015-10-25 MED ORDER — POTASSIUM CHLORIDE 20 MEQ PO PACK
40.0000 meq | PACK | Freq: Once | ORAL | Status: AC
  Administered 2015-10-25: 40 meq via JEJUNOSTOMY
  Filled 2015-10-25: qty 2

## 2015-10-25 NOTE — Progress Notes (Signed)
Patient complaining of increasing abdominal discomfort.  Pt. ABD is soft but distended at this time.  Pt. States "it's my feeding."  Pt. Stated "only time it doesn't hurt is when it's off."  Pt. Requesting it be stopped.  Placed TF on hold and contacted Dr. Hilarie Fredrickson.  Will continue to follow up.

## 2015-10-25 NOTE — Progress Notes (Signed)
Progress Note   Subjective  Called by RN with patient complaining of LUQ abd pain, wanting TFs stopped. Pt anxious and refusing xanax last night and this am Had BM after laxative yesterday, and 2 today (nonmelenic) Daughter at bedside today, pt now more calm and did take xanax 30 min ago TFs held by RN x 1 hr Pt reports increased cough and mild dyspnea Abd feels distended, legs feel swollen Feels urine outpt has decreased   Objective  Vital signs in last 24 hours: Temp:  [97.7 F (36.5 C)-98.3 F (36.8 C)] 98.3 F (36.8 C) (07/29 1136) Pulse Rate:  [111-121] 119 (07/29 1136) Resp:  [23-36] 33 (07/29 1136) BP: (97-131)/(53-89) 128/89 (07/29 1136) SpO2:  [95 %-99 %] 97 % (07/29 1328) Weight:  [161 lb 2.5 oz (73.1 kg)] 161 lb 2.5 oz (73.1 kg) (07/29 0410) Last BM Date: 10/24/15  General: Alert, and lying calmly in bed, nasal feeding tube clamped, NAD Heart:  Tachy, reg Chest: course b/l, decreased b/l bases Abdomen:  Soft, significantly tender epigastrium (L>>R), mildly distended, BS slightly hypoactive today but present Extremities:  Trace to 1+ edema Neurologic:  Alert and  oriented x4; grossly normal neurologically. Psych:  Alert and cooperative. Depressed mood and currently not anxious appearing  Intake/Output from previous day: 07/28 0701 - 07/29 0700 In: 2174 [P.O.:25; I.V.:1251.7; NG/GT:897.3] Out: 360 [Urine:100; Chest Tube:260] Intake/Output this shift: Total I/O In: 330 [I.V.:300; NG/GT:30] Out: 351 [Urine:350; Stool:1]  Lab Results:  Recent Labs  10/25/15 1320  WBC 10.6*  HGB 8.6*  HCT 28.2*  PLT 241   Studies/Results: Dg Chest Port 1 View  Result Date: 10/25/2015 CLINICAL DATA:  Chest tube in place EXAM: PORTABLE CHEST 1 VIEW COMPARISON:  October 24, 2015 FINDINGS: A right-sided chest tube remains in place. A small pneumothorax remains in the right base, unchanged. Increased interstitial markings in the right lung versus the left are mildly more  prominent in the interval, possibly due to technique. A layering effusion and underlying atelectasis on the left remain. Support apparatus is stable. IMPRESSION: 1. Stable support apparatus including a right chest tube. Lucency in the right costophrenic angle consistent with persistent but stable basilar pneumothorax. 2. Increased interstitial markings throughout the right lung versus the left are mildly more prominent in the interval, possibly technical in nature. 3. Small left effusion and underlying atelectasis. Electronically Signed   By: Dorise Bullion III M.D   On: 10/25/2015 08:52  Dg Chest Port 1 View  Result Date: 10/24/2015 CLINICAL DATA:  Pneumothorax.  Chest tube . EXAM: PORTABLE CHEST 1 VIEW COMPARISON:  10/23/2015. FINDINGS: Lines and tubes including right chest tube in stable position. Stable small right lung base pneumothorax. Heart size stable. Persistent mild interstitial prominence noted a right lung. Persistent small bilateral pleural effusions. No interim change. Mild right chest wall subcutaneous emphysema . IMPRESSION: 1. Lines and tubes including right chest tube in stable position. Stable right basilar tiny pneumothorax. Mild right chest wall subcutaneous emphysema. 2. Persistent mild interstitial prominence right lung. Persistent small bilateral pleural effusions. Electronically Signed   By: Marcello Moores  Register   On: 10/24/2015 07:19  Dg Abd Portable 1v  Result Date: 10/24/2015 CLINICAL DATA:  Current history of pancreatitis for which the patient is receiving jejunal tube feedings. The patient is currently not tolerating the tube feedings very well and has not had a bowel movement in approximately 36 hr. EXAM: PORTABLE ABDOMEN - 1 VIEW COMPARISON:  Abdomen x-ray 10/21/2015, 10/18/2015. CT abdomen and  pelvis 10/19/2015. FINDINGS: Bowel gas pattern unremarkable without evidence of obstruction or significant ileus. Feeding tube tip not included on the image but the tube courses through  the stomach and duodenum into the proximal jejunum. Surgical clips in the right upper quadrant from prior cholecystectomy. No visible opaque urinary tract calculi. IMPRESSION: No acute abdominal abnormality. Electronically Signed   By: Evangeline Dakin M.D.   On: 10/24/2015 11:25     Assessment & Plan  * Pancreatitis of unclear etiology. ? Due to Wegener's vs ? malignancy?7/3 and7/24 CT scans:increased size and # of peripancreatic fluid collections unable to rule out cystic neoplasm. CA-19-9 is 535, normal CEA Initial clinical presentation 08/2015 (initially thought pancreatitis may have been drug-induced (possibly Bactrim)). Cholecystectomy 2011.  Simethicone has been in place since 7/23. Rule out ileus.   * Hypoalbuminemia. Protein cal malnutrition, prealbumin 16.2.c/o bloating from tube feeds.   * Insomnia. Will defer mgt of this to CVTS vs CCM, however better sleep hygiene would ceraitnly help her mood and pain perception.   * 10/24/2015 VATS assisted right empyema drainage, WBCs but no organisms on stain. Chest tube in place. 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 Cytology 7/3 and 7/6, negative for cancer.  Dr Cyndia Bent hopeful for removal of chest tube in nest 24 to 48 hours.   * Lytic lesion right hip. 07/07/15 PET-CT: hypermetabolic activity in right lung (subsequently proven not to have cancer), left ribs, nothing in hips. CA-19-9 is 535, normal CEA. Raises question of underlying pancreatic malignancy. Likely needsanother PET scan as outpt.   * Depression, anxiety.  Significantly impeding progress.  Previous acute situational depression 2013 - 2014.  PMD notes from then reviewed: mention "doing better" on Prozac. Seroquel caused palpitations. Trazodone worsened insomnia. Inderal for tremor/palpitations worsened depression.   * Anemia, macrocytic. Hgb trending down slightly. Hx hemochromatosis treated with Phlebotomy (last on  06/16/2015), followed by Dr [name] . Folate processing, B12 normal.   * Hx chronic diarrhea, suspected post cholecystectomy related. ?IBS-D. Receiving daily Dulcolax andcurrently not having issues with diarrhea.  Colonoscopy 06/2014: benign cecal polyp, normal random biopsies, non-bleeding cecal AVM.   * Wegener's granulomatosis, polyangiitis. P-ANCA elevated at 1:1.6 on 07/23/15, normal at <1:20 on 10/03/15. Receiving weekly Rituxan infusions through rheumatologist, Dr [name] for a few weeks PTA.  Initial clinical presentation was 08/2015.   * Hypoalbuminemia. Protein cal malnutrition, prealbumin 16.2.tolerating tube feeds, dislikes feeding tube but willing to keep in place for now.   The above taken in summary from Billie Lade note yesterday:  1. Pancreatitis - subacute associated with peripancreatic fluid collections. Etiology unclear. Concern for a mid pancreatic mass not yet seen by imaging. Peripancreatic fluid collections causing gastric compression and pain with eating. Continues to have considerable left upper quadrant pain. We have been feeding postpyloric for several days though she is complaining of increasing abdominal distention and pain. --Long discussion about the importance of to the given her significant illness and inability to take by mouth. She is having bowel movements and abdominal x-ray yesterday negative for ileus. Will hold tube feeds for the remainder of today and reassess tomorrow. Repeat abdominal x-ray tomorrow --Eventually needs MRI abdomen/MRCP versus EUS to reassess pancreas. If no improvement over the next several days consider proceeding with MRI abdomen/MRCP --Pain control  2. Abdominal distention/lower extremity edema/mild dyspnea -- concern for mild volume overload. CMP ordered today. I will consult internal medicine for help with these issues.  3. Anxiety and depression -- very  significant issue for her at present. We have been treating with  Valium and most recently alprazolam she has been refusing doses. I have consulted psychiatry for help with these issues as they are interfering with her general medical conditions and the treatment of the same. Continue Xanax on schedule for now until psychiatry input  4. Macrocytic anemia -- hemoglobin has slowly declined without evidence of GI bleeding. Monitor serially.  5. Empyema with pneumothorax -- chest tube remains in place followed closely by CT surgery   Active Problems:   Pleural effusion, right   Pleural effusion associated with pancreatitis   Pneumothorax   Acute respiratory failure with hypoxia (HCC)   Abdominal discomfort   LUQ pain   Protein-calorie malnutrition (HCC)   Pancreatitis   SOB (shortness of breath)     LOS: 12 days   Keisa Blow M  10/25/2015, 2:02 PM HC:[id number] 8a-5p weekdays [phone] after 5p, weekends, holidays

## 2015-10-25 NOTE — Progress Notes (Signed)
Patient ambulated to chair using front wheel walker.  HR increased to 120s but returned to 100s upon rest.  Patient endorses severe pain with ambulation and moving.  Emotional support from RN and patient's daughter as well as medications and alternate therapies provided to relieve pain.  Patient remained sitting in chair for approximately 1 hour.  Patient then ambulated to [facility] was able to urinate, and then ambulated back to bed.  HR remained in the 100s.  Patient now resting.  Will continue to monitor.

## 2015-10-25 NOTE — Progress Notes (Addendum)
Began patients PRN Potassium replacement, received an order for Potassium 40MEQ per patients NGT.  Contacted Dr. Janne Napoleon received an order to complete the IV Potassium 10MEQ that is currently running and then administer the Potassium 40MEQ per NGT stop fluids at this time restart at midnight.  Once potassium is administered wait four hours and run a repeat Potassium level.

## 2015-10-25 NOTE — Consult Note (Addendum)
Medical Consultation   [name] [name]  [id number]  DOB: 10-19-1945  DOA: 10/15/2015  PCP: [name], MD    Requesting physician: Dr. Zenovia Jarred, gi  Reason for consultation: Medical assistance, le edema  History of Present Illness: [name] [name] is an 70 y.o. 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  She is status post a video-assisted thoracostomy with chest tube placement on 09/27/2015 for recurrent right exudative pleural effusion (felt 2nd to pancreatitis), and remains with a right chest tube in the right pleural space currently.   Of note, GI  has been unsure of the cause of the pancreatitis, thought possibly drug-induced etiology. However, they have a postpyloric feeding tube in place right now, and have been doing tube feedings for.  However TF currently on hold due to patient discomfort and abd distention.    Patient is also very anxious and depressed about her current medical problems and complaints of bilateral lower extremity swelling as well since she was recently hospitalized. She denies any calf tenderness, no acute shortness of breath currently.  Her abdomen is distended and diffusely uncomfortable for patient.   Review of Systems:  ROS Per HPI, o/w all systems reviewed and negative.   Past Medical History: Past Medical History:  Diagnosis Date  . Anxiety   . Arthritis    "all over me, I guess" (10/15/2015)  . ASTHMA   . 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 CAD (coronary artery disease)   . Chronic diarrhea- suspect post-cholecystectomy 02/18/2014    . Chronic kidney disease   . COPD (chronic obstructive pulmonary disease) with emphysema ([place])    mild dz on PFTs 11/2013  . Depression   . GERD   . 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 H/O hiatal hernia   . 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 Hx of adenomatous polyp of colon 07/25/2014  . Hyperlipidemia   . HYPERTENSION   . Hypertension   . Migraine    "hx; not anymore" (10/18/2015)  . On home oxygen therapy    "2L; 24/7" (10/18/2015)  . Osteopenia 10/07/2014   DEXA @ [facility] 09/2014: -1.9 L fem  . PONV (postoperative nausea and vomiting)   . Sinus headache   . Urinary tract infection    "recurrent sometimes" (10/16/2015)    Past Surgical History: Past Surgical History:  Procedure Laterality Date  . ABDOMINAL HYSTERECTOMY  ~ 1983  . APPENDECTOMY  ~ 1983  . BLADDER SUSPENSION  03/29/2002  . COLONOSCOPY W/ BIOPSIES    . CYSTECTOMY     cyst removed from intestines to ovary    . CYSTOSCOPY  01/24/14   NEGATIVE;Dr Ottelin  . DILATION AND CURETTAGE OF UTERUS    . ESOPHAGOGASTRODUODENOSCOPY    . LAPAROSCOPIC CHOLECYSTECTOMY  ~ 2011  . PLEURAL EFFUSION DRAINAGE Right 10/02/2015   Procedure: DRAINAGE OF PLEURAL EFFUSION;  Surgeon: Gaye Pollack, MD;  Location: [facility] OR;  Service: Thoracic;  Laterality: Right;  . TENDON RELEASE Left    wrist   . VIDEO ASSISTED THORACOSCOPY (VATS)/THOROCOTOMY Right 10/16/2015   Procedure: VIDEO ASSISTED THORACOSCOPY (VATS) with Drainage of Pleural Effusion;  Surgeon: Gaye Pollack, MD;  Location: [facility] OR;  Service: Thoracic;  Laterality: Right;     Allergies:   Allergies  Allergen Reactions  . Other Other (See Comments)    Please if giving pt anesthesia - she would like to have a scopolamine patch to prevent nausea   . Ciprofloxacin Hives and Other (See Comments)    Blisters on legs  . Codeine Nausea And Vomiting and Other (See Comments)    Sweating, "passes out"  . Macrobid [employer] Macro] Other (See Comments)    Stool  incontinence  . Morphine And Related Other (See Comments)    "hospital bed was shaking"  . Prevnar [Pneumococcal 13-Val Conj Vacc] Palpitations and Other (See Comments)    Pt c/o redness, swelling on arm after shot.  Pt c/o having respiratory problems and coughing ever since she got it.      Social History:  reports that she quit smoking about 8 weeks ago. Her smoking use included Cigarettes. She has a 27.00 pack-year smoking history. She has never used smokeless tobacco. She reports that she does not drink alcohol or use drugs.   Family History: Family History  Problem Relation Age of Onset  . Hypertension Mother   . Heart disease Mother   . Breast cancer Mother 52  . Diabetes Father   . Heart disease Father   . Alcohol abuse Other   . Arthritis Other   . Cirrhosis Brother   . Cirrhosis Brother   . Cirrhosis Brother   . Lung cancer Brother   . Colon cancer Neg Hx   . Colon polyps Neg Hx   . Esophageal cancer Neg Hx   . Rectal cancer Neg Hx   . Stomach cancer Neg Hx     Physical Exam: Vitals:   10/25/15 0753 10/25/15 0756 10/25/15 1136 10/25/15 1328  BP:   128/89   Pulse:   (!) 119   Resp:   (!) 33   Temp:   98.3 F (36.8 C)   TempSrc:   Oral   SpO2: 99% 99% 96% 97%  Weight:      Height:        Constitutional: morbid obese, chronically ill appearing,  Alert and awake, oriented x3 Eyes: PERLA, EOMI, irises appear normal, anicteric sclera,  ENMT: external ears and nose appear normal, normal hearing             Lips appears normal, oropharynx mucosa, tongue, posterior pharynx appear normal  Neck: neck appears normal, no masses, normal ROM, no thyromegaly, no JVD  CVS: S1-S2 clear, no murmur rubs or gallops,  normal pedal pulses,  Respiratory:  clear to auscultation bilaterally, no wheezing, rales or rhonchi. Respiratory effort normal. No accessory muscle use.  +right chest tube in place, no air leak noted Abdomen: soft, obese, slight MEG pain in palpation, distended  abdomet, hypoactive bs Musculoskeletal: no cyanosis, clubbing . 1+ pedal edema bilat,  No calf tenderness bilat, neg Homan's sign                   Neuro: Cranial nerves grossly II-XII intact. Moves all 4 extrem freely. Psych: judgement and insight appear normal, stable mood and affect, mental status Skin: no rashes or lesions or ulcers, no induration or nodules    Data reviewed:  I have personally reviewed following labs and imaging studies Labs:  CBC:  Recent Labs Lab 10/19/15 0515 10/20/15 0515 10/21/15 0430 10/22/15 0410 10/25/15 1320  WBC 13.2* 10.6* 11.6*  --  10.6*  NEUTROABS 9.6* 7.4 8.2*  --   --   HGB 9.9* 8.5* 9.5*  --  8.6*  HCT 32.1* 27.2* 30.5* 27.5* 28.2*  MCV 100.3* 99.3 101.0*  --  100.7*  PLT 263 222 249  --  [id number]    Basic Metabolic Panel:  Recent Labs Lab 10/19/15 0515 10/20/15 0515 10/21/15 0430 10/21/15 0436 10/25/15 1320  NA 139 138  --  138 138  K 3.5 3.4*  --  3.9 2.9*  CL 105 104  --  106 104  CO2 30 29  --  26 27  GLUCOSE 96 88  --  99 124*  BUN 17 12  --  12 11  CREATININE 0.48 0.44  --  0.51 0.36*  CALCIUM 8.4* 8.1*  --  8.1* 8.0*  MG 2.0 2.0 1.9  --   --   PHOS 3.2 3.4  --  3.1  --    GFR Estimated Creatinine Clearance: 63.6 mL/min (by C-G formula based on SCr of 0.8 mg/dL). Liver Function Tests:  Recent Labs Lab 10/19/15 0515 10/20/15 0515 10/21/15 0436 10/25/15 1320  AST  --   --   --  14*  ALT  --   --   --  15  ALKPHOS  --   --   --  78  BILITOT  --   --   --  0.5  PROT  --   --   --  4.2*  ALBUMIN 2.0* 1.9* 1.9* 1.8*    Recent Labs Lab 10/23/15 0312  LIPASE 99*   No results for input(s): AMMONIA in the last 168 hours. Coagulation profile No results for input(s): INR, PROTIME in the last 168 hours.  Cardiac Enzymes: No results for input(s): CKTOTAL, CKMB, CKMBINDEX, TROPONINI in the last 168 hours. BNP: Invalid input(s): POCBNP CBG:  Recent Labs Lab 10/24/15 1915 10/24/15 2346 10/25/15 0408  10/25/15 0719 10/25/15 1126  GLUCAP 135* 147* 143* 156* 152*   D-Dimer No results for input(s): DDIMER in the last 72 hours. Hgb A1c No results for input(s): HGBA1C in the last 72 hours. Lipid Profile No results for input(s): CHOL, HDL, LDLCALC, TRIG, CHOLHDL, LDLDIRECT in the last 72 hours. Thyroid function studies No results for input(s): TSH, T4TOTAL, T3FREE, THYROIDAB in the last 72 hours.  Invalid input(s): FREET3 Anemia work up No results for input(s): VITAMINB12, FOLATE, FERRITIN, TIBC, IRON, RETICCTPCT in the last 72 hours. Urinalysis    Component Value Date/Time   COLORURINE YELLOW 09/29/2015 1148   APPEARANCEUR CLEAR 09/29/2015 1148   LABSPEC 1.007 09/29/2015 1148   PHURINE 5.5 09/29/2015 1148   GLUCOSEU NEGATIVE 09/29/2015 1148   GLUCOSEU NEGATIVE 07/23/2015 0901   HGBUR SMALL (A) 09/29/2015 1148   BILIRUBINUR NEGATIVE 09/29/2015 1148   BILIRUBINUR 3+ 05/21/2015 0932   KETONESUR NEGATIVE 09/29/2015 1148   PROTEINUR NEGATIVE 09/29/2015 1148   UROBILINOGEN 0.2 07/23/2015 0901   NITRITE NEGATIVE 09/29/2015 1148   LEUKOCYTESUR MODERATE (A) 09/29/2015 1148     Microbiology Recent Results (from the past 240 hour(s))  MRSA PCR Screening     Status: None   Collection Time: 10/16/15 11:24 PM  Result Value Ref Range Status   MRSA by PCR NEGATIVE NEGATIVE Final    Comment:        The GeneXpert MRSA Assay (FDA approved for NASAL specimens only), is one component of a comprehensive MRSA colonization surveillance program. It is not intended to diagnose MRSA infection nor to guide or monitor treatment for MRSA  infections.   Culture, body fluid-bottle     Status: None   Collection Time: 10/08/2015  8:42 AM  Result Value Ref Range Status   Specimen Description FLUID RIGHT PLEURAL  Final   Special Requests NONE  Final   Culture NO GROWTH 5 DAYS  Final   Report Status 10/22/2015 FINAL  Final  Gram stain     Status: None   Collection Time: 10/24/2015  8:42 AM  Result  Value Ref Range Status   Specimen Description FLUID RIGHT PLEURAL  Final   Special Requests NONE  Final   Gram Stain   Final    CYTOSPIN SMEAR WBC PRESENT,BOTH PMN AND MONONUCLEAR NO ORGANISMS SEEN    Report Status 10/09/2015 FINAL  Final  Aerobic/Anaerobic Culture (surgical/deep wound)     Status: None   Collection Time: 10/23/2015  8:49 AM  Result Value Ref Range Status   Specimen Description BIOPSY RIGHT PLEURAL  Final   Special Requests PATIENT ON FOLLOWING  ZYNACEF SPECIMEN B  Final   Gram Stain   Final    FEW WBC PRESENT,BOTH PMN AND MONONUCLEAR NO ORGANISMS SEEN    Culture NO GROWTH 5 DAYS  Final   Report Status 10/22/2015 FINAL  Final       Inpatient Medications:   Scheduled Meds: . ALPRAZolam  0.5 mg Oral TID  . aspirin  81 mg Oral Daily  . bisacodyl  10 mg Oral Daily  . chlorhexidine  15 mL Mouth Rinse BID  . cloNIDine  0.1 mg Oral TID  . enoxaparin (LOVENOX) injection  40 mg Subcutaneous Q24H  . famotidine  20 mg Oral Daily  . feeding supplement (PRO-STAT SUGAR FREE 64)  30 mL Per Tube BID  . fluticasone furoate-vilanterol  1 puff Inhalation Daily  . furosemide  20 mg Intravenous Once  . levalbuterol  0.63 mg Nebulization Q6H  . multivitamin with minerals  1 tablet Oral Daily  . potassium chloride  40 mEq Per J Tube Once  . simethicone  80 mg Oral QID  . sodium chloride flush  10-40 mL Intracatheter Q12H  . sodium chloride flush  3 mL Intravenous Q12H  . sodium chloride flush  3 mL Intravenous Q12H   Continuous Infusions: . dextrose 5 % and 0.9% NaCl 50 mL/hr at 10/24/15 1800  . feeding supplement (VITAL AF 1.2 CAL) Stopped (10/25/15 1300)     Radiological Exams on Admission: Dg Chest Port 1 View  Result Date: 10/25/2015 CLINICAL DATA:  Chest tube in place EXAM: PORTABLE CHEST 1 VIEW COMPARISON:  October 24, 2015 FINDINGS: A right-sided chest tube remains in place. A small pneumothorax remains in the right base, unchanged. Increased interstitial markings in  the right lung versus the left are mildly more prominent in the interval, possibly due to technique. A layering effusion and underlying atelectasis on the left remain. Support apparatus is stable. IMPRESSION: 1. Stable support apparatus including a right chest tube. Lucency in the right costophrenic angle consistent with persistent but stable basilar pneumothorax. 2. Increased interstitial markings throughout the right lung versus the left are mildly more prominent in the interval, possibly technical in nature. 3. Small left effusion and underlying atelectasis. Electronically Signed   By: Dorise Bullion III M.D   On: 10/25/2015 08:52  Dg Chest Port 1 View  Result Date: 10/24/2015 CLINICAL DATA:  Pneumothorax.  Chest tube . EXAM: PORTABLE CHEST 1 VIEW COMPARISON:  10/23/2015. FINDINGS: Lines and tubes including right chest tube in stable position. Stable small  right lung base pneumothorax. Heart size stable. Persistent mild interstitial prominence noted a right lung. Persistent small bilateral pleural effusions. No interim change. Mild right chest wall subcutaneous emphysema . IMPRESSION: 1. Lines and tubes including right chest tube in stable position. Stable right basilar tiny pneumothorax. Mild right chest wall subcutaneous emphysema. 2. Persistent mild interstitial prominence right lung. Persistent small bilateral pleural effusions. Electronically Signed   By: Marcello Moores  Register   On: 10/24/2015 07:19  Dg Abd Portable 1v  Result Date: 10/24/2015 CLINICAL DATA:  Current history of pancreatitis for which the patient is receiving jejunal tube feedings. The patient is currently not tolerating the tube feedings very well and has not had a bowel movement in approximately 36 hr. EXAM: PORTABLE ABDOMEN - 1 VIEW COMPARISON:  Abdomen x-ray 10/21/2015, 10/18/2015. CT abdomen and pelvis 10/19/2015. FINDINGS: Bowel gas pattern unremarkable without evidence of obstruction or significant ileus. Feeding tube tip not  included on the image but the tube courses through the stomach and duodenum into the proximal jejunum. Surgical clips in the right upper quadrant from prior cholecystectomy. No visible opaque urinary tract calculi. IMPRESSION: No acute abdominal abnormality. Electronically Signed   By: Evangeline Dakin M.D.   On: 10/24/2015 11:25  Echo 09/16/15 Study Conclusions  - Left ventricle: The cavity size was normal. Wall thickness was   increased in a pattern of mild LVH. Systolic function was   vigorous. The estimated ejection fraction was in the range of 65%   to 70%. Wall motion was normal; there were no regional wall   motion abnormalities. Doppler parameters are consistent with   abnormal left ventricular relaxation (grade 1 diastolic   dysfunction). - Aortic valve: Poorly visualized. There was no stenosis. - Mitral valve: There was no significant regurgitation. - Right ventricle: The cavity size was normal. Systolic function   was normal. - Tricuspid valve: Peak RV-RA gradient (S): 21 mm Hg. - Pulmonary arteries: PA peak pressure: 24 mm Hg (S). - Inferior vena cava: The vessel was normal in size. The   respirophasic diameter changes were in the normal range (>= 50%),   consistent with normal central venous pressure.  Impressions:  - Normal LV size with mild LV hypertrophy, EF 65-70%. Normal RV   size and systolic function. No significant valvular   abnormalities.   Impression/Recommendations Active Problems:   Pleural effusion, right   Pleural effusion associated with pancreatitis   Pneumothorax   Acute respiratory failure with hypoxia (H[facility])   Abdominal discomfort   LUQ pain   Protein-calorie malnutrition ([facility])   Pancreatitis   SOB (shortness of breath)  1. bilat le edema /vol overload - could be multifactorial including hypoalbuminemia, as well as up 4.6 L since admission on 7/17., doubt PE given symmetry.  - lasix 20mg  iv x 1 now, replete k, watch tachycardia  - hliv for  now (d5 ns @ 50, currently npo, will hold this til MN, than resume), dw rn. 2. Hypokalemia - could be related to poor po intake.  - replete w/ kcl 31meq pt x 1 now  - rechk k in 4 hrs 3. Tachycardia - felt multifactorial, including anxiety, as well as signs of macrocytic anemia.  - telemonitoring  - will dc clonidine and switch back to pt's bb, which would help w/ this.  - chk tsh  4. Pancreatitis, w/ peripancreatic fluid collections.  - defer to GI, considering MRI abd/EUS - postpyloric feedings, defer to GI  5. Htn, controlled, w/ hx of CAD -  dc clonidine, add metoprolol tartrate 25 bid for now, adjust as needed, will help w/ tachycardia  as well. - no cp.  6. Anxiety and depression - has xanex prn, consider longacting ssri, defer to psychiatry, which was consulted today as well.  7.  Macrocytic anemia - added b12 /folate to labs, iron studies. - may be contributing to tachycardia as well  8.  recurrent right exudative pleural effusion (felt 2nd to pancreatitis per Pulm),   - s/p video-assisted thoracostomy with chest tube placement on 10/05/2015, - defer to CT surg, CTube remains in place.  9. 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  10. Ongoing tob abuse - smokes when not in hospital per pt.  11. Mod protein cal malnutrition - on TFs per GI.  Thank you for this consultation.  Our [facility] hospitalist team will follow the patient with you.   Time Spent: 46mins  Maren Reamer M.D. [facility] 10/25/2015, 3:21 PM

## 2015-10-25 NOTE — Progress Notes (Signed)
[id number]       [place] [address]             [phone]      8 Days Post-Op Procedure(s) (LRB): VIDEO ASSISTED THORACOSCOPY (VATS) with Drainage of Pleural Effusion (Right) DRAINAGE OF PLEURAL EFFUSION (Right) Subjective: Feels poorly, + abdominal discomfort  Objective: Vital signs in last 24 hours: Temp:  [97.7 F (36.5 C)-98.4 F (36.9 C)] 97.7 F (36.5 C) (07/29 0723) Pulse Rate:  [111-121] 117 (07/29 0723) Cardiac Rhythm: Sinus tachycardia (07/29 0745) Resp:  [23-36] 35 (07/29 0723) BP: (97-131)/(53-84) 131/55 (07/29 0723) SpO2:  [94 %-99 %] 99 % (07/29 0756) Weight:  [161 lb 2.5 oz (73.1 kg)] 161 lb 2.5 oz (73.1 kg) (07/29 0410)  Hemodynamic parameters for last 24 hours:    Intake/Output from previous day: 07/28 0701 - 07/29 0700 In: 2174 [P.O.:25; I.V.:1251.7; NG/GT:897.3] Out: 360 [Urine:100; Chest Tube:260] Intake/Output this shift: Total I/O In: 30 [NG/GT:30] Out: 250 [Urine:250]  General appearance: alert, cooperative, fatigued and no distress Heart: regular rate and rhythm Lungs: coarse BS Abdomen: + TTP LUQ Extremities: minor edema Wound: ok  Lab Results: No results for input(s): WBC, HGB, HCT, PLT in the last 72 hours. BMET: No results for input(s): NA, K, CL, CO2, GLUCOSE, BUN, CREATININE, CALCIUM in the last 72 hours.  PT/INR: No results for input(s): LABPROT, INR in the last 72 hours. ABG    Component Value Date/Time   PHART 7.473 (H) 10/05/2015 2223   HCO3 26.7 (H) 10/05/2015 2223   TCO2 24.2 10/05/2015 2223   O2SAT 92.2 10/05/2015 2223   CBG (last 3)   Recent Labs  10/24/15 2346 10/25/15 0408 10/25/15 0719  GLUCAP 147* 143* 156*    Meds Scheduled Meds: . ALPRAZolam  0.5 mg Oral TID  . aspirin  81 mg Oral Daily  . bisacodyl  10 mg Oral Daily  . chlorhexidine  15 mL Mouth Rinse BID  . cloNIDine  0.1 mg Oral TID  . enoxaparin (LOVENOX) injection  40 mg Subcutaneous Q24H  . famotidine  20 mg Oral  Daily  . feeding supplement (PRO-STAT SUGAR FREE 64)  30 mL Per Tube BID  . fluticasone furoate-vilanterol  1 puff Inhalation Daily  . levalbuterol  0.63 mg Nebulization Q6H  . multivitamin with minerals  1 tablet Oral Daily  . simethicone  80 mg Oral QID  . sodium chloride flush  10-40 mL Intracatheter Q12H  . sodium chloride flush  3 mL Intravenous Q12H  . sodium chloride flush  3 mL Intravenous Q12H   Continuous Infusions: . dextrose 5 % and 0.9% NaCl 50 mL/hr at 10/24/15 1800  . feeding supplement (VITAL AF 1.2 CAL) 1,000 mL (10/24/15 1037)   PRN Meds:.acetaminophen **OR** acetaminophen, alum & mag hydroxide-simeth, fentaNYL (SUBLIMAZE) injection, ondansetron **OR** ondansetron (ZOFRAN) IV, phenol, polyethylene glycol, potassium chloride, sodium chloride flush, traMADol   Results for orders placed or performed during the hospital encounter of 10/25/2015  MRSA PCR Screening     Status: None   Collection Time: 10/16/15 11:24 PM  Result Value Ref Range Status   MRSA by PCR NEGATIVE NEGATIVE Final    Comment:        The GeneXpert MRSA Assay (FDA approved for NASAL specimens only), is one component of a comprehensive MRSA colonization surveillance program. It is not intended to diagnose MRSA infection nor to guide or monitor treatment for MRSA infections.   Culture, body fluid-bottle  Status: None   Collection Time: 10/20/2015  8:42 AM  Result Value Ref Range Status   Specimen Description FLUID RIGHT PLEURAL  Final   Special Requests NONE  Final   Culture NO GROWTH 5 DAYS  Final   Report Status 10/22/2015 FINAL  Final  Gram stain     Status: None   Collection Time: 10/22/2015  8:42 AM  Result Value Ref Range Status   Specimen Description FLUID RIGHT PLEURAL  Final   Special Requests NONE  Final   Gram Stain   Final    CYTOSPIN SMEAR WBC PRESENT,BOTH PMN AND MONONUCLEAR NO ORGANISMS SEEN    Report Status 10/11/2015 FINAL  Final  Aerobic/Anaerobic Culture (surgical/deep  wound)     Status: None   Collection Time: 10/20/2015  8:49 AM  Result Value Ref Range Status   Specimen Description BIOPSY RIGHT PLEURAL  Final   Special Requests PATIENT ON FOLLOWING  ZYNACEF SPECIMEN B  Final   Gram Stain   Final    FEW WBC PRESENT,BOTH PMN AND MONONUCLEAR NO ORGANISMS SEEN    Culture NO GROWTH 5 DAYS  Final   Report Status 10/22/2015 FINAL  Final    Xrays Dg Chest Port 1 View  Result Date: 10/25/2015 CLINICAL DATA:  Chest tube in place EXAM: PORTABLE CHEST 1 VIEW COMPARISON:  October 24, 2015 FINDINGS: A right-sided chest tube remains in place. A small pneumothorax remains in the right base, unchanged. Increased interstitial markings in the right lung versus the left are mildly more prominent in the interval, possibly due to technique. A layering effusion and underlying atelectasis on the left remain. Support apparatus is stable. IMPRESSION: 1. Stable support apparatus including a right chest tube. Lucency in the right costophrenic angle consistent with persistent but stable basilar pneumothorax. 2. Increased interstitial markings throughout the right lung versus the left are mildly more prominent in the interval, possibly technical in nature. 3. Small left effusion and underlying atelectasis. Electronically Signed   By: Dorise Bullion III M.D   On: 10/25/2015 08:52  Dg Chest Port 1 View  Result Date: 10/24/2015 CLINICAL DATA:  Pneumothorax.  Chest tube . EXAM: PORTABLE CHEST 1 VIEW COMPARISON:  10/23/2015. FINDINGS: Lines and tubes including right chest tube in stable position. Stable small right lung base pneumothorax. Heart size stable. Persistent mild interstitial prominence noted a right lung. Persistent small bilateral pleural effusions. No interim change. Mild right chest wall subcutaneous emphysema . IMPRESSION: 1. Lines and tubes including right chest tube in stable position. Stable right basilar tiny pneumothorax. Mild right chest wall subcutaneous emphysema. 2.  Persistent mild interstitial prominence right lung. Persistent small bilateral pleural effusions. Electronically Signed   By: Marcello Moores  Register   On: 10/24/2015 07:19  Dg Abd Portable 1v  Result Date: 10/24/2015 CLINICAL DATA:  Current history of pancreatitis for which the patient is receiving jejunal tube feedings. The patient is currently not tolerating the tube feedings very well and has not had a bowel movement in approximately 36 hr. EXAM: PORTABLE ABDOMEN - 1 VIEW COMPARISON:  Abdomen x-ray 10/21/2015, 10/18/2015. CT abdomen and pelvis 10/19/2015. FINDINGS: Bowel gas pattern unremarkable without evidence of obstruction or significant ileus. Feeding tube tip not included on the image but the tube courses through the stomach and duodenum into the proximal jejunum. Surgical clips in the right upper quadrant from prior cholecystectomy. No visible opaque urinary tract calculi. IMPRESSION: No acute abdominal abnormality. Electronically Signed   By: Evangeline Dakin M.D.   On: 10/24/2015  11:25   Assessment/Plan: S/P Procedure(s) (LRB): VIDEO ASSISTED THORACOSCOPY (VATS) with Drainage of Pleural Effusion (Right) DRAINAGE OF PLEURAL EFFUSION (Right)  1 chest tube + air leak-continue tube, cultures are negative so far 2 cont pulm toilet/nebs 3 abdomen - pancreas issues being adressed per GI  Consultant   LOS: 12 days    Geonna Lockyer E 10/25/2015

## 2015-10-26 ENCOUNTER — Inpatient Hospital Stay (HOSPITAL_COMMUNITY): Payer: Medicare Other

## 2015-10-26 DIAGNOSIS — K529 Noninfective gastroenteritis and colitis, unspecified: Secondary | ICD-10-CM

## 2015-10-26 DIAGNOSIS — I776 Arteritis, unspecified: Secondary | ICD-10-CM

## 2015-10-26 DIAGNOSIS — F419 Anxiety disorder, unspecified: Secondary | ICD-10-CM

## 2015-10-26 DIAGNOSIS — I2510 Atherosclerotic heart disease of native coronary artery without angina pectoris: Secondary | ICD-10-CM

## 2015-10-26 DIAGNOSIS — F329 Major depressive disorder, single episode, unspecified: Secondary | ICD-10-CM

## 2015-10-26 DIAGNOSIS — F411 Generalized anxiety disorder: Secondary | ICD-10-CM

## 2015-10-26 DIAGNOSIS — R000 Tachycardia, unspecified: Secondary | ICD-10-CM

## 2015-10-26 LAB — BASIC METABOLIC PANEL
Anion gap: 5 (ref 5–15)
BUN: 13 mg/dL (ref 6–20)
CHLORIDE: 102 mmol/L (ref 101–111)
CO2: 30 mmol/L (ref 22–32)
Calcium: 8.4 mg/dL — ABNORMAL LOW (ref 8.9–10.3)
Creatinine, Ser: 0.38 mg/dL — ABNORMAL LOW (ref 0.44–1.00)
GFR calc Af Amer: >60 mL/min (ref >60)
GFR calc non Af Amer: >60 mL/min (ref >60)
Glucose, Bld: 107 mg/dL — ABNORMAL HIGH (ref 65–99)
POTASSIUM: 3.4 mmol/L — AB (ref 3.5–5.1)
SODIUM: 137 mmol/L (ref 135–145)

## 2015-10-26 LAB — CBC WITH DIFFERENTIAL/PLATELET
Basophils Absolute: 0 10*3/uL (ref 0.0–0.1)
Basophils Relative: 0 %
EOS ABS: 0.6 10*3/uL (ref 0.0–0.7)
Eosinophils Relative: 5 %
HEMATOCRIT: 29.3 % — AB (ref 36.0–46.0)
HEMOGLOBIN: 8.8 g/dL — AB (ref 12.0–15.0)
LYMPHS ABS: 2.2 10*3/uL (ref 0.7–4.0)
LYMPHS PCT: 19 %
MCH: 30.8 pg (ref 26.0–34.0)
MCHC: 30 g/dL (ref 30.0–36.0)
MCV: 102.4 fL — AB (ref 78.0–100.0)
MONOS PCT: 12 %
Monocytes Absolute: 1.4 10*3/uL — ABNORMAL HIGH (ref 0.1–1.0)
NEUTROS ABS: 7.3 10*3/uL (ref 1.7–7.7)
NEUTROS PCT: 64 %
Platelets: 255 10*3/uL (ref 150–400)
RBC: 2.86 MIL/uL — AB (ref 3.87–5.11)
RDW: 16.6 % — ABNORMAL HIGH (ref 11.5–15.5)
WBC: 11.4 10*3/uL — AB (ref 4.0–10.5)

## 2015-10-26 LAB — GLUCOSE, CAPILLARY
GLUCOSE-CAPILLARY: 117 mg/dL — AB (ref 65–99)
GLUCOSE-CAPILLARY: 127 mg/dL — AB (ref 65–99)
Glucose-Capillary: 118 mg/dL — ABNORMAL HIGH (ref 65–99)
Glucose-Capillary: 113 mg/dL — ABNORMAL HIGH (ref 65–99)
Glucose-Capillary: 130 mg/dL — ABNORMAL HIGH (ref 65–99)
Glucose-Capillary: 159 mg/dL — ABNORMAL HIGH (ref 65–99)

## 2015-10-26 MED ORDER — ONDANSETRON HCL 4 MG/2ML IJ SOLN
4.0000 mg | Freq: Four times a day (QID) | INTRAMUSCULAR | Status: DC | PRN
  Administered 2015-10-26 – 2015-10-28 (×2): 4 mg via INTRAVENOUS

## 2015-10-26 MED ORDER — METOPROLOL TARTRATE 5 MG/5ML IV SOLN
5.0000 mg | Freq: Four times a day (QID) | INTRAVENOUS | Status: DC
  Administered 2015-10-26 – 2015-10-30 (×15): 5 mg via INTRAVENOUS
  Filled 2015-10-26 (×15): qty 5

## 2015-10-26 MED ORDER — DIPHENHYDRAMINE HCL 50 MG/ML IJ SOLN
12.5000 mg | Freq: Four times a day (QID) | INTRAMUSCULAR | Status: DC | PRN
  Administered 2015-10-29: 12.5 mg via INTRAVENOUS
  Filled 2015-10-26: qty 1

## 2015-10-26 MED ORDER — FENTANYL 40 MCG/ML IV SOLN
INTRAVENOUS | Status: DC
  Administered 2015-10-26: 12:00:00 via INTRAVENOUS
  Administered 2015-10-26: 240 ug via INTRAVENOUS
  Administered 2015-10-26: 120 ug via INTRAVENOUS
  Administered 2015-10-27: 60 ug via INTRAVENOUS
  Administered 2015-10-27: 165 ug via INTRAVENOUS
  Administered 2015-10-27: 08:00:00 via INTRAVENOUS
  Administered 2015-10-27: 270 ug via INTRAVENOUS
  Administered 2015-10-27: 150 ug via INTRAVENOUS
  Administered 2015-10-27: 180 ug via INTRAVENOUS
  Administered 2015-10-27: 150 ug via INTRAVENOUS
  Administered 2015-10-28: 135 ug via INTRAVENOUS
  Administered 2015-10-28 (×2): 15 ug via INTRAVENOUS
  Administered 2015-10-28 – 2015-10-29 (×2): 105 ug via INTRAVENOUS
  Filled 2015-10-26 (×3): qty 25

## 2015-10-26 MED ORDER — NICOTINE 21 MG/24HR TD PT24
21.0000 mg | MEDICATED_PATCH | Freq: Every day | TRANSDERMAL | Status: DC
  Administered 2015-10-27: 21 mg via TRANSDERMAL
  Filled 2015-10-26 (×6): qty 1

## 2015-10-26 MED ORDER — POTASSIUM CHLORIDE 20 MEQ PO PACK: 40.0000 meq | PACK | Freq: Once | ORAL | Status: DC

## 2015-10-26 MED ORDER — SERTRALINE HCL 25 MG PO TABS
25.0000 mg | ORAL_TABLET | Freq: Every day | ORAL | Status: DC
  Administered 2015-10-26 – 2015-10-28 (×3): 25 mg via ORAL
  Filled 2015-10-26 (×3): qty 1

## 2015-10-26 MED ORDER — LORAZEPAM 2 MG/ML IJ SOLN
0.5000 mg | Freq: Once | INTRAMUSCULAR | Status: AC
  Administered 2015-10-26: 0.5 mg via INTRAVENOUS
  Filled 2015-10-26: qty 1

## 2015-10-26 MED ORDER — NALOXONE HCL 0.4 MG/ML IJ SOLN: 0.4000 mg | INTRAMUSCULAR | Status: DC | PRN

## 2015-10-26 MED ORDER — DIPHENHYDRAMINE HCL 12.5 MG/5ML PO ELIX: 12.5000 mg | ORAL_SOLUTION | Freq: Four times a day (QID) | ORAL | Status: DC | PRN

## 2015-10-26 MED ORDER — SODIUM CHLORIDE 0.9% FLUSH: 9.0000 mL | INTRAVENOUS | Status: DC | PRN

## 2015-10-26 MED ORDER — LORAZEPAM 2 MG/ML IJ SOLN: 0.5000 mg | Freq: Two times a day (BID) | INTRAMUSCULAR | Status: DC

## 2015-10-26 MED ORDER — ALPRAZOLAM 0.5 MG PO TABS: 0.5000 mg | ORAL_TABLET | Freq: Three times a day (TID) | ORAL | Status: DC | PRN

## 2015-10-26 MED ORDER — BUDESONIDE 0.25 MG/2ML IN SUSP
0.2500 mg | Freq: Two times a day (BID) | RESPIRATORY_TRACT | Status: DC
  Administered 2015-10-27 – 2015-10-29 (×5): 0.25 mg via RESPIRATORY_TRACT
  Filled 2015-10-26 (×5): qty 2

## 2015-10-26 MED ORDER — LORAZEPAM 2 MG/ML IJ SOLN
1.0000 mg | Freq: Two times a day (BID) | INTRAMUSCULAR | Status: DC
  Administered 2015-10-26: 1 mg via INTRAVENOUS
  Filled 2015-10-26: qty 1

## 2015-10-26 MED ORDER — POTASSIUM CHLORIDE 20 MEQ/15ML (10%) PO SOLN
40.0000 meq | Freq: Once | ORAL | Status: AC
  Administered 2015-10-26: 40 meq
  Filled 2015-10-26: qty 30

## 2015-10-26 NOTE — Progress Notes (Signed)
Patient ambulated approximately 100 feet using front wheel walker on 2L oxygen.  Patient tolerated ambulation well, however, it caused severe pain.  Emotional support provided during ambulation. Will continue to monitor patient.

## 2015-10-26 NOTE — Consult Note (Addendum)
[facility] Face-to-Face Psychiatry Consult   Reason for Consult:  Anxiety, depression  Referring Physician:   Dr. Rhea Belton  Patient Identification: [name] [name] MRN:  [id number] Principal Diagnosis: Anxiety  Diagnosis:   Patient Active Problem List   Diagnosis Date Noted  . SOB (shortness of breath) [R06.02]   . Pancreatitis [K85.9]   . LUQ pain [R10.12]   . Protein-calorie malnutrition (HCC) [E46]   . Abdominal discomfort [R10.9]   . Pneumothorax [J93.9]   . Acute respiratory failure with hypoxia (HCC) [J96.01]   . Pleural effusion associated with pancreatitis [J90, K85.90] 10/15/2015  . Pleural effusion, right [J94.8] 10/03/2015  . Cavitating mass in right lower lung lobe [J98.4] 10/03/2015  . 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 Acute on chronic respiratory failure with hypoxia (HCC) [J96.21] 09/28/2015  . Bilateral pleural effusion [J90] 09/28/2015  . UTI (lower urinary tract infection) [N39.0] 09/28/2015  . Other acute pancreatitis [K85.8] 09/23/2015  . Peripancreatic fluid collection [facility]) [K86.89] 09/23/2015  . Back pain [M54.9] 08/08/2015  . 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 Lung mass [R91.8] 06/25/2015  . Tobacco user [Z72.0] 06/25/2015  . Other mixed anxiety disorders [F41.3] 06/16/2015  . Osteopenia [M85.80] 10/07/2014  . Esophageal dysphagia [R13.14] 09/24/2014  . Hx of adenomatous polyp of colon [Z86.010] 07/25/2014  . 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 Benign head tremor [G25.0] 05/07/2014  . Chronic diarrhea- suspect post-cholecystectomy [K52.9] 02/18/2014  . 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 Hypokalemia [E87.6]   . Hyperglycemia [R73.9]   . Depression [F32.9] 01/29/2012  . Dyslipidemia [E78.5] 08/04/2011  . CAD (coronary artery disease) [I25.10] 08/04/2011  . History of tobacco abuse  [Z87.891] 08/04/2011  . Grief reaction [F43.21]   . Asthma [J45.909] 05/21/2010  . Allergic rhinitis [J30.9] 05/17/2010  . Hereditary hemochromatosis (HCC) [E83.110] 05/15/2010  . Essential hypertension [I10] 05/15/2010  . GERD [K21.9] 05/15/2010    Total Time spent with patient: 30 minutes  Subjective:   [name] [name] is a 70 y.o. female patient admitted with  Dyspnea.   HPI: 70 year old female , presented to the hospital for dyspnea. She has a R hydropneumothorax, and has a history of recent admission due to acute pancreatitis with peripancreatic fluid collections . Etiology unclear at this time. Patient has been reporting significant abdominal pain. Patient is currently drowsy, and provides only limited information at the present time.  As discussed with RN staff, she has been significantly anxious, agitated  at times, resulting in difficulty with procedures and daily care. She has been ruminating about her physical illness, condition, and worrying if she may be dying .   At this time patient sates she is feeling better , and states that the medication she got today has helped her feel better, calmer. At this time denies severe depression, reports improvement today compared to how she felt yesterday,. No current agitation or distress .    Past Psychiatric History: Patient denies psychiatric history, denies any history of self injurious or suicidal attempts.Family members, who were visiting, reported that patient has no prior psychiatric history other than depression when her husband passed away 5 years ago. At that time was treated with medication ( name?) but was stopped due to cognitive side effects   Patient denies any history of drug or alcohol abuse   Risk to Self: Is patient at risk for suicide?: No Risk to Others:  No  Prior Inpatient Therapy:  No  Prior Outpatient Therapy:  yes   Past Medical History:  Past Medical History:  Diagnosis Date  . Anxiety   . Arthritis     "all over me, I guess" (09/28/2015)  . ASTHMA   . 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 CAD (coronary artery disease)   . 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 Chronic kidney disease   . COPD (chronic obstructive pulmonary disease) with emphysema ([place])    mild dz on PFTs 11/2013  . Depression   . GERD   . 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 H/O hiatal hernia   . Hereditary hemochromatosis ([place]) 2004  . Hx of adenomatous polyp of colon 07/25/2014  . Hyperlipidemia   . HYPERTENSION   . Hypertension   . Migraine    "hx; not anymore" (10/24/2015)  . On home oxygen therapy    "2L; 24/7" (10/04/2015)  . Osteopenia 10/07/2014   DEXA @ [facility] 09/2014: -1.9 L fem  . PONV (postoperative nausea and vomiting)   . Sinus headache   . Urinary tract infection    "recurrent sometimes" (10/26/2015)    Past Surgical History:  Procedure Laterality Date  . ABDOMINAL HYSTERECTOMY  ~ 1983  . APPENDECTOMY  ~ 1983  . BLADDER SUSPENSION  03/29/2002  . COLONOSCOPY W/ BIOPSIES    . CYSTECTOMY     cyst removed from intestines to ovary    . CYSTOSCOPY  01/24/14   NEGATIVE;Dr Ottelin  . DILATION AND CURETTAGE OF UTERUS    . ESOPHAGOGASTRODUODENOSCOPY    . LAPAROSCOPIC CHOLECYSTECTOMY  ~ 2011  . PLEURAL EFFUSION DRAINAGE Right 10/21/2015   Procedure: DRAINAGE OF PLEURAL EFFUSION;  Surgeon: Gaye Pollack, MD;  Location: [facility] OR;  Service: Thoracic;  Laterality: Right;  . TENDON RELEASE Left    wrist   . VIDEO ASSISTED THORACOSCOPY (VATS)/THOROCOTOMY Right 10/13/2015   Procedure: VIDEO ASSISTED THORACOSCOPY (VATS) with Drainage of Pleural Effusion;  Surgeon: Gaye Pollack, MD;  Location: [facility] OR;  Service: Thoracic;  Laterality: Right;   Family History:  Family History  Problem Relation Age of Onset  . Hypertension Mother   . Heart disease Mother   . Breast cancer Mother 72  . Diabetes Father   . Heart disease Father   . Alcohol abuse Other   .  Arthritis Other   . Cirrhosis Brother   . Cirrhosis Brother   . Cirrhosis Brother   . Lung cancer Brother   . Colon cancer Neg Hx   . Colon polyps Neg Hx   . Esophageal cancer Neg Hx   . Rectal cancer Neg Hx   . Stomach cancer Neg Hx    Family Psychiatric  History: no contributory information at this time Social History:  History  Alcohol Use No     History  Drug Use No    Social History   Social History  . Marital status: Widowed    Spouse name: N/A  . Number of children: 2  . Years of education: N/A   Occupational History  . 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    Social History Main Topics  . Smoking status: Former Smoker    Packs/day: 0.50    Years: 54.00    Types: Cigarettes    Quit date: 08/28/2015  . Smokeless tobacco: Never Used  . Alcohol use No  . Drug use: No  .  Sexual activity: No   Other Topics Concern  . None   Social History Narrative   widowed summer 2012, lives alone. Retired from work in [employer] Social History:    Allergies:   Allergies  Allergen Reactions  . Other Other (See Comments)    Please if giving pt anesthesia - she would like to have a scopolamine patch to prevent nausea   . Ciprofloxacin Hives and Other (See Comments)    Blisters on legs  . Codeine Nausea And Vomiting and Other (See Comments)    Sweating, "passes out"  . Macrobid [employer] Macro] Other (See Comments)    Stool incontinence  . Morphine And Related Other (See Comments)    "hospital bed was shaking"  . Prevnar [Pneumococcal 13-Val Conj Vacc] Palpitations and Other (See Comments)    Pt c/o redness, swelling on arm after shot.  Pt c/o having respiratory problems and coughing ever since she got it.     Labs:  Results for orders placed or performed during the hospital encounter of 09/29/2015 (from the past 48 hour(s))  Glucose, capillary     Status: Abnormal   Collection Time: 10/24/15  3:58 PM   Result Value Ref Range   Glucose-Capillary 129 (H) 65 - 99 mg/dL   Comment 1 Notify RN    Comment 2 Document in Chart   Glucose, capillary     Status: Abnormal   Collection Time: 10/24/15  7:15 PM  Result Value Ref Range   Glucose-Capillary 135 (H) 65 - 99 mg/dL   Comment 1 Notify RN   Glucose, capillary     Status: Abnormal   Collection Time: 10/24/15 11:46 PM  Result Value Ref Range   Glucose-Capillary 147 (H) 65 - 99 mg/dL   Comment 1 Notify RN   Glucose, capillary     Status: Abnormal   Collection Time: 10/25/15  4:08 AM  Result Value Ref Range   Glucose-Capillary 143 (H) 65 - 99 mg/dL   Comment 1 Notify RN   Glucose, capillary     Status: Abnormal   Collection Time: 10/25/15  7:19 AM  Result Value Ref Range   Glucose-Capillary 156 (H) 65 - 99 mg/dL  Glucose, capillary     Status: Abnormal   Collection Time: 10/25/15 11:26 AM  Result Value Ref Range   Glucose-Capillary 152 (H) 65 - 99 mg/dL  CBC     Status: Abnormal   Collection Time: 10/25/15  1:20 PM  Result Value Ref Range   WBC 10.6 (H) 4.0 - 10.5 K/uL   RBC 2.80 (L) 3.87 - 5.11 MIL/uL   Hemoglobin 8.6 (L) 12.0 - 15.0 g/dL   HCT 20.7 (L) 78.2 - 49.8 %   MCV 100.7 (H) 78.0 - 100.0 fL   MCH 30.7 26.0 - 34.0 pg   MCHC 30.5 30.0 - 36.0 g/dL   RDW 22.0 (H) 74.3 - 09.2 %   Platelets 241 150 - 400 K/uL  Comprehensive metabolic panel     Status: Abnormal   Collection Time: 10/25/15  1:20 PM  Result Value Ref Range   Sodium 138 135 - 145 mmol/L   Potassium 2.9 (L) 3.5 - 5.1 mmol/L   Chloride 104 101 - 111 mmol/L   CO2 27 22 - 32 mmol/L   Glucose, Bld 124 (H) 65 - 99 mg/dL   BUN 11 6 - 20 mg/dL   Creatinine, Ser 4.44 (L) 0.44 - 1.00 mg/dL   Calcium 8.0 (L) 8.9 -  10.3 mg/dL   Total Protein 4.2 (L) 6.5 - 8.1 g/dL   Albumin 1.8 (L) 3.5 - 5.0 g/dL   AST 14 (L) 15 - 41 U/L   ALT 15 14 - 54 U/L   Alkaline Phosphatase 78 38 - 126 U/L   Total Bilirubin 0.5 0.3 - 1.2 mg/dL   GFR calc non Af Amer >60 >60 mL/min   GFR  calc Af Amer >60 >60 mL/min    Comment: (NOTE) The eGFR has been calculated using the CKD EPI equation. This calculation has not been validated in all clinical situations. eGFR's persistently <60 mL/min signify possible Chronic Kidney Disease.    Anion gap 7 5 - 15  Magnesium     Status: None   Collection Time: 10/25/15  3:43 PM  Result Value Ref Range   Magnesium 1.9 1.7 - 2.4 mg/dL  Iron and TIBC     Status: Abnormal   Collection Time: 10/25/15  3:43 PM  Result Value Ref Range   Iron 25 (L) 28 - 170 ug/dL   TIBC 154 (L) 250 - 450 ug/dL   Saturation Ratios 16 10.4 - 31.8 %   UIBC 129 ug/dL  Glucose, capillary     Status: Abnormal   Collection Time: 10/25/15  4:07 PM  Result Value Ref Range   Glucose-Capillary 135 (H) 65 - 99 mg/dL   Comment 1 Notify RN    Comment 2 Document in Chart   TSH     Status: None   Collection Time: 10/25/15  4:25 PM  Result Value Ref Range   TSH 2.781 0.350 - 4.500 uIU/mL  Vitamin B12     Status: None   Collection Time: 10/25/15  4:25 PM  Result Value Ref Range   Vitamin B-12 465 180 - 914 pg/mL    Comment: (NOTE) This assay is not validated for testing neonatal or myeloproliferative syndrome specimens for Vitamin B12 levels.   Ferritin     Status: None   Collection Time: 10/25/15  4:25 PM  Result Value Ref Range   Ferritin 217 11 - 307 ng/mL  Glucose, capillary     Status: Abnormal   Collection Time: 10/25/15  7:14 PM  Result Value Ref Range   Glucose-Capillary 114 (H) 65 - 99 mg/dL   Comment 1 Notify RN   Potassium     Status: Abnormal   Collection Time: 10/25/15  7:58 PM  Result Value Ref Range   Potassium 3.4 (L) 3.5 - 5.1 mmol/L  Glucose, capillary     Status: Abnormal   Collection Time: 10/25/15 10:59 PM  Result Value Ref Range   Glucose-Capillary 135 (H) 65 - 99 mg/dL   Comment 1 Notify RN   Glucose, capillary     Status: Abnormal   Collection Time: 10/26/15  3:47 AM  Result Value Ref Range   Glucose-Capillary 117 (H) 65 - 99  mg/dL   Comment 1 Notify RN   CBC with Differential/Platelet     Status: Abnormal   Collection Time: 10/26/15  3:48 AM  Result Value Ref Range   WBC 11.4 (H) 4.0 - 10.5 K/uL   RBC 2.86 (L) 3.87 - 5.11 MIL/uL   Hemoglobin 8.8 (L) 12.0 - 15.0 g/dL   HCT 29.3 (L) 36.0 - 46.0 %   MCV 102.4 (H) 78.0 - 100.0 fL   MCH 30.8 26.0 - 34.0 pg   MCHC 30.0 30.0 - 36.0 g/dL   RDW 16.6 (H) 11.5 - 15.5 %   Platelets  255 150 - 400 K/uL   Neutrophils Relative % 64 %   Neutro Abs 7.3 1.7 - 7.7 K/uL   Lymphocytes Relative 19 %   Lymphs Abs 2.2 0.7 - 4.0 K/uL   Monocytes Relative 12 %   Monocytes Absolute 1.4 (H) 0.1 - 1.0 K/uL   Eosinophils Relative 5 %   Eosinophils Absolute 0.6 0.0 - 0.7 K/uL   Basophils Relative 0 %   Basophils Absolute 0.0 0.0 - 0.1 K/uL  Basic metabolic panel     Status: Abnormal   Collection Time: 10/26/15  3:48 AM  Result Value Ref Range   Sodium 137 135 - 145 mmol/L   Potassium 3.4 (L) 3.5 - 5.1 mmol/L   Chloride 102 101 - 111 mmol/L   CO2 30 22 - 32 mmol/L   Glucose, Bld 107 (H) 65 - 99 mg/dL   BUN 13 6 - 20 mg/dL   Creatinine, Ser 0.38 (L) 0.44 - 1.00 mg/dL   Calcium 8.4 (L) 8.9 - 10.3 mg/dL   GFR calc non Af Amer >60 >60 mL/min   GFR calc Af Amer >60 >60 mL/min    Comment: (NOTE) The eGFR has been calculated using the CKD EPI equation. This calculation has not been validated in all clinical situations. eGFR's persistently <60 mL/min signify possible Chronic Kidney Disease.    Anion gap 5 5 - 15  Glucose, capillary     Status: Abnormal   Collection Time: 10/26/15  8:21 AM  Result Value Ref Range   Glucose-Capillary 118 (H) 65 - 99 mg/dL   Comment 1 Notify RN    Comment 2 Document in Chart   Glucose, capillary     Status: Abnormal   Collection Time: 10/26/15 12:04 PM  Result Value Ref Range   Glucose-Capillary 113 (H) 65 - 99 mg/dL   Comment 1 Notify RN    Comment 2 Document in Chart     Current Facility-Administered Medications  Medication Dose Route  Frequency Provider Last Rate Last Dose  . acetaminophen (TYLENOL) tablet 650 mg  650 mg Oral Q6H PRN Magdalen Spatz, NP   650 mg at 10/26/15 0630   Or  . acetaminophen (TYLENOL) suppository 650 mg  650 mg Rectal Q6H PRN Magdalen Spatz, NP      . ALPRAZolam Duanne Moron) tablet 0.5 mg  0.5 mg Oral TID Gaye Pollack, MD   0.5 mg at 10/26/15 1145  . alum & mag hydroxide-simeth (MAALOX/MYLANTA) 200-200-20 MG/5ML suspension 30 mL  30 mL Oral Q4H PRN Gaye Pollack, MD   30 mL at 10/26/15 2778  . aspirin chewable tablet 81 mg  81 mg Oral Daily Gaye Pollack, MD   81 mg at 10/26/15 1144  . bisacodyl (DULCOLAX) EC tablet 10 mg  10 mg Oral Daily Gaye Pollack, MD   10 mg at 10/26/15 1135  . chlorhexidine (PERIDEX) 0.12 % solution 15 mL  15 mL Mouth Rinse BID Gaye Pollack, MD   15 mL at 10/26/15 1145  . dextrose 5 %-0.9 % sodium chloride infusion   Intravenous Continuous Gaye Pollack, MD 50 mL/hr at 10/26/15 0700    . diphenhydrAMINE (BENADRYL) injection 12.5 mg  12.5 mg Intravenous Q6H PRN Jessica D Zehr, PA-C       Or  . diphenhydrAMINE (BENADRYL) 12.5 MG/5ML elixir 12.5 mg  12.5 mg Oral Q6H PRN Jessica D Zehr, PA-C      . enoxaparin (LOVENOX) injection 40 mg  40 mg Subcutaneous  Q24H Erin R Barrett, PA-C   40 mg at 10/26/15 0742  . famotidine (PEPCID) tablet 20 mg  20 mg Oral Daily Magdalen Spatz, NP   20 mg at 10/26/15 1144  . feeding supplement (PRO-STAT SUGAR FREE 64) liquid 30 mL  30 mL Per Tube BID Gaye Pollack, MD   30 mL at 10/26/15 1145  . feeding supplement (VITAL AF 1.2 CAL) liquid 1,000 mL  1,000 mL Per Tube Continuous Vena Rua, PA-C   Stopped at 10/25/15 1300  . fentaNYL 40 mcg/mL PCA injection   Intravenous Q4H Jessica D Zehr, PA-C      . fluticasone furoate-vilanterol (BREO ELLIPTA) 100-25 MCG/INH 1 puff  1 puff Inhalation Daily Magdalen Spatz, NP   1 puff at 10/26/15 [phone]  . levalbuterol (XOPENEX) nebulizer solution 0.63 mg  0.63 mg Nebulization Q6H Erin R Barrett, PA-C   0.63 mg at  10/26/15 0840  . metoprolol tartrate (LOPRESSOR) tablet 25 mg  25 mg Oral BID Maren Reamer, MD   25 mg at 10/26/15 1145  . multivitamin with minerals tablet 1 tablet  1 tablet Oral Daily Harding, MD   1 tablet at 10/26/15 1145  . naloxone [facility]) injection 0.4 mg  0.4 mg Intravenous PRN Laban Emperor Zehr, PA-C       And  . sodium chloride flush (NS) 0.9 % injection 9 mL  9 mL Intravenous PRN Laban Emperor Zehr, PA-C      . ondansetron (ZOFRAN) tablet 4 mg  4 mg Oral Q6H PRN Magdalen Spatz, NP       Or  . ondansetron [facility]) injection 4 mg  4 mg Intravenous Q6H PRN Magdalen Spatz, NP   4 mg at 10/22/15 1640  . ondansetron (ZOFRAN) injection 4 mg  4 mg Intravenous Q6H PRN Laban Emperor Zehr, PA-C   4 mg at 10/26/15 1205  . phenol (CHLORASEPTIC) mouth spray 2 spray  2 spray Mouth/Throat PRN Vena Rua, PA-C   2 spray at 10/22/15 1118  . polyethylene glycol (MIRALAX / GLYCOLAX) packet 17 g  17 g Oral Daily PRN Magdalen Spatz, NP      . potassium chloride 10 mEq in 50 mL *CENTRAL LINE* IVPB  10 mEq Intravenous Daily PRN Gaye Pollack, MD   10 mEq at 10/25/15 1453  . simethicone (MYLICON) chewable tablet 80 mg  80 mg Oral QID Tanda Rockers, MD   80 mg at 10/26/15 1144  . sodium chloride flush (NS) 0.9 % injection 10-40 mL  10-40 mL Intracatheter Q12H Gaye Pollack, MD   10 mL at 10/26/15 1014  . sodium chloride flush (NS) 0.9 % injection 10-40 mL  10-40 mL Intracatheter PRN Gaye Pollack, MD      . sodium chloride flush (NS) 0.9 % injection 3 mL  3 mL Intravenous Q12H Magdalen Spatz, NP   3 mL at 10/26/15 1015  . sodium chloride flush (NS) 0.9 % injection 3 mL  3 mL Intravenous Q12H Magdalen Spatz, NP   3 mL at 10/26/15 1015    Musculoskeletal: Strength & Muscle Tone: within normal limits Gait & Station: normal, unable to stand Patient leans: N/A  Psychiatric Specialty Exam: Physical Exam  ROS reports nausea, abdominal pain  Blood pressure 122/71, pulse (!) 115, temperature 98.1 F  (36.7 C), temperature source Oral, resp. rate (!) 26, height '5\' 3"'$  (1.6 m), weight 164 lb 0.4 oz (74.4 kg),  SpO2 96 %.Body mass index is 29.06 kg/m.  General Appearance: Fairly Groomed- in bed  Eye Contact:  Minimal  Speech:  Slow answers questions with monosyllables or short phrases   Volume:  Decreased  Mood:  states she is feeling better now, acknowledges she has been feeling anxious   Affect:  Restricted  Thought Process:  Slow   Orientation:  Other:  oriented to " hospital, [facility], [place]"  Thought Content:  denies hallucinations, no delusions expressed   Suicidal Thoughts:  No denies homicidal ideations  Homicidal Thoughts:  No  Memory:  recent and remote fair   Judgement:  Fair  Insight:  Fair  Psychomotor Activity:  Decreased  Concentration:  Concentration: Fair and Attention Span: Fair  Recall:  [employer] of Knowledge:  Good  Language:  Fair  Akathisia:  No  Handed:  Right  AIMS (if indicated):     Assets:  Resilience  ADL's:  Impaired  Cognition:  Sedated, drowsy, oriented, as above   Sleep:      Assessment - patient presenting with significant anxiety, apprehension in the context of severe medical illness . She endorses anxiety more than she does depression, and denies any SI. At this time presents calm, sedated, drowsy, but report from Nursing Staff is that she has been anxious and agitated at times . Current sedation may be related to Xanax/Fentanyl PCA   Treatment Plan Summary: Plan Psychiatry consultant will follow with you    Disposition: No evidence of imminent risk to self or others at present.   Patient does not meet criteria for psychiatric inpatient admission. Agree with Xanax , currently at 0.5 mgrs TID , for management of anxiety. Would consider changing from standing to PRN to minimize sedation/ interactions with Opiate analgesic . Patient may benefit from antidepressant management  for depression and anxiety - would consider Zoloft, starting at low  dose - 25 mgrs QDAY and titrating up gradually as tolerated  Neita Garnet, MD 10/26/2015 12:41 PM

## 2015-10-26 NOTE — Progress Notes (Signed)
Patient has not voided during shift bladder scan shows 19ml.  Pt. Denies any urge urinate or discomfort at this time.  Pt. VSS with no s/s of distress noted.  Contacted PA Gold updated on patients condition received orders to increase fluids and a BMP to be done tomorrow AM.  Will continue to monitor.

## 2015-10-26 NOTE — Progress Notes (Signed)
[address]       [place] [address]             [phone]      9 Days Post-Op Procedure(s) (LRB): VIDEO ASSISTED THORACOSCOPY (VATS) with Drainage of Pleural Effusion (Right) DRAINAGE OF PLEURAL EFFUSION (Right) Subjective: Feels poorly, abdominal pain persists  Objective: Vital signs in last 24 hours: Temp:  [97.5 F (36.4 C)-98.5 F (36.9 C)] 97.5 F (36.4 C) (07/30 0825) Pulse Rate:  [100-119] 113 (07/30 0825) Cardiac Rhythm: Sinus tachycardia (07/30 0745) Resp:  [20-35] 20 (07/30 0825) BP: (119-128)/(57-89) 124/57 (07/30 0825) SpO2:  [92 %-97 %] 97 % (07/30 0843) Weight:  [164 lb 0.4 oz (74.4 kg)] 164 lb 0.4 oz (74.4 kg) (07/30 0341)  Hemodynamic parameters for last 24 hours:    Intake/Output from previous day: 07/29 0701 - 07/30 0700 In: 1277.2 [I.V.:827.2; NG/GT:450] Out: 1576 [Urine:1425; Stool:1; Chest Tube:150] Intake/Output this shift: Total I/O In: 94.2 [I.V.:94.2] Out: 0   General appearance: alert, fatigued and mild distress Heart: regular rate and rhythm Lungs: dim in bases Abdomen: + LUQ ttp Extremities: min edema Wound: ok  Lab Results:  Recent Labs  10/25/15 1320 10/26/15 0348  WBC 10.6* 11.4*  HGB 8.6* 8.8*  HCT 28.2* 29.3*  PLT 241 255   BMET:  Recent Labs  10/25/15 1320 10/25/15 1958 10/26/15 0348  NA 138  --  137  K 2.9* 3.4* 3.4*  CL 104  --  102  CO2 27  --  30  GLUCOSE 124*  --  107*  BUN 11  --  13  CREATININE 0.36*  --  0.38*  CALCIUM 8.0*  --  8.4*    PT/INR: No results for input(s): LABPROT, INR in the last 72 hours. ABG    Component Value Date/Time   PHART 7.473 (H) 10/05/2015 2223   HCO3 26.7 (H) 10/05/2015 2223   TCO2 24.2 10/05/2015 2223   O2SAT 92.2 10/05/2015 2223   CBG (last 3)   Recent Labs  10/25/15 1914 10/25/15 2259 10/26/15 0347  GLUCAP 114* 135* 117*    Meds Scheduled Meds: . ALPRAZolam  0.5 mg Oral TID  . aspirin  81 mg Oral Daily  . bisacodyl  10 mg Oral  Daily  . chlorhexidine  15 mL Mouth Rinse BID  . enoxaparin (LOVENOX) injection  40 mg Subcutaneous Q24H  . famotidine  20 mg Oral Daily  . feeding supplement (PRO-STAT SUGAR FREE 64)  30 mL Per Tube BID  . fluticasone furoate-vilanterol  1 puff Inhalation Daily  . levalbuterol  0.63 mg Nebulization Q6H  . LORazepam  0.5 mg Intravenous Once  . metoprolol tartrate  25 mg Oral BID  . multivitamin with minerals  1 tablet Oral Daily  . simethicone  80 mg Oral QID  . sodium chloride flush  10-40 mL Intracatheter Q12H  . sodium chloride flush  3 mL Intravenous Q12H  . sodium chloride flush  3 mL Intravenous Q12H   Continuous Infusions: . dextrose 5 % and 0.9% NaCl 50 mL/hr at 10/26/15 0700  . feeding supplement (VITAL AF 1.2 CAL) Stopped (10/25/15 1300)   PRN Meds:.acetaminophen **OR** acetaminophen, alum & mag hydroxide-simeth, fentaNYL (SUBLIMAZE) injection, ondansetron **OR** ondansetron (ZOFRAN) IV, phenol, polyethylene glycol, potassium chloride, sodium chloride flush, traMADol  Xrays Dg Chest Port 1 View  Result Date: 10/25/2015 CLINICAL DATA:  Chest tube in place EXAM: PORTABLE CHEST 1 VIEW COMPARISON:  October 24, 2015 FINDINGS: A right-sided  chest tube remains in place. A small pneumothorax remains in the right base, unchanged. Increased interstitial markings in the right lung versus the left are mildly more prominent in the interval, possibly due to technique. A layering effusion and underlying atelectasis on the left remain. Support apparatus is stable. IMPRESSION: 1. Stable support apparatus including a right chest tube. Lucency in the right costophrenic angle consistent with persistent but stable basilar pneumothorax. 2. Increased interstitial markings throughout the right lung versus the left are mildly more prominent in the interval, possibly technical in nature. 3. Small left effusion and underlying atelectasis. Electronically Signed   By: Dorise Bullion III M.D   On: 10/25/2015  08:52  Dg Abd Portable 1v  Result Date: 10/24/2015 CLINICAL DATA:  Current history of pancreatitis for which the patient is receiving jejunal tube feedings. The patient is currently not tolerating the tube feedings very well and has not had a bowel movement in approximately 36 hr. EXAM: PORTABLE ABDOMEN - 1 VIEW COMPARISON:  Abdomen x-ray 10/21/2015, 10/18/2015. CT abdomen and pelvis 10/19/2015. FINDINGS: Bowel gas pattern unremarkable without evidence of obstruction or significant ileus. Feeding tube tip not included on the image but the tube courses through the stomach and duodenum into the proximal jejunum. Surgical clips in the right upper quadrant from prior cholecystectomy. No visible opaque urinary tract calculi. IMPRESSION: No acute abdominal abnormality. Electronically Signed   By: Evangeline Dakin M.D.   On: 10/24/2015 11:25  Dg Abd Portable 2v  Result Date: 10/26/2015 CLINICAL DATA:  Abdominal distention, pain. EXAM: PORTABLE ABDOMEN - 2 VIEW COMPARISON:  10/24/2015. FINDINGS: Enteric feeding tube lies within the small bowel. RIGHT chest tube is observed, with basilar pneumothorax, roughly stable from visible priors. Lateral decubitus radiograph demonstrates no free air over the liver. Bowel gas pattern is nonobstructive. IMPRESSION: No acute intra-abdominal findings. RIGHT basilar pneumothorax. Electronically Signed   By: Staci Righter M.D.   On: 10/26/2015 07:52   Assessment/Plan: S/P Procedure(s) (LRB): VIDEO ASSISTED THORACOSCOPY (VATS) with Drainage of Pleural Effusion (Right) DRAINAGE OF PLEURAL EFFUSION (Right)  1 GI issues persist - for MRI this am 2 hypokalemia- will replace 3 tachycardia- multifactorial 4 CT + air leak- cont tube on H20 seal 5 fentanyl pca to be started   LOS: 13 days    [name] [name] 10/26/2015

## 2015-10-26 NOTE — Progress Notes (Signed)
Progress Note   Subjective  Reports the same if not increased left upper quadrant pain despite nasoenteric tube feedings being held 24 hours Getting 50 g of fentanyl every 2 hours, only provides about 20 minutes of pain relief Getting tramadol by mouth Remains anxious and states "I don't think I can do this anymore" Daughter at bedside 2 bowel movement chest are day, none today. Non-melenic, nonbloody Received IV Lasix yesterday and potassium supplementation after internal medicine consultation Psychiatry consult yesterday, should be seen today Ambulated short distance with pain   Objective  Vital signs in last 24 hours: Temp:  [97.5 F (36.4 C)-98.5 F (36.9 C)] 97.5 F (36.4 C) (07/30 0825) Pulse Rate:  [100-119] 113 (07/30 0825) Resp:  [20-35] 20 (07/30 0825) BP: (119-128)/(57-89) 124/57 (07/30 0825) SpO2:  [92 %-97 %] 97 % (07/30 0843) Weight:  [164 lb 0.4 oz (74.4 kg)] 164 lb 0.4 oz (74.4 kg) (07/30 0341) Last BM Date: 10/24/15 General: Alert, and lying in bed, nasal feeding tube clamped, NAD Heart:  Tachy, reg Chest: course b/l, decreased b/l bases Abdomen:  Soft, significantly tender epigastrium (L>>R), mildly distended, BS slightly hypoactive today but present Extremities:  Trace to 1+ edema Neurologic:  Alert and  oriented x4; grossly normal neurologically. Psych:  Alert and cooperative. Depressed mood and currently anxious appearing   Intake/Output from previous day: 07/29 0701 - 07/30 0700 In: 1277.2 [I.V.:827.2; NG/GT:450] Out: 1576 [Urine:1425; Stool:1; Chest Tube:150] Intake/Output this shift: Total I/O In: 94.2 [I.V.:94.2] Out: 0   Lab Results:  Recent Labs  10/25/15 1320 10/26/15 0348  WBC 10.6* 11.4*  HGB 8.6* 8.8*  HCT 28.2* 29.3*  PLT 241 255   BMET  Recent Labs  10/25/15 1320 10/25/15 1958 10/26/15 0348  NA 138  --  137  K 2.9* 3.4* 3.4*  CL 104  --  102  CO2 27  --  30  GLUCOSE 124*  --  107*  BUN 11  --  13  CREATININE  0.36*  --  0.38*  CALCIUM 8.0*  --  8.4*   LFT  Recent Labs  10/25/15 1320  PROT 4.2*  ALBUMIN 1.8*  AST 14*  ALT 15  ALKPHOS 78  BILITOT 0.5   PT/INR No results for input(s): LABPROT, INR in the last 72 hours. Hepatitis Panel No results for input(s): HEPBSAG, HCVAB, HEPAIGM, HEPBIGM in the last 72 hours.  Studies/Results: Dg Chest Port 1 View  Result Date: 10/25/2015 CLINICAL DATA:  Chest tube in place EXAM: PORTABLE CHEST 1 VIEW COMPARISON:  October 24, 2015 FINDINGS: A right-sided chest tube remains in place. A small pneumothorax remains in the right base, unchanged. Increased interstitial markings in the right lung versus the left are mildly more prominent in the interval, possibly due to technique. A layering effusion and underlying atelectasis on the left remain. Support apparatus is stable. IMPRESSION: 1. Stable support apparatus including a right chest tube. Lucency in the right costophrenic angle consistent with persistent but stable basilar pneumothorax. 2. Increased interstitial markings throughout the right lung versus the left are mildly more prominent in the interval, possibly technical in nature. 3. Small left effusion and underlying atelectasis. Electronically Signed   By: Dorise Bullion III M.D   On: 10/25/2015 08:52  Dg Abd Portable 2v  Result Date: 10/26/2015 CLINICAL DATA:  Abdominal distention, pain. EXAM: PORTABLE ABDOMEN - 2 VIEW COMPARISON:  10/24/2015. FINDINGS: Enteric feeding tube lies within the small bowel. RIGHT chest tube is observed, with basilar pneumothorax,  roughly stable from visible priors. Lateral decubitus radiograph demonstrates no free air over the liver. Bowel gas pattern is nonobstructive. IMPRESSION: No acute intra-abdominal findings. RIGHT basilar pneumothorax. Electronically Signed   By: Staci Righter M.D.   On: 10/26/2015 07:52     Assessment & Plan  70 year old female with multiple complex medical issues including pancreatitis of unclear  etiology with resultant peripancreatic fluid collections, malnutrition with hypoalbuminemia, empyema status post VATS with chest tube still in place, significant anxiety, macrocytic anemia, and Wegener's granulomatosis.  1. Subacute pancreatitis with peripancreatic fluid collections -- severe left upper quadrant pain which seems to be worsening despite 24 hours off tube feeds. Repeat abdominal x-ray today reviewed and normal bowel gas pattern with no evidence of ileus or obstruction. --MRI/MRCP today -- evaluate pancreatitis, peripancreatic fluid collections. Evaluate for pancreatic mass/malignancy. --Change to fentanyl PCA for more adequate pain control. Discontinue tramadol while on PCA  --Resume tube feeds for nutrition as having them off did not change pain   2. Anxiety and depression -- getting Xanax currently with psychiatry consult pending  3. Empyema with pneumothorax -- status post VATS, chest tube being followed by CT surgery  4. Macrocytic anemia -- hemoglobin stable today  5. Mild volume overload/hypokalemia -- internal medicine now following. Received IV Lasix yesterday and potassium supplementation   Discussed with patient and daughter, Suanne Marker, who is at bedside  Discussed with Jadene Pierini with CT surgery    Active Problems:   Pleural effusion, right   Pleural effusion associated with pancreatitis   Pneumothorax   Acute respiratory failure with hypoxia (HCC)   Abdominal discomfort   LUQ pain   Protein-calorie malnutrition (HCC)   Pancreatitis   SOB (shortness of breath)     LOS: 13 days   Chrystopher Stangl M  10/26/2015, 11:21 AM [id number] 8a-5p weekdays [phone] after 5p, weekends, holidays

## 2015-10-26 NOTE — Consult Note (Signed)
Medical Consultation   [name] [name]  YQI:[id number]  DOB: 23-Apr-1945  DOA: 10/02/2015  PCP: [name], MD    Requesting physician: Dr.Jay M Pyrtle GI  Reason for consultation: Multisystem organ failure   History of Present Illness: [name] [name] is an 70 y.o. WF PMHx Depression, Anxiety COPD, Tobacco abuse, RLL lung cavitary mass Recurrent Enlarging Rt Hydropneumothorax S/P:right thoracentesis 7/3 removing 1.4 L Exudative fluid/.Repeat thoracentesis on 7/6 removed 1L of the same fluid . 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 and a recent admission due to acute pancreatitis with pseudocyst.   07/24/2015 she underwent limited autoimmune workup that showed screening p-ANCA positive (no c-anca, pr3 or mpo avail). Then 08/06/15 she had full panel with Dr Trudie Reed and per phone call PR-3 and MPO antibody results were NEGATIVE and only P-ANCA was trace posiotive. 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 Patient then committed to high-dose prednisone and also Rituxan once a week for 4 weeks infusion. She completed 3 weeks of this infusion he did she was supposed to get her fourth dose a little 10/01/2015 on 10/24/2015 but she missed it because of illness.  She believes ever since starting high-dose prednisone she started developing abdominal pain. A CT abdomen 09/23/2015 showed the right lower lobe mass at 2.6 cm perhaps smaller but obscured by a right pleural effusion that was small. But she had acute pancreatitis features with small pseudocyst. She blames the high-dose prednisone for her current problems. She does not think Rituxan is the causative agent for this.   She reports she started developing abdominal pain and finally was admitted 09/28/2015. Since admission the right pleural effusion got worse and she had a thoracentesis 09/29/2015 that showed idiopathic exudate with acute inflammatory  cells and mesothelial cells with an LDH of 400. Thoracentesis was repeated 10/02/2015 with an LDH of 336. This was complicated by small right basilar pneumothorax on chest x-ray  10/03/2015 which radiology is conservatively following. She was discharged from [facility] on 10/07/15      7/26 GI Note* Pancreatitis of unclear etiology.  7/3 and7/24 CT scans (c/w 09/23/15 CT #1):increased size and # of peripancreatic fluid collections unable to rule out cystic neoplasm. CA-19-9 is 535, normal CEA.  Raises question of cystic neoplasm.   Initial clinical presentation was 08/2015. Previous cholecystectomy 2011. Left abdominal pain improved but persists along with nausea and anorexia.  Likely needs another PET scan as outpt.  * Hypoalbuminemia. Protein cal malnutrition, prealbumin 16.2.tolerating tube feeds, dislikes feeding tube but willing to keep in place for now.   * 10/08/2015 VATS assisted right empyema drainage, WBCs but no organisms on stain. Chest tube in place. 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 Cytology 7/3 and 7/6, negative for cancer.   * Lytic lesion right hip. 07/07/15 PET-CT: hypermetabolic activity in right lung (subsequently proven not to have cancer), left ribs, nothing in hips. CA-19-9 is 535, normal CEA.  Raises question of underlying pancreatic malignancy.  Likely needs another PET scan as outpt. C/o pain in that hip while walking today.   * Depression, anxiety. This along with pain causing subpar participation in PT.  2013 - 2014 PMD notes mention "doing better" on Prozac. Seroquel caused palpitations.  Trazodone worsened insomnia.  Inderal for tremor/palpitations worsened depression.   * Anemia, macrocytic. Hx hemochromatosis treated with Phlebotomy (last on 06/16/2015),  followed by Dr [name] . Folate processing, B12 normal.     *    Review of Systems:  Review of Systems  Constitutional: Positive for malaise/fatigue.    HENT: Positive for sore throat.   Eyes: Negative.   Respiratory: Positive for shortness of breath and wheezing.   Cardiovascular: Positive for chest pain and leg swelling.  Gastrointestinal: Positive for abdominal pain and diarrhea.  Genitourinary: Positive for flank pain.  Musculoskeletal: Positive for back pain and neck pain.  Skin: Negative.   Neurological: Positive for weakness.  Psychiatric/Behavioral: Positive for depression.     Past Medical History: Past Medical History:  Diagnosis Date  . Anxiety   . Arthritis    "all over me, I guess" (10/15/2015)  . ASTHMA   . 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 CAD (coronary artery disease)   . 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 Chronic kidney disease   . COPD (chronic obstructive pulmonary disease) with emphysema ([place])    mild dz on PFTs 11/2013  . Depression   . GERD   . 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 H/O hiatal hernia   . Hereditary hemochromatosis ([place]) 2004  . Hx of adenomatous polyp of colon 07/25/2014  . Hyperlipidemia   . HYPERTENSION   . Hypertension   . Migraine    "hx; not anymore" (10/06/2015)  . On home oxygen therapy    "2L; 24/7" (10/24/2015)  . Osteopenia 10/07/2014   DEXA @ [facility] 09/2014: -1.9 L fem  . PONV (postoperative nausea and vomiting)   . Sinus headache   . Urinary tract infection    "recurrent sometimes" (09/29/2015)    Past Surgical History: Past Surgical History:  Procedure Laterality Date  . ABDOMINAL HYSTERECTOMY  ~ 1983  . APPENDECTOMY  ~ 1983  . BLADDER SUSPENSION  03/29/2002  . COLONOSCOPY W/ BIOPSIES    . CYSTECTOMY     cyst removed from intestines to ovary    . CYSTOSCOPY  01/24/14   NEGATIVE;Dr Ottelin  . DILATION AND CURETTAGE OF UTERUS    . ESOPHAGOGASTRODUODENOSCOPY    . LAPAROSCOPIC CHOLECYSTECTOMY  ~ 2011  . PLEURAL EFFUSION DRAINAGE Right 10/10/2015   Procedure: DRAINAGE OF PLEURAL EFFUSION;  Surgeon: Gaye Pollack, MD;  Location: [facility];  Service: Thoracic;  Laterality: Right;  . TENDON RELEASE Left    wrist   . VIDEO ASSISTED THORACOSCOPY (VATS)/THOROCOTOMY Right 09/27/2015   Procedure: VIDEO ASSISTED THORACOSCOPY (VATS) with Drainage of Pleural Effusion;  Surgeon: Gaye Pollack, MD;  Location: [facility];  Service: Thoracic;  Laterality: Right;     Allergies:   Allergies  Allergen Reactions  . Other Other (See Comments)    Please if giving pt anesthesia - she would like to have a scopolamine patch to prevent nausea   . Ciprofloxacin Hives and Other (See Comments)    Blisters on legs  . Codeine Nausea And Vomiting and Other (See Comments)    Sweating, "passes out"  . Macrobid [employer] Macro] Other (See Comments)    Stool incontinence  . Morphine And Related Other (See Comments)    "hospital bed was shaking"  . Prevnar [Pneumococcal 13-Val Conj Vacc] Palpitations and Other (See Comments)    Pt c/o redness, swelling on arm after shot.  Pt c/o having respiratory problems and coughing ever since she got it.      Social History:  reports that she quit smoking about 8 weeks ago. Her smoking use  included Cigarettes. She has a 27.00 pack-year smoking history. She has never used smokeless tobacco. She reports that she does not drink alcohol or use drugs.   Family History: Family History  Problem Relation Age of Onset  . Hypertension Mother   . Heart disease Mother   . Breast cancer Mother 56  . Diabetes Father   . Heart disease Father   . Alcohol abuse Other   . Arthritis Other   . Cirrhosis Brother   . Cirrhosis Brother   . Cirrhosis Brother   . Lung cancer Brother   . Colon cancer Neg Hx   . Colon polyps Neg Hx   . Esophageal cancer Neg Hx   . Rectal cancer Neg Hx   . Stomach cancer Neg Hx      Procedures/Significant Events:  March/2017 CT of the abdomen -3.5 cm RLL lung cavitary mass.  4/10 PET scan;  lesion to be hypermetabolic with an SUV max of 10.0.  -Multiple areas of right sided hypermetabolic pleural thickening and nodularity suspicious for metastatic disease. -Lt posterior 7th rib hypermetabolism and probable lucency suspicious for isolated bone met. 4/20 CT guided Biopsy of the RLL lung lesion negative for malignancy, positive vasculitic features. CT of the abdomen on 6/27 - RLL lung mass/ small right pleural effusion.  -Acute pancreatitis with a small pseudocyst    09/16/15 TTE: Normal LV size with mild LV hypertrophy, EF 65-70%. Normal RV size and systolic function. No significant valvular abnormalities. 7/07 CT Chest: moderate Rt hydropneumothorax, abnormal pleural thickening with scattered areas of pleural fluid in the Rt hemithorax, additional subpleural reticulation / fibrosis with interlobular septal thickening R>L, fluid along the pancreatic body/tail related to known acute pancreatitis 7/03 Cytology from [facility]: acute inflammation, no malignant cells 7/07 Cytology from [facility]: no malignant cells 7/21 S/P right VATS: Loculated Recurrent Right Exudative Pleural Effusion c/w empyema         Cultures Urine Ctx 7/2:  Recommended recollection Urine Ctx 7/3:  Recommended recollection R Pleural Fluid 7/3:  Negative R Pleural Fluid 7/6:  Negative MRSA PCR 7/20:  Negative R Pleural Fluid 7/21> neg gm stain>> R Parietal Pleural Biopsy 7/21> neg    Antimicrobials: Cefuroxime 7/20 - 7/21 (surgical prophylaxis)    Devices    LINES / TUBES:  7/21 right chest tube 35F>>    Continuous Infusions: . dextrose 5 % and 0.9% NaCl 80 mL/hr at 10/26/15 1338  . feeding supplement (VITAL AF 1.2 CAL) 1,000 mL (10/26/15 1330)     Physical Exam: Vitals:   10/26/15 1200 10/26/15 1424 10/26/15 1558 10/26/15 1600  BP: 122/71  125/79   Pulse: (!) 115  (!) 110   Resp: (!) 26  (!) 32 (!) 21  Temp: 98.1 F (36.7 C)  98.3 F (36.8 C)   TempSrc: Oral  Oral   SpO2: 96% 94% 94% 91%  Weight:      Height:        General: A/O  4, positive acute on chronic respiratory distress Eyes: negative scleral hemorrhage, negative anisocoria, negative icterus ENT: Negative Runny nose, negative gingival bleeding, Neck:  Negative scars, masses, torticollis, lymphadenopathy, JVD, right IJ CVL covered and clean, Lungs: negative breath sounds are LL/RML, positive expiratory wheezing left lung field and RUL, negative, right chest tube in place with serosanguineous fluid + continued air leak Cardiovascular: Tachycardic, Regular rhythm without murmur gallop or rub normal S1 and S2 Abdomen: Positive abdominal pain, positive distention, hypoactive bowel sounds, no rebound, no ascites,  no appreciable mass Extremities: No significant cyanosis, clubbing, or edema bilateral lower extremities Skin: Negative rashes, lesions, ulcers Psychiatric:  Negative depression, negative anxiety, negative fatigue, negative mania  Central nervous system:  Cranial nerves II through XII intact, tongue/uvula midline, all extremities muscle strength 5/5, sensation intact throughout,negative dysarthria, negative expressive aphasia, negative receptive aphasia.  Data reviewed:  I have personally reviewed following labs and imaging studies Labs:  CBC:  Recent Labs Lab 10/20/15 0515 10/21/15 0430 10/22/15 0410 10/25/15 1320 10/26/15 0348  WBC 10.6* 11.6*  --  10.6* 11.4*  NEUTROABS 7.4 8.2*  --   --  7.3  HGB 8.5* 9.5*  --  8.6* 8.8*  HCT 27.2* 30.5* 27.5* 28.2* 29.3*  MCV 99.3 101.0*  --  100.7* 102.4*  PLT 222 249  --  241 415    Basic Metabolic Panel:  Recent Labs Lab 10/20/15 0515 10/21/15 0430 10/21/15 0436 10/25/15 1320 10/25/15 1543 10/25/15 1958 10/26/15 0348  NA 138  --  138 138  --   --  137  K 3.4*  --  3.9 2.9*  --  3.4* 3.4*  CL 104  --  106 104  --   --  102  CO2 29  --  26 27  --   --  30  GLUCOSE 88  --  99 124*  --   --  107*  BUN 12  --  12 11  --   --  13  CREATININE 0.44  --  0.51 0.36*  --   --  0.38*  CALCIUM 8.1*  --   8.1* 8.0*  --   --  8.4*  MG 2.0 1.9  --   --  1.9  --   --   PHOS 3.4  --  3.1  --   --   --   --    GFR Estimated Creatinine Clearance: 64.1 mL/min (by C-G formula based on SCr of 0.8 mg/dL). Liver Function Tests:  Recent Labs Lab 10/20/15 0515 10/21/15 0436 10/25/15 1320  AST  --   --  14*  ALT  --   --  15  ALKPHOS  --   --  78  BILITOT  --   --  0.5  PROT  --   --  4.2*  ALBUMIN 1.9* 1.9* 1.8*    Recent Labs Lab 10/23/15 0312  LIPASE 99*   No results for input(s): AMMONIA in the last 168 hours. Coagulation profile No results for input(s): INR, PROTIME in the last 168 hours.  Cardiac Enzymes: No results for input(s): CKTOTAL, CKMB, CKMBINDEX, TROPONINI in the last 168 hours. BNP: Invalid input(s): POCBNP CBG:  Recent Labs Lab 10/25/15 2259 10/26/15 0347 10/26/15 0821 10/26/15 1204 10/26/15 1555  GLUCAP 135* 117* 118* 113* 127*   D-Dimer No results for input(s): DDIMER in the last 72 hours. Hgb A1c No results for input(s): HGBA1C in the last 72 hours. Lipid Profile No results for input(s): CHOL, HDL, LDLCALC, TRIG, CHOLHDL, LDLDIRECT in the last 72 hours. Thyroid function studies  Recent Labs  10/25/15 1625  TSH 2.781   Anemia work up  Recent Labs  10/25/15 1543 10/25/15 1625  VITAMINB12  --  465  FERRITIN  --  217  TIBC 154*  --   IRON 25*  --    Urinalysis    Component Value Date/Time   COLORURINE YELLOW 09/29/2015 [place] 09/29/2015 1148   LABSPEC 1.007 09/29/2015 1148   PHURINE 5.5 09/29/2015  [place] 09/29/2015 1148   GLUCOSEU NEGATIVE 07/23/2015 0901   HGBUR SMALL (A) 09/29/2015 1148   BILIRUBINUR NEGATIVE 09/29/2015 1148   BILIRUBINUR 3+ 05/21/2015 0932   KETONESUR NEGATIVE 09/29/2015 1148   PROTEINUR NEGATIVE 09/29/2015 1148   UROBILINOGEN 0.2 07/23/2015 0901   NITRITE NEGATIVE 09/29/2015 1148   LEUKOCYTESUR MODERATE (A) 09/29/2015 1148     Microbiology Recent Results (from the past  240 hour(s))  MRSA PCR Screening     Status: None   Collection Time: 10/16/15 11:24 PM  Result Value Ref Range Status   MRSA by PCR NEGATIVE NEGATIVE Final    Comment:        The GeneXpert MRSA Assay (FDA approved for NASAL specimens only), is one component of a comprehensive MRSA colonization surveillance program. It is not intended to diagnose MRSA infection nor to guide or monitor treatment for MRSA infections.   Culture, body fluid-bottle     Status: None   Collection Time: 10/14/2015  8:42 AM  Result Value Ref Range Status   Specimen Description FLUID RIGHT PLEURAL  Final   Special Requests NONE  Final   Culture NO GROWTH 5 DAYS  Final   Report Status 10/22/2015 FINAL  Final  Gram stain     Status: None   Collection Time: 10/14/2015  8:42 AM  Result Value Ref Range Status   Specimen Description FLUID RIGHT PLEURAL  Final   Special Requests NONE  Final   Gram Stain   Final    CYTOSPIN SMEAR WBC PRESENT,BOTH PMN AND MONONUCLEAR NO ORGANISMS SEEN    Report Status 10/10/2015 FINAL  Final  Aerobic/Anaerobic Culture (surgical/deep wound)     Status: None   Collection Time: 10/11/2015  8:49 AM  Result Value Ref Range Status   Specimen Description BIOPSY RIGHT PLEURAL  Final   Special Requests PATIENT ON FOLLOWING  ZYNACEF SPECIMEN B  Final   Gram Stain   Final    FEW WBC PRESENT,BOTH PMN AND MONONUCLEAR NO ORGANISMS SEEN    Culture NO GROWTH 5 DAYS  Final   Report Status 10/22/2015 FINAL  Final       Inpatient Medications:   Scheduled Meds: . aspirin  81 mg Oral Daily  . bisacodyl  10 mg Oral Daily  . chlorhexidine  15 mL Mouth Rinse BID  . enoxaparin (LOVENOX) injection  40 mg Subcutaneous Q24H  . famotidine  20 mg Oral Daily  . feeding supplement (PRO-STAT SUGAR FREE 64)  30 mL Per Tube BID  . fentaNYL   Intravenous Q4H  . fluticasone furoate-vilanterol  1 puff Inhalation Daily  . levalbuterol  0.63 mg Nebulization Q6H  . metoprolol tartrate  25 mg Oral BID  .  multivitamin with minerals  1 tablet Oral Daily  . sertraline  25 mg Oral Daily  . simethicone  80 mg Oral QID  . sodium chloride flush  10-40 mL Intracatheter Q12H  . sodium chloride flush  3 mL Intravenous Q12H  . sodium chloride flush  3 mL Intravenous Q12H   Continuous Infusions: . dextrose 5 % and 0.9% NaCl 80 mL/hr at 10/26/15 1338  . feeding supplement (VITAL AF 1.2 CAL) 1,000 mL (10/26/15 1330)     Radiological Exams on Admission: Mr Abdomen Mrcp Wo Cm  Result Date: 10/26/2015 CLINICAL DATA:  Acute pancreatitis and history of pseudocysts. EXAM: MRI ABDOMEN WITHOUT CONTRAST  (INCLUDING MRCP) TECHNIQUE: Multiplanar multisequence MR imaging of the abdomen was performed. Heavily T2-weighted images of the  biliary and pancreatic ducts were obtained, and three-dimensional MRCP images were rendered by post processing. COMPARISON:  CT scan 10/19/2015 FINDINGS: Examination is quite limited due to respiratory motion. The lungs demonstrate severe lung disease along with the pleural effusions. The heart is mildly enlarged. Benign-appearing hepatic cysts are again demonstrated. No worrisome hepatic lesions or intrahepatic biliary dilatation. The gallbladder surgically absent. Normal caliber and course of the common bile duct. No obvious common bile ducts stones. The pancreatic duct is also normal in caliber and has a normal course. No pancreatic divisum. In the midbody region of the pancreas I do not see the pancreatic duct. I assume it is mildly compressed by acute inflammation/ pancreatitis. I do not see an obvious mass and I do not see any mass lesion the prior CT scans. There is fluid around the pancreas consistent with pancreatitis. Small pseudocysts are again noted. No mesenteric or retroperitoneal mass or adenopathy. IMPRESSION: 1. Very limited study due to respiratory motion. 2. Normal caliber and course of the common bile duct. No common bile duct stones. 3. Findings consistent with pancreatitis.  No mass and no evidence of pancreatic divisum. 4. Stable hepatic cysts. 5. Extensive bilateral lung disease and effusions. Electronically Signed   By: Marijo Sanes M.D.   On: 10/26/2015 13:48  Dg Chest Port 1 View  Result Date: 10/25/2015 CLINICAL DATA:  Chest tube in place EXAM: PORTABLE CHEST 1 VIEW COMPARISON:  October 24, 2015 FINDINGS: A right-sided chest tube remains in place. A small pneumothorax remains in the right base, unchanged. Increased interstitial markings in the right lung versus the left are mildly more prominent in the interval, possibly due to technique. A layering effusion and underlying atelectasis on the left remain. Support apparatus is stable. IMPRESSION: 1. Stable support apparatus including a right chest tube. Lucency in the right costophrenic angle consistent with persistent but stable basilar pneumothorax. 2. Increased interstitial markings throughout the right lung versus the left are mildly more prominent in the interval, possibly technical in nature. 3. Small left effusion and underlying atelectasis. Electronically Signed   By: Dorise Bullion III M.D   On: 10/25/2015 08:52  Dg Abd Portable 2v  Result Date: 10/26/2015 CLINICAL DATA:  Abdominal distention, pain. EXAM: PORTABLE ABDOMEN - 2 VIEW COMPARISON:  10/24/2015. FINDINGS: Enteric feeding tube lies within the small bowel. RIGHT chest tube is observed, with basilar pneumothorax, roughly stable from visible priors. Lateral decubitus radiograph demonstrates no free air over the liver. Bowel gas pattern is nonobstructive. IMPRESSION: No acute intra-abdominal findings. RIGHT basilar pneumothorax. Electronically Signed   By: Staci Righter M.D.   On: 10/26/2015 07:52   Impression/Recommendations Active Problems:   Pleural effusion, right   Pleural effusion associated with pancreatitis   Pneumothorax   Acute respiratory failure with hypoxia ([facility])   Abdominal discomfort   LUQ pain   Protein-calorie malnutrition ([facility])    Pancreatitis   SOB (shortness of breath)   Right Lower Lobe & Left Upper Lobe Masses:   -Treated as ANCA (-) vasculitis (possible Wegener's dse) with Prednisone and Rituximab in June 2017.  -RLL pleura/lung final pathology is still pending. Pathologist is doing more stains. He thinks there is carcinoma in RLL specimen. -Right chest tube in place causing significant pain, continue Fentanyl PCA  COPD:   - Pulmicort nebulizer BID.  - Continuing Xopenex nebs q6hr scheduled  -Titrate O2 to maintain SPO2 89-93% -NOTE; patient unable to use inhaler as she cannot/will not take a deep breath.   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 Concern for cystic neoplasm (Pancreas) - increased size and # of peripancreatic fluid collections unable to rule out cystic neoplasm. CA-19-9 is 535, normal CEA. - GI following > eventual MRI/MRCP vs EUS as an oupt per GI. - tolerating TF - has abd pain which seems to be at baseline.   Possible lytic lesion in Rt acetabulum  -not seen in previous ct scan or PET scan.   Coronary Artery Disease and  HTN:   - Continuing ASA 78m daily  Sinus tachycardia -most likely multifactorial to include anxiety, pain from chest tube/ VATS. -Fentanyl PCA --See anxiety/depression -Metoprolol IV 5 mg QID  Anxiety/Depression - Ativan 1 mg BID (note patient on Xanax TID at home) - 7/30 Zoloft 25 mg daily  Tobacco abuse disorder - Cessation counseling prior to discharge -Nicotine patch 21 mg  Hemochromatosis -treated with Phlebotomy (last on 06/16/2015), followed by Dr GAlvy Bimler. Folate processing, B12 normal.   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 Discontinued  7/5?  - P-ANCA elevated at 1:1.6 on 07/23/15, normal at <1:20 on 10/03/15. Receiving weekly Rituxan infusions through rheumatologist, Dr  HLenna Gilford Chronic diarrhea,  -suspected post cholecystectomy related. ?IBS-D. currently not having issues with diarrhea.  -Colonoscopy 06/2014: benign cecal polyp, normal random biopsies, non-bleeding cecal AVM.      Goals of care -Spoke with daughter who understands most likely the stomach pancreatic cancer but would like to hold on telling mother (very anxious/depressed) until final pathology report. -Discussed making patient DO NOT RESUSCITATE, leaning in that direction but would like to discuss issue with sister. Family meeting scheduled for 8/1 _0              Thank you for this consultation.  [facility] will follow the patient with you.   Time Spent: 120 minutes  WOODS, CGeraldo DockerM.D. [facility] 10/26/2015, 5:21 PM

## 2015-10-27 DIAGNOSIS — R140 Abdominal distension (gaseous): Secondary | ICD-10-CM

## 2015-10-27 LAB — GLUCOSE, CAPILLARY
GLUCOSE-CAPILLARY: 142 mg/dL — AB (ref 65–99)
GLUCOSE-CAPILLARY: 147 mg/dL — AB (ref 65–99)
GLUCOSE-CAPILLARY: 147 mg/dL — AB (ref 65–99)
GLUCOSE-CAPILLARY: 148 mg/dL — AB (ref 65–99)
GLUCOSE-CAPILLARY: 149 mg/dL — AB (ref 65–99)
Glucose-Capillary: 155 mg/dL — ABNORMAL HIGH (ref 65–99)

## 2015-10-27 LAB — BASIC METABOLIC PANEL
Anion gap: 7 (ref 5–15)
BUN: 14 mg/dL (ref 6–20)
CO2: 27 mmol/L (ref 22–32)
Calcium: 8.1 mg/dL — ABNORMAL LOW (ref 8.9–10.3)
Chloride: 104 mmol/L (ref 101–111)
Creatinine, Ser: 0.36 mg/dL — ABNORMAL LOW (ref 0.44–1.00)
GFR calc Af Amer: >60 mL/min (ref >60)
GLUCOSE: 149 mg/dL — AB (ref 65–99)
POTASSIUM: 3.4 mmol/L — AB (ref 3.5–5.1)
Sodium: 138 mmol/L (ref 135–145)

## 2015-10-27 LAB — MAGNESIUM: Magnesium: 1.9 mg/dL (ref 1.7–2.4)

## 2015-10-27 LAB — FOLATE RBC
FOLATE, HEMOLYSATE: 518.2 ng/mL
FOLATE, RBC: 1884 ng/mL (ref >498)
Hematocrit: 27.5 % — ABNORMAL LOW (ref 34.0–46.6)

## 2015-10-27 MED ORDER — LORAZEPAM 2 MG/ML IJ SOLN
1.0000 mg | Freq: Four times a day (QID) | INTRAMUSCULAR | Status: DC
  Administered 2015-10-27 – 2015-10-28 (×5): 1 mg via INTRAVENOUS
  Filled 2015-10-27 (×5): qty 1

## 2015-10-27 NOTE — Progress Notes (Signed)
   10/27/15 1600  Clinical Encounter Type  Visited With Patient  Visit Type Follow-up  Referral From Chaplain  Consult/Referral To Chaplain  Spiritual Encounters  Spiritual Needs Prayer  CHP followed up with patient whose spirits continue to be low.  Offered presence and prayer. Roe Coombs 10/27/15

## 2015-10-27 NOTE — Progress Notes (Signed)
   10/27/15 1200  Clinical Encounter Type  Visited With Patient and family together  Visit Type Spiritual support  Referral From Nurse  Spiritual Encounters  Spiritual Needs Prayer;Emotional  Stress Factors  Patient Stress Factors Health changes;Loss of control  Family Stress Factors Health changes;Exhausted  Chaplain called to assist anxious patient who was concerned that she was not going to get better. Ascertained much of this from niece, as patient had just been given anti-anxiety medication on top of pain medicine and was very drowsy. Referred patient to unit chaplain for follow-up visit. Tiant Peixoto, Chaplain

## 2015-10-27 NOTE — Care Management Important Message (Signed)
Important Message  Patient Details  Name: [name] [name] MRN: [id number] Date of Birth: 1946/03/07   Medicare Important Message Given:  Yes    Loann Quill 10/27/2015, 3:26 PM

## 2015-10-27 NOTE — Progress Notes (Addendum)
      [address]       [place] [address]             [phone]      10 Days Post-Op Procedure(s) (LRB): VIDEO ASSISTED THORACOSCOPY (VATS) with Drainage of Pleural Effusion (Right) DRAINAGE OF PLEURAL EFFUSION (Right)   Subjective:  [name] [name] continues to complain of shortness of breath.  She has constant abdominal pain without relief of PCA.   Objective: Vital signs in last 24 hours: Temp:  [97.7 F (36.5 C)-98.3 F (36.8 C)] 97.7 F (36.5 C) (07/31 0720) Pulse Rate:  [102-122] 122 (07/31 0354) Cardiac Rhythm: Sinus tachycardia (07/31 0720) Resp:  [21-33] 30 (07/31 0828) BP: (112-140)/(70-81) 120/75 (07/31 0354) SpO2:  [91 %-97 %] 97 % (07/31 0828) Weight:  [158 lb (71.7 kg)] 158 lb (71.7 kg) (07/31 0500)  Intake/Output from previous day: 07/30 0701 - 07/31 0700 In: 1683.5 [I.V.:1194; NG/GT:489.5] Out: 705 [Urine:525; Chest Tube:180]  General appearance: alert, cooperative and no distress Heart: regular rate and rhythm Lungs: diminished breath sounds bibasilar Abdomen: soft,  + tenderness, + BS masses,  no organomegaly Wound: clean and dry  Lab Results:  Recent Labs  10/25/15 1320 10/26/15 0348  WBC 10.6* 11.4*  HGB 8.6* 8.8*  HCT 28.2* 29.3*  PLT 241 255   BMET:  Recent Labs  10/26/15 0348 10/27/15 0505  NA 137 138  K 3.4* 3.4*  CL 102 104  CO2 30 27  GLUCOSE 107* 149*  BUN 13 14  CREATININE 0.38* 0.36*  CALCIUM 8.4* 8.1*    PT/INR: No results for input(s): LABPROT, INR in the last 72 hours. ABG    Component Value Date/Time   PHART 7.473 (H) 10/05/2015 2223   HCO3 26.7 (H) 10/05/2015 2223   TCO2 24.2 10/05/2015 2223   O2SAT 92.2 10/05/2015 2223   CBG (last 3)   Recent Labs  10/26/15 1932 10/26/15 2259 10/27/15 0353  GLUCAP 130* 159* 149*    Assessment/Plan: S/P Procedure(s) (LRB): VIDEO ASSISTED THORACOSCOPY (VATS) with Drainage of Pleural Effusion (Right) DRAINAGE OF PLEURAL EFFUSION (Right)  1. Chest  tube- continued air leak, leave chest tube in place 2. Pulm- + dyspnea, patient is anxious and gets worked up and complains of difficulty breathing 3. CV- Tachy- multifactorial 4. GI- constant GI pain, pancreatitis, NG tube in place- GI following 5. Anxiety/Depression- Psych following patient, on Zoloft, BID,  On Xanax BID.. Nursing requests increase or breakthrough doses 6. Dispo- leave chest tube in place, will place Ativan to every 6 hours, however will defer to medicine for further medication adjustments  LOS: 14 days    Ellwood Handler 10/27/2015   Chart reviewed, patient examined, agree with above. There is still an air leak. Continue chest tube to water seal. Pathology pending from pleural biopsies.

## 2015-10-27 NOTE — Consult Note (Signed)
  [facility] TEAM 1 - Stepdown/ICU TEAM  Pt seen by PCCM in f/u today.  In that the services provided by PCCM are redundant with those of the [facility], I have not seen the pt today.    Dr. Sherral Hammers will f/u w/ the pt tomorrow, when he plans to have a family conference.    No charge from [facility] today.  Cherene Altes, MD [facility] Office  [phone] Pager - Text Page per Amion as per below:  On-Call/Text Page:      [email or web address]      password [email or web address]  If 7PM-7AM, please contact night-coverage [email or web address] Password [facility] 10/27/2015, 3:18 PM

## 2015-10-27 NOTE — Progress Notes (Signed)
Daily Rounding Note  10/27/2015, 9:05 AM  LOS: 14 days   SUBJECTIVE:   Chief complaint: abdominal pain, "I can't breathe"   Abdominal pain persists.  No nausea.    OBJECTIVE:         Vital signs in last 24 hours:    Temp:  [97.7 F (36.5 C)-98.3 F (36.8 C)] 97.7 F (36.5 C) (07/31 0720) Pulse Rate:  [102-122] 117 (07/31 0714) Resp:  [21-33] 30 (07/31 0828) BP: (112-141)/(70-81) 141/72 (07/31 0714) SpO2:  [90 %-97 %] 97 % (07/31 0828) Weight:  [71.7 kg (158 lb)] 71.7 kg (158 lb) (07/31 0500) Last BM Date: 10/27/15 Filed Weights   10/25/15 0410 10/26/15 0341 10/27/15 0500  Weight: 73.1 kg (161 lb 2.5 oz) 74.4 kg (164 lb 0.4 oz) 71.7 kg (158 lb)   General: anxious, ill but not toxic looking.     Heart: tachy Chest: clear bil.   Abdomen: soft, tender without guard or rebound in LUQ.  BS present.  Soft, not distended,   Extremities: no CCE Neuro/Psych:  Oriented x 3.  Anxious, seems depressed.    Intake/Output from previous day: 07/30 0701 - 07/31 0700 In: 1683.5 [I.V.:1194; NG/GT:489.5] Out: 705 [Urine:525; Chest Tube:180]  Intake/Output this shift: No intake/output data recorded.  Lab Results:  Recent Labs  10/25/15 1320 10/26/15 0348  WBC 10.6* 11.4*  HGB 8.6* 8.8*  HCT 28.2* 29.3*  PLT 241 255   BMET  Recent Labs  10/25/15 1320 10/25/15 1958 10/26/15 0348 10/27/15 0505  NA 138  --  137 138  K 2.9* 3.4* 3.4* 3.4*  CL 104  --  102 104  CO2 27  --  30 27  GLUCOSE 124*  --  107* 149*  BUN 11  --  13 14  CREATININE 0.36*  --  0.38* 0.36*  CALCIUM 8.0*  --  8.4* 8.1*   LFT  Recent Labs  10/25/15 1320  PROT 4.2*  ALBUMIN 1.8*  AST 14*  ALT 15  ALKPHOS 78  BILITOT 0.5   PT/INR No results for input(s): LABPROT, INR in the last 72 hours. Hepatitis Panel No results for input(s): HEPBSAG, HCVAB, HEPAIGM, HEPBIGM in the last 72 hours.  Studies/Results: Mr Abdomen Mrcp Wo Cm  Result  Date: 10/26/2015 CLINICAL DATA:  Acute pancreatitis and history of pseudocysts. EXAM: MRI ABDOMEN WITHOUT CONTRAST  (INCLUDING MRCP) TECHNIQUE: Multiplanar multisequence MR imaging of the abdomen was performed. Heavily T2-weighted images of the biliary and pancreatic ducts were obtained, and three-dimensional MRCP images were rendered by post processing. COMPARISON:  CT scan 10/19/2015 FINDINGS: Examination is quite limited due to respiratory motion. The lungs demonstrate severe lung disease along with the pleural effusions. The heart is mildly enlarged. Benign-appearing hepatic cysts are again demonstrated. No worrisome hepatic lesions or intrahepatic biliary dilatation. The gallbladder surgically absent. Normal caliber and course of the common bile duct. No obvious common bile ducts stones. The pancreatic duct is also normal in caliber and has a normal course. No pancreatic divisum. In the midbody region of the pancreas I do not see the pancreatic duct. I assume it is mildly compressed by acute inflammation/ pancreatitis. I do not see an obvious mass and I do not see any mass lesion the prior CT scans. There is fluid around the pancreas consistent with pancreatitis. Small pseudocysts are again noted. No mesenteric or retroperitoneal mass or adenopathy. IMPRESSION: 1. Very limited study due to respiratory motion. 2. Normal caliber and  course of the common bile duct. No common bile duct stones. 3. Findings consistent with pancreatitis. No mass and no evidence of pancreatic divisum. 4. Stable hepatic cysts. 5. Extensive bilateral lung disease and effusions. Electronically Signed   By: Marijo Sanes M.D.   On: 10/26/2015 13:48  Dg Abd Portable 2v  Result Date: 10/26/2015 CLINICAL DATA:  Abdominal distention, pain. EXAM: PORTABLE ABDOMEN - 2 VIEW COMPARISON:  10/24/2015. FINDINGS: Enteric feeding tube lies within the small bowel. RIGHT chest tube is observed, with basilar pneumothorax, roughly stable from visible  priors. Lateral decubitus radiograph demonstrates no free air over the liver. Bowel gas pattern is nonobstructive. IMPRESSION: No acute intra-abdominal findings. RIGHT basilar pneumothorax. Electronically Signed   By: Staci Righter M.D.   On: 10/26/2015 07:52  Scheduled Meds: . aspirin  81 mg Oral Daily  . bisacodyl  10 mg Oral Daily  . budesonide (PULMICORT) nebulizer solution  0.25 mg Nebulization BID  . chlorhexidine  15 mL Mouth Rinse BID  . enoxaparin (LOVENOX) injection  40 mg Subcutaneous Q24H  . famotidine  20 mg Oral Daily  . feeding supplement (PRO-STAT SUGAR FREE 64)  30 mL Per Tube BID  . fentaNYL   Intravenous Q4H  . levalbuterol  0.63 mg Nebulization Q6H  . LORazepam  1 mg Intravenous Q6H  . metoprolol  5 mg Intravenous Q6H  . multivitamin with minerals  1 tablet Oral Daily  . nicotine  21 mg Transdermal Daily  . sertraline  25 mg Oral Daily  . simethicone  80 mg Oral QID  . sodium chloride flush  10-40 mL Intracatheter Q12H  . sodium chloride flush  3 mL Intravenous Q12H  . sodium chloride flush  3 mL Intravenous Q12H   Continuous Infusions: . dextrose 5 % and 0.9% NaCl 80 mL/hr at 10/27/15 0000  . feeding supplement (VITAL AF 1.2 CAL) 1,000 mL (10/27/15 0533)   PRN Meds:.acetaminophen **OR** acetaminophen, alum & mag hydroxide-simeth, diphenhydrAMINE **OR** diphenhydrAMINE, naloxone **AND** sodium chloride flush, ondansetron **OR** ondansetron (ZOFRAN) IV, ondansetron (ZOFRAN) IV, phenol, polyethylene glycol, potassium chloride, sodium chloride flush   ASSESMENT:   *  Subacute pancreatitis with peripancreatic fluid collections with LUQ pain.  Pancreatitis and small pseudocysts per MRCP.  No pancreatic mass confirmed (seen on CT) though CA 19-9 535, no ductal dilatation.  Unclear cause of pancreatitis, ? Related to Wegeners?    *  PCM. Abd pain making TF intolerable.     *  Anxiety. Day 2 Zoloft.  Ongoing Lorazepam. Appreciated Dr Parke Poisson' input.  So far not much  improvement, pt having particularly bad last 24 hours.  Note that surgical PA just upped Xanax to q 6 hours.   *  Hypokalemia.   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  On Prednisone PTA, not sure when last had steroids, but not for > 10 days.   *   Macrocytic anemia.    PLAN   *  Continue psych care.  *  To ease her mind, will decrease rate to 40 until HS tonight.  Then increase back to 60 ml per hour.   *  Might want to get palliative care involved for input re anxiety, pain mgt.    *  IgG 4 ordered (AM draw) per sugg of Dr Annamary Rummage  10/27/2015, 9:05 AM  Pager: [phone]

## 2015-10-27 NOTE — Care Management Note (Signed)
Case Management Note  Patient Details  Name: [name] [name] MRN: [id number] Date of Birth: 10-23-1945  Subjective/Objective:    S/P VATS, Drainage of Pl. Effusion, pancreatitis, Cortrak tube in place, chest tube to suction,  on going air leak, fentanyl pca. c/o difficulty breathing, on 2 liters oxygen [name] , has constant abd pain, Ativan  iv q6, Lopressor iv q6, IVk runs x 2 , ivf at 80.  Patient active with [facility] for HHRN and HHPT , NCM will cont to follow for dc needs.                 Action/Plan:   Expected Discharge Date:                  Expected Discharge Plan:  [place]  In-House Referral:     Discharge planning Services  CM Consult  Post Acute Care Choice:  Resumption of Svcs/PTA Provider Choice offered to:  Patient  DME Arranged:  Oxygen DME Agency:  [facility] (home oxygen)  HH Arranged:  RN, PT HH Agency:     Status of Service:  In process, will continue to follow  If discussed at Long Length of Stay Meetings, dates discussed:    Additional Comments:  Zenon Mayo, RN 10/27/2015, 2:50 PM

## 2015-10-27 NOTE — Progress Notes (Signed)
Name: [name] [name] MRN: [id number] DOB: [birth year]    ADMISSION DATE:  10/20/2015  CHIEF COMPLAINT:  Dyspnea  BRIEF PATIENT DESCRIPTION: 70 year old female with R hydropneumothorax admitted from office for CVTS evaluation.  STUDIES:  09/16/15 TTE: Normal LV size with mild LV hypertrophy, EF 65-70%. Normal RV size and systolic function. No significant valvular abnormalities. 7/07 CT Chest: moderate R hydropneumothorax, abnormal pleural thickening with scattered areas of pleural fluid in the R hemithorax, additional subpleural reticulation / fibrosis with interlobular septal thickening R>L, fluid along the pancreatic body/tail related to known acute pancreatitis 7/03 Cytology from R [facility]: acute inflammation, no malignant cells 7/07 Cytology from R [facility]: no malignant cells    MICROBIOLOGY:  Urine Ctx 7/2:  Recommended recollection Urine Ctx 7/3:  Recommended recollection R Pleural Fluid 7/3:  Negative R Pleural Fluid 7/6:  Negative MRSA PCR 7/20:  Negative R Pleural Fluid 7/21> neg gm stain>> R Parietal Pleural Biopsy 7/21> neg   ANTIBIOTICS: Cefuroxime 7/20 - 7/21 (surgical prophylaxis)   SUBJECTIVE:   Continues to be very anxious.  CT with still airleak  VITAL SIGNS:   Temp:  [97.7 F (36.5 C)-98.3 F (36.8 C)] 97.7 F (36.5 C) (07/31 0720) Pulse Rate:  [102-122] 117 (07/31 0714) Resp:  [21-33] 30 (07/31 0828) BP: (112-141)/(70-81) 141/72 (07/31 0714) SpO2:  [90 %-97 %] 97 % (07/31 0828) Weight:  [158 lb (71.7 kg)] 158 lb (71.7 kg) (07/31 0500)   Physical Exam: General: Elderly female, anxious, mild distress Neurological:  No focal deficits HEENT: Moist mucus membranes. No scleral injection or icterus. Pupils symmetric.  Cardiac: S1,S2, RRR, no MRG Pulmonary: Clear, no wheeze or crackles. Right chest tube in place Abdomen:  Soft. NT/ (-) masses. Tenderness in LUQ and RUQ Integument:  Warm & dry. No rash on exposed skin.   Recent Labs Lab  10/25/15 1320 10/25/15 1958 10/26/15 0348 10/27/15 0505  NA 138  --  137 138  K 2.9* 3.4* 3.4* 3.4*  CL 104  --  102 104  CO2 27  --  30 27  BUN 11  --  13 14  CREATININE 0.36*  --  0.38* 0.36*  GLUCOSE 124*  --  107* 149*    Recent Labs Lab 10/21/15 0430 10/22/15 0410 10/25/15 1320 10/26/15 0348  HGB 9.5*  --  8.6* 8.8*  HCT 30.5* 27.5* 28.2* 29.3*  WBC 11.6*  --  10.6* 11.4*  PLT 249  --  241 255   Mr Abdomen Mrcp Wo Cm  Result Date: 10/26/2015 CLINICAL DATA:  Acute pancreatitis and history of pseudocysts. EXAM: MRI ABDOMEN WITHOUT CONTRAST  (INCLUDING MRCP) TECHNIQUE: Multiplanar multisequence MR imaging of the abdomen was performed. Heavily T2-weighted images of the biliary and pancreatic ducts were obtained, and three-dimensional MRCP images were rendered by post processing. COMPARISON:  CT scan 10/19/2015 FINDINGS: Examination is quite limited due to respiratory motion. The lungs demonstrate severe lung disease along with the pleural effusions. The heart is mildly enlarged. Benign-appearing hepatic cysts are again demonstrated. No worrisome hepatic lesions or intrahepatic biliary dilatation. The gallbladder surgically absent. Normal caliber and course of the common bile duct. No obvious common bile ducts stones. The pancreatic duct is also normal in caliber and has a normal course. No pancreatic divisum. In the midbody region of the pancreas I do not see the pancreatic duct. I assume it is mildly compressed by acute inflammation/ pancreatitis. I do not see an obvious mass and I do not see any mass  lesion the prior CT scans. There is fluid around the pancreas consistent with pancreatitis. Small pseudocysts are again noted. No mesenteric or retroperitoneal mass or adenopathy. IMPRESSION: 1. Very limited study due to respiratory motion. 2. Normal caliber and course of the common bile duct. No common bile duct stones. 3. Findings consistent with pancreatitis. No mass and no evidence of  pancreatic divisum. 4. Stable hepatic cysts. 5. Extensive bilateral lung disease and effusions. Electronically Signed   By: Marijo Sanes M.D.   On: 10/26/2015 13:48  Dg Abd Portable 2v  Result Date: 10/26/2015 CLINICAL DATA:  Abdominal distention, pain. EXAM: PORTABLE ABDOMEN - 2 VIEW COMPARISON:  10/24/2015. FINDINGS: Enteric feeding tube lies within the small bowel. RIGHT chest tube is observed, with basilar pneumothorax, roughly stable from visible priors. Lateral decubitus radiograph demonstrates no free air over the liver. Bowel gas pattern is nonobstructive. IMPRESSION: No acute intra-abdominal findings. RIGHT basilar pneumothorax. Electronically Signed   By: Staci Righter M.D.   On: 10/26/2015 07:52  ASSESSMENT / PLAN:  70 year old female with recurrent right exudative pleural effusion and acute on chronic pancreatitis with pseudocyst formation. Patient underwent VATS 7/21 c/w empyema with culture and parietal pleural biopsy. Successfully extubated post-operative and transferred to the stepdown unit for further care.   Recurrent Right Exudative Pleural Effusion c/w empyema    S/P VATS 7/21 .  - Following culture and pathology from VATS > no official patgology report seen in EPIC. The prelim report is probably cancer but we are awaiting the final result before informing the patient. Daughter was informed about the prelim read on 7/26.  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   Asthma vs COPD:  No signs of exacerbation.   - Continuing Breo in place of home Dulera.  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 Concern for cystic neoplasm (Pancreas) - GI following > eventual MRI/MRCP vs EUS as an oupt per GI. - tolerating TF - Continuing issues with abdominal pain.   Possible lytic  lesion in R acetabulum: Not seen in previous CT scan or PET scan. Follow the pending path results  Coronary Artery Disease and  HTN:   - Continuing ASA 81mg  daily - Clonidine   Anxiety/Depression -Standing lorazepam -Zoloft  Tobacco abuse disorder - Cessation counseling prior to discharge  Marshell Garfinkel MD [facility] Pager [phone] If no answer or after 3pm call: [phone] 10/27/2015, 10:06 AM

## 2015-10-28 ENCOUNTER — Inpatient Hospital Stay (HOSPITAL_COMMUNITY): Payer: Medicare Other

## 2015-10-28 DIAGNOSIS — F411 Generalized anxiety disorder: Secondary | ICD-10-CM | POA: Diagnosis present

## 2015-10-28 DIAGNOSIS — C799 Secondary malignant neoplasm of unspecified site: Secondary | ICD-10-CM | POA: Diagnosis present

## 2015-10-28 DIAGNOSIS — R140 Abdominal distension (gaseous): Secondary | ICD-10-CM | POA: Diagnosis present

## 2015-10-28 DIAGNOSIS — I776 Arteritis, unspecified: Secondary | ICD-10-CM

## 2015-10-28 DIAGNOSIS — Z789 Other specified health status: Secondary | ICD-10-CM

## 2015-10-28 DIAGNOSIS — R000 Tachycardia, unspecified: Secondary | ICD-10-CM | POA: Diagnosis present

## 2015-10-28 LAB — BASIC METABOLIC PANEL
ANION GAP: 6 (ref 5–15)
BUN: 10 mg/dL (ref 6–20)
CALCIUM: 7.8 mg/dL — AB (ref 8.9–10.3)
CO2: 28 mmol/L (ref 22–32)
Chloride: 104 mmol/L (ref 101–111)
Creatinine, Ser: 0.31 mg/dL — ABNORMAL LOW (ref 0.44–1.00)
Glucose, Bld: 160 mg/dL — ABNORMAL HIGH (ref 65–99)
Potassium: 3.3 mmol/L — ABNORMAL LOW (ref 3.5–5.1)
Sodium: 138 mmol/L (ref 135–145)

## 2015-10-28 LAB — BLOOD GAS, ARTERIAL
Acid-Base Excess: 5.4 mmol/L — ABNORMAL HIGH (ref 0.0–2.0)
Bicarbonate: 30.1 mEq/L — ABNORMAL HIGH (ref 20.0–24.0)
Drawn by: 362771
FIO2: 0.36
O2 CONTENT: 4 L/min
O2 Saturation: 91 %
PCO2 ART: 49.7 mmHg — AB (ref 35.0–45.0)
PH ART: 7.399 (ref 7.350–7.450)
Patient temperature: 98.6
TCO2: 31.6 mmol/L (ref 0–100)
pO2, Arterial: 64 mmHg — ABNORMAL LOW (ref 80.0–100.0)

## 2015-10-28 LAB — GLUCOSE, CAPILLARY
GLUCOSE-CAPILLARY: 130 mg/dL — AB (ref 65–99)
GLUCOSE-CAPILLARY: 113 mg/dL — AB (ref 65–99)
Glucose-Capillary: 131 mg/dL — ABNORMAL HIGH (ref 65–99)
Glucose-Capillary: 158 mg/dL — ABNORMAL HIGH (ref 65–99)
Glucose-Capillary: 126 mg/dL — ABNORMAL HIGH (ref 65–99)
Glucose-Capillary: 121 mg/dL — ABNORMAL HIGH (ref 65–99)

## 2015-10-28 LAB — MAGNESIUM: Magnesium: 1.7 mg/dL (ref 1.7–2.4)

## 2015-10-28 MED ORDER — POTASSIUM CHLORIDE 10 MEQ/100ML IV SOLN
10.0000 meq | INTRAVENOUS | Status: AC
  Administered 2015-10-28 (×4): 10 meq via INTRAVENOUS
  Filled 2015-10-28 (×4): qty 100

## 2015-10-28 MED ORDER — MAGNESIUM SULFATE IN D5W 1-5 GM/100ML-% IV SOLN
1.0000 g | Freq: Once | INTRAVENOUS | Status: AC
  Administered 2015-10-28: 1 g via INTRAVENOUS
  Filled 2015-10-28: qty 100

## 2015-10-28 MED ORDER — LORAZEPAM 2 MG/ML IJ SOLN
2.0000 mg | INTRAMUSCULAR | Status: DC | PRN
  Administered 2015-10-28: 2 mg via INTRAVENOUS
  Filled 2015-10-28: qty 1

## 2015-10-28 MED ORDER — LORAZEPAM 2 MG/ML IJ SOLN
0.5000 mg | Freq: Once | INTRAMUSCULAR | Status: AC
  Administered 2015-10-28: 0.5 mg via INTRAVENOUS
  Filled 2015-10-28: qty 1

## 2015-10-28 MED ORDER — LORAZEPAM 2 MG/ML IJ SOLN
2.0000 mg | Freq: Four times a day (QID) | INTRAMUSCULAR | Status: DC | PRN
  Administered 2015-10-28 – 2015-10-29 (×2): 2 mg via INTRAVENOUS
  Filled 2015-10-28 (×2): qty 1

## 2015-10-28 NOTE — Progress Notes (Signed)
   [facility]  I spoke with pathologist last Wednesday to f/u on the surgical path on the RLL specimen. He said "he is (likely) seeing carcinoma" in the RLL and he needs to do more stains. Nothing definite. Not sure what primary is.  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 That LLL mass has since disappeared in recent chest ct scan. Pt has also gotten prednsione and Rituximab for (possible) vasculitis.   I just mentioned this to the daughter Ginger Lia Hopping now over the phone. Pt does NOT know anything re: CA diagnosis.   Awaiting final Pathology on Right lower lung specimen.    Monica Becton, MD 10/28/2015, 7:08 AM [facility] Pager [phone] After 3 pm or if no answer, call [phone]

## 2015-10-28 NOTE — Progress Notes (Addendum)
[address]       [place] [address]             [phone]      11 Days Post-Op Procedure(s) (LRB): VIDEO ASSISTED THORACOSCOPY (VATS) with Drainage of Pleural Effusion (Right) DRAINAGE OF PLEURAL EFFUSION (Right) Subjective: Anxious and c/o of nausea, moaning  Objective: Vital signs in last 24 hours: Temp:  [97.3 F (36.3 C)-98.6 F (37 C)] 97.3 F (36.3 C) (08/01 0815) Pulse Rate:  [120-125] 125 (08/01 0815) Cardiac Rhythm: Sinus tachycardia (08/01 0700) Resp:  [21-29] 26 (08/01 0815) BP: (133-147)/(51-91) 137/64 (08/01 0815) SpO2:  [92 %-97 %] 95 % (08/01 0815) FiO2 (%):  [28 %] 28 % (08/01 0000) Weight:  [157 lb (71.2 kg)-157 lb 10.1 oz (71.5 kg)] 157 lb 10.1 oz (71.5 kg) (08/01 0500)  Hemodynamic parameters for last 24 hours:    Intake/Output from previous day: 07/31 0701 - 08/01 0700 In: 1597.3 [I.V.:1337.3; NG/GT:60; IV Piggyback:200] Out: 1045 [Urine:875; Chest Tube:170] Intake/Output this shift: No intake/output data recorded.  General appearance: alert, distracted and mild distress Heart: regular rate and rhythm and tachy Lungs: dim in bases Abdomen: ? ttp Extremities: no edema Wound: ok  Lab Results:  Recent Labs  10/25/15 1320 10/25/15 1625 10/26/15 0348  WBC 10.6*  --  11.4*  HGB 8.6*  --  8.8*  HCT 28.2* 27.5* 29.3*  PLT 241  --  255   BMET:  Recent Labs  10/27/15 0505 10/28/15 0500  NA 138 138  K 3.4* 3.3*  CL 104 104  CO2 27 28  GLUCOSE 149* 160*  BUN 14 10  CREATININE 0.36* 0.31*  CALCIUM 8.1* 7.8*    PT/INR: No results for input(s): LABPROT, INR in the last 72 hours. ABG    Component Value Date/Time   PHART 7.399 10/28/2015 0339   HCO3 30.1 (H) 10/28/2015 0339   TCO2 31.6 10/28/2015 0339   O2SAT 91.0 10/28/2015 0339   CBG (last 3)   Recent Labs  10/27/15 2350 10/28/15 0319 10/28/15 0810  GLUCAP 142* 131* 158*    Meds Scheduled Meds: . aspirin  81 mg Oral Daily  . bisacodyl  10 mg  Oral Daily  . budesonide (PULMICORT) nebulizer solution  0.25 mg Nebulization BID  . chlorhexidine  15 mL Mouth Rinse BID  . enoxaparin (LOVENOX) injection  40 mg Subcutaneous Q24H  . famotidine  20 mg Oral Daily  . feeding supplement (PRO-STAT SUGAR FREE 64)  30 mL Per Tube BID  . fentaNYL   Intravenous Q4H  . levalbuterol  0.63 mg Nebulization Q6H  . LORazepam  1 mg Intravenous Q6H  . metoprolol  5 mg Intravenous Q6H  . multivitamin with minerals  1 tablet Oral Daily  . nicotine  21 mg Transdermal Daily  . sertraline  25 mg Oral Daily  . simethicone  80 mg Oral QID  . sodium chloride flush  10-40 mL Intracatheter Q12H  . sodium chloride flush  3 mL Intravenous Q12H  . sodium chloride flush  3 mL Intravenous Q12H   Continuous Infusions: . dextrose 5 % and 0.9% NaCl 80 mL/hr at 10/27/15 2256  . feeding supplement (VITAL AF 1.2 CAL) 1,000 mL (10/27/15 2256)   PRN Meds:.acetaminophen **OR** acetaminophen, alum & mag hydroxide-simeth, diphenhydrAMINE **OR** diphenhydrAMINE, naloxone **AND** sodium chloride flush, ondansetron **OR** ondansetron (ZOFRAN) IV, ondansetron (ZOFRAN) IV, phenol, polyethylene glycol, potassium chloride, sodium chloride flush  Xrays Mr Abdomen Mrcp Wo Cm  Result  Date: 10/26/2015 CLINICAL DATA:  Acute pancreatitis and history of pseudocysts. EXAM: MRI ABDOMEN WITHOUT CONTRAST  (INCLUDING MRCP) TECHNIQUE: Multiplanar multisequence MR imaging of the abdomen was performed. Heavily T2-weighted images of the biliary and pancreatic ducts were obtained, and three-dimensional MRCP images were rendered by post processing. COMPARISON:  CT scan 10/19/2015 FINDINGS: Examination is quite limited due to respiratory motion. The lungs demonstrate severe lung disease along with the pleural effusions. The heart is mildly enlarged. Benign-appearing hepatic cysts are again demonstrated. No worrisome hepatic lesions or intrahepatic biliary dilatation. The gallbladder surgically absent.  Normal caliber and course of the common bile duct. No obvious common bile ducts stones. The pancreatic duct is also normal in caliber and has a normal course. No pancreatic divisum. In the midbody region of the pancreas I do not see the pancreatic duct. I assume it is mildly compressed by acute inflammation/ pancreatitis. I do not see an obvious mass and I do not see any mass lesion the prior CT scans. There is fluid around the pancreas consistent with pancreatitis. Small pseudocysts are again noted. No mesenteric or retroperitoneal mass or adenopathy. IMPRESSION: 1. Very limited study due to respiratory motion. 2. Normal caliber and course of the common bile duct. No common bile duct stones. 3. Findings consistent with pancreatitis. No mass and no evidence of pancreatic divisum. 4. Stable hepatic cysts. 5. Extensive bilateral lung disease and effusions. Electronically Signed   By: Marijo Sanes M.D.   On: 10/26/2015 13:48  Dg Chest Port 1 View  Result Date: 10/28/2015 CLINICAL DATA:  Respiratory failure. Shortness of breath. Question increase in pneumothorax. EXAM: PORTABLE CHEST 1 VIEW COMPARISON:  10/25/2015 FINDINGS: Enteric tube remains in place, tip below the diaphragm not included in the field of view. Tip of the right central line in the mid SVC. Right chest tube remains in place directed towards the lung apex. Small primarily inferior right pneumothorax is unchanged from prior. Persistent asymmetric opacity with right increase compared to left, unchanged. Left pleural effusion and atelectasis unchanged. The mediastinal contours are unchanged. IMPRESSION: 1. Unchanged small right inferior pneumothorax with right chest tube in place. No increased size of the pneumothorax compared to prior exams. 2. Unchanged left pleural effusion and basilar opacity. 3. Unchanged diffuse interstitial prominence of the right hemithorax. Electronically Signed   By: Jeb Levering M.D.   On: 10/28/2015 03:48     Assessment/Plan: S/P Procedure(s) (LRB): VIDEO ASSISTED THORACOSCOPY (VATS) with Drainage of Pleural Effusion (Right) DRAINAGE OF PLEURAL EFFUSION (Right)  1 stable but anxious and agitated so difficult to examine as she primarily just moans her responses 2 small air leak- kep CT 3 path pending 4 GI/Pulm medicine assisting with medical management   LOS: 15 days    GOLD,WAYNE E 10/28/2015  Chart reviewed, patient examined, agree with above. On CXR the tube looks like it pulled back a little and the most proximal hole is outside the chest wall in the subcutaneous tissue. There is a small air leak with cough. Continue chest tube to water seal.

## 2015-10-28 NOTE — Progress Notes (Deleted)
HPI: FU coronary disease. Cardiac CTA in April of 2013 revealed: Normal LM, 25-50% LAD, nonocclusive plaque in the circumflex. Calcium score 42. Nuclear study 11/14 showed EF 90 and normal perfusion. Last echocardiogram June 2017 showed normal LV systolic function, grade 1 diastolic dysfunction. Seen recently for palpitations and metoprolol increased. Had VATS 7/17 for empyema. Since I last saw her,   No current facility-administered medications for this visit.    No current outpatient prescriptions on file.   Facility-Administered Medications Ordered in Other Visits  Medication Dose Route Frequency Provider Last Rate Last Dose  . acetaminophen (TYLENOL) tablet 650 mg  650 mg Oral Q6H PRN Magdalen Spatz, NP   650 mg at 10/26/15 2051   Or  . acetaminophen (TYLENOL) suppository 650 mg  650 mg Rectal Q6H PRN Magdalen Spatz, NP      . alum & mag hydroxide-simeth (MAALOX/MYLANTA) 200-200-20 MG/5ML suspension 30 mL  30 mL Oral Q4H PRN Gaye Pollack, MD   30 mL at 10/27/15 2005  . aspirin chewable tablet 81 mg  81 mg Oral Daily Gaye Pollack, MD   81 mg at 10/28/15 [id number]  . bisacodyl (DULCOLAX) EC tablet 10 mg  10 mg Oral Daily Gaye Pollack, MD   10 mg at 10/28/15 [id number]  . budesonide (PULMICORT) nebulizer solution 0.25 mg  0.25 mg Nebulization BID Allie Bossier, MD   0.25 mg at 10/28/15 0910  . chlorhexidine (PERIDEX) 0.12 % solution 15 mL  15 mL Mouth Rinse BID Gaye Pollack, MD   15 mL at 10/28/15 0814  . dextrose 5 %-0.9 % sodium chloride infusion   Intravenous Continuous Wayne E Gold, PA-C      . diphenhydrAMINE (BENADRYL) injection 12.5 mg  12.5 mg Intravenous Q6H PRN Jessica D Zehr, PA-C       Or  . diphenhydrAMINE (BENADRYL) 12.5 MG/5ML elixir 12.5 mg  12.5 mg Oral Q6H PRN Jessica D Zehr, PA-C      . enoxaparin (LOVENOX) injection 40 mg  40 mg Subcutaneous Q24H Erin R Barrett, PA-C   40 mg at 10/28/15 0808  . famotidine (PEPCID) tablet 20 mg  20 mg Oral Daily Magdalen Spatz, NP   20  mg at 10/28/15 0813  . feeding supplement (PRO-STAT SUGAR FREE 64) liquid 30 mL  30 mL Per Tube BID Gaye Pollack, MD   30 mL at 10/28/15 0811  . feeding supplement (VITAL AF 1.2 CAL) liquid 1,000 mL  1,000 mL Per Tube Continuous Vena Rua, PA-C   1,000 mL at 10/27/15 2256  . fentaNYL 40 mcg/mL PCA injection   Intravenous Q4H Jessica D Zehr, PA-C      . levalbuterol (XOPENEX) nebulizer solution 0.63 mg  0.63 mg Nebulization Q6H Erin R Barrett, PA-C   0.63 mg at 10/28/15 1529  . LORazepam (ATIVAN) injection 2 mg  2 mg Intravenous Q4H PRN Allie Bossier, MD   2 mg at 10/28/15 1208  . metoprolol (LOPRESSOR) injection 5 mg  5 mg Intravenous Q6H Allie Bossier, MD   5 mg at 10/28/15 1208  . multivitamin with minerals tablet 1 tablet  1 tablet Oral Daily Alexander, MD   1 tablet at 10/28/15 0813  . naloxone [facility]) injection 0.4 mg  0.4 mg Intravenous PRN Laban Emperor Zehr, PA-C       And  . sodium chloride flush (NS) 0.9 % injection 9 mL  9 mL  Intravenous PRN Laban Emperor Zehr, PA-C      . nicotine (NICODERM CQ - dosed in mg/24 hours) patch 21 mg  21 mg Transdermal Daily Allie Bossier, MD   21 mg at 10/27/15 1027  . ondansetron (ZOFRAN) tablet 4 mg  4 mg Oral Q6H PRN Magdalen Spatz, NP       Or  . ondansetron [facility]) injection 4 mg  4 mg Intravenous Q6H PRN Magdalen Spatz, NP   4 mg at 10/22/15 1640  . ondansetron (ZOFRAN) injection 4 mg  4 mg Intravenous Q6H PRN Laban Emperor Zehr, PA-C   4 mg at 10/28/15 0849  . phenol (CHLORASEPTIC) mouth spray 2 spray  2 spray Mouth/Throat PRN Vena Rua, PA-C   2 spray at 10/22/15 1118  . polyethylene glycol (MIRALAX / GLYCOLAX) packet 17 g  17 g Oral Daily PRN Magdalen Spatz, NP      . potassium chloride 10 mEq in 50 mL *CENTRAL LINE* IVPB  10 mEq Intravenous Daily PRN Gaye Pollack, MD   10 mEq at 10/27/15 1038  . sertraline (ZOLOFT) tablet 25 mg  25 mg Oral Daily Jerene Bears, MD   25 mg at 10/28/15 [id number]  . simethicone (MYLICON) chewable tablet  80 mg  80 mg Oral QID Tanda Rockers, MD   80 mg at 10/28/15 1338  . sodium chloride flush (NS) 0.9 % injection 10-40 mL  10-40 mL Intracatheter Q12H Gaye Pollack, MD   30 mL at 10/27/15 2200  . sodium chloride flush (NS) 0.9 % injection 10-40 mL  10-40 mL Intracatheter PRN Gaye Pollack, MD      . sodium chloride flush (NS) 0.9 % injection 3 mL  3 mL Intravenous Q12H Magdalen Spatz, NP   3 mL at 10/27/15 1028  . sodium chloride flush (NS) 0.9 % injection 3 mL  3 mL Intravenous Q12H Magdalen Spatz, NP   3 mL at 10/27/15 1028     Past Medical History:  Diagnosis Date  . Anxiety   . Arthritis    "all over me, I guess" (10/12/2015)  . ASTHMA   . 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 CAD (coronary artery disease)   . 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 Chronic kidney disease   . COPD (chronic obstructive pulmonary disease) with emphysema ([place])    mild dz on PFTs 11/2013  . Depression   . GERD   . 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 H/O hiatal hernia   . Hereditary hemochromatosis ([place]) 2004  . Hx of adenomatous polyp of colon 07/25/2014  . Hyperlipidemia   . HYPERTENSION   . Hypertension   . Migraine    "hx; not anymore" (10/20/2015)  . On home oxygen therapy    "2L; 24/7" (09/27/2015)  . Osteopenia 10/07/2014   DEXA @ [facility] 09/2014: -1.9 L fem  . PONV (postoperative nausea and vomiting)   . Sinus headache   . Urinary tract infection    "recurrent sometimes" (10/08/2015)    Past Surgical History:  Procedure Laterality Date  . ABDOMINAL HYSTERECTOMY  ~ 1983  . APPENDECTOMY  ~ 1983  . BLADDER SUSPENSION  03/29/2002  . COLONOSCOPY W/ BIOPSIES    . CYSTECTOMY     cyst removed from intestines to ovary    . CYSTOSCOPY  01/24/14   NEGATIVE;Dr Ottelin  . DILATION AND CURETTAGE OF UTERUS    . ESOPHAGOGASTRODUODENOSCOPY    .  LAPAROSCOPIC CHOLECYSTECTOMY  ~ 2011  . PLEURAL EFFUSION DRAINAGE Right 10/15/2015   Procedure:  DRAINAGE OF PLEURAL EFFUSION;  Surgeon: Gaye Pollack, MD;  Location: [facility] OR;  Service: Thoracic;  Laterality: Right;  . TENDON RELEASE Left    wrist   . VIDEO ASSISTED THORACOSCOPY (VATS)/THOROCOTOMY Right 10/11/2015   Procedure: VIDEO ASSISTED THORACOSCOPY (VATS) with Drainage of Pleural Effusion;  Surgeon: Gaye Pollack, MD;  Location: [facility] OR;  Service: Thoracic;  Laterality: Right;    Social History   Social History  . Marital status: Widowed    Spouse name: N/A  . Number of children: 2  . Years of education: N/A   Occupational History  . 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    Social History Main Topics  . Smoking status: Former Smoker    Packs/day: 0.50    Years: 54.00    Types: Cigarettes    Quit date: 08/28/2015  . Smokeless tobacco: Never Used  . Alcohol use No  . Drug use: No  . Sexual activity: No   Other Topics Concern  . Not on file   Social History Narrative   widowed summer 2012, lives alone. Retired from work in [employer] History  Problem Relation Age of Onset  . Hypertension Mother   . Heart disease Mother   . Breast cancer Mother 67  . Diabetes Father   . Heart disease Father   . Alcohol abuse Other   . Arthritis Other   . Cirrhosis Brother   . Cirrhosis Brother   . Cirrhosis Brother   . Lung cancer Brother   . Colon cancer Neg Hx   . Colon polyps Neg Hx   . Esophageal cancer Neg Hx   . Rectal cancer Neg Hx   . Stomach cancer Neg Hx     ROS: no fevers or chills, productive cough, hemoptysis, dysphasia, odynophagia, melena, hematochezia, dysuria, hematuria, rash, seizure activity, orthopnea, PND, pedal edema, claudication. Remaining systems are negative.  Physical Exam: Well-developed well-nourished in no acute distress.  Skin is warm and dry.  HEENT is normal.  Neck is supple.  Chest is clear to auscultation with normal expansion.  Cardiovascular exam is regular rate and rhythm.    Abdominal exam nontender or distended. No masses palpated. Extremities show no edema. neuro grossly intact  ECG

## 2015-10-28 NOTE — Consult Note (Signed)
Medical Consultation   [name] [name]  OIT:[id number]  DOB: [birth year]  DOA: 10/27/2015  PCP: [name], MD    Requesting physician: Dr.Jay M [facility]  Reason for consultation: Multisystem organ failure   History of Present Illness: [name] [name] is an 70 y.o. WF PMHx Depression, Anxiety COPD, Tobacco abuse, RLL lung cavitary mass Recurrent Enlarging Rt Hydropneumothorax S/P:right thoracentesis 7/3 removing 1.4 L Exudative fluid/.Repeat thoracentesis on 7/6 removed 1L of the same fluid . 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 and a recent admission due to acute pancreatitis with pseudocyst.   07/24/2015 she underwent limited autoimmune workup that showed screening p-ANCA positive (no c-anca, pr3 or mpo avail). Then 08/06/15 she had full panel with Dr Trudie Reed and per phone call PR-3 and MPO antibody results were NEGATIVE and only P-ANCA was trace posiotive. 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 Patient then committed to high-dose prednisone and also Rituxan once a week for 4 weeks infusion. She completed 3 weeks of this infusion he did she was supposed to get her fourth dose a little 10/01/2015 on 10/24/2015 but she missed it because of illness.  She believes ever since starting high-dose prednisone she started developing abdominal pain. A CT abdomen 09/23/2015 showed the right lower lobe mass at 2.6 cm perhaps smaller but obscured by a right pleural effusion that was small. But she had acute pancreatitis features with small pseudocyst. She blames the high-dose prednisone for her current problems. She does not think Rituxan is the causative agent for this.   She reports she started developing abdominal pain and finally was admitted 09/28/2015. Since admission the right pleural effusion got worse and she had a thoracentesis 09/29/2015 that showed idiopathic exudate with acute inflammatory  cells and mesothelial cells with an LDH of 400. Thoracentesis was repeated 10/02/2015 with an LDH of 336. This was complicated by small right basilar pneumothorax on chest x-ray  10/03/2015 which radiology is conservatively following. She was discharged from [facility] on 10/07/15             Review of Systems:  Review of Systems  Constitutional: Positive for malaise/fatigue.  HENT: Positive for sore throat.   Eyes: Negative.   Respiratory: Positive for shortness of breath and wheezing.   Cardiovascular: Positive for chest pain and leg swelling.  Gastrointestinal: Positive for abdominal pain and diarrhea.  Genitourinary: Positive for flank pain.  Musculoskeletal: Positive for back pain and neck pain.  Skin: Negative.   Neurological: Positive for weakness.  Psychiatric/Behavioral: Positive for depression.     Past Medical History: Past Medical History:  Diagnosis Date  . Anxiety   . Arthritis    "all over me, I guess" (10/21/2015)  . ASTHMA   . 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 CAD (coronary artery disease)   . 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 Chronic kidney disease   . COPD (chronic obstructive pulmonary disease) with emphysema ([place])    mild dz on PFTs 11/2013  . Depression   . GERD   . 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 H/O hiatal hernia   . 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 Hx of adenomatous polyp of colon 07/25/2014  . Hyperlipidemia   . HYPERTENSION   . Hypertension   . Migraine    "hx; not anymore" (10/15/2015)  . On home oxygen therapy    "  2L; 24/7" (09/30/2015)  . Osteopenia 10/07/2014   DEXA @ [facility] 09/2014: -1.9 L fem  . PONV (postoperative nausea and vomiting)   . Sinus headache   . Urinary tract infection    "recurrent sometimes" (09/28/2015)    Past Surgical History: Past Surgical History:  Procedure Laterality Date  . ABDOMINAL HYSTERECTOMY  ~ 1983  . APPENDECTOMY  ~ 1983    . BLADDER SUSPENSION  03/29/2002  . COLONOSCOPY W/ BIOPSIES    . CYSTECTOMY     cyst removed from intestines to ovary    . CYSTOSCOPY  01/24/14   NEGATIVE;Dr Ottelin  . DILATION AND CURETTAGE OF UTERUS    . ESOPHAGOGASTRODUODENOSCOPY    . LAPAROSCOPIC CHOLECYSTECTOMY  ~ 2011  . PLEURAL EFFUSION DRAINAGE Right 10/12/2015   Procedure: DRAINAGE OF PLEURAL EFFUSION;  Surgeon: Gaye Pollack, MD;  Location: [facility];  Service: Thoracic;  Laterality: Right;  . TENDON RELEASE Left    wrist   . VIDEO ASSISTED THORACOSCOPY (VATS)/THOROCOTOMY Right 10/07/2015   Procedure: VIDEO ASSISTED THORACOSCOPY (VATS) with Drainage of Pleural Effusion;  Surgeon: Gaye Pollack, MD;  Location: [facility];  Service: Thoracic;  Laterality: Right;     Allergies:   Allergies  Allergen Reactions  . Other Other (See Comments)    Please if giving pt anesthesia - she would like to have a scopolamine patch to prevent nausea   . Ciprofloxacin Hives and Other (See Comments)    Blisters on legs  . Codeine Nausea And Vomiting and Other (See Comments)    Sweating, "passes out"  . Macrobid [employer] Macro] Other (See Comments)    Stool incontinence  . Morphine And Related Other (See Comments)    "hospital bed was shaking"  . Prevnar [Pneumococcal 13-Val Conj Vacc] Palpitations and Other (See Comments)    Pt c/o redness, swelling on arm after shot.  Pt c/o having respiratory problems and coughing ever since she got it.      Social History:  reports that she quit smoking about 2 months ago. Her smoking use included Cigarettes. She has a 27.00 pack-year smoking history. She has never used smokeless tobacco. She reports that she does not drink alcohol or use drugs.   Family History: Family History  Problem Relation Age of Onset  . Hypertension Mother   . Heart disease Mother   . Breast cancer Mother 81  . Diabetes Father   . Heart disease Father   . Alcohol abuse Other   . Arthritis Other   . Cirrhosis  Brother   . Cirrhosis Brother   . Cirrhosis Brother   . Lung cancer Brother   . Colon cancer Neg Hx   . Colon polyps Neg Hx   . Esophageal cancer Neg Hx   . Rectal cancer Neg Hx   . Stomach cancer Neg Hx      Procedures/Significant Events:  March/2017 CT of the abdomen -3.5 cm RLL lung cavitary mass.  4/10 PET scan;  lesion to be hypermetabolic with an SUV max of 10.0. -Multiple areas of right sided hypermetabolic pleural thickening and nodularity suspicious for metastatic disease. -Lt posterior 7th rib hypermetabolism and probable lucency suspicious for isolated bone met. 4/20 CT guided Biopsy of the RLL lung lesion negative for malignancy, positive vasculitic features. CT of the abdomen on 6/27 - RLL lung mass/ small right pleural effusion.  -Acute pancreatitis with a small pseudocyst    09/16/15 TTE: Normal LV size with mild LV hypertrophy, EF 65-70%. Normal  RV size and systolic function. No significant valvular abnormalities. 7/07 CT Chest: moderate Rt hydropneumothorax, abnormal pleural thickening with scattered areas of pleural fluid in the Rt hemithorax, additional subpleural reticulation / fibrosis with interlobular septal thickening R>L, fluid along the pancreatic body/tail related to known acute pancreatitis 7/03 Cytology from [facility]: acute inflammation, no malignant cells 7/07 Cytology from [facility]: no malignant cells 7/21 S/P right VATS: Loculated Recurrent Right Exudative Pleural Effusion c/w empyema: RIGHT Pleura, biopsy, 2/2 biopsy positive METASTATIC POORLY DIFFERENTIATED CARCINOMA -7/26 GI Note* Pancreatitis of unclear etiology.  7/3 and7/24 CT scans (c/w 09/23/15 CT increased size and # of peripancreatic fluid collections unable to rule out cystic neoplasm. CA-19-9 is 535, normal CEA.  Raises question of cystic neoplasm.   Initial clinical presentation was 08/2015. Previous cholecystectomy 2011. Left abdominal pain improved but persists along with nausea and  anorexia.  Likely needs another PET scan as outpt.      Cultures Urine Ctx 7/2:  Recommended recollection Urine Ctx 7/3:  Recommended recollection R Pleural Fluid 7/3:  Negative R Pleural Fluid 7/6:  Negative MRSA PCR 7/20:  Negative R Pleural Fluid 7/21> neg gm stain>> R Parietal Pleural Biopsy 7/21> neg    Antimicrobials: Cefuroxime 7/20 - 7/21 (surgical prophylaxis)    Devices    LINES / TUBES:  7/21 right chest tube 59F>>    Continuous Infusions: . dextrose 5 % and 0.9% NaCl 80 mL/hr at 10/27/15 2256  . feeding supplement (VITAL AF 1.2 CAL) 1,000 mL (10/27/15 2256)     Physical Exam: Vitals:   10/28/15 1619 10/28/15 1838 10/28/15 1925 10/28/15 1952  BP:   140/84   Pulse:   (!) 117   Resp:  (!) 32 (!) 37   Temp: 97.7 F (36.5 C)  98.1 F (36.7 C)   TempSrc: Oral  Oral   SpO2:  95%  99%  Weight:      Height:        General: Sleepy but arousable and A/O 4, positive acute on chronic respiratory distress Eyes: negative scleral hemorrhage, negative anisocoria, negative icterus ENT: Negative Runny nose, negative gingival bleeding, Neck:  Negative scars, masses, torticollis, lymphadenopathy, JVD, right IJ CVL covered and clean, Lungs: negative breath sounds are LL/RML, positive expiratory wheezing left lung field and RUL, negative, right chest tube in place with serosanguineous fluid + continued air leak Cardiovascular: Tachycardic, Regular rhythm without murmur gallop or rub normal S1 and S2 Abdomen: Positive abdominal pain, positive distention, hypoactive bowel sounds, no rebound, no ascites, no appreciable mass Extremities: No significant cyanosis, clubbing, or edema bilateral lower extremities Skin: Negative rashes, lesions, ulcers Psychiatric:  Negative depression, negative anxiety, negative fatigue, negative mania  Central nervous system:  Cranial nerves II through XII intact, tongue/uvula midline, all extremities muscle strength 5/5, sensation intact  throughout,negative dysarthria, negative expressive aphasia, negative receptive aphasia.  Data reviewed:  I have personally reviewed following labs and imaging studies Labs:  CBC:  Recent Labs Lab 10/22/15 0410 10/25/15 1320 10/25/15 1625 10/26/15 0348  WBC  --  10.6*  --  11.4*  NEUTROABS  --   --   --  7.3  HGB  --  8.6*  --  8.8*  HCT 27.5* 28.2* 27.5* 29.3*  MCV  --  100.7*  --  102.4*  PLT  --  241  --  409    Basic Metabolic Panel:  Recent Labs Lab 10/25/15 1320 10/25/15 1543  10/26/15 0348 10/27/15 0505 10/28/15 0500  NA 138  --   --  137 138 138  K 2.9*  --   < > 3.4* 3.4* 3.3*  CL 104  --   --  102 104 104  CO2 27  --   --  '30 27 28  '$ GLUCOSE 124*  --   --  107* 149* 160*  BUN 11  --   --  '13 14 10  '$ CREATININE 0.36*  --   --  0.38* 0.36* 0.31*  CALCIUM 8.0*  --   --  8.4* 8.1* 7.8*  MG  --  1.9  --   --  1.9 1.7  < > = values in this interval not displayed. GFR Estimated Creatinine Clearance: 62.9 mL/min (by C-G formula based on SCr of 0.8 mg/dL). Liver Function Tests:  Recent Labs Lab 10/25/15 1320  AST 14*  ALT 15  ALKPHOS 78  BILITOT 0.5  PROT 4.2*  ALBUMIN 1.8*    Recent Labs Lab 10/23/15 0312  LIPASE 99*   No results for input(s): AMMONIA in the last 168 hours. Coagulation profile No results for input(s): INR, PROTIME in the last 168 hours.  Cardiac Enzymes: No results for input(s): CKTOTAL, CKMB, CKMBINDEX, TROPONINI in the last 168 hours. BNP: Invalid input(s): POCBNP CBG:  Recent Labs Lab 10/28/15 0319 10/28/15 0810 10/28/15 1153 10/28/15 1617 10/28/15 1927  GLUCAP 131* 158* 126* 121* 130*   D-Dimer No results for input(s): DDIMER in the last 72 hours. Hgb A1c No results for input(s): HGBA1C in the last 72 hours. Lipid Profile No results for input(s): CHOL, HDL, LDLCALC, TRIG, CHOLHDL, LDLDIRECT in the last 72 hours. Thyroid function studies No results for input(s): TSH, T4TOTAL, T3FREE, THYROIDAB in the last 72  hours.  Invalid input(s): FREET3 Anemia work up No results for input(s): VITAMINB12, FOLATE, FERRITIN, TIBC, IRON, RETICCTPCT in the last 72 hours. Urinalysis    Component Value Date/Time   COLORURINE YELLOW 09/29/2015 1148   APPEARANCEUR CLEAR 09/29/2015 1148   LABSPEC 1.007 09/29/2015 1148   PHURINE 5.5 09/29/2015 1148   GLUCOSEU NEGATIVE 09/29/2015 1148   GLUCOSEU NEGATIVE 07/23/2015 0901   HGBUR SMALL (A) 09/29/2015 1148   BILIRUBINUR NEGATIVE 09/29/2015 1148   BILIRUBINUR 3+ 05/21/2015 0932   KETONESUR NEGATIVE 09/29/2015 1148   PROTEINUR NEGATIVE 09/29/2015 1148   UROBILINOGEN 0.2 07/23/2015 0901   NITRITE NEGATIVE 09/29/2015 1148   LEUKOCYTESUR MODERATE (A) 09/29/2015 1148     Microbiology No results found for this or any previous visit (from the past 240 hour(s)).     Inpatient Medications:   Scheduled Meds: . aspirin  81 mg Oral Daily  . bisacodyl  10 mg Oral Daily  . budesonide (PULMICORT) nebulizer solution  0.25 mg Nebulization BID  . chlorhexidine  15 mL Mouth Rinse BID  . enoxaparin (LOVENOX) injection  40 mg Subcutaneous Q24H  . famotidine  20 mg Oral Daily  . feeding supplement (PRO-STAT SUGAR FREE 64)  30 mL Per Tube BID  . fentaNYL   Intravenous Q4H  . levalbuterol  0.63 mg Nebulization Q6H  . metoprolol  5 mg Intravenous Q6H  . multivitamin with minerals  1 tablet Oral Daily  . nicotine  21 mg Transdermal Daily  . sertraline  25 mg Oral Daily  . simethicone  80 mg Oral QID  . sodium chloride flush  10-40 mL Intracatheter Q12H  . sodium chloride flush  3 mL Intravenous Q12H  . sodium chloride flush  3 mL Intravenous Q12H   Continuous Infusions: . dextrose 5 % and 0.9% NaCl 80  mL/hr at 10/27/15 2256  . feeding supplement (VITAL AF 1.2 CAL) 1,000 mL (10/27/15 2256)     Radiological Exams on Admission: Dg Chest Port 1 View  Result Date: 10/28/2015 CLINICAL DATA:  Respiratory failure. Shortness of breath. Question increase in pneumothorax.  EXAM: PORTABLE CHEST 1 VIEW COMPARISON:  10/25/2015 FINDINGS: Enteric tube remains in place, tip below the diaphragm not included in the field of view. Tip of the right central line in the mid SVC. Right chest tube remains in place directed towards the lung apex. Small primarily inferior right pneumothorax is unchanged from prior. Persistent asymmetric opacity with right increase compared to left, unchanged. Left pleural effusion and atelectasis unchanged. The mediastinal contours are unchanged. IMPRESSION: 1. Unchanged small right inferior pneumothorax with right chest tube in place. No increased size of the pneumothorax compared to prior exams. 2. Unchanged left pleural effusion and basilar opacity. 3. Unchanged diffuse interstitial prominence of the right hemithorax. Electronically Signed   By: Jeb Levering M.D.   On: 10/28/2015 03:48    Impression/Recommendations Active Problems:   Pleural effusion, right   Pleural effusion associated with pancreatitis   Pneumothorax   Acute respiratory failure with hypoxia ([facility])   Abdominal discomfort   LUQ pain   Protein-calorie malnutrition ([facility])   Pancreatitis   SOB (shortness of breath)   Abdominal bloating   Sinus tachycardia ([facility])   Anxiety state   Vasculitis ([place])   Abdominal distension   Metastatic carcinoma ([facility])   Right Lower Lobe & Left Upper Lobe Masses:   -Treated as ANCA (-) vasculitis (possible Wegener's dse) with Prednisone and Rituximab in June 2017.  -RLL pleura/lung final pathology is still pending. Pathologist is doing more stains. He thinks there is carcinoma in RLL specimen. -Right chest tube in place causing significant pain, continue Fentanyl PCA  COPD:   - Pulmicort nebulizer BID.  - Continuing Xopenex nebs q6hr scheduled  -Titrate O2 to maintain SPO2 89-93% -NOTE; patient unable to use inhaler as she cannot/will not take a deep breath.   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 Concern for cystic neoplasm (Pancreas) - increased size and # of peripancreatic fluid collections unable to rule out cystic neoplasm. CA-19-9 is 535, normal CEA. - GI following > eventual MRI/MRCP vs EUS as an oupt per GI. - tolerating TF - has abd pain which seems to be at baseline.   METASTATIC POORLY DIFFERENTIATED CARCINOMA -Long family meeting with sisters, counseled very poor outcome likely. They requested that some of her anxiety medication be reduced in order that she can better participate in her medical decisions. Deferred changing CODE STATUS until oncology has seen patient. -8/1 placed consult for oncology.  Possible lytic lesion in Rt acetabulum  -not seen in previous ct scan or PET scan.   Coronary Artery Disease and  HTN:   - Continuing ASA '81mg'$  daily  Sinus tachycardia -most likely multifactorial to include anxiety, pain from chest tube/ VATS. -Fentanyl PCA --See anxiety/depression -Metoprolol IV 5 mg QID  Anxiety/Depression - Ativan to mg QID (note patient on Xanax TID at home) - 7/30 Zoloft 25 mg daily  Tobacco abuse disorder - Cessation counseling prior to discharge -Nicotine patch 21 mg  Hemochromatosis -treated with Phlebotomy (last on 06/16/2015), followed by Dr [name] . Folate processing, B12 normal.   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 Discontinued  7/5?  - P-ANCA elevated at 1:1.6 on 07/23/15,  normal at <1:20 on 10/03/15. Receiving weekly Rituxan infusions through rheumatologist, Dr [name]  Chronic diarrhea,  -suspected post cholecystectomy related. ?IBS-D. currently not having issues with diarrhea.  -Colonoscopy 06/2014: benign cecal polyp, normal random biopsies, non-bleeding cecal AVM.   Hypokalemia  -Potassium IV 40 mEq  Hypomagnesemia -Magnesium IV 1 gm   Goals of care -Spoke with daughter who  understands most likely the stomach pancreatic cancer but would like to hold on telling mother (very anxious/depressed) until final pathology report. -8/1 Family meeting. Daughters do not want to change patient's CODE STATUS at this time. Requested reduction in anxiety medication in order that patient's mentation clear. They understand that patient's anxiety level will increase proportionally. Will wait on making decisions 1 changing CODE STATUS, palliative care/hospice until oncology has seen patient on 8/2.             Thank you for this consultation.  Our [facility] hospitalist team will follow the patient with you.   Time Spent: 120 minutes  WOODS, Geraldo Docker M.D. [facility] 10/28/2015, 8:15 PM

## 2015-10-28 NOTE — Progress Notes (Signed)
[facility] Progress Note Patient Name: [name] [name] DOB: September 03, 1945 MRN: [id number]   Date of Service  10/28/2015  HPI/Events of Note  RN calls as pt is very agitated and anxious and restless. Has not been sleeping much x 2-3 days. Per RT, pt appears tight.  Camerad in > 147/90, 130, 30s, 90%.  Just received 1 mg of ativan > starting to calm down  Pt also has a CT on water seal.   eICU Interventions  Cont with prn ativan. Will need longer acting PO meds.   Stat cxr to check if ptx is bigger.   Stat abg.   Plan d/w daughter at bedside.      Intervention Category Major Interventions: Other:  [place] 10/28/2015, 3:21 AM

## 2015-10-28 NOTE — Progress Notes (Signed)
Progress Note   Subjective  Patient resting in bed, spoke with daughter at length. She continues to have nausea and had tube feeds stopped as she felt it was worsening her symptoms. Overnight events noted.    Objective   Vital signs in last 24 hours: Temp:  [97.3 F (36.3 C)-98.6 F (37 C)] 97.3 F (36.3 C) (08/01 0815) Pulse Rate:  [120-125] 125 (08/01 0815) Resp:  [21-34] 26 (08/01 0815) BP: (133-147)/(51-91) 137/64 (08/01 0815) SpO2:  [91 %-97 %] 95 % (08/01 0815) FiO2 (%):  [28 %] 28 % (08/01 0000) Weight:  [157 lb (71.2 kg)-157 lb 10.1 oz (71.5 kg)] 157 lb 10.1 oz (71.5 kg) (08/01 0500) Last BM Date: 10/27/15 General:    white female, dobhoff in place Exam not performed to not wake her, resting in bed, per daughter   Intake/Output from previous day: 07/31 0701 - 08/01 0700 In: 1597.3 [I.V.:1337.3; NG/GT:60; IV Piggyback:200] Out: 1045 [Urine:875; Chest Tube:170] Intake/Output this shift: No intake/output data recorded.  Lab Results:  Recent Labs  10/25/15 1320 10/25/15 1625 10/26/15 0348  WBC 10.6*  --  11.4*  HGB 8.6*  --  8.8*  HCT 28.2* 27.5* 29.3*  PLT 241  --  255   BMET  Recent Labs  10/26/15 0348 10/27/15 0505 10/28/15 0500  NA 137 138 138  K 3.4* 3.4* 3.3*  CL 102 104 104  CO2 30 27 28   GLUCOSE 107* 149* 160*  BUN 13 14 10   CREATININE 0.38* 0.36* 0.31*  CALCIUM 8.4* 8.1* 7.8*   LFT  Recent Labs  10/25/15 1320  PROT 4.2*  ALBUMIN 1.8*  AST 14*  ALT 15  ALKPHOS 78  BILITOT 0.5   PT/INR No results for input(s): LABPROT, INR in the last 72 hours.  Studies/Results: Dg Chest Port 1 View  Result Date: 10/28/2015 CLINICAL DATA:  Respiratory failure. Shortness of breath. Question increase in pneumothorax. EXAM: PORTABLE CHEST 1 VIEW COMPARISON:  10/25/2015 FINDINGS: Enteric tube remains in place, tip below the diaphragm not included in the field of view. Tip of the right central line in the mid SVC. Right chest tube remains in  place directed towards the lung apex. Small primarily inferior right pneumothorax is unchanged from prior. Persistent asymmetric opacity with right increase compared to left, unchanged. Left pleural effusion and atelectasis unchanged. The mediastinal contours are unchanged. IMPRESSION: 1. Unchanged small right inferior pneumothorax with right chest tube in place. No increased size of the pneumothorax compared to prior exams. 2. Unchanged left pleural effusion and basilar opacity. 3. Unchanged diffuse interstitial prominence of the right hemithorax. Electronically Signed   By: Jeb Levering M.D.   On: 10/28/2015 03:48       Assessment / Plan:   70 y/o female with Wegener's, exudative pleural effusion s/p VATS, suspected malignancy of unclear primary, with pancreatitis and associated fluid collections of unclear etiology. Per radiology, MRCP and prior CTs do not appear to show evidence of pancreatic malignancy although the MRCP was limited due to respiratory motion. We could consider EUS, will discuss with Dr. Ardis Hughs but await meeting with family to discuss goals of care. Daughter feels pancreatitis was related to bactrim as time course fits, although last took this over 5 weeks ago. No evidence of biliary pathology to cause pancreatitis or other etiology on labs thus far. IgG4 sent and pending to evaluate for autoimmune pancreatitis but this seems unlikely. Her pancreatitis has been slow to resolve. I think it's less likely  the peripancreatic fluid collections are causing her symptoms, as they are small and no significant pancreatic ductal dilation noted, although possible. She has asked tube feeds to be stopped today although discussed importance of nutrition. We will see if stopping tube feeds improves symptoms, as it has not in the past, and resume later or tomorrow if she is willing. Will await results of family meeting later today to address goals of care and await final pathology results in regards to  possible malignancy.     Dorado Cellar, MD [facility] Pager [phone]

## 2015-10-28 NOTE — Progress Notes (Signed)
Pt has pulled coratrak tube out. MD on floor made aware. No new orders given.

## 2015-10-28 NOTE — Significant Event (Signed)
Rapid Response Event Note  Overview: Time Called: 0319 Arrival Time: 0321 Event Type: Respiratory  Initial Focused Assessment: Respiratory distress   Interventions: HHN, ABG and Chest x-ray  Plan of Care (if not transferred):   Event Summary:  Called to assist with care of patient having difficulty breathing. Patient is alert and restless. Skin is warm and dry. Heart sound regular and tachycardic at rate of 126. Lungs sounds diminished per RT. Pt had refused scheduled HHN. Alert and oriented x 3. Moderate abd breathing noted. Right side chest tube intact and to water seal. O2 sats 88-93 on  4l [place]. On-call provider was made aware of patient condition and orders were received. Level of distress decreased after HHN and head of bed was elevated.  ABG and chest x-ray was performed. Anti anxiety measures encouraged. Family member remains at beside. We will continue to assist with care as needed.              [name] [name]

## 2015-10-28 NOTE — Progress Notes (Signed)
Name: [name] [name] MRN: [id number] DOB: July 08, 1945    ADMISSION DATE:  10/08/2015  CHIEF COMPLAINT:  Dyspnea  BRIEF PATIENT DESCRIPTION: 70 year old female with R hydropneumothorax admitted from office for CVTS evaluation.  STUDIES:  09/16/15 TTE: Normal LV size with mild LV hypertrophy, EF 65-70%. Normal RV size and systolic function. No significant valvular abnormalities. 7/07 CT Chest: moderate R hydropneumothorax, abnormal pleural thickening with scattered areas of pleural fluid in the R hemithorax, additional subpleural reticulation / fibrosis with interlobular septal thickening R>L, fluid along the pancreatic body/tail related to known acute pancreatitis 7/03 Cytology from [facility]: acute inflammation, no malignant cells 7/07 Cytology from [facility]: no malignant cells    MICROBIOLOGY:  Urine Ctx 7/2:  Recommended recollection Urine Ctx 7/3:  Recommended recollection R Pleural Fluid 7/3:  Negative R Pleural Fluid 7/6:  Negative MRSA PCR 7/20:  Negative R Pleural Fluid 7/21> neg gm stain>> R Parietal Pleural Biopsy 7/21> neg   ANTIBIOTICS: Cefuroxime 7/20 - 7/21 (surgical prophylaxis)   SUBJECTIVE:   Continues to be very anxious.  CT with still airleak  VITAL SIGNS:   Temp:  [97.3 F (36.3 C)-98.6 F (37 C)] 97.3 F (36.3 C) (08/01 0815) Pulse Rate:  [120-125] 125 (08/01 0815) Resp:  [21-29] 26 (08/01 0815) BP: (133-147)/(51-91) 137/64 (08/01 0815) SpO2:  [92 %-97 %] 95 % (08/01 0815) FiO2 (%):  [28 %] 28 % (08/01 0000) Weight:  [157 lb (71.2 kg)-157 lb 10.1 oz (71.5 kg)] 157 lb 10.1 oz (71.5 kg) (08/01 0500)   Physical Exam: General: Elderly female, anxious, mild distress, very weak Neurological:  No focal deficits, anxious HEENT: Moist mucus membranes. No scleral injection or icterus. Pupils symmetric.  Cardiac: S1,S2, RRR, no MRG Pulmonary: Clear, no wheeze or crackles. Right chest tube in place with slight air leak on room air Abdomen:  Soft. NT/ (-)  masses. Tenderness in LUQ and RUQ Integument:  Warm & dry. No rash on exposed skin.   Recent Labs Lab 10/26/15 0348 10/27/15 0505 10/28/15 0500  NA 137 138 138  K 3.4* 3.4* 3.3*  CL 102 104 104  CO2 30 27 28   BUN 13 14 10   CREATININE 0.38* 0.36* 0.31*  GLUCOSE 107* 149* 160*    Recent Labs Lab 10/25/15 1320 10/25/15 1625 10/26/15 0348  HGB 8.6*  --  8.8*  HCT 28.2* 27.5* 29.3*  WBC 10.6*  --  11.4*  PLT 241  --  255   Mr Abdomen Mrcp Wo Cm  Result Date: 10/26/2015 CLINICAL DATA:  Acute pancreatitis and history of pseudocysts. EXAM: MRI ABDOMEN WITHOUT CONTRAST  (INCLUDING MRCP) TECHNIQUE: Multiplanar multisequence MR imaging of the abdomen was performed. Heavily T2-weighted images of the biliary and pancreatic ducts were obtained, and three-dimensional MRCP images were rendered by post processing. COMPARISON:  CT scan 10/19/2015 FINDINGS: Examination is quite limited due to respiratory motion. The lungs demonstrate severe lung disease along with the pleural effusions. The heart is mildly enlarged. Benign-appearing hepatic cysts are again demonstrated. No worrisome hepatic lesions or intrahepatic biliary dilatation. The gallbladder surgically absent. Normal caliber and course of the common bile duct. No obvious common bile ducts stones. The pancreatic duct is also normal in caliber and has a normal course. No pancreatic divisum. In the midbody region of the pancreas I do not see the pancreatic duct. I assume it is mildly compressed by acute inflammation/ pancreatitis. I do not see an obvious mass and I do not see any mass lesion  the prior CT scans. There is fluid around the pancreas consistent with pancreatitis. Small pseudocysts are again noted. No mesenteric or retroperitoneal mass or adenopathy. IMPRESSION: 1. Very limited study due to respiratory motion. 2. Normal caliber and course of the common bile duct. No common bile duct stones. 3. Findings consistent with pancreatitis. No mass  and no evidence of pancreatic divisum. 4. Stable hepatic cysts. 5. Extensive bilateral lung disease and effusions. Electronically Signed   By: Marijo Sanes M.D.   On: 10/26/2015 13:48  Dg Chest Port 1 View  Result Date: 10/28/2015 CLINICAL DATA:  Respiratory failure. Shortness of breath. Question increase in pneumothorax. EXAM: PORTABLE CHEST 1 VIEW COMPARISON:  10/25/2015 FINDINGS: Enteric tube remains in place, tip below the diaphragm not included in the field of view. Tip of the right central line in the mid SVC. Right chest tube remains in place directed towards the lung apex. Small primarily inferior right pneumothorax is unchanged from prior. Persistent asymmetric opacity with right increase compared to left, unchanged. Left pleural effusion and atelectasis unchanged. The mediastinal contours are unchanged. IMPRESSION: 1. Unchanged small right inferior pneumothorax with right chest tube in place. No increased size of the pneumothorax compared to prior exams. 2. Unchanged left pleural effusion and basilar opacity. 3. Unchanged diffuse interstitial prominence of the right hemithorax. Electronically Signed   By: Jeb Levering M.D.   On: 10/28/2015 03:48   ASSESSMENT / PLAN:  70 year old female with recurrent right exudative pleural effusion and acute on chronic pancreatitis with pseudocyst formation. Patient underwent VATS 7/21 c/w empyema with culture and parietal pleural biopsy. Successfully extubated post-operative and transferred to the stepdown unit for further care.   Recurrent Right Exudative Pleural Effusion c/w empyema    S/P VATS 7/21 .  - Following culture and pathology from VATS > no official patgology report seen in EPIC. The prelim report is probably cancer but we are awaiting the final result before informing the patient. Daughter was informed about the prelim read on 7/26.  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   Asthma vs COPD:  No signs of exacerbation.   - Continuing Breo in place of home Dulera.  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 Concern for cystic neoplasm (Pancreas) - GI following > eventual MRI/MRCP vs EUS as an oupt per GI. - tolerating TF - Continuing issues with abdominal pain.   Possible lytic lesion in R acetabulum: Not seen in previous CT scan or PET scan. Follow the pending path results  Coronary Artery Disease and  HTN:   - Continuing ASA 81mg  daily - Clonidine   Anxiety/Depression -Standing lorazepam -Zoloft  Tobacco abuse disorder - Cessation counseling prior to discharge  [facility] ACNP Maryanna Shape PCCM Pager [phone] till 3 pm If no answer page [phone] 10/28/2015, 9:33 AM  Attending note: I have seen and examined the patient with nurse practitioner/resident and agree with the note. History, labs and imaging reviewed.  Awaiting final results of the biopsy. I called the pathology dept and was told that the finalized results will be back later today.  Family meeting planned by [facility] for after the results are back. PCCM will be glad to be of assistance if needed for the meeting.   Since [facility] service is managing the medical issues we will follow  intermittently. Discussed with the pt, daughter and Dr. Sherral Hammers.  Marshell Garfinkel MD [place] Pager [phone] If no answer or after 3pm call: [phone] 10/28/2015, 12:13 PM

## 2015-10-28 DEATH — deceased

## 2015-10-29 ENCOUNTER — Inpatient Hospital Stay (HOSPITAL_COMMUNITY): Payer: Medicare Other

## 2015-10-29 DIAGNOSIS — C7800 Secondary malignant neoplasm of unspecified lung: Secondary | ICD-10-CM

## 2015-10-29 DIAGNOSIS — C799 Secondary malignant neoplasm of unspecified site: Secondary | ICD-10-CM

## 2015-10-29 DIAGNOSIS — Z515 Encounter for palliative care: Secondary | ICD-10-CM

## 2015-10-29 DIAGNOSIS — D638 Anemia in other chronic diseases classified elsewhere: Secondary | ICD-10-CM

## 2015-10-29 DIAGNOSIS — D801 Nonfamilial hypogammaglobulinemia: Secondary | ICD-10-CM

## 2015-10-29 LAB — BASIC METABOLIC PANEL
Anion gap: 7 (ref 5–15)
BUN: 8 mg/dL (ref 6–20)
CALCIUM: 8.3 mg/dL — AB (ref 8.9–10.3)
CO2: 28 mmol/L (ref 22–32)
Chloride: 102 mmol/L (ref 101–111)
Glucose, Bld: 106 mg/dL — ABNORMAL HIGH (ref 65–99)
Potassium: 3.8 mmol/L (ref 3.5–5.1)
SODIUM: 137 mmol/L (ref 135–145)

## 2015-10-29 LAB — MAGNESIUM: Magnesium: 2 mg/dL (ref 1.7–2.4)

## 2015-10-29 LAB — IGG 4: IgG, Subclass 4: 3 mg/dL (ref 2–96)

## 2015-10-29 LAB — CBC
HCT: 28.4 % — ABNORMAL LOW (ref 36.0–46.0)
Hemoglobin: 8.6 g/dL — ABNORMAL LOW (ref 12.0–15.0)
MCH: 30.6 pg (ref 26.0–34.0)
MCHC: 30.3 g/dL (ref 30.0–36.0)
MCV: 101.1 fL — AB (ref 78.0–100.0)
PLATELETS: 319 10*3/uL (ref 150–400)
RBC: 2.81 MIL/uL — AB (ref 3.87–5.11)
RDW: 16.3 % — AB (ref 11.5–15.5)
WBC: 16.5 10*3/uL — ABNORMAL HIGH (ref 4.0–10.5)

## 2015-10-29 LAB — GLUCOSE, CAPILLARY
GLUCOSE-CAPILLARY: 111 mg/dL — AB (ref 65–99)
Glucose-Capillary: 133 mg/dL — ABNORMAL HIGH (ref 65–99)

## 2015-10-29 MED ORDER — GLYCOPYRROLATE 1 MG PO TABS: 1.0000 mg | ORAL_TABLET | ORAL | Status: DC | PRN

## 2015-10-29 MED ORDER — ONDANSETRON 4 MG PO TBDP: 4.0000 mg | ORAL_TABLET | Freq: Four times a day (QID) | ORAL | Status: DC | PRN

## 2015-10-29 MED ORDER — ACETAMINOPHEN 325 MG PO TABS: 650.0000 mg | ORAL_TABLET | Freq: Four times a day (QID) | ORAL | Status: DC | PRN

## 2015-10-29 MED ORDER — HALOPERIDOL LACTATE 2 MG/ML PO CONC: 0.5000 mg | ORAL | Status: DC | PRN

## 2015-10-29 MED ORDER — GLYCOPYRROLATE 0.2 MG/ML IJ SOLN
0.2000 mg | INTRAMUSCULAR | Status: DC | PRN
  Administered 2015-11-03: 0.2 mg via INTRAVENOUS
  Filled 2015-10-29: qty 1

## 2015-10-29 MED ORDER — ONDANSETRON HCL 4 MG/2ML IJ SOLN: 4.0000 mg | Freq: Four times a day (QID) | INTRAMUSCULAR | Status: DC | PRN

## 2015-10-29 MED ORDER — HALOPERIDOL 0.5 MG PO TABS: 0.5000 mg | ORAL_TABLET | ORAL | Status: DC | PRN

## 2015-10-29 MED ORDER — BIOTENE DRY MOUTH MT LIQD: 15.0000 mL | OROMUCOSAL | Status: DC | PRN

## 2015-10-29 MED ORDER — POLYVINYL ALCOHOL 1.4 % OP SOLN: 1.0000 [drp] | Freq: Four times a day (QID) | OPHTHALMIC | Status: DC | PRN

## 2015-10-29 MED ORDER — ACETAMINOPHEN 650 MG RE SUPP: 650.0000 mg | Freq: Four times a day (QID) | RECTAL | Status: DC | PRN

## 2015-10-29 MED ORDER — POLYETHYLENE GLYCOL 3350 17 G PO PACK
17.0000 g | PACK | Freq: Every day | ORAL | Status: DC | PRN
  Administered 2015-10-29 – 2015-10-30 (×2): 17 g via ORAL
  Filled 2015-10-29 (×2): qty 1

## 2015-10-29 MED ORDER — HALOPERIDOL LACTATE 5 MG/ML IJ SOLN
0.5000 mg | INTRAMUSCULAR | Status: DC | PRN
  Administered 2015-10-31: 0.5 mg via INTRAVENOUS
  Filled 2015-10-29: qty 1

## 2015-10-29 MED ORDER — SODIUM CHLORIDE 0.9 % IV SOLN
250.0000 ug/h | INTRAVENOUS | Status: DC
  Administered 2015-10-29: 10 ug/h via INTRAVENOUS
  Administered 2015-10-30 – 2015-10-31 (×2): 150 ug/h via INTRAVENOUS
  Administered 2015-11-01 (×2): 200 ug/h via INTRAVENOUS
  Administered 2015-11-01: 175 ug/h via INTRAVENOUS
  Administered 2015-11-02 – 2015-11-03 (×3): 250 ug/h via INTRAVENOUS
  Filled 2015-10-29 (×10): qty 50

## 2015-10-29 MED ORDER — FENTANYL BOLUS VIA INFUSION
20.0000 ug | INTRAVENOUS | Status: DC | PRN
  Administered 2015-10-29 – 2015-10-30 (×28): 20 ug via INTRAVENOUS
  Filled 2015-10-29: qty 20

## 2015-10-29 MED ORDER — LORAZEPAM 2 MG/ML IJ SOLN
1.0000 mg | Freq: Four times a day (QID) | INTRAMUSCULAR | Status: DC | PRN
  Administered 2015-10-29 – 2015-10-31 (×5): 2 mg via INTRAVENOUS
  Filled 2015-10-29 (×6): qty 1

## 2015-10-29 MED ORDER — GLYCOPYRROLATE 0.2 MG/ML IJ SOLN: 0.2000 mg | INTRAMUSCULAR | Status: DC | PRN

## 2015-10-29 NOTE — Progress Notes (Addendum)
[id number]       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  Objective    Temp:  [97.3 F (36.3 C)-98.6 F (37 C)] 98 F (36.7 C) (08/02 0441) Pulse Rate:  [117-132] 131 (08/02 0441) Resp:  [23-37] 27 (08/02 0800) BP: (140-152)/(66-98) 143/98 (08/02 0441) SpO2:  [92 %-99 %] 97 % (08/02 0818)   Intake/Output Summary (Last 24 hours) at 10/29/15 0840 Last data filed at 10/29/15 0443  Gross per 24 hour  Intake          2662.67 ml  Output              500 ml  Net          2162.67 ml       General appearance: moderate distress Heart: regular rate and rhythm and tachy Lungs: wheezes throughout Abdomen: + tender Extremities: no edema Wound: not observed  Lab Results:  Recent Labs  10/28/15 0500 10/29/15 0422  NA 138 137  K 3.3* 3.8  CL 104 102  CO2 28 28  GLUCOSE 160* 106*  BUN 10 8  CREATININE 0.31* <0.30*  CALCIUM 7.8* 8.3*  MG 1.7 2.0   No results for input(s): AST, ALT, ALKPHOS, BILITOT, PROT, ALBUMIN in the last 72 hours. No results for input(s): LIPASE, AMYLASE in the last 72 hours.  Recent Labs  10/29/15 0422  WBC 16.5*  HGB 8.6*  HCT 28.4*  MCV 101.1*  PLT 319   No results for input(s): CKTOTAL, CKMB, TROPONINI in the last 72 hours. Invalid input(s): POCBNP No results for input(s): DDIMER in the last 72 hours. No results for input(s): HGBA1C in the last 72 hours. No results for input(s): CHOL, HDL, LDLCALC, TRIG, CHOLHDL in the last 72 hours. No results for input(s): TSH, T4TOTAL, T3FREE, THYROIDAB in the last 72 hours.  Invalid input(s): FREET3 No results for input(s): VITAMINB12, FOLATE, FERRITIN, TIBC, IRON, RETICCTPCT in the last 72  hours.  Medications: Scheduled . aspirin  81 mg Oral Daily  . bisacodyl  10 mg Oral Daily  . budesonide (PULMICORT) nebulizer solution  0.25 mg Nebulization BID  . chlorhexidine  15 mL Mouth Rinse BID  . enoxaparin (LOVENOX) injection  40 mg Subcutaneous Q24H  . famotidine  20 mg Oral Daily  . feeding supplement (PRO-STAT SUGAR FREE 64)  30 mL Per Tube BID  . fentaNYL   Intravenous Q4H  . levalbuterol  0.63 mg Nebulization Q6H  . metoprolol  5 mg Intravenous Q6H  . multivitamin with minerals  1 tablet Oral Daily  . nicotine  21 mg Transdermal Daily  . sertraline  25 mg Oral Daily  . simethicone  80 mg Oral QID  . sodium chloride flush  10-40 mL Intracatheter Q12H  . sodium chloride flush  3 mL Intravenous Q12H  . sodium chloride flush  3 mL Intravenous Q12H     Radiology/Studies:  Dg Chest Port 1 View  Result Date: 10/29/2015 CLINICAL DATA:  Respiratory failure EXAM: PORTABLE CHEST 1 VIEW COMPARISON:  Yesterday FINDINGS: Stable right basilar pneumothorax which may be ex vacuo, 10% or less. Stable positioning of right-sided chest tube. Small left pleural effusion with lateral  loculation. Interstitial opacity asymmetric to the right lung, gradually increased over the previous studies. Normal heart size. Stable positioning of right IJ catheter. IMPRESSION: 1. Unchanged small right basilar pneumothorax. 2. Unchanged small left pleural effusion with lateral loculation. 3. Gradual increase in interstitial opacity, question edema. Electronically Signed   By: Monte Fantasia M.D.   On: 10/29/2015 08:10   Dg Chest Port 1 View  Result Date: 10/28/2015 CLINICAL DATA:  Respiratory failure. Shortness of breath. Question increase in pneumothorax. EXAM: PORTABLE CHEST 1 VIEW COMPARISON:  10/25/2015 FINDINGS: Enteric tube remains in place, tip below the diaphragm not included in the field of view. Tip of the right central line in the mid SVC. Right chest tube remains in place directed towards the lung  apex. Small primarily inferior right pneumothorax is unchanged from prior. Persistent asymmetric opacity with right increase compared to left, unchanged. Left pleural effusion and atelectasis unchanged. The mediastinal contours are unchanged. IMPRESSION: 1. Unchanged small right inferior pneumothorax with right chest tube in place. No increased size of the pneumothorax compared to prior exams. 2. Unchanged left pleural effusion and basilar opacity. 3. Unchanged diffuse interstitial prominence of the right hemithorax. Electronically Signed   By: Jeb Levering M.D.   On: 10/28/2015 03:48    INR: Will add last result for INR, ABG once components are confirmed Will add last 4 CBG results once components are confirmed  Assessment/Plan: S/P Procedure(s) (LRB): VIDEO ASSISTED THORACOSCOPY (VATS) with Drainage of Pleural Effusion (Right) DRAINAGE OF PLEURAL EFFUSION (Right)   1 conts to do poorly, seen by oncology and plan reportedly is for pallliative care only 2 + small air leak, minor drainage- will keep tube for now   LOS: 16 days    GOLD,WAYNE E 8/2/20178:40 AM   Chart reviewed, patient examined, agree with above. Small air leak and CXR shows persistent stable small basilar space. I would keep chest tube for now and evaluate on a daily basis.

## 2015-10-29 NOTE — Care Management Important Message (Signed)
Important Message  Patient Details  Name: [name] [name] MRN: [id number] Date of Birth: [birth year]   Medicare Important Message Given:  Yes    [name] May 10/29/2015, 10:46 AM

## 2015-10-29 NOTE — Consult Note (Signed)
  [facility] TEAM 1 - Stepdown/ICU TEAM  Chart reviewed.  Notes from palliative care and oncology reviewed.  The patient has now been transitioned to full comfort care measures with the anticipation of a hospital death.  The patient may transition to GIP status tomorrow, in which case her care will be provided by the palliative care attending.  Should this not occur [facility] will be happy to assume the attending role to provide for her ongoing care and comfort.   Cherene Altes, MD [facility] Office  [phone] Pager - Text Page per Amion as per below:  On-Call/Text Page:      [email or web address]      password [email or web address]  If 7PM-7AM, please contact night-coverage [email or web address] Password [facility] 10/29/2015, 5:28 PM

## 2015-10-29 NOTE — Progress Notes (Signed)
Path results reviewed- c/w with poorly differentiated cancer. Oncology is recommending palliative care.  I met with [name] [name] and the family today and agree with their decision for hospice care.  PCCM will be available as needed.  [name] Garfinkel MD [place] [facility] Pager [phone] If no answer or after 3pm call: [phone] 10/29/2015, 11:32 AM

## 2015-10-29 NOTE — Consult Note (Signed)
Consultation Note Date: 10/29/2015   Patient Name: [name] [name]  DOB: 1945/08/29  MRN: [id number]  Age / Sex: 70 y.o., female  PCP: [name], MD Referring Physician: Gaye Pollack, MD  Reason for Consultation: Non pain symptom management, Pain control, Psychosocial/spiritual support and Terminal Care  HPI/Patient Profile: 70 y.o. female  with past medical history of Anxiety, arthritis, GERD, granulomatosis with polyangiitis, hereditary hemochromatosis, hyperlipidemia, hypertension, admitted on 10/27/2015 with abdominal pain and nausea. Patient's been experiencing worsening shortness of breath. 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 She completed 3 weeks of this infusion but missed her fourth treatment because of illness. Additionally she was started on high-dose prednisone. Per chart review she believes that this contributed to her abdominal pain. A CT scan of the abdomen on 09/23/2015 showed right lower lobe mass of 2.6 cm as well as a right pleural effusion. She had features of acute pancreatitis with a small pseudocyst. Patient underwent thoracentesis on 09/29/2015 secondary to worsening short of breath that showed idiopathic exudate with acute inflammatory cells. Thoracentesis was repeated on 10/02/2015. This was complicated by small right basilar pneumothorax. She was seen by Dr. 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 Recommendations by oncology was to transition to comfort care.  Clinical Assessment and Goals of Care: Patient is very short of breath at rest and with minimal conversation. Dr. Sherral Hammers had an extensive goals of care discussion with patient's daughters. Daughter's report the patient shared that she wishes to be a DO NOT RESUSCITATE, which is in place and would not want aggressive treatment. Patient appears to be  transitioning and could potentially have a prognosis of hours to days only. Daughters are tearful but her coping well and recognizes this short prognosis  Patient is losing her ability to make all of her decisions however she has shared with her family her wish for DO NOT RESUSCITATE and no aggressive measures. She is still able to answer simple yes and no questions. In the event she can no longer speak for herself her daughter Suanne Marker and Ginger or her decision makers    SUMMARY OF RECOMMENDATIONS   DNR/DNI We'll DC fentanyl PCA and start continuous infusion of fentanyl at 10 g an hour with 20 g bolus every 15 minutes as needed for pain or dyspnea. This will provide more flexibility for nursing staff to better manage her symptoms. Please keep chest tube  Continue cardiac monitoring Continue low-dose IV fluids Do not advance O2 delivery system to BiPAP, or masks Patient shared with family she does not want hospice. Apparently her husband died in an inpatient hospice not that long ago ; at this point it does appear as though patient only has a prognosis of hours to days We'll discontinue oral medications as patient is no longer able to swallow  Code Status/Advance Care Planning:  DNR    Symptom Management:   Dyspnea: We'll DC fentanyl PCA; patient has been coming more somnolent and has  been unable to push button for adequate control of dyspnea. We'll convert to continuous infusion. We will start a basal rate at 10 g an hour and 20 g every 15 minutes. Instructions to nursing to please call for dosage change if 3 boluses were administered and an hour  Anxiety associated with dyspnea: Per family patient does not do well with benzodiazepines however I do feel like she needs some level of this to manage the anxiety  associated with this level of dyspnea; will continue with Ativan but offer range of 1-2 mg every 6 hours as needed  Secretions: Robinul when necessary  Palliative Prophylaxis:     Aspiration, Bowel Regimen, Delirium Protocol, Frequent Pain Assessment, Oral Care and Turn Reposition  Additional Recommendations (Limitations, Scope, Preferences):  Minimize Medications, Initiate Comfort Feeding, No Artificial Feeding, No Blood Transfusions, No Chemotherapy, No Diagnostics, No Glucose Monitoring, No Hemodialysis, No IV Antibiotics, No Lab Draws, No Radiation and No Surgical Procedures  Psycho-social/Spiritual:   Desire for further Chaplaincy support:no  Additional Recommendations: Grief/Bereavement Support  Prognosis:   Hours - Days  Discharge Planning: At this point we do anticipate hospital death as her respiratory status and overall clinical condition is quite dire. Palliative medicine team is being asked by case management to evaluate for GIP admission. We'll likely need 24 hours in order to make that determination whether she in fact meets the criteria.       Primary Diagnoses: Present on Admission: . Pleural effusion associated with pancreatitis . Pneumothorax . Acute respiratory failure with hypoxia ([place]) . Pleural effusion, right . Abdominal discomfort . LUQ pain . Protein-calorie malnutrition ([place]) . Pancreatitis . SOB (shortness of breath) . Abdominal bloating . Sinus tachycardia ([place]) . Anxiety state . Abdominal distension . Metastatic carcinoma ([place])   I have reviewed the medical record, interviewed the patient and family, and examined the patient. The following aspects are pertinent.  Past Medical History:  Diagnosis Date  . Anxiety   . Arthritis    "all over me, I guess" (10/26/2015)  . ASTHMA   . 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 CAD (coronary artery disease)   . 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 Chronic kidney disease   . COPD (chronic obstructive pulmonary disease) with emphysema ([place])    mild dz on PFTs 11/2013  . Depression   . GERD   . Granulomatosis with polyangiitis  ([place])   . H/O hiatal hernia   . Hereditary hemochromatosis ([place]) 2004  . Hx of adenomatous polyp of colon 07/25/2014  . Hyperlipidemia   . HYPERTENSION   . Hypertension   . Migraine    "hx; not anymore" (10/18/2015)  . On home oxygen therapy    "2L; 24/7" (10/14/2015)  . Osteopenia 10/07/2014   DEXA @ [facility] 09/2014: -1.9 L fem  . PONV (postoperative nausea and vomiting)   . Sinus headache   . Urinary tract infection    "recurrent sometimes" (10/15/2015)   Social History   Social History  . Marital status: Widowed    Spouse name: N/A  . Number of children: 2  . Years of education: N/A   Occupational History  . 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    Social History Main Topics  . Smoking status: Former Smoker    Packs/day: 0.50    Years: 54.00    Types: Cigarettes    Quit date: 08/28/2015  . Smokeless tobacco: Never  Used  . Alcohol use No  . Drug use: No  . Sexual activity: No   Other Topics Concern  . None   Social History Narrative   widowed summer 2012, lives alone. Retired from work in [employer] History  Problem Relation Age of Onset  . Hypertension Mother   . Heart disease Mother   . Breast cancer Mother 63  . Diabetes Father   . Heart disease Father   . Alcohol abuse Other   . Arthritis Other   . Cirrhosis Brother   . Cirrhosis Brother   . Cirrhosis Brother   . Lung cancer Brother   . Colon cancer Neg Hx   . Colon polyps Neg Hx   . Esophageal cancer Neg Hx   . Rectal cancer Neg Hx   . Stomach cancer Neg Hx    Scheduled Meds: . enoxaparin (LOVENOX) injection  40 mg Subcutaneous Q24H  . levalbuterol  0.63 mg Nebulization Q6H  . metoprolol  5 mg Intravenous Q6H  . nicotine  21 mg Transdermal Daily  . sodium chloride flush  3 mL Intravenous Q12H   Continuous Infusions: . dextrose 5 % and 0.9% NaCl 80 mL/hr at 10/29/15 0200  . fentaNYL infusion INTRAVENOUS     PRN Meds:.[DISCONTINUED]  acetaminophen **OR** acetaminophen, alum & mag hydroxide-simeth, antiseptic oral rinse, fentaNYL, [DISCONTINUED] glycopyrrolate **OR** [DISCONTINUED] glycopyrrolate **OR** glycopyrrolate, [DISCONTINUED] haloperidol **OR** [DISCONTINUED] haloperidol **OR** haloperidol lactate, LORazepam, [DISCONTINUED] ondansetron **OR** ondansetron (ZOFRAN) IV, phenol, polyvinyl alcohol, potassium chloride, sodium chloride flush Medications Prior to Admission:  Prior to Admission medications   Medication Sig Start Date End Date Taking? Authorizing Provider  acetaminophen (TYLENOL) 500 MG tablet Take 500 mg by mouth every 6 (six) hours as needed (pain).   Yes Historical Provider, MD  albuterol (PROVENTIL) (2.5 MG/3ML) 0.083% nebulizer solution Take 3 mLs (2.5 mg total) by nebulization every 6 (six) hours as needed for wheezing or shortness of breath. Patient taking differently: Take 2.5 mg by nebulization 2 (two) times daily as needed for wheezing or shortness of breath.  10/09/15  Yes [name], MD  aspirin EC 81 MG tablet Take 81 mg by mouth daily.   Yes Historical Provider, MD  diazepam (VALIUM) 5 MG tablet Take 0.5-1 tablets (2.5-5 mg total) by mouth every 12 (twelve) hours as needed for anxiety. Patient taking differently: Take 5 mg by mouth every 12 (twelve) hours as needed for anxiety (sleep).  10/19/2015  Yes [name], MD  fexofenadine (ALLEGRA) 180 MG tablet Take 180 mg by mouth at bedtime.    Yes Historical Provider, MD  Melatonin 5 MG TABS Take 5 mg by mouth at bedtime.   Yes Historical Provider, MD  metoprolol succinate (TOPROL XL) 25 MG 24 hr tablet Take 1 tablet (25 mg total) by mouth at bedtime. 09/19/15  Yes Burtis Junes, NP  metoprolol succinate (TOPROL-XL) 50 MG 24 hr tablet Take 1 tablet (50 mg total) by mouth daily. Take with or immediately following a meal. 08/28/15  Yes Rhonda G Barrett, PA-C  mometasone-formoterol (DULERA) 200-5 MCG/ACT AERO Inhale 2 puffs into the lungs 2 (two) times daily.  10/04/2015  Yes [name], MD  omeprazole (PRILOSEC) 20 MG capsule Take 1 capsule (20 mg total) by mouth 2 (two) times daily before a meal. 10/08/2015  Yes [name], MD  ondansetron (ZOFRAN ODT) 8 MG disintegrating tablet Take 1 tablet (8 mg total) by mouth every 8 (eight) hours as needed for  nausea or vomiting. 09/27/15  Yes Barton Dubois, MD  OXYGEN Inhale 2 L into the lungs continuous.   Yes Historical Provider, MD  simethicone (MYLICON) 0000000 MG chewable tablet Chew 125 mg by mouth 4 (four) times daily.    Yes Historical Provider, MD  traMADol (ULTRAM) 50 MG tablet Take 1-2 tablets (50-100 mg total) by mouth every 8 (eight) hours as needed for severe pain. 10/03/2015  Yes [name], MD  potassium chloride (K-DUR) 10 MEQ tablet Take 1 tablet (10 mEq total) by mouth daily. Patient not taking: Reported on 10/07/2015 10/07/15   Tawni Millers, MD   Allergies  Allergen Reactions  . Other Other (See Comments)    Please if giving pt anesthesia - she would like to have a scopolamine patch to prevent nausea   . Ciprofloxacin Hives and Other (See Comments)    Blisters on legs  . Codeine Nausea And Vomiting and Other (See Comments)    Sweating, "passes out"  . Macrobid [employer] Macro] Other (See Comments)    Stool incontinence  . Morphine And Related Other (See Comments)    "hospital bed was shaking"  . Prevnar [Pneumococcal 13-Val Conj Vacc] Palpitations and Other (See Comments)    Pt c/o redness, swelling on arm after shot.  Pt c/o having respiratory problems and coughing ever since she got it.    Review of Systems  Unable to perform ROS: Severe respiratory distress    Physical Exam  Constitutional: She appears well-developed and well-nourished.  HENT:  Head: Normocephalic and atraumatic.  Cardiovascular:  Tachy regular  Pulmonary/Chest:  Increased work of breathing at rest and with conversation  Abdominal: Soft.  Neurological:  Lethargic but can still answer  simple questions  Skin:  Cool; faint mottling to knees  Psychiatric:  anxious  Nursing note and vitals reviewed.   Vital Signs: BP (!) 141/44 (BP Location: Right Arm)   Pulse (!) 130   Temp 97.6 F (36.4 C) (Oral)   Resp (!) 39   Ht 5\' 3"  (1.6 m)   Wt 71.5 kg (157 lb 10.1 oz)   SpO2 91%   BMI 27.92 kg/m  Pain Assessment: 0-10 POSS *See Group Information*: S-Acceptable,Sleep, easy to arouse Pain Score: Asleep   SpO2: SpO2: 91 % O2 Device:SpO2: 91 % O2 Flow Rate: .O2 Flow Rate (L/min): (S) 3 L/min (weaned per SAT)  IO: Intake/output summary:  Intake/Output Summary (Last 24 hours) at 10/29/15 1128 Last data filed at 10/29/15 0443  Gross per 24 hour  Intake          2662.67 ml  Output              500 ml  Net          2162.67 ml    LBM: Last BM Date: 10/27/15 Baseline Weight: Weight: 71.9 kg (158 lb 9.6 oz) Most recent weight: Weight: 71.5 kg (157 lb 10.1 oz)     Palliative Assessment/Data:   Flowsheet Rows   Flowsheet Row Most Recent Value  Intake Tab  Referral Department  Hospitalist  Unit at Time of Referral  Intermediate Care Unit  Palliative Care Primary Diagnosis  Cancer  Date Notified  10/29/15  Palliative Care Type  New Palliative care  Reason for referral  Clarify Goals of Care, Non-pain Symptom, Pain, Counsel Regarding Hospice, End of Life Care Assistance  Date of Admission  10/12/2015  Date first seen by Palliative Care  10/29/15  # of days Palliative referral response time  0 Day(s)  # of days IP prior to Palliative referral  16  Clinical Assessment  Palliative Performance Scale Score  30%  Pain Max last 24 hours  Not able to report  Pain Min Last 24 hours  Not able to report  Dyspnea Max Last 24 Hours  Not able to report  Dyspnea Min Last 24 hours  Not able to report  Nausea Max Last 24 Hours  Not able to report  Nausea Min Last 24 Hours  Not able to report  Anxiety Max Last 24 Hours  Not able to report  Psychosocial & Spiritual Assessment    Palliative Care Outcomes  Patient/Family meeting held?  Yes  Who was at the meeting?  2 dtr's, niece  Palliative Care Outcomes  Improved non-pain symptom therapy, Counseled regarding hospice, Changed to focus on comfort, Provided psychosocial or spiritual support, Provided end of life care assistance  Patient/Family wishes: Interventions discontinued/not started   Hemodialysis, BiPAP, Mechanical Ventilation, Vasopressors, NIPPV, Trach, Antibiotics, PEG, Tube feedings/TPN, Transfusion  Palliative Care follow-up planned  Yes, Facility      Time In: 0930 Time Out: 1100 Time Total: 90 min Greater than 50%  of this time was spent counseling and coordinating care related to the above assessment and plan.Staffed with Dr. Hilma Favors  Signed by: Dory Horn, NP   Please contact Palliative Medicine Team phone at [phone] for questions and concerns.  For individual provider: See Shea Evans

## 2015-10-29 NOTE — Progress Notes (Signed)
Full consult to follow. I have discussed with the patient and family members. We agree to change code status to DO NOT RESUSCITATE and consult palliative care to transition her care to comfort measures I have discussed with the primary service I will check on her later

## 2015-10-29 NOTE — Progress Notes (Signed)
Nutrition Brief Note  Chart reviewed. TF has been discontinued. 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 Hospital death anticipated. Pt now transitioning to comfort care.  No further nutrition interventions warranted at this time.  Please re-consult as needed.   Molli Barrows, RD, LDN, [place] Pager [phone] After Hours Pager [phone]

## 2015-10-29 NOTE — Consult Note (Signed)
[facility] NOTE  Patient Care Team: [name], MD as PCP - General (Internal Medicine) Harle Battiest, MD as Consulting Physician (Obstetrics and Gynecology) Nancy Marus, MD (Gynecologic Oncology) Lelon Perla, MD (Cardiology) Heath Lark, MD as Consulting Physician (Hematology and Oncology) Gatha Mayer, MD (Gastroenterology) Alfonzo Feller, RN as [facility]  CHIEF COMPLAINTS/PURPOSE OF CONSULTATION:  Metastatic cancer to the lung, unknown primary, suspect either primary lung cancer versus pancreatic cancer along with possible bone metastasis  HISTORY OF PRESENTING ILLNESS:  [name] [name] 70 y.o. female is seen in the surgical ICU. She appears weak and debilitated and I'm not able to get much history from her directly. The patient is well-known to me. She has been coming to my office for phlebotomy due to hemochromatosis When I saw her in March, she was doing well. Subsequently, she started to have significant shortness of breath and abnormal imaging study She had significant evaluation including CT scan, PET scan and biopsy. Over the past 2 months, she have recurrent hospitalization, initially with pancreatitis and subsequently with pleural effusion. 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 She receives some immunosuppressive therapy and rituximab. Subsequently, she developed pancreatitis, chronic shortness of breath with COPD and become oxygen dependent. With pleural effusion, she had thoracentesis complicated by pneumothorax and empyema requiring surgical exploration and chest tube placement on 10/07/2015 Biopsy was taken from the pleural space  Accession: [id number] Received: 10/01/2015 [name] [name] DOB: [birth year] Age: 63 Gender: F Reported: 10/28/2015 [address] [place] Patient Ph: [phone] MRN #: [id number] [name], [name] [address] Visit #:  [id number] Chart #: Phone:  Fax: CC: REPOF SURGICAL PATHOLOGY FINAL DIAGNOSIS Diagnosis 1. Pleura, biopsy, Right - METASTATIC POORLY DIFFERENTIATED CARCINOMA - SEE COMMENT. 2. Pleura, biopsy, Right #2 - METASTATIC POORLY DIFFERENTIATED CARCINOMA - SEE COMMENT.  She has significant uncontrolled pain due to persistent pancreatitis and shortness of breath despite high flow oxygen. 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   CAD (coronary artery disease)    Chronic diarrhea- suspect post-cholecystectomy 02/18/2014   Chronic kidney disease    COPD (chronic obstructive pulmonary disease) with emphysema (HCC)    mild dz on PFTs 11/2013   Depression    GERD    Granulomatosis with polyangiitis (Wegener's)    H/O hiatal hernia    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   Hx of adenomatous polyp of colon 07/25/2014   Hyperlipidemia    HYPERTENSION    Hypertension    Migraine    "hx; not anymore" (10/07/2015)   On home oxygen therapy    "2L; 24/7" (10/05/2015)   Osteopenia 10/07/2014   DEXA @ [facility] 09/2014: -1.9 L fem   PONV (postoperative nausea and vomiting)    Sinus headache    Urinary tract infection    "recurrent sometimes" (10/12/2015)    SURGICAL HISTORY: Past Surgical History:  Procedure Laterality Date   ABDOMINAL HYSTERECTOMY  ~ [place]  ~ [place]  03/29/2002   COLONOSCOPY W/ BIOPSIES     CYSTECTOMY     cyst removed from intestines  to ovary     CYSTOSCOPY  01/24/14   NEGATIVE;Dr Ottelin   DILATION AND CURETTAGE OF UTERUS     ESOPHAGOGASTRODUODENOSCOPY     LAPAROSCOPIC CHOLECYSTECTOMY  ~ 2011    PLEURAL EFFUSION DRAINAGE Right 10/22/2015   Procedure: DRAINAGE OF PLEURAL EFFUSION;  Surgeon: Gaye Pollack, MD;  Location: [facility] OR;  Service: Thoracic;  Laterality: Right;   TENDON RELEASE Left    wrist    VIDEO ASSISTED THORACOSCOPY (VATS)/THOROCOTOMY Right 09/28/2015   Procedure: VIDEO ASSISTED THORACOSCOPY (VATS) with Drainage of Pleural Effusion;  Surgeon: Gaye Pollack, MD;  Location: [facility] OR;  Service: Thoracic;  Laterality: Right;    SOCIAL HISTORY: Social History   Social History   Marital status: Widowed    Spouse name: N/A   Number of children: 2   Years of education: N/A   Occupational History   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    Social History Main Topics   Smoking status: Former Smoker    Packs/day: 0.50    Years: 54.00    Types: Cigarettes    Quit date: 08/28/2015   Smokeless tobacco: Never Used   Alcohol use No   Drug use: No   Sexual activity: No   Other Topics Concern   Not on file   Social History Narrative   widowed summer 2012, lives alone. Retired from work in [place] HISTORY: Family History  Problem Relation Age of Onset   Hypertension Mother    Heart disease Mother    Breast cancer Mother 38   Diabetes Father    Heart disease Father    Alcohol abuse Other    Arthritis Other    Cirrhosis Brother    Cirrhosis Brother    Cirrhosis Brother    Lung cancer Brother    Colon cancer Neg Hx    Colon polyps Neg Hx    Esophageal cancer Neg Hx    Rectal cancer Neg Hx    Stomach cancer Neg Hx     ALLERGIES:  is allergic to other; ciprofloxacin; codeine; macrobid [nitrofurantoin monohyd macro]; morphine and related; and prevnar [pneumococcal 13-val conj vacc].  MEDICATIONS:  Current Facility-Administered Medications  Medication Dose Route Frequency Provider Last Rate Last Dose   acetaminophen (TYLENOL) suppository 650 mg  650 mg Rectal Q6H PRN Dory Horn, NP       alum & mag hydroxide-simeth (MAALOX/MYLANTA) 200-200-20 MG/5ML suspension 30 mL  30 mL Oral Q4H PRN Gaye Pollack, MD   30 mL at 10/27/15 2005   antiseptic oral rinse (BIOTENE) solution 15 mL  15 mL Topical PRN Dory Horn, NP       dextrose 5 %-0.9 % sodium chloride infusion   Intravenous Continuous Dory Horn, NP 20 mL/hr at 10/29/15 1200     enoxaparin (LOVENOX) injection 40 mg  40 mg Subcutaneous Q24H Erin R Barrett, PA-C   40 mg at 10/28/15 0808   fentaNYL (SUBLIMAZE) 2,500 mcg in sodium chloride 0.9 % 250 mL (10 mcg/mL) infusion  10 mcg/hr Intravenous Continuous Dory Horn, NP 1 mL/hr at 10/29/15 1200 10 mcg/hr at 10/29/15 1200   fentaNYL (SUBLIMAZE) bolus via infusion 20 mcg  20 mcg Intravenous Q15 min PRN Dory Horn, NP   20 mcg at 10/29/15 1138   glycopyrrolate (ROBINUL) injection 0.2 mg  0.2 mg Intravenous Q4H PRN Dory Horn, NP  haloperidol lactate (HALDOL) injection 0.5 mg  0.5 mg Intravenous Q4H PRN Dory Horn, NP       levalbuterol [facility]) nebulizer solution 0.63 mg  0.63 mg Nebulization Q6H Erin R Barrett, PA-C   0.63 mg at 10/29/15 [id number]   LORazepam (ATIVAN) injection 1-2 mg  1-2 mg Intravenous Q6H PRN Dory Horn, NP   2 mg at 10/29/15 1122   metoprolol (LOPRESSOR) injection 5 mg  5 mg Intravenous Q6H Allie Bossier, MD   5 mg at 10/29/15 0500   nicotine (NICODERM CQ - dosed in mg/24 hours) patch 21 mg  21 mg Transdermal Daily Allie Bossier, MD   21 mg at 10/27/15 1027   ondansetron (ZOFRAN) injection 4 mg  4 mg Intravenous Q6H PRN Dory Horn, NP       phenol [facility]) mouth spray 2 spray  2 spray Mouth/Throat PRN Vena Rua, PA-C   2 spray at 10/22/15 1118   polyvinyl alcohol (LIQUIFILM TEARS) 1.4 % ophthalmic solution 1 drop  1 drop Both Eyes QID PRN Dory Horn, NP       potassium chloride 10 mEq in 50 mL *CENTRAL LINE* IVPB  10 mEq Intravenous Daily  PRN Gaye Pollack, MD   10 mEq at 10/27/15 1038   sodium chloride flush (NS) 0.9 % injection 10-40 mL  10-40 mL Intracatheter PRN Gaye Pollack, MD       sodium chloride flush (NS) 0.9 % injection 3 mL  3 mL Intravenous Q12H Magdalen Spatz, NP   3 mL at 10/28/15 2054    REVIEW OF SYSTEMS:  Unable to obtain due to severe distress  PHYSICAL EXAMINATION: ECOG PERFORMANCE STATUS: 4 - Bedbound  Vitals:   10/29/15 0800 10/29/15 0830  BP:  (!) 141/44  Pulse:  (!) 130  Resp: (!) 27 (!) 39  Temp:  97.6 F (36.4 C)   Filed Weights   10/27/15 0500 10/27/15 1803 10/28/15 0500  Weight: 158 lb (71.7 kg) 157 lb (71.2 kg) 157 lb 10.1 oz (71.5 kg)    GENERAL:alert With significant respiratory distress  SKIN: skin color is pale, texture, turgor are normal, no rashes or significant lesions EYES: normal, conjunctiva are pale and non-injected, sclera clear OROPHARYNX:no exudate, no erythema and lips, buccal mucosa, and tongue normal  NECK: supple, thyroid normal size, non-tender, without nodularity LYMPH:  no palpable lymphadenopathy in the cervical, axillary or inguinal LUNGS: Chest tube in situ with significant breathing effort HEART: Tachycardia no murmurs and no lower extremity edema ABDOMEN:abdomen soft, diffusely tender in the epigastric region Musculoskeletal:no cyanosis of digits and no clubbing  PSYCH: alert & oriented x 3 with fluent speech.  NEURO: no focal motor/sensory deficits  LABORATORY DATA:  I have reviewed the data as listed Lab Results  Component Value Date   WBC 16.5 (H) 10/29/2015   HGB 8.6 (L) 10/29/2015   HCT 28.4 (L) 10/29/2015   MCV 101.1 (H) 10/29/2015   PLT 319 10/29/2015    Recent Labs  07/23/15 0901 09/23/15 0855  09/27/15 0532  10/10/2015 0947 10/15/2015 1436  10/20/15 0515 10/21/15 0436 10/25/15 1320  10/27/15 0505 10/28/15 0500 10/29/15 0422  NA 140 136  < > 138  < > 137 135  < > 138 138 138  < > 138 138 137  K 4.1 3.1*  < > 4.0  < > 3.4* 3.1*  <  > 3.4* 3.9 2.9*  < > 3.4* 3.3* 3.8  CL 104 100*  < >  106  < > 97 97*  < > 104 106 104  < > 104 104 102  CO2 27 26  < > 25  < > 33* 31  < > 29 26 27   < > 27 28 28   GLUCOSE 110* 214*  < > 118*  < > 120* 109*  < > 88 99 124*  < > 149* 160* 106*  BUN 12 13  < > 5*  < > 15 12  < > 12 12 11   < > 14 10 8   CREATININE 0.89 0.75  < > 0.66  < > 0.51 0.65  < > 0.44 0.51 0.36*  < > 0.36* 0.31* <0.30*  CALCIUM 10.1 8.8*  < > 8.3*  < > 9.1 9.0  < > 8.1* 8.1* 8.0*  < > 8.1* 7.8* 8.3*  GFRNONAA  --  >60  < > >60  < >  --  >60  < > >60 >60 >60  < > >60 >60 NOT CALCULATED  GFRAA  --  >60  < > >60  < >  --  >60  < > >60 >60 >60  < > >60 >60 NOT CALCULATED  PROT 7.5 6.1*   6.2*  < > 5.0*  < > 5.6* 5.4*  --   --   --  4.2*  --   --   --   --   ALBUMIN 4.4 2.5*   2.8*  < > 2.2*  < > 2.9* 2.4*  < > 1.9* 1.9* 1.8*  --   --   --   --   AST 12 29   28   < > 23  < > 14 17  --   --   --  14*  --   --   --   --   ALT 13 62*   59*  < > 44  < > 16 19  --   --   --  15  --   --   --   --   ALKPHOS 103 89   93  < > 75  < > 86 90  --   --   --  78  --   --   --   --   BILITOT 0.4 0.5   1.0  < > 0.7  < > 0.5 0.8  --   --   --  0.5  --   --   --   --   BILIDIR 0.1 0.3  --  0.2  --   --   --   --   --   --   --   --   --   --   --   IBILI  --  0.2*  --  0.5  --   --   --   --   --   --   --   --   --   --   --   < > = values in this interval not displayed.  RADIOGRAPHIC STUDIES:I review recent CT scan and PET scan  I have personally reviewed the radiological images as listed and agreed with the findings in the report. Dg Chest 1 View  Result Date: 10/03/2015 CLINICAL DATA:  Follow-up thoracentesis ; previously identified right-sided basilar pneumothorax. EXAM: CHEST 1 VIEW COMPARISON:  Chest x-ray of October 02, 2015 FINDINGS: Again demonstrated is a small right basilar pneumothorax. It appears stable in volume. No significant apical component is observed.  There is no mediastinal shift. There is a small left pleural effusion with  minimal left basilar atelectasis. The heart and pulmonary vascularity are normal. IMPRESSION: Stable small right sided basilar pneumothorax. Stable left lower lobe atelectasis and small effusion. Electronically Signed   By: David  Martinique M.D.   On: 10/03/2015 07:44   Dg Chest 1 View  Result Date: 10/02/2015 CLINICAL DATA:  RIGHT side pneumothorax postprocedural EXAM: CHEST 1 VIEW COMPARISON:  Portable exam 1845 hours compared to 1553 hours FINDINGS: Slight decrease in size of previously identified RIGHT basilar pneumothorax. Stable heart size and mediastinal contours. Bibasilar atelectasis and small LEFT pleural effusion. Bones unremarkable. IMPRESSION: Slight decrease in size of previously identified RIGHT basilar pneumothorax. Electronically Signed   By: Lavonia Dana M.D.   On: 10/02/2015 18:55   Dg Chest 1 View  Result Date: 10/02/2015 CLINICAL DATA:  Post right thoracentesis EXAM: CHEST 1 VIEW COMPARISON:  7/6/ 17 FINDINGS: Cardiomediastinal silhouette is stable. Mild right basilar atelectasis. Right pleural effusions has resolved. Trace left pleural effusion with left basilar atelectasis. There is a small loculated right basilar pneumothorax measure about 1.3 cm maximum thickness. IMPRESSION: Small loculated right basilar pneumothorax measures about 1.3 cm maximum thickness. Electronically Signed   By: Lahoma Crocker M.D.   On: 10/02/2015 16:05   Dg Chest 1 View  Result Date: 09/29/2015 CLINICAL DATA:  Right-sided thoracentesis.Reportedly 1.4 L of fluid was removed. Patient is still having shortness of breath. EXAM: CHEST 1 VIEW COMPARISON:  09/28/2015. FINDINGS: The RIGHT pleural effusion is diminished. There is improved opacity at the RIGHT base, likely resolving atelectasis. LEFT pleural effusion appears stable compared with priors. The heart size is normal. No visible RIGHT pneumothorax. IMPRESSION: Decreased RIGHT pleural effusion post thoracentesis. No pneumothorax is evident. Mild atelectasis RIGHT  base. Electronically Signed   By: Staci Righter M.D.   On: 09/29/2015 17:09   Dg Chest 2 View  Result Date: 10/15/2015 CLINICAL DATA:  Pleural effusion with pancreatitis. Chest pain, nausea, diaphoresis. EXAM: CHEST  2 VIEW COMPARISON:  10/10/2015 FINDINGS: The heart size and mediastinal contours are within normal limits. Moderate multiloculated right hydro-pneumothorax shows no significant change, with associated compressive right lung atelectasis. Tiny left pleural effusion is mildly increased in size. Mild left basilar atelectasis or scarring is stable. IMPRESSION: No significant change in moderate multiloculated right hydro-pneumothorax and associated compressive atelectasis. Increased size of tiny left pleural effusion. Electronically Signed   By: Earle Gell M.D.   On: 10/15/2015 08:35   Dg Chest 2 View  Result Date: 10/09/2015 CLINICAL DATA:  Follow-up os balloon ablation for pleural effusion and thoracentesis; patient reports shortness of breath, history of asthma, coronary artery disease, COPD, current smoker. EXAM: CHEST  2 VIEW COMPARISON:  Chest x-ray of October 06, 2015 FINDINGS: There is a persistent multiloculated hydro pneumothorax. This has increased in size since the previous study. There is a small amount of pleural fluid noted at the left lung base. This is stable. The heart is normal in size. The pulmonary vascularity is not engorged. There is stable mild deviation of the trachea toward the right. The bony thorax exhibits no acute abnormality. IMPRESSION: Interval increase in the volume of pleural fluid present on the right. Persistent right pleural space gas collections as well consistent with multiloculated hydropneumothoraces. Stable small left pleural effusion. Electronically Signed   By: David  Martinique M.D.   On: 10/10/2015 09:52   Dg Chest 2 View  Result Date: 10/06/2015 CLINICAL DATA:  Worsening shortness of  breath, left-sided chest pain since last night. EXAM: CHEST  2 VIEW  COMPARISON:  Chest x-ray dated 10/05/2015 and chest CT dated 10/03/2015. FINDINGS: Cardiomediastinal silhouette is stable in size and configuration. The hydropneumothorax at the right lung base appears stable compared to the previous chest x-ray and chest CT. Stable small left pleural effusion and/or atelectasis. No new lung findings. Upper lungs remain clear. IMPRESSION: Stable findings compared to yesterday's chest x-ray. Electronically Signed   By: Franki Cabot M.D.   On: 10/06/2015 16:26   Dg Chest 2 View  Result Date: 10/05/2015 CLINICAL DATA:  70 year old female with shortness of Breath. Loculated posterior right hydro-pneumothorax following recent thoracentesis for progressive right pleural effusion. Initial encounter. EXAM: CHEST  2 VIEW COMPARISON:  Chest CT 10/03/2015 and earlier. FINDINGS: Moderate-sized posterior loculated right hydro pneumothorax probably is not significantly changed from the recent CT. Additional abnormal pleural thickening and nodularity was noted on that study. There is no apical pneumothorax. Right lung ventilation is stable. Small layering left pleural effusion is stable. Stable cardiac size and mediastinal contours. No pulmonary edema. No acute osseous abnormality identified. Stable cholecystectomy clips. Calcified aortic atherosclerosis. IMPRESSION: 1. Stable since the chest CT 10/03/2015. Moderate size loculated posterior right hydro-pneumothorax with additional pleural and parenchymal abnormalities as described on that study. 2. Small layering left pleural effusion. 3. No new cardiopulmonary abnormality. Calcified aortic atherosclerosis. Electronically Signed   By: Genevie Ann M.D.   On: 10/05/2015 09:19   Dg Chest 2 View  Result Date: 10/02/2015 CLINICAL DATA:  Dyspnea, history of asthma, COPD, coronary artery disease, current smoker. EXAM: CHEST  2 VIEW COMPARISON:  Chest x-ray of October 01, 2015 FINDINGS: There has been interval increase in the volume of pleural fluid on the  right which now occupies approximately 1/4 of the pleural space volume. There is no mediastinal shift. The heart is normal in size. The pulmonary vascularity is not engorged. The small left pleural effusion is stable. IMPRESSION: Mild further accumulation of pleural fluid on the right. Stable small left pleural effusion. Electronically Signed   By: David  Martinique M.D.   On: 10/02/2015 08:37   Dg Chest 2 View  Result Date: 10/01/2015 CLINICAL DATA:  Right-sided chest pain, hypoxia, former smoking history EXAM: CHEST  2 VIEW COMPARISON:  Chest x-ray of 09/29/2015 and CT chest of 06/26/2015 FINDINGS: There has been reaccumulation of a moderate size right pleural effusion with a small left pleural effusion present. Bibasilar atelectasis remains right greater than left. Heart size is stable. No bony abnormality is seen. IMPRESSION: Reaccumulation of moderate size right pleural effusion with small left pleural effusion remaining. Electronically Signed   By: Ivar Drape M.D.   On: 10/01/2015 08:44   Dg Abd 1 View  Result Date: 10/21/2015 CLINICAL DATA:  70 year old female -feeding tube placement. EXAM: ABDOMEN - 1 VIEW COMPARISON:  10/18/2015 FINDINGS: A small bore feeding tube is identified with tip overlying the proximal jejunum. No dilated bowel loops are identified. IMPRESSION: Small bore feeding tube with tip overlying the proximal jejunum. Electronically Signed   By: Margarette Canada M.D.   On: 10/21/2015 16:41  Ct Chest Wo Contrast  Result Date: 10/03/2015 CLINICAL DATA:  Acute pancreatitis, acute on chronic respiratory failure with hypoxia, right lower lobe lung mass EXAM: CT CHEST WITHOUT CONTRAST TECHNIQUE: Multidetector CT imaging of the chest was performed following the standard protocol without IV contrast. COMPARISON:  Chest radiograph dated 10/03/2015. Partial comparison to CT abdomen pelvis dated 09/29/2015. PET-CT dated 07/07/2015. CT  chest dated 06/26/2015. FINDINGS: Cardiovascular: The heart is  normal in size. No pericardial effusion. Coronary atherosclerosis of the LAD. Atherosclerotic calcifications of the aortic arch. Mediastinum/Nodes: Small mediastinal lymph nodes which do not meet pathologic CT size criteria. No suspicious axillary lymphadenopathy. Visualized thyroid is unremarkable. Lungs/Pleura: Moderate right hydropneumothorax. Abnormal pleural thickening in the right hemithorax with additional scattered areas of loculated pleural fluid in the right hemithorax, including along the right major fissure. No convincing solid pleural component. Irregular 14 mm subpleural nodule in the left upper lobe (series 5/images 18-19), new from prior CT. No additional pulmonary nodules. Prior cavitary lesion in the posterior right lower lobe is no longer visualized, although possibly obscured. Underlying subpleural reticulation/fibrosis with interlobular septal thickening, right lung predominant. Overall, this appearance is nonspecific, but would be compatible with vasculitis with superimposed hydropneumothorax. Neoplasm is not entirely excluded, but is considered less likely given the imaging findings and prior biopsy results. Upper Abdomen: Mild thickening of the left adrenal gland. Fluid along the pancreatic body/ tail (series 2/ image 129), related to known acute pancreatitis, incompletely visualized. Musculoskeletal: Mild degenerative changes of the visualized thoracolumbar spine. IMPRESSION: Moderate right hydropneumothorax. Additional abnormal pleural thickening with scattered areas of pleural fluid in the right hemithorax. Additional subpleural reticulation/fibrosis with interlobular septal thickening, right greater than left. 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 See comments above. Fluid along the pancreatic body/ tail, related to known acute pancreatitis, incompletely visualized. Electronically Signed   By: Julian Hy M.D.   On: 10/03/2015 21:22   Ct Abdomen Pelvis W  Contrast  Result Date: 10/20/2015 CLINICAL DATA:  70 year old female with history of pancreatitis presenting with abdominal pain. EXAM: CT ABDOMEN AND PELVIS WITH CONTRAST TECHNIQUE: Multidetector CT imaging of the abdomen and pelvis was performed using the standard protocol following bolus administration of intravenous contrast. CONTRAST:  136mL ISOVUE-300 IOPAMIDOL (ISOVUE-300) INJECTION 61% COMPARISON:  CT dated 09/29/2015 FINDINGS: There is a partially visualized small left pleural effusion which appears larger compared the prior study. There is associated partial compressive atelectasis of the visualized left lower lobe. Superimposed pneumonia is not excluded. There is a right sided pneumothorax. A chest tube is noted in the right posterior pleural surface. There is a small right pleural effusion. No intra-abdominal free air or free fluid. Stable appearing hypodense lesions in the left lobe of the liver measure up to 2.7 cm and demonstrates a fluid attenuation, likely cysts. There is apparent fatty infiltration of the liver. Cholecystectomy. There multiple line in mid collection adjacent to the pancreas with the largest collection measures approximately 3.3 x 2.7 cm similar to the prior study in the superior aspect of the proximal body of the pancreas. Multiple other or fluid collection along the pancreatic body noted which are grossly similar to the prior study. There is abutment of the gastric wall with loss of fat plane between the stomach and the pancreas. Small amount of fluid is along the greater curvature of the stomach. The spleen appears unremarkable. Indeterminate bilateral adrenal nodules, likely adenoma. There is no hydronephrosis on either side. The visualized ureters and urinary bladder appear unremarkable. Hysterectomy. There is mild thickened appearance of the gastric fundus, likely reactive to inflammatory changes of the pancreas. There is no evidence of bowel obstruction. Appendectomy. There  is aortoiliac atherosclerotic disease. There is a 2.7 cm infrarenal abdominal aortic ectasia. The origins of the celiac axis, SMA, IMA as well as the origins of the renal arteries are patent. The SMV and the main portal  vein are patent. There is focal narrowing of the proximal aspect of the splenic vein. The splenic vein however remains patent. There is no adenopathy. There is mild subcutaneous soft tissue edema of the abdominal wall. There is a 1.3 x 3.4 cm lytic lesion in the right acetabulum compatible with metastatic disease. Further evaluation with PET-CT is recommended. No acute fracture. IMPRESSION: Partially visualized small right pneumothorax status post chest tube placement. Partially visualized small left pleural effusion. Multiple pancreatic pseudocysts with no significant interval change since the prior study. There is abutment of the stomach with loss of fat plane between the stomach and the pancreas. Scattered colonic diverticula without active inflammation. No bowel obstruction. Lytic lesion in the right acetabulum compatible with metastatic disease. Further evaluation with PET-CT recommended. Electronically Signed   By: Anner Crete M.D.   On: 10/20/2015 01:12  Mr Abdomen Mrcp Wo Cm  Result Date: 10/26/2015 CLINICAL DATA:  Acute pancreatitis and history of pseudocysts. EXAM: MRI ABDOMEN WITHOUT CONTRAST  (INCLUDING MRCP) TECHNIQUE: Multiplanar multisequence MR imaging of the abdomen was performed. Heavily T2-weighted images of the biliary and pancreatic ducts were obtained, and three-dimensional MRCP images were rendered by post processing. COMPARISON:  CT scan 10/19/2015 FINDINGS: Examination is quite limited due to respiratory motion. The lungs demonstrate severe lung disease along with the pleural effusions. The heart is mildly enlarged. Benign-appearing hepatic cysts are again demonstrated. No worrisome hepatic lesions or intrahepatic biliary dilatation. The gallbladder surgically  absent. Normal caliber and course of the common bile duct. No obvious common bile ducts stones. The pancreatic duct is also normal in caliber and has a normal course. No pancreatic divisum. In the midbody region of the pancreas I do not see the pancreatic duct. I assume it is mildly compressed by acute inflammation/ pancreatitis. I do not see an obvious mass and I do not see any mass lesion the prior CT scans. There is fluid around the pancreas consistent with pancreatitis. Small pseudocysts are again noted. No mesenteric or retroperitoneal mass or adenopathy. IMPRESSION: 1. Very limited study due to respiratory motion. 2. Normal caliber and course of the common bile duct. No common bile duct stones. 3. Findings consistent with pancreatitis. No mass and no evidence of pancreatic divisum. 4. Stable hepatic cysts. 5. Extensive bilateral lung disease and effusions. Electronically Signed   By: Marijo Sanes M.D.   On: 10/26/2015 13:48  Dg Chest Port 1 View  Result Date: 10/29/2015 CLINICAL DATA:  Respiratory failure EXAM: PORTABLE CHEST 1 VIEW COMPARISON:  Yesterday FINDINGS: Stable right basilar pneumothorax which may be ex vacuo, 10% or less. Stable positioning of right-sided chest tube. Small left pleural effusion with lateral loculation. Interstitial opacity asymmetric to the right lung, gradually increased over the previous studies. Normal heart size. Stable positioning of right IJ catheter. IMPRESSION: 1. Unchanged small right basilar pneumothorax. 2. Unchanged small left pleural effusion with lateral loculation. 3. Gradual increase in interstitial opacity, question edema. Electronically Signed   By: Monte Fantasia M.D.   On: 10/29/2015 08:10   Dg Chest Port 1 View  Result Date: 10/28/2015 CLINICAL DATA:  Respiratory failure. Shortness of breath. Question increase in pneumothorax. EXAM: PORTABLE CHEST 1 VIEW COMPARISON:  10/25/2015 FINDINGS: Enteric tube remains in place, tip below the diaphragm not  included in the field of view. Tip of the right central line in the mid SVC. Right chest tube remains in place directed towards the lung apex. Small primarily inferior right pneumothorax is unchanged from prior. Persistent asymmetric opacity  with right increase compared to left, unchanged. Left pleural effusion and atelectasis unchanged. The mediastinal contours are unchanged. IMPRESSION: 1. Unchanged small right inferior pneumothorax with right chest tube in place. No increased size of the pneumothorax compared to prior exams. 2. Unchanged left pleural effusion and basilar opacity. 3. Unchanged diffuse interstitial prominence of the right hemithorax. Electronically Signed   By: Jeb Levering M.D.   On: 10/28/2015 03:48   Dg Chest Port 1 View  Result Date: 10/25/2015 CLINICAL DATA:  Chest tube in place EXAM: PORTABLE CHEST 1 VIEW COMPARISON:  October 24, 2015 FINDINGS: A right-sided chest tube remains in place. A small pneumothorax remains in the right base, unchanged. Increased interstitial markings in the right lung versus the left are mildly more prominent in the interval, possibly due to technique. A layering effusion and underlying atelectasis on the left remain. Support apparatus is stable. IMPRESSION: 1. Stable support apparatus including a right chest tube. Lucency in the right costophrenic angle consistent with persistent but stable basilar pneumothorax. 2. Increased interstitial markings throughout the right lung versus the left are mildly more prominent in the interval, possibly technical in nature. 3. Small left effusion and underlying atelectasis. Electronically Signed   By: Dorise Bullion III M.D   On: 10/25/2015 08:52  Dg Chest Port 1 View  Result Date: 10/24/2015 CLINICAL DATA:  Pneumothorax.  Chest tube . EXAM: PORTABLE CHEST 1 VIEW COMPARISON:  10/23/2015. FINDINGS: Lines and tubes including right chest tube in stable position. Stable small right lung base pneumothorax. Heart size stable.  Persistent mild interstitial prominence noted a right lung. Persistent small bilateral pleural effusions. No interim change. Mild right chest wall subcutaneous emphysema . IMPRESSION: 1. Lines and tubes including right chest tube in stable position. Stable right basilar tiny pneumothorax. Mild right chest wall subcutaneous emphysema. 2. Persistent mild interstitial prominence right lung. Persistent small bilateral pleural effusions. Electronically Signed   By: Marcello Moores  Register   On: 10/24/2015 07:19  Dg Chest Port 1 View  Result Date: 10/23/2015 CLINICAL DATA:  Pneumothorax.  Chest tube . EXAM: PORTABLE CHEST 1 VIEW COMPARISON:  10/22/2015 . FINDINGS: Right IJ line and feeding tube in stable position. Right chest tube in stable position. Stable tiny right base pneumothorax. Persistent mild right lung interstitial prominence and left base subsegmental atelectasis. Persistent small bilateral pleural effusions . Heart size stable. IMPRESSION: 1. Lines and tubes including right chest tube in stable position. Persistent small right basilar pneumothorax. 2. Persistent interstitial prominence right lung. Persistent subsegmental atelectasis left base. Persistent small bilateral pleural effusions. Chest is unchanged prior exam. Electronically Signed   By: Marcello Moores  Register   On: 10/23/2015 07:50  Dg Chest Port 1 View  Result Date: 10/22/2015 CLINICAL DATA:  Shortness of breath EXAM: PORTABLE CHEST 1 VIEW COMPARISON:  10/22/2015 FINDINGS: Feeding tube coursing below the diaphragm. Right IJ central venous catheter projecting over the cavoatrial junction. Right-sided chest tube in unchanged position. Persistent small right basilar pneumothorax. Small right pleural effusion. Interstitial thickening of the right lung. No focal consolidation. Small left pleural effusion. No left pneumothorax. Stable cardiomediastinal silhouette. IMPRESSION: 1. Right-sided chest tube with persistent small right hydropneumothorax. 2. Small  left pleural. Electronically Signed   By: Kathreen Devoid   On: 10/22/2015 19:27  Dg Chest Port 1 View  Result Date: 10/22/2015 CLINICAL DATA:  Chest tube. EXAM: PORTABLE CHEST 1 VIEW COMPARISON:  10/21/2015. FINDINGS: Interim placement of feeding tube. Its tip is below the hemidiaphragm. Right IJ line right and right  chest tube in stable position. Small right basilar pneumothorax again noted. Low lung volumes with bibasilar atelectasis and/or infiltrate. Small bilateral pleural effusions again noted. Stable cardiomegaly. IMPRESSION: 1. Interim placement of feeding tube, its tip is below left hemidiaphragm. Right IJ line right chest tube in stable position. 2.  Stable small right basilar pneumothorax. 3. Persistent bibasilar atelectasis and/or infiltrates. Small bilateral pleural effusions. Electronically Signed   By: Marcello Moores  Register   On: 10/22/2015 07:31  Dg Chest Port 1 View  Result Date: 10/21/2015 CLINICAL DATA:  Chest tube. EXAM: PORTABLE CHEST 1 VIEW COMPARISON:  10/20/2015. FINDINGS: Right IJ line and right chest tube in stable position. Small right basilar pneumothorax remains unchanged. Mild bibasilar atelectasis. Small left pleural effusion unchanged. Heart size normal. No acute bony abnormality. IMPRESSION: 1. Right chest tube in stable position. Small right basilar pneumothorax again noted. No interim change. Right IJ line in stable position. 2. Mild bibasilar atelectasis with stable small left pleural effusion again noted. Electronically Signed   By: Marcello Moores  Register   On: 10/21/2015 07:47  Dg Chest Port 1 View  Result Date: 10/20/2015 CLINICAL DATA:  Patient with right chest tube and right-sided chest pain. EXAM: PORTABLE CHEST 1 VIEW COMPARISON:  Chest radiograph 10/19/2015. FINDINGS: Right IJ central venous catheter tip projects over the superior vena cava. Right chest tube remains in place. Multiple monitoring leads overlie the patient. Stable cardiac and mediastinal contours. Small left  pleural effusion with underlying basilar opacities. Minimal atelectasis right lung base. Unchanged small tiny right basilar pneumothorax. IMPRESSION: Right chest tube remains in place with tiny right basilar pneumothorax are unchanged. Small left pleural effusion with underlying opacities favored to this represent atelectasis. Electronically Signed   By: Lovey Newcomer M.D.   On: 10/20/2015 07:25  Dg Chest Port 1 View  Result Date: 10/19/2015 CLINICAL DATA:  Follow-up pneumothorax. EXAM: PORTABLE CHEST 1 VIEW COMPARISON:  October 18, 2015 FINDINGS: A tiny right basilar lateral pneumothorax remains unchanged. The right chest tube is stable as is the right IJ. The cardiomediastinal silhouette is unchanged given difference in positioning. The increased prominence of the ascending thoracic aorta is probably positional. There is a small effusion and atelectasis on the left. No pneumothorax on the left. No other changes. IMPRESSION: 1. Stable right chest tube. The tiny right basilar pneumothorax remains. No other changes. Electronically Signed   By: Dorise Bullion III M.D   On: 10/19/2015 11:32  Dg Chest Port 1 View  Result Date: 10/18/2015 CLINICAL DATA:  Right pleural effusion, chest tube, chest tightness under sternum and generalized diffuse pain. EXAM: PORTABLE CHEST 1 VIEW COMPARISON:  09/29/2015 and CT chest 10/03/2015. FINDINGS: Right IJ central line tip projects over the SVC. Right chest tube remains in place. Tiny amount of pleural air at the base of the right hemi thorax, as before. Mild interstitial prominence in the right hemi thorax with a rounded opacity in the right infrahilar region. Minimal linear subsegmental atelectasis at the base of the left hemi thorax. Probable tiny left pleural effusion. Biapical pleural thickening. IMPRESSION: 1. Small residual basilar right pneumothorax with right chest tube in place. 2. Rounded opacity in the right infrahilar region may represent loculated pleural fluid.  Continued attention on followup exams is warranted. 3. Mild interstitial prominence in the right lung, possibly atelectasis or septal thickening. 4. Probable tiny left pleural effusion. Electronically Signed   By: Lorin Picket M.D.   On: 10/18/2015 10:35   Dg Chest Port 1 View  Result Date:  09/30/2015 CLINICAL DATA:  Status post VATS. EXAM: PORTABLE CHEST 1 VIEW COMPARISON:  10/15/2015 FINDINGS: Interval VATS procedure. Right sided chest tube in satisfactory position. Small loculated right basilar pneumothorax. No right pleural effusion. Small left pleural effusion. Bilateral mild interstitial thickening. No focal consolidation. Stable cardiomediastinal silhouette. Right jugular central venous catheter with the tip projecting over the SVC. The osseous structures are unremarkable. IMPRESSION: 1. Interval VATS procedure. Right sided chest tube in satisfactory position. Small loculated right basilar pneumothorax. Electronically Signed   By: Kathreen Devoid   On: 10/02/2015 10:23   Dg Chest Port 1 View  Result Date: 10/05/2015 CLINICAL DATA:  Shortness of breath, weakness, chronic pleural effusion EXAM: PORTABLE CHEST 1 VIEW COMPARISON:  10/05/2015 at 0850 hours FINDINGS: Moderate right and small left pleural effusions. Associated right lower lobe opacity, likely atelectasis. No pneumothorax. The heart is normal in size. IMPRESSION: Moderate right and small left pleural effusions. Electronically Signed   By: Julian Hy M.D.   On: 10/05/2015 21:42   Dg Chest Port 1 View  Result Date: 10/04/2015 CLINICAL DATA:  Right-sided hydropneumothorax EXAM: PORTABLE CHEST 1 VIEW COMPARISON:  Chest radiograph and chest CT October 03, 2015 FINDINGS: There is fluid and airspace consolidation in the right base. Pneumothorax is not evident on current examination. A smaller amount of fluid and atelectasis/consolidation is noted in the left base. Heart is upper normal in size with pulmonary vascularity within normal limits. No  adenopathy. Upper lung zones are clear except for minimal right upper lobe atelectasis which is stable. IMPRESSION: Bibasilar airspace consolidation and effusions, more pronounced on the right than on the left. The pneumothorax noted 1 day prior in the right basilar region is not apparent on this current portable examination. Lungs elsewhere clear except for minimal atelectasis in the right upper lobe which is stable. Stable cardiac silhouette. Electronically Signed   By: Lowella Grip III M.D.   On: 10/04/2015 07:16   Dg Abd Portable 1v  Result Date: 10/24/2015 CLINICAL DATA:  Current history of pancreatitis for which the patient is receiving jejunal tube feedings. The patient is currently not tolerating the tube feedings very well and has not had a bowel movement in approximately 36 hr. EXAM: PORTABLE ABDOMEN - 1 VIEW COMPARISON:  Abdomen x-ray 10/21/2015, 10/18/2015. CT abdomen and pelvis 10/19/2015. FINDINGS: Bowel gas pattern unremarkable without evidence of obstruction or significant ileus. Feeding tube tip not included on the image but the tube courses through the stomach and duodenum into the proximal jejunum. Surgical clips in the right upper quadrant from prior cholecystectomy. No visible opaque urinary tract calculi. IMPRESSION: No acute abdominal abnormality. Electronically Signed   By: Evangeline Dakin M.D.   On: 10/24/2015 11:25  Dg Abd Portable 1v  Result Date: 10/18/2015 CLINICAL DATA:  Left lower quadrant abdominal discomfort EXAM: PORTABLE ABDOMEN - 1 VIEW COMPARISON:  CT abdomen pelvis dated 09/29/2015 FINDINGS: Nonobstructive bowel gas pattern. Cholecystectomy clips. Mild degenerative changes the lumbar spine. IMPRESSION: Unremarkable abdominal radiograph. Electronically Signed   By: Julian Hy M.D.   On: 10/18/2015 23:48   Dg Abd Portable 2v  Result Date: 10/26/2015 CLINICAL DATA:  Abdominal distention, pain. EXAM: PORTABLE ABDOMEN - 2 VIEW COMPARISON:  10/24/2015.  FINDINGS: Enteric feeding tube lies within the small bowel. RIGHT chest tube is observed, with basilar pneumothorax, roughly stable from visible priors. Lateral decubitus radiograph demonstrates no free air over the liver. Bowel gas pattern is nonobstructive. IMPRESSION: No acute intra-abdominal findings. RIGHT basilar pneumothorax. Electronically Signed  By: Staci Righter M.D.   On: 10/26/2015 07:52  US Thoracentesis Asp Pleural Space W/img Guide  Result Date: 10/02/2015 INDICATION: Patient with recurrent right pleural effusion. Request is made for repeat diagnostic and therapeutic thoracentesis. EXAM: ULTRASOUND GUIDED DIAGNOSTIC AND THERAPEUTIC THORACENTESIS MEDICATIONS: 1% lidocaine with epinephrine. COMPLICATIONS: None immediate. PROCEDURE: An ultrasound guided thoracentesis was thoroughly discussed with the patient and questions answered. The benefits, risks, alternatives and complications were also discussed. The patient understands and wishes to proceed with the procedure. Written consent was obtained. Ultrasound was performed to localize and mark an adequate pocket of fluid in the right chest. The area was then prepped and draped in the normal sterile fashion. 1% Lidocaine with epi was used for local anesthesia. Under ultrasound guidance a Safe-T-Centesis catheter was introduced. Thoracentesis was performed. The catheter was removed and a dressing applied. FINDINGS: A total of approximately 0.93 L of yellow fluid was removed. Samples were sent to the laboratory as requested by the clinical team. IMPRESSION: Successful ultrasound guided right thoracentesis yielding 0.93 L of pleural fluid. Read by: Saverio Danker, PA-C Electronically Signed   By: Lucrezia Europe M.D.   On: 10/02/2015 16:06   US Thoracentesis Asp Pleural Space W/img Guide  Result Date: 09/29/2015 INDICATION: Patient with history of pancreatitis with pseudocyst formation, Wegener's granulomatosis, COPD, right pleural effusion. Request made  for diagnostic and therapeutic right thoracentesis. EXAM: ULTRASOUND GUIDED DIAGNOSTIC AND THERAPEUTIC RIGHT THORACENTESIS MEDICATIONS: None. COMPLICATIONS: None immediate. PROCEDURE: An ultrasound guided thoracentesis was thoroughly discussed with the patient and questions answered. The benefits, risks, alternatives and complications were also discussed. The patient understands and wishes to proceed with the procedure. Written consent was obtained. Ultrasound was performed to localize and mark an adequate pocket of fluid in the right chest. The area was then prepped and draped in the normal sterile fashion. 1% Lidocaine was used for local anesthesia. Under ultrasound guidance a Safe-T-Centesis catheter was introduced. Thoracentesis was performed. The catheter was removed and a dressing applied. FINDINGS: A total of approximately 1.4 liters of yellow fluid was removed. Samples were sent to the laboratory as requested by the clinical team. IMPRESSION: Successful ultrasound guided diagnostic and therapeutic right thoracentesis yielding 1.4 liters of pleural fluid. Read by: Rowe Robert, PA-C Electronically Signed   By: Corrie Mckusick D.O.   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 We reviewed the pathology report and imaging studies. Whether the patient has stage IV metastatic lung cancer or pancreatic cancer, she is too weak and debilitated for systemic chemotherapy. The role of chemotherapy would be strictly palliative with potential significant side effects The patient is currently suffering from  uncontrolled pain She has unstable vital signs with high risk of cardiovascular failure requiring significant intervention After significant discussion with her daughters and the patient herself, we are in agreement to change her CODE STATUS to DO NOT RESUSCITATE We have discussed the goals of care would be to transition her to comfort measures The plan above is fully discussed with the primary service, the patient and family members Palliative care consult is placed  All questions were answered. The patient knows to call the clinic  with any problems, questions or concerns. I spent 40 minutes counseling the patient face to face. The total time spent in the appointment was 60 minutes and more than 50% was on counseling.     [facility], Tanara Turvey, MD 10/29/2015 12:47 PM

## 2015-10-30 ENCOUNTER — Encounter: Payer: Self-pay | Admitting: *Deleted

## 2015-10-30 ENCOUNTER — Other Ambulatory Visit: Payer: Self-pay | Admitting: *Deleted

## 2015-10-30 ENCOUNTER — Ambulatory Visit: Payer: Medicare Other | Admitting: Pulmonary Disease

## 2015-10-30 MED ORDER — SALINE SPRAY 0.65 % NA SOLN
1.0000 | NASAL | Status: DC | PRN
  Administered 2015-10-30: 1 via NASAL
  Filled 2015-10-30: qty 44

## 2015-10-30 MED ORDER — FENTANYL BOLUS VIA INFUSION
100.0000 ug | INTRAVENOUS | Status: DC | PRN
  Administered 2015-10-30 – 2015-10-31 (×10): 100 ug via INTRAVENOUS
  Filled 2015-10-30 (×2): qty 100

## 2015-10-30 MED ORDER — SODIUM CHLORIDE 0.9 % IV SOLN: INTRAVENOUS | Status: DC

## 2015-10-30 MED ORDER — GUAIFENESIN ER 600 MG PO TB12
1200.0000 mg | ORAL_TABLET | Freq: Two times a day (BID) | ORAL | Status: DC
  Administered 2015-10-30: 1200 mg via ORAL
  Filled 2015-10-30: qty 2

## 2015-10-30 MED ORDER — FENTANYL BOLUS VIA INFUSION
75.0000 ug | INTRAVENOUS | Status: DC | PRN
  Administered 2015-10-30 (×5): 75 ug via INTRAVENOUS
  Filled 2015-10-30: qty 75

## 2015-10-30 NOTE — Progress Notes (Signed)
PROGRESS NOTE    Sherida Irene Zapien  MRN:[id number] DOB: 12/12/1945 DOA: 10/10/2015 PCP: Stacy J Burns, MD   Brief Narrative:  [name] [name] is an 70 y.o. [facility] PMHx Depression, Anxiety COPD, Tobacco abuse, RLL lung cavitary mass Recurrent Enlarging Rt Hydropneumothorax S/P:right thoracentesis 7/3 removing 1.4 L Exudative fluid/.Repeat thoracentesis on 7/6 removed 1L of the same fluid . 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 and a recent admission due to acute pancreatitis with pseudocyst.  07/24/2015 she underwent limited autoimmune workup that showed screening p-ANCA positive (no c-anca, pr3 or mpo avail). Then 08/06/15 she had full panel with Dr Hawkes and per phone call PR-3 and MPO antibody results were NEGATIVE and only P-ANCA was trace posiotive. 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 Patient then committed to high-dose prednisone and also Rituxan once a week for 4 weeks infusion. She completed 3 weeks of this infusion he did she was supposed to get her fourth dose a little 10/01/2015 on 10/24/2015 but she missed it because of illness.  She believes ever since starting high-dose prednisone she started developing abdominal pain. A CT abdomen 09/23/2015 showed the right lower lobe mass at 2.6 cm perhaps smaller but obscured by a right pleural effusion that was small. But she had acute pancreatitis features with small pseudocyst. She blames the high-dose prednisone for her current problems. She does not think Rituxan is the causative agent for this.   She reports she started developing abdominal pain and finally was admitted 09/28/2015. Since admission the right pleural effusion got worse and she had a thoracentesis 09/29/2015 that showed idiopathic exudate with acute inflammatory cells and mesothelial cells with an LDH of 400. Thoracentesis was repeated 10/02/2015 with an LDH of 336. This was complicated by small right  basilar pneumothorax on chest x-ray 10/03/2015 which radiology is conservatively following. She was discharged from [facility] on 10/07/15   Subjective: 8/3  sleepy but arousable, answers questions appropriately, follows commands   Assessment & Plan:   Active Problems:   Pleural effusion, right   Pleural effusion associated with pancreatitis   Pneumothorax   Acute respiratory failure with hypoxia (HCC)   Abdominal discomfort   LUQ pain   Protein-calorie malnutrition (HCC)   Pancreatitis   SOB (shortness of breath)   Abdominal bloating   Sinus tachycardia (HCC)   Anxiety state   Vasculitis (HCC)   Abdominal distension   Metastatic carcinoma (HCC)   Palliative care encounter    Right Lower Lobe & Left Upper Lobe Masses: -Treated as ANCA (-) vasculitis (possible Wegener's dse) with Prednisone and Rituximab in June 2017.  -RLL pleura/lung final pathology is still pending. Pathologist is doing more stains. He thinks there is carcinoma in RLL specimen. -Right chest tube in place causing significant pain, continue Fentanyl PCA -Dr. Elizabeth L Golding Palliative Care: Patient and family has agreed to be FULL COMFORT CARE. We have agreed that patient will stay in [facility].  COPD:  - Pulmicort nebulizer BID.  - Continuing Xopenex nebs q6hr scheduled  -Titrate O2 to maintain SPO2 89-93% -NOTE; patient unable to use inhaler as she cannot/will not take a deep breath.   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 Concern for cystic neoplasm (Pancreas) -increased size and # of peripancreatic fluid collections unable to rule out cystic neoplasm. CA-19-9 is 535, normal CEA. - GI following > eventual MRI/MRCP vs EUS as an oupt per GI.  METASTATIC POORLY   DIFFERENTIATED CARCINOMA -Long family meeting with sisters, counseled very poor outcome likely. They requested that some of her anxiety medication be reduced in order  that she can better participate in her medical decisions. Deferred changing CODE STATUS until oncology has seen patient. -Oncology; unable to tolerate chemotherapy/radiation. No further useful treatment..  Possible lytic lesion in Rt acetabulum  -not seen in previous ct scan or PET scan.   Coronary Artery Disease and HTN:  - Continuing ASA 81mg daily  Sinus tachycardia -most likely multifactorial to include anxiety, pain from chest tube/ VATS. -Fentanyl PCA --See anxiety/depression  Anxiety/Depression - Ativan to mg QID (note patient on Xanax TID at home)  Tobacco abuse disorder - Cessation counseling prior to discharge -Nicotine patch 21 mg  Hemochromatosis -treated with Phlebotomy (last on 06/16/2015), followed by Dr Gorsuch . Folate processing, B12 normal.   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 Discontinued  7/5?  - P-ANCA elevated at 1:1.6 on 07/23/15, normal at <1:20 on 10/03/15. Receiving weekly Rituxan infusions through rheumatologist, Dr Hawks  Chronic diarrhea,  -suspected post cholecystectomy related. ?IBS-D. currently not having issues with diarrhea.  -Colonoscopy 06/2014: benign cecal polyp, normal random biopsies, non-bleeding cecal AVM.   Hypokalemia   Hypomagnesemia    Goals of care -Spoke with daughter who understands most likely the stomach pancreatic cancer but would like to hold on telling mother (very anxious/depressed) until final pathology report. -8/1 Family meeting. Daughters do not want to change patient's CODE STATUS at this time. Requested reduction in anxiety medication in order that patient's mentation clear. They understand that patient's anxiety level will increase proportionally. Will wait on making decisions 1 changing CODE STATUS, palliative care/hospice until oncology has seen patient on 8/2.    DVT prophylaxis:  Code  Status: Full comfort care Family Communication: Large portion of her family Disposition Plan: Patient and family has agreed to be FULL COMFORT CARE. We have agreed that patient will stay in [facility].   Consultants:  Dr.Elizabeth L Golding palliative care Dr.Ni Gorsuch oncology Dr.Bryan K Bartle cardiothoracic surgery Dr.Steven Paul Armbruster [place] [facility]    Procedures/Significant Events:  March/2017 CT of the abdomen -3.5 cm RLL lung cavitary mass.  4/10 PET scan;  lesion to be hypermetabolic with an SUV max of 10.0. -Multiple areas of right sided hypermetabolic pleural thickening and nodularity suspicious for metastatic disease. -Lt posterior 7th rib hypermetabolism and probable lucency suspicious for isolated bone met. 4/20 CT guided Biopsy of the RLL lung lesion negative for malignancy, positive vasculitic features. CT of the abdomen on 6/27 - RLL lung mass/ small right pleural effusion.  -Acute pancreatitis with a small pseudocyst    09/16/15 TTE:Normal LV size with mild LV hypertrophy, EF 65-70%. Normal RV size and systolic function. No significant valvular abnormalities. 7/07 CT Chest: moderate Rt hydropneumothorax, abnormal pleural thickening with scattered areas of pleural fluid in the Rt hemithorax, additional subpleural reticulation / fibrosis with interlobular septal thickening R>L, fluid along the pancreatic body/tail related to known acute pancreatitis 7/03 Cytology from [facility]:acute inflammation, no malignant cells 7/07 Cytology from [facility]:no malignant cells 7/21 S/P right VATS: Loculated Recurrent Right Exudative Pleural Effusionc/w empyema: RIGHT Pleura, biopsy, 2/2 biopsy positive METASTATIC POORLY DIFFERENTIATED CARCINOMA -7/26 GI Note* Pancreatitis of unclear etiology. 7/3 and7/24 CT scans (c/w 09/23/15 CT increased size and # of peripancreatic fluid collections unable to rule out cystic neoplasm. CA-19-9 is 535, normal CEA. Raises question of cystic  neoplasm.    Initial clinical presentation was 08/2015. Previous cholecystectomy 2011. Left abdominal pain improved but persists along with nausea and anorexia. Likely needsanother PET scan as outpt.      Cultures Urine Ctx 7/2: Recommended recollection Urine Ctx 7/3: Recommended recollection R Pleural Fluid 7/3: Negative R Pleural Fluid 7/6: Negative MRSA PCR 7/20: Negative R Pleural Fluid 7/21> neg gm stain>> R Parietal Pleural Biopsy 7/21>neg    Antimicrobials: Cefuroxime 7/20 - 7/21 (surgical prophylaxis)    Devices                         LINES / TUBES:  7/21 right chest tube 59F>>     Continuous Infusions: . dextrose 5 % and 0.9% NaCl 20 mL/hr at 10/29/15 1800  . fentaNYL infusion INTRAVENOUS 75 mcg/hr (10/30/15 0842)     Objective: Vitals:   10/30/15 0800 10/30/15 0900 10/30/15 1000 10/30/15 1100  BP:      Pulse: (!) 129 (!) 124 (!) 125 (!) 131  Resp: (!) 34 (!) 23 (!) 23 (!) 25  Temp:      TempSrc:      SpO2: (!) 82% 98% 97% 93%  Weight:      Height:        Intake/Output Summary (Last 24 hours) at 10/30/15 1423 Last data filed at 10/30/15 1100  Gross per 24 hour  Intake            918.9 ml  Output              495 ml  Net            423.9 ml   Filed Weights   10/27/15 0500 10/27/15 1803 10/28/15 0500  Weight: 71.7 kg (158 lb) 71.2 kg (157 lb) 71.5 kg (157 lb 10.1 oz)    Examination:  General: Sleepy but arousable and A/O 4, positive acute on chronic respiratory distress Eyes: negative scleral hemorrhage, negative anisocoria, negative icterus ENT: Negative Runny nose, negative gingival bleeding, Neck:  Negative scars, masses, torticollis, lymphadenopathy, JVD, right IJ CVL covered and clean, Lungs: negative breath sounds are LL/RML, positive expiratory wheezing left lung field and RUL, negative, right chest tube in place with serosanguineous fluid + continued air leak Cardiovascular: Tachycardic, Regular rhythm without  murmur gallop or rub normal S1 and S2 Abdomen: Positive abdominal pain, positive distention, hypoactive bowel sounds, no rebound, no ascites, no appreciable mass Extremities: No significant cyanosis, clubbing, or edema bilateral lower extremities Skin: Negative rashes, lesions, ulcers Psychiatric:  Negative depression, negative anxiety, negative fatigue, negative mania  Central nervous system:  Cranial nerves II through XII intact, tongue/uvula midline, all extremities muscle strength 5/5, sensation intact throughout,negative dysarthria, negative expressive aphasia, negative receptive aphasia. .     Data Reviewed: Care during the described time interval was provided by me .  I have reviewed this patient's available data, including medical history, events of note, physical examination, and all test results as part of my evaluation. I have personally reviewed and interpreted all radiology studies.  CBC:  Recent Labs Lab 10/25/15 1320 10/25/15 1625 10/26/15 0348 10/29/15 0422  WBC 10.6*  --  11.4* 16.5*  NEUTROABS  --   --  7.3  --   HGB 8.6*  --  8.8* 8.6*  HCT 28.2* 27.5* 29.3* 28.4*  MCV 100.7*  --  102.4* 101.1*  PLT 241  --  255 063   Basic Metabolic Panel:  Recent Labs Lab 10/25/15 1320 10/25/15 1543 10/25/15 1958 10/26/15  0348 10/27/15 0505 10/28/15 0500 10/29/15 0422  NA 138  --   --  137 138 138 137  K 2.9*  --  3.4* 3.4* 3.4* 3.3* 3.8  CL 104  --   --  102 104 104 102  CO2 27  --   --  30 27 28 28  GLUCOSE 124*  --   --  107* 149* 160* 106*  BUN 11  --   --  13 14 10 8  CREATININE 0.36*  --   --  0.38* 0.36* 0.31* <0.30*  CALCIUM 8.0*  --   --  8.4* 8.1* 7.8* 8.3*  MG  --  1.9  --   --  1.9 1.7 2.0   GFR: CrCl cannot be calculated (This lab value cannot be used to calculate CrCl because it is not a number: <0.30). Liver Function Tests:  Recent Labs Lab 10/25/15 1320  AST 14*  ALT 15  ALKPHOS 78  BILITOT 0.5  PROT 4.2*  ALBUMIN 1.8*   No results for  input(s): LIPASE, AMYLASE in the last 168 hours. No results for input(s): AMMONIA in the last 168 hours. Coagulation Profile: No results for input(s): INR, PROTIME in the last 168 hours. Cardiac Enzymes: No results for input(s): CKTOTAL, CKMB, CKMBINDEX, TROPONINI in the last 168 hours. BNP (last 3 results) No results for input(s): PROBNP in the last 8760 hours. HbA1C: No results for input(s): HGBA1C in the last 72 hours. CBG:  Recent Labs Lab 10/28/15 1617 10/28/15 1927 10/28/15 2306 10/29/15 0437 10/29/15 0843  GLUCAP 121* 130* 113* 111* 133*   Lipid Profile: No results for input(s): CHOL, HDL, LDLCALC, TRIG, CHOLHDL, LDLDIRECT in the last 72 hours. Thyroid Function Tests: No results for input(s): TSH, T4TOTAL, FREET4, T3FREE, THYROIDAB in the last 72 hours. Anemia Panel: No results for input(s): VITAMINB12, FOLATE, FERRITIN, TIBC, IRON, RETICCTPCT in the last 72 hours. Urine analysis:    Component Value Date/Time   COLORURINE YELLOW 09/29/2015 1148   APPEARANCEUR CLEAR 09/29/2015 1148   LABSPEC 1.007 09/29/2015 1148   PHURINE 5.5 09/29/2015 1148   GLUCOSEU NEGATIVE 09/29/2015 1148   GLUCOSEU NEGATIVE 07/23/2015 0901   HGBUR SMALL (A) 09/29/2015 1148   BILIRUBINUR NEGATIVE 09/29/2015 1148   BILIRUBINUR 3+ 05/21/2015 0932   KETONESUR NEGATIVE 09/29/2015 1148   PROTEINUR NEGATIVE 09/29/2015 1148   UROBILINOGEN 0.2 07/23/2015 0901   NITRITE NEGATIVE 09/29/2015 1148   LEUKOCYTESUR MODERATE (A) 09/29/2015 1148   Sepsis Labs: @LABRCNTIP(procalcitonin:4,lacticidven:4)  )No results found for this or any previous visit (from the past 240 hour(s)).       Radiology Studies: Dg Chest Port 1 View  Result Date: 10/29/2015 CLINICAL DATA:  Respiratory failure EXAM: PORTABLE CHEST 1 VIEW COMPARISON:  Yesterday FINDINGS: Stable right basilar pneumothorax which may be ex vacuo, 10% or less. Stable positioning of right-sided chest tube. Small left pleural effusion with lateral  loculation. Interstitial opacity asymmetric to the right lung, gradually increased over the previous studies. Normal heart size. Stable positioning of right IJ catheter. IMPRESSION: 1. Unchanged small right basilar pneumothorax. 2. Unchanged small left pleural effusion with lateral loculation. 3. Gradual increase in interstitial opacity, question edema. Electronically Signed   By: Jonathon  Watts M.D.   On: 10/29/2015 08:10        Scheduled Meds: . enoxaparin (LOVENOX) injection  40 mg Subcutaneous Q24H  . guaiFENesin  1,200 mg Oral BID  . levalbuterol  0.63 mg Nebulization Q6H  . metoprolol  5 mg Intravenous Q6H  .   nicotine  21 mg Transdermal Daily  . sodium chloride flush  3 mL Intravenous Q12H   Continuous Infusions: . dextrose 5 % and 0.9% NaCl 20 mL/hr at 10/29/15 1800  . fentaNYL infusion INTRAVENOUS 75 mcg/hr (10/30/15 0842)     LOS: 17 days    Time spent: 40 minutes    WOODS, CURTIS J, MD [facility] Pager [phone]   If 7PM-7AM, please contact night-coverage [email or web address] Password [email or web address] 10/30/2015, 2:23 PM      

## 2015-10-30 NOTE — Progress Notes (Signed)
RN tried calling palliative care, left another message with Rhea Pink, waiting on response. RN called MD Servando Snare about pain control due to no response from palliative care, he stated to give 51mcg fentanyl bolus Q15 minutes PRN, do not need to call if RN gives more than 3 doses in an hour.  Follow up with Palliative. RN will continue to monitor.

## 2015-10-30 NOTE — Progress Notes (Signed)
Palliative Medicine RN Note: Reviewed pt's medications with Dr Hilma Favors. Per MAR, pt has received 30 prn doses of fentanyl, for a total of 560 mcg since 1100 yesterday am  (less than 24 hours), which when combined with her basal rate totals 760 mcg in less than 24 hours.  Dr Hilma Favors gave new orders to increase fentanyl dose to address pt's unmanaged pain, so new basal rate is 75 mcg with 75 mcg bolus available q 15 minutes prn. PMT will follow up this afternoon to ensure comfort has been achieved.   Please call our team with any questions. A PMT provider is on duty during business hours and can be reached at [phone].  Marjie Skiff Amabel Stmarie, RN, BSN, [facility] 10/30/2015 8:42 AM Cell [phone] 8:00-4:00 Monday-Friday Office [phone]

## 2015-10-30 NOTE — Progress Notes (Addendum)
Patient has very poorly controlled pain and dyspnea- she has required 30 bolus doses over night and since drip titration earlier today 7 additional boluses- will increase her infusion to 161mg/hr and bolus to 1090m/hr- I have recopnciled orders to reflect full comfort. Will leave chest tube in for now- she is too unstable to move off her current unit. Anticipate hospital death 24-48 hours. Family does not want hospice to be involved in her care.Palliative team will see for support and management.  I met with family to confim goals and update the on care plan and palliative recommendations.  Time: 4PM-425PM Total: 25 min Greater than 50%  of this time was spent counseling and coordinating care related to the above assessment and plan.   ElLane HackerDO Palliative Medicine [phone]

## 2015-10-30 NOTE — Progress Notes (Signed)
Patient has received 3 boluses  of fentanyl x2 hours in addition fentanyl drip. No palliative care provider available or on call. RN called [facility] and they stated she was not [facility] patient. RN called palliative NP Romona Curls and left message in regards to no relief of pain and distress. RN will continue to monitor patient.

## 2015-10-30 NOTE — Progress Notes (Signed)
She is seen this morning Complaining of worsening/poorly controlled abdominal pain radiating to the back I have increased fentanyl continuous rate to 20 mcg/hour but kept the prn bolus at 20 mcg q 15 mins prn for now Would defer to palliative service to continue adjust her pain medication needs later today

## 2015-10-30 NOTE — Progress Notes (Signed)
Palliative Medicine RN Note: Team came in this am to missed pages, as there is not a PMT provider on overnight, which is what is reflected in [place]. Per chart, Bartle with Cardiothoracic surgery is primary for now, as pt is not GIP.   PMT will follow up this morning for symptoms.  Marjie Skiff Davena Julian, RN, BSN, [facility] 10/30/2015 8:28 AM Cell [phone] 8:00-4:00 Monday-Friday Office [phone]

## 2015-10-30 NOTE — Patient Outreach (Signed)
[facility]) Care Management  10/30/2015  [name] [name] [birth year] [id number]   [name]  with inpatient admission greater than 10 days, per [facility] care management  protocol will close case. In basket message has been sent to Hospital liaisons to notify them of case closure and to follow patient for further [facility] care management involvement needs.     Joylene Draft, RN, [place] Management [phone]- Mobile [phone]- Toll Free Main Office

## 2015-10-30 NOTE — Progress Notes (Signed)
      [address]       [employer] [address]             [phone]      13 Days Post-Op Procedure(s) (LRB): VIDEO ASSISTED THORACOSCOPY (VATS) with Drainage of Pleural Effusion (Right) DRAINAGE OF PLEURAL EFFUSION (Right) Subjective: conts with abdominal pain and generalized discomfort  Objective: Vital signs in last 24 hours: Temp:  [97.7 F (36.5 C)-97.8 F (36.6 C)] 97.8 F (36.6 C) (08/03 0700) Pulse Rate:  [118-122] 118 (08/03 0700) Cardiac Rhythm: Sinus tachycardia (08/03 0727) Resp:  [26-30] 30 (08/03 0700) BP: (138-143)/(77-92) 143/92 (08/03 0700) SpO2:  [97 %-100 %] 100 % (08/03 0740)  Hemodynamic parameters for last 24 hours:    Intake/Output from previous day: 08/02 0701 - 08/03 0700 In: 888.5 [I.V.:888.5] Out: 720 [Urine:450; Chest Tube:270] Intake/Output this shift: No intake/output data recorded.  General appearance: alert, distracted, fatigued and mild distress Heart: regular rate and rhythm and tachy Lungs: coarse BS Abdomen: + TTP LUQ  Lab Results:  Recent Labs  10/29/15 0422  WBC 16.5*  HGB 8.6*  HCT 28.4*  PLT 319   BMET:  Recent Labs  10/28/15 0500 10/29/15 0422  NA 138 137  K 3.3* 3.8  CL 104 102  CO2 28 28  GLUCOSE 160* 106*  BUN 10 8  CREATININE 0.31* <0.30*  CALCIUM 7.8* 8.3*    PT/INR: No results for input(s): LABPROT, INR in the last 72 hours. ABG    Component Value Date/Time   PHART 7.399 10/28/2015 0339   HCO3 30.1 (H) 10/28/2015 0339   TCO2 31.6 10/28/2015 0339   O2SAT 91.0 10/28/2015 0339   CBG (last 3)   Recent Labs  10/28/15 2306 10/29/15 0437 10/29/15 0843  GLUCAP 113* 111* 133*    Meds Scheduled Meds: . enoxaparin (LOVENOX) injection  40 mg Subcutaneous Q24H  . levalbuterol  0.63 mg Nebulization Q6H  . metoprolol  5 mg Intravenous Q6H  . nicotine  21 mg Transdermal Daily  . sodium chloride flush  3 mL Intravenous Q12H   Continuous Infusions: . dextrose 5 % and 0.9% NaCl 20  mL/hr at 10/29/15 1800  . fentaNYL infusion INTRAVENOUS 20 mcg/hr (10/30/15 0745)   PRN Meds:.[DISCONTINUED] acetaminophen **OR** acetaminophen, alum & mag hydroxide-simeth, antiseptic oral rinse, fentaNYL, [DISCONTINUED] glycopyrrolate **OR** [DISCONTINUED] glycopyrrolate **OR** glycopyrrolate, [DISCONTINUED] haloperidol **OR** [DISCONTINUED] haloperidol **OR** haloperidol lactate, LORazepam, [DISCONTINUED] ondansetron **OR** ondansetron (ZOFRAN) IV, phenol, polyethylene glycol, polyvinyl alcohol, potassium chloride, sodium chloride flush  Xrays Dg Chest Port 1 View  Result Date: 10/29/2015 CLINICAL DATA:  Respiratory failure EXAM: PORTABLE CHEST 1 VIEW COMPARISON:  Yesterday FINDINGS: Stable right basilar pneumothorax which may be ex vacuo, 10% or less. Stable positioning of right-sided chest tube. Small left pleural effusion with lateral loculation. Interstitial opacity asymmetric to the right lung, gradually increased over the previous studies. Normal heart size. Stable positioning of right IJ catheter. IMPRESSION: 1. Unchanged small right basilar pneumothorax. 2. Unchanged small left pleural effusion with lateral loculation. 3. Gradual increase in interstitial opacity, question edema. Electronically Signed   By: Monte Fantasia M.D.   On: 10/29/2015 08:10    Assessment/Plan: S/P Procedure(s) (LRB): VIDEO ASSISTED THORACOSCOPY (VATS) with Drainage of Pleural Effusion (Right) DRAINAGE OF PLEURAL EFFUSION (Right)  1 CT with small air leak - keep for now 2 comfort care /analgesia as able   LOS: 17 days    [name] [name] 10/30/2015

## 2015-10-30 NOTE — Progress Notes (Signed)
PMT RN Note: rec'd call from [employer]. Family requesting saline nasal spray and Mucinex. Obtained rx for both.  Marjie Skiff Verle Wheeling, RN, BSN, [facility] 10/30/2015 8:51 AM Cell [phone] 8:00-4:00 Monday-Friday Office [phone]

## 2015-10-30 NOTE — Progress Notes (Signed)
   10/30/15 1100  Clinical Encounter Type  Visited With Patient and family together  Visit Type Follow-up  Referral From Chaplain  Consult/Referral To Chaplain  Spiritual Encounters  Spiritual Needs Emotional  Stress Factors  Patient Stress Factors Loss of control  Family Stress Factors Loss of control  CHP followed up with patient.  Family was in the room and patient was asleep. Family didn't need anything at this time. CHP available to follow up as needed. Roe Coombs 10/30/15

## 2015-10-31 DIAGNOSIS — E440 Moderate protein-calorie malnutrition: Secondary | ICD-10-CM

## 2015-10-31 DIAGNOSIS — C801 Malignant (primary) neoplasm, unspecified: Secondary | ICD-10-CM

## 2015-10-31 MED ORDER — DEXAMETHASONE SODIUM PHOSPHATE 4 MG/ML IJ SOLN
4.0000 mg | Freq: Every day | INTRAMUSCULAR | Status: DC
Start: 2015-10-31 — End: 2015-11-03
  Administered 2015-10-31 – 2015-11-03 (×4): 4 mg via INTRAVENOUS
  Filled 2015-10-31 (×4): qty 1

## 2015-10-31 MED ORDER — LORAZEPAM 2 MG/ML IJ SOLN
0.5000 mg | Freq: Three times a day (TID) | INTRAMUSCULAR | Status: DC
  Administered 2015-10-31 – 2015-11-01 (×3): 0.5 mg via INTRAVENOUS
  Filled 2015-10-31 (×3): qty 1

## 2015-10-31 MED ORDER — BISACODYL 10 MG RE SUPP: 10.0000 mg | Freq: Every day | RECTAL | Status: DC | PRN

## 2015-10-31 MED ORDER — DEXTROSE-NACL 5-0.9 % IV SOLN
INTRAVENOUS | Status: DC
Start: 2015-10-31 — End: 2015-11-03
  Administered 2015-10-31 – 2015-11-02 (×2): via INTRAVENOUS

## 2015-10-31 MED ORDER — FENTANYL BOLUS VIA INFUSION
125.0000 ug | INTRAVENOUS | Status: DC | PRN
  Administered 2015-10-31: 125 ug via INTRAVENOUS
  Administered 2015-11-01: 150 ug via INTRAVENOUS
  Administered 2015-11-01 (×2): 125 ug via INTRAVENOUS
  Filled 2015-10-31: qty 125

## 2015-10-31 NOTE — Progress Notes (Addendum)
      [address]       [place] [address]             [phone]      14 Days Post-Op Procedure(s) (LRB): VIDEO ASSISTED THORACOSCOPY (VATS) with Drainage of Pleural Effusion (Right) DRAINAGE OF PLEURAL EFFUSION (Right)   Subjective:  Patient is in a lot of pain.  She states it is better after increase in her pain medication yesterday.  Her daughters are at bedside and states patient does not want to be asleep constantly.  They also state she is afraid to eat for fear that she will choke.  They asked if she could get some sort of IV nutrition.  Objective: Vital signs in last 24 hours: Temp:  [97.5 F (36.4 C)] 97.5 F (36.4 C) (08/04 0816) Pulse Rate:  [107-139] 139 (08/04 0816) Cardiac Rhythm: Sinus tachycardia (08/04 0748) Resp:  [17-28] 22 (08/04 0816) BP: (137)/(89) 137/89 (08/04 0816) SpO2:  [90 %-98 %] 91 % (08/04 0816)  Intake/Output from previous day: 08/03 0701 - 08/04 0700 In: 402.2 [I.V.:402.2] Out: 300 [Urine:300]  General appearance: alert, cooperative and no distress Heart: regular rate and rhythm and tachy Lungs: coarse BS Wound: clean and dr  Lab Results:  Recent Labs  10/29/15 0422  WBC 16.5*  HGB 8.6*  HCT 28.4*  PLT 319   BMET:  Recent Labs  10/29/15 0422  NA 137  K 3.8  CL 102  CO2 28  GLUCOSE 106*  BUN 8  CREATININE <0.30*  CALCIUM 8.3*    PT/INR: No results for input(s): LABPROT, INR in the last 72 hours. ABG    Component Value Date/Time   PHART 7.399 10/28/2015 0339   HCO3 30.1 (H) 10/28/2015 0339   TCO2 31.6 10/28/2015 0339   O2SAT 91.0 10/28/2015 0339   CBG (last 3)   Recent Labs  10/28/15 2306 10/29/15 0437 10/29/15 0843  GLUCAP 113* 111* 133*    Assessment/Plan: S/P Procedure(s) (LRB): VIDEO ASSISTED THORACOSCOPY (VATS) with Drainage of Pleural Effusion (Right) DRAINAGE OF PLEURAL EFFUSION (Right)  1. Chest tube- 1+ air leak with cough, CXR is stable... I advised against chest tube  removal as the likely development of pneumothorax and patient would not be able to tolerate a pneumothorax or another chest tube placement 2. Nutrition- will defer to palliative care who is managing patient's end of life care 3. Dispo- leave chest tube in place, care per pallitative   LOS: 18 days    BARRETT, ERIN 10/31/2015  Chart reviewed, patient examined, agree with above. The air leak is small and intermittent but has not stopped yet. I would agree with keeping the chest tube for now.

## 2015-10-31 NOTE — Progress Notes (Signed)
[name] [name]   DOB:30-Jun-1945   [id number]    Subjective: Her pain is better controlled with increased fentanyl. She feels hungry and would like to eat. She complained of persistent shortness of breath The patient ruminates about our prior encounter in the clinic 2 years ago. She also repeatedly asked why is her cancer not discovered earlier but she comes to the conclusion that she is at peace with the plan to stay with comfort measures She complained of dry nasal passages with occasional blood when she tried to call for sneeze  Objective:  Vitals:   10/31/15 0600 10/31/15 0816  BP:  137/89  Pulse: (!) 128 (!) 139  Resp: 17 (!) 22  Temp:  97.5 F (36.4 C)     Intake/Output Summary (Last 24 hours) at 10/31/15 1038 Last data filed at 10/31/15 0600  Gross per 24 hour  Intake            376.8 ml  Output              300 ml  Net             76.8 ml    GENERAL:alert, Shortness of breath at baseline, worse when she tries to talk SKIN: skin color, texture, turgor are normal, no rashes or significant lesions EYES: normal, Conjunctiva are pink and non-injected, sclera clear OROPHARYNX: Dry mucous membranes  NECK: supple, thyroid normal size, non-tender, without nodularity LYMPH:  no palpable lymphadenopathy in the cervical, axillary or inguinal LUNGS: Bilateral crackles with increased breathing effort  HEART: Tachycardia without murmurs ABDOMEN:abdomen soft, non-tender and normal bowel sounds Musculoskeletal:no cyanosis of digits and no clubbing  NEURO: alert & oriented x 3 with fluent speech, no focal motor/sensory deficits   Labs:  Lab Results  Component Value Date   WBC 16.5 (H) 10/29/2015   HGB 8.6 (L) 10/29/2015   HCT 28.4 (L) 10/29/2015   MCV 101.1 (H) 10/29/2015   PLT 319 10/29/2015   NEUTROABS 7.3 10/26/2015    Lab Results  Component Value Date   NA 137 10/29/2015   K 3.8 10/29/2015   CL 102 10/29/2015   CO2 28 10/29/2015    Assessment & Plan:  Poorly  differentiated carcinoma of unknown primary, either 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 Pain is better controlled today and she is asking for oral diet. She ruminates about our prior encounter 2 years ago and I apologized to the patient that I wasn't paying enough attention to her then. She is wondering why her cancer was not discovered earlier but ultimately agreed to proceed with comfort measures for now She appreciates today's visit and would like me to continue following while she is hospitalized I will return next week to check on  [facility], [place], MD 10/31/2015  10:38 AM

## 2015-10-31 NOTE — Care Management Important Message (Signed)
Important Message  Patient Details  Name: [name] [name] MRN: [id number] Date of Birth: September 08, 1945   Medicare Important Message Given:  Yes    Dominique Ressel Abena 10/31/2015, 11:09 AM

## 2015-10-31 NOTE — Progress Notes (Addendum)
Daily Progress Note   Patient Name: [name] [name]       Date: 10/31/2015 DOB: 14-Oct-1945  Age: 70 y.o. MRN#: [id number] Attending Physician: Cherene Altes, MD Primary Care Physician: [name], MD Admit Date: 10/06/2015  Reason for Consultation/Follow-up: Non pain symptom management, Pain control, Psychosocial/spiritual support and Terminal Care  Subjective: Patient still communicating with family and staff. Respirations are labored of breath but no tachypnea. Her extremities are cool and mottled. Patient is struggling to let go. She is asking her daughter for IV fluids because "I don't want to start to death". She is unable to take anything by mouth at this point. She is also struggling with sedation, telling her family she doesn't want to sleep all the time. Her daughters who are staying around the clock at her bedside recognizes that she needs the medications in order to manage shortness of breath. Discussed the role of steroids in management of shortness of breath and also setting sedation related to opioid usage. Patient still very resistant to hospice. Per daughter's just this morning mentioned "please [facility]". Chest tube remains in for symptom management  Length of Stay: 18  Current Medications: Scheduled Meds:  . dexamethasone  4 mg Intravenous Daily  . levalbuterol  0.63 mg Nebulization Q6H  . nicotine  21 mg Transdermal Daily  . sodium chloride flush  3 mL Intravenous Q12H    Continuous Infusions: . sodium chloride 10 mL/hr at 10/30/15 2300  . dextrose 5 % and 0.9% NaCl 20 mL/hr at 10/31/15 0929  . fentaNYL infusion INTRAVENOUS 150 mcg/hr (10/31/15 0924)    PRN Meds: [DISCONTINUED] acetaminophen **OR** acetaminophen, alum & mag hydroxide-simeth,  antiseptic oral rinse, fentaNYL, [DISCONTINUED] glycopyrrolate **OR** [DISCONTINUED] glycopyrrolate **OR** glycopyrrolate, [DISCONTINUED] haloperidol **OR** [DISCONTINUED] haloperidol **OR** haloperidol lactate, LORazepam, [DISCONTINUED] ondansetron **OR** ondansetron (ZOFRAN) IV, phenol, polyethylene glycol, polyvinyl alcohol, potassium chloride, sodium chloride, sodium chloride flush  Physical Exam  Constitutional: She appears well-developed and well-nourished.  HENT:  Head: Normocephalic and atraumatic.  Cardiovascular:  Tachy at 130  Pulmonary/Chest: She has wheezes.  Increased work of breathing; wheezing  Abdominal: Soft.  Neurological:  Awakens to voice; still conversant  Skin:  Cool, mottled  Psychiatric: Her behavior is normal.  Nursing note and vitals reviewed.  Vital Signs: BP 137/89 (BP Location: Right Arm)   Pulse (!) 139   Temp 97.5 F (36.4 C) (Oral)   Resp (!) 22   Ht 5\' 3"  (1.6 m)   Wt 71.5 kg (157 lb 10.1 oz)   SpO2 92%   BMI 27.92 kg/m  SpO2: SpO2: 92 % O2 Device: O2 Device: Nasal Cannula O2 Flow Rate: O2 Flow Rate (L/min): 4 L/min  Intake/output summary:  Intake/Output Summary (Last 24 hours) at 10/31/15 0949 Last data filed at 10/31/15 0600  Gross per 24 hour  Intake            384.3 ml  Output              300 ml  Net             84.3 ml   LBM: Last BM Date: 10/27/15 Baseline Weight: Weight: 71.9 kg (158 lb 9.6 oz) Most recent weight: Weight: 71.5 kg (157 lb 10.1 oz)       Palliative Assessment/Data:    Flowsheet Rows   Flowsheet Row Most Recent Value  Intake Tab  Referral Department  Hospitalist  Unit at Time of Referral  Intermediate Care Unit  Palliative Care Primary Diagnosis  Cancer  Date Notified  10/29/15  Palliative Care Type  New Palliative care  Reason for referral  Clarify Goals of Care, Non-pain Symptom, Pain, Counsel Regarding Hospice, End of Life Care Assistance  Date of Admission  09/27/2015  Date first seen by  Palliative Care  10/29/15  # of days Palliative referral response time  0 Day(s)  # of days IP prior to Palliative referral  16  Clinical Assessment  Palliative Performance Scale Score  30%  Pain Max last 24 hours  Not able to report  Pain Min Last 24 hours  Not able to report  Dyspnea Max Last 24 Hours  Not able to report  Dyspnea Min Last 24 hours  Not able to report  Nausea Max Last 24 Hours  Not able to report  Nausea Min Last 24 Hours  Not able to report  Anxiety Max Last 24 Hours  Not able to report  Psychosocial & Spiritual Assessment  Palliative Care Outcomes  Patient/Family meeting held?  Yes  Who was at the meeting?  2 dtr's, niece  Palliative Care Outcomes  Improved non-pain symptom therapy, Counseled regarding hospice, Changed to focus on comfort, Provided psychosocial or spiritual support, Provided end of life care assistance  Patient/Family wishes: Interventions discontinued/not started   Hemodialysis, BiPAP, Mechanical Ventilation, Vasopressors, NIPPV, Trach, Antibiotics, PEG, Tube feedings/TPN, Transfusion  Palliative Care follow-up planned  Yes, Facility      Patient Active Problem List   Diagnosis Date Noted  . Palliative care encounter   . Abdominal bloating   . Sinus tachycardia ([place])   . Anxiety state   . Vasculitis ([place])   . Abdominal distension   . Metastatic carcinoma ([place])   . SOB (shortness of breath)   . Pancreatitis   . LUQ pain   . Protein-calorie malnutrition ([place])   . Abdominal discomfort   . Pneumothorax   . Acute respiratory failure with hypoxia ([place])   . Pleural effusion associated with pancreatitis 10/07/2015  . Pleural effusion, right 10/03/2015  . Cavitating mass in right lower lung lobe 10/03/2015  . Wegener's granulomatosis (granulomatosis with polyangiitis) ([place]) 10/03/2015  . Acute on chronic respiratory failure with hypoxia ([place]) 09/28/2015  . Bilateral pleural effusion 09/28/2015  . UTI (lower urinary tract  infection) 09/28/2015    . Other acute pancreatitis 09/23/2015  . Peripancreatic fluid collection ([place]) 09/23/2015  . Back pain 08/08/2015  . Wegener's granulomatosis with renal involvement ([place]) 07/30/2015  . Lung mass 06/25/2015  . Tobacco user 06/25/2015  . Other mixed anxiety disorders 06/16/2015  . Osteopenia 10/07/2014  . Esophageal dysphagia 09/24/2014  . Chest tube in place 08/24/2014  . Hx of adenomatous polyp of colon 07/25/2014  . 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 Benign head tremor 05/07/2014  . 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 COPD (chronic obstructive pulmonary disease) with emphysema ([place])   . Hypokalemia   . Hyperglycemia   . Depression 01/29/2012  . Dyslipidemia 08/04/2011  . CAD in native artery 08/04/2011  . History of tobacco abuse 08/04/2011  . Grief reaction   . Asthma 05/21/2010  . Allergic rhinitis 05/17/2010  . Hereditary hemochromatosis ([place]) 05/15/2010  . Essential hypertension 05/15/2010  . GERD 05/15/2010    Palliative Care Assessment & Plan   Patient Profile: Arlyn Yoke Hicksis an 70 y.o.WF PMHx Depression, Anxiety COPD, Tobacco abuse, RLL lung cavitary mass Recurrent Enlarging Rt Hydropneumothorax S/P:right thoracentesis 7/3 removing 1.4 L Exudative fluid/.Repeat thoracentesis on 7/6 removed 1L of the same fluid. 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 and a recent admission due to acute pancreatitis with pseudocyst.  07/24/2015 she underwent limited autoimmune workup that showed screening p-ANCA positive (no c-anca, pr3 or mpo avail). Then 08/06/15 she had full panel with Dr Roney Mans per phone call PR-3 and MPO antibody results were NEGATIVE and only P-ANCA was trace posiotive. 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 Patient then committed tohigh-dose prednisone and also Rituxan once a week for 4 weeks infusion. She completed 3  weeks of this infusion he did she was supposed to get her fourth dose a little 10/01/2015 on 10/24/2015 but she missed it because of illness.  She believes ever since starting high-dose prednisone she started developing abdominal pain. A CT abdomen 09/23/2015 showed the right lower lobe mass at 2.6 cm perhaps smaller but obscured by a right pleural effusion that was small. But she had acute pancreatitis features with small pseudocyst. She blames the high-dose prednisone for her current problems. She does not think Rituxan is the causative agent for this.   She reports she started developing abdominal pain and finally was admitted7/04/2015. Since admission the right pleural effusion got worse and she had a thoracentesis 09/29/2015 that showed idiopathic exudate with acute inflammatory cells and mesothelial cells with an LDH of 400. Thoracentesis was repeated 10/02/2015 with an LDH of 336. This was complicated by small right basilar pneumothorax on chest x-ray 10/03/2015 which radiology is conservatively following. 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 Chart reviewed, patient has utilized 500 g over the past 24 hours. Recommend that we up titrate both the basal rate as well as bolus but family at this point does not want to change medication dosages because of comments that her mother is making such as "I don't want to sleep all the time". Agreed to monitor throughout the day and anticipate that we will need further dosage adjustments to this continuous infusion will also add Decadron 4 mg IV for management of shortness of breath. Steroids may offset some of the sedation related to opioids which  patient is struggling with  Secretions: Hearing some upper extremity secretions. No Robinul as needed doses given. We'll monitor for  need for scheduled dosing  Anorexia: Patient is requesting IV fluids to be added back because "I don't want to start to death". Her daughters recognized that IV fluids will not change her clinical course. But I did agree to restart D5 normal saline at 20 mL an hour  Goals of Care and Additional Recommendations:  Limitations on Scope of Treatment: Minimize Medications, Initiate Comfort Feeding, No Artificial Feeding, No Chemotherapy, No Diagnostics, No Hemodialysis, No IV Antibiotics, No Lab Draws, No Radiation, No Surgical Procedures and No Tracheostomy  Code Status:    Code Status Orders        Start     Ordered   10/29/15 1046  Do not attempt resuscitation (DNR)  Continuous    Question Answer Comment  In the event of cardiac or respiratory ARREST Do not call a "code blue"   In the event of cardiac or respiratory ARREST Do not perform Intubation, CPR, defibrillation or ACLS   In the event of cardiac or respiratory ARREST Use medication by any route, position, wound care, and other measures to relive pain and suffering. May use oxygen, suction and manual treatment of airway obstruction as needed for comfort.      10/29/15 1051    Code Status History    Date Active Date Inactive Code Status Order ID Comments User Context   10/29/2015  7:51 AM 10/29/2015 10:51 AM DNR [id number]  Heath Lark, MD Inpatient   09/28/2015  1:17 PM 10/29/2015  7:51 AM Full Code [id number]  Magdalen Spatz, NP Inpatient   09/28/2015 10:51 PM 10/07/2015  7:57 PM Full Code [id number]  Ivor Costa, MD ED   09/23/2015 11:59 AM 09/27/2015  5:04 PM Full Code [id number]  Edwin Dada, MD Inpatient   08/24/2014  8:39 PM 08/25/2014  7:08 PM Full Code [id number]  Lavina Hamman, MD ED   07/16/2011  3:04 AM 07/16/2011  4:08 PM Full Code [id number]  Orlie Dakin, MD ED   07/16/2011  2:45 AM 07/16/2011  3:04 AM Full Code [id number]  Orlie Dakin, MD ED       Prognosis:   Hours - Days  Discharge Planning:  Anticipated Hospital  Death. Please have patient remain on [facility], stepdown unit, because of unmanaged symptoms as well as chest tube  Care plan was discussed with Dr. Thereasa Solo  Thank you for allowing the Palliative Medicine Team to assist in the care of this patient.   Time In: 0900 Time Out: 0945 Total Time 45 min Prolonged Time Billed  no       Greater than 50%  of this time was spent counseling and coordinating care related to the above assessment and plan.  Dory Horn, NP  Please contact Palliative Medicine Team phone at [phone] for questions and concerns.   Addendum 1750: Pt has continued to require multiple boluses through out the day. Family still struggling with pt's wishes to not be sedated but seeing her more dyspneic. Shared my concern that if we did not titrate meds further she would be in distress, become agitated. Family agreed to slight increase of Fentanyl from 150 mcg/hr to 175 mcg/hr and bolus increase 125 q15 min PRN. Also agreed to low dose scheduled ativan 0.5 q8 atc. PMT to follow through out the weekend.

## 2015-10-31 NOTE — Progress Notes (Signed)
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   07/24/2015 she underwent limited autoimmune workup that showed screening p-ANCA positive (no c-anca, pr3 or mpo avail). 08/06/15 she had full panel with PR-3 and MPO antibody results NEGATIVE and P-ANCA trace posiotive.  Patient then committed tohigh-dose prednisone and also Rituxan once a week for 4 weeks infusion. She completed 3 weeks of this infusion but she missed the 4th because of illness.  She believes ever since starting high-dose prednisone she started developing abdominal pain. A CT abdomen 09/23/2015 showed the right lower lobe mass at 2.6 cm perhaps smaller but obscured by a right pleural effusion that was small, but she had acute pancreatitis features with small pseudocyst.   She continued having abdominal pain and was admitted7/04/2015. Since admission the right pleural effusion got worse and she had a thoracentesis 09/29/2015 that showed idiopathic exudate with acute inflammatory cells and mesothelial cells with an LDH of 400. Thoracentesis was repeated 10/02/2015 with an LDH of 336. This was complicated by small right basilar pneumothorax on chest x-ray 10/03/2015. She was discharged from [facility] on 10/07/15  Subjective: The patient has been transitioned to comfort care only.  She has been seen by TCTS, oncology, and palliative care today.  I have chosen not to visit her today as I have nothing more to add to her care as of this moment.  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 Acquanetta Chain Palliative Care: Patient and family have agreed to be FULL COMFORT CARE. We have agreed that patient will stay in [facility].  COPD  Acute on Chronic Pancreatitis w/ Pseudocyst - Concern for cystic neoplasm  -increased size and # of peripancreatic fluid collections unable to rule out cystic neoplasm -[id number] - normal CEA.  Possible lytic lesion in Rt acetabulum -not seen in previous ct scan or PET scan  Coronary Artery Disease  HTN   Anxiety/Depression  Tobacco abuse disorder  Hemochromatosis -treated with Phlebotomy (last on 06/16/2015) - followed byDr [name]  Chronic diarrhea -suspected post cholecystectomy related  Hypokalemia  Hypomagnesemia   DVT prophylaxis: comfort focused care  Code Status: NO CODE - DNR  Family Communication:  Disposition Plan:  Hospital death expected   Consultants:  Palliative Care PCCM Oncology Thoracic surgery [facility]  Procedures: 7/21 S/P right VATS: Loculated Recurrent Right Exudative Pleural Effusionc/w empyema: RIGHT Pleura, biopsy,2/2 biopsy positiveMETASTATIC POORLY DIFFERENTIATED CARCINOMA  Antimicrobials:  None  Objective: Blood pressure 137/89, pulse (!) 139, temperature 97.5 F (36.4 C), temperature source Oral, resp. rate (!) 22, height 5\' 3"  (1.6 m), weight 71.5 kg (157 lb 10.1 oz), SpO2 95 %.  Intake/Output Summary (Last 24 hours) at 10/31/15 1707 Last data filed at 10/31/15 1516  Gross per 24 hour  Intake  369.3 ml  Output              700 ml  Net           -330.7 ml   Filed Weights    10/27/15 0500 10/27/15 1803 10/28/15 0500  Weight: 71.7 kg (158 lb) 71.2 kg (157 lb) 71.5 kg (157 lb 10.1 oz)    Examination:   CBC:  Recent Labs Lab 10/25/15 1320 10/25/15 1625 10/26/15 0348 10/29/15 0422  WBC 10.6*  --  11.4* 16.5*  NEUTROABS  --   --  7.3  --   HGB 8.6*  --  8.8* 8.6*  HCT 28.2* 27.5* 29.3* 28.4*  MCV 100.7*  --  102.4* 101.1*  PLT 241  --  255 [id number]   Basic Metabolic Panel:  Recent Labs Lab 10/25/15 1320 10/25/15 1543 10/25/15 1958 10/26/15 0348 10/27/15 0505 10/28/15 0500 10/29/15 0422  NA 138  --   --  137 138 138 137  K 2.9*  --  3.4* 3.4* 3.4* 3.3* 3.8  CL 104  --   --  102 104 104 102  CO2 27  --   --  30 27 28 28   GLUCOSE 124*  --   --  107* 149* 160* 106*  BUN 11  --   --  13 14 10 8   CREATININE 0.36*  --   --  0.38* 0.36* 0.31* <0.30*  CALCIUM 8.0*  --   --  8.4* 8.1* 7.8* 8.3*  MG  --  1.9  --   --  1.9 1.7 2.0   Liver Function Tests:  Recent Labs Lab 10/25/15 1320  AST 14*  ALT 15  ALKPHOS 78  BILITOT 0.5  PROT 4.2*  ALBUMIN 1.8*   HbA1C: Hgb A1c MFr Bld  Date/Time Value Ref Range Status  10/21/2015 09:47 AM 5.9 4.6 - 6.5 % Final    Comment:    Glycemic Control Guidelines for People with Diabetes:Non Diabetic:  <6%Goal of Therapy: <7%Additional Action Suggested:  >8%   09/24/2015 03:54 PM 6.5 (H) 4.8 - 5.6 % Final    Comment:    (NOTE)         Pre-diabetes: 5.7 - 6.4         Diabetes: >6.4         Glycemic control for adults with diabetes: <7.0     CBG:  Recent Labs Lab 10/28/15 1617 10/28/15 1927 10/28/15 2306 10/29/15 0437 10/29/15 0843  GLUCAP 121* 130* 113* 111* 133*    No results found for this or any previous visit (from the past 240 hour(s)).   Scheduled Meds: . dexamethasone  4 mg Intravenous Daily  . levalbuterol  0.63 mg Nebulization Q6H  . nicotine  21 mg Transdermal Daily  . sodium chloride flush  3 mL Intravenous Q12H   Continuous Infusions: . sodium chloride 10 mL/hr at 10/30/15 2300    . dextrose 5 % and 0.9% NaCl 20 mL/hr at 10/31/15 0929  . fentaNYL infusion INTRAVENOUS 150 mcg/hr (10/31/15 0924)     LOS: 18 days    Cherene Altes, MD [facility] Office  [phone] Pager - Text Page per Amion as per below:  On-Call/Text Page:      [email or web address]      password [email or web address]  If 7PM-7AM, please contact night-coverage [email or web address] Password [email or web address] 10/31/2015, 5:07 PM

## 2015-11-01 MED ORDER — LEVALBUTEROL HCL 1.25 MG/0.5ML IN NEBU: 1.2500 mg | INHALATION_SOLUTION | Freq: Four times a day (QID) | RESPIRATORY_TRACT | Status: DC | PRN

## 2015-11-01 MED ORDER — FENTANYL BOLUS VIA INFUSION
150.0000 ug | INTRAVENOUS | Status: DC | PRN
  Administered 2015-11-01 – 2015-11-02 (×6): 150 ug via INTRAVENOUS
  Filled 2015-11-01: qty 150

## 2015-11-01 MED ORDER — ALBUTEROL SULFATE (2.5 MG/3ML) 0.083% IN NEBU
INHALATION_SOLUTION | RESPIRATORY_TRACT | Status: AC
  Filled 2015-11-01: qty 3

## 2015-11-01 MED ORDER — ALBUTEROL SULFATE (2.5 MG/3ML) 0.083% IN NEBU
2.5000 mg | INHALATION_SOLUTION | Freq: Four times a day (QID) | RESPIRATORY_TRACT | Status: DC | PRN
  Administered 2015-11-01: 2.5 mg via RESPIRATORY_TRACT

## 2015-11-01 MED ORDER — LORAZEPAM 2 MG/ML IJ SOLN
1.0000 mg | INTRAMUSCULAR | Status: DC | PRN
  Administered 2015-11-02: 2 mg via INTRAVENOUS
  Filled 2015-11-01 (×2): qty 1

## 2015-11-01 MED ORDER — LORAZEPAM 2 MG/ML IJ SOLN
1.0000 mg | Freq: Four times a day (QID) | INTRAMUSCULAR | Status: DC
  Administered 2015-11-01 – 2015-11-02 (×4): 1 mg via INTRAVENOUS
  Filled 2015-11-01 (×4): qty 1

## 2015-11-01 NOTE — Progress Notes (Signed)
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   07/24/2015 she underwent limited autoimmune workup that showed screening p-ANCA positive (no c-anca, pr3 or mpo avail). 08/06/15 she had full panel with PR-3 and MPO antibody results NEGATIVE and P-ANCA trace posiotive.  Patient then committed tohigh-dose prednisone and also Rituxan once a week for 4 weeks infusion. She completed 3 weeks of this infusion but she missed the 4th because of illness.  She believes ever since starting high-dose prednisone she started developing abdominal pain. A CT abdomen 09/23/2015 showed the right lower lobe mass at 2.6 cm perhaps smaller but obscured by a right pleural effusion that was small, but she had acute pancreatitis features with small pseudocyst.   She continued having abdominal pain and was admitted7/04/2015. Since admission the right pleural effusion got worse and she had a thoracentesis 09/29/2015 that showed idiopathic exudate with acute inflammatory cells and mesothelial cells with an LDH of 400. Thoracentesis was repeated 10/02/2015 with an LDH of 336. This was complicated by small right basilar pneumothorax on chest x-ray 10/03/2015. She was discharged from [facility] on 10/07/15  Subjective: The patient has been transitioned to comfort care only.  I have visited family in the room and observed the patient.  She is currently resting currently without any evidence of respiratory distress or uncontrolled pain.  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 Acquanetta Chain Palliative Care: Patient and family have agreed to be FULL COMFORT CARE. We have agreed that patient will stay in [place].  COPD  Acute on Chronic Pancreatitis w/ Pseudocyst - Concern for cystic neoplasm  -increased size and # of peripancreatic fluid collections unable to rule out cystic neoplasm -[id number] - normal CEA  Possible lytic lesion in Rt acetabulum -not seen in previous ct scan or PET scan  Coronary Artery Disease  HTN   Anxiety/Depression  Tobacco abuse disorder  Hemochromatosis -treated with Phlebotomy (last on 06/16/2015) - followed byDr [name]  Chronic diarrhea -suspected post cholecystectomy related  Hypokalemia  Hypomagnesemia   DVT prophylaxis: comfort focused care  Code Status: NO CODE - DNR  Family Communication:  Disposition Plan:  Hospital death expected   Consultants:  Palliative Care PCCM Oncology Thoracic surgery [facility]  Procedures: 7/21 S/P right VATS: Loculated Recurrent Right Exudative Pleural Effusionc/w empyema: RIGHT Pleura, biopsy,2/2 biopsy positiveMETASTATIC POORLY DIFFERENTIATED CARCINOMA  Antimicrobials:  None  Objective: Blood pressure (!) 143/91, pulse (!) 132, temperature 97.5 F (36.4 C), temperature source Oral, resp. rate 16, height 5\' 3"  (1.6 m), weight 71.5 kg (157 lb 10.1 oz), SpO2 96 %.  Intake/Output Summary (Last 24 hours) at 11/01/15 1131 Last data filed at 11/01/15 0840  Gross per 24 hour  Intake           795.46 ml  Output  1035 ml  Net          -239.54 ml   Filed Weights    10/27/15 0500 10/27/15 1803 10/28/15 0500  Weight: 71.7 kg (158 lb) 71.2 kg (157 lb) 71.5 kg (157 lb 10.1 oz)    Examination: At present the patient appears to be resting comfortably.  She is not currently tachypneic/her respirations appear comfortable.  She is tachycardic but it does not appear to be leading to any distress or discomfort.  CBC:  Recent Labs Lab 10/25/15 1320 10/25/15 1625 10/26/15 0348 10/29/15 0422  WBC 10.6*  --  11.4* 16.5*  NEUTROABS  --   --  7.3  --   HGB 8.6*  --  8.8* 8.6*  HCT 28.2* 27.5* 29.3* 28.4*  MCV 100.7*  --  102.4* 101.1*  PLT 241  --  255 [id number]   Basic Metabolic Panel:  Recent Labs Lab 10/25/15 1320 10/25/15 1543 10/25/15 1958 10/26/15 0348 10/27/15 0505 10/28/15 0500 10/29/15 0422  NA 138  --   --  137 138 138 137  K 2.9*  --  3.4* 3.4* 3.4* 3.3* 3.8  CL 104  --   --  102 104 104 102  CO2 27  --   --  30 27 28 28   GLUCOSE 124*  --   --  107* 149* 160* 106*  BUN 11  --   --  13 14 10 8   CREATININE 0.36*  --   --  0.38* 0.36* 0.31* <0.30*  CALCIUM 8.0*  --   --  8.4* 8.1* 7.8* 8.3*  MG  --  1.9  --   --  1.9 1.7 2.0   Liver Function Tests:  Recent Labs Lab 10/25/15 1320  AST 14*  ALT 15  ALKPHOS 78  BILITOT 0.5  PROT 4.2*  ALBUMIN 1.8*   HbA1C: Hgb A1c MFr Bld  Date/Time Value Ref Range Status  10/04/2015 09:47 AM 5.9 4.6 - 6.5 % Final    Comment:    Glycemic Control Guidelines for People with Diabetes:Non Diabetic:  <6%Goal of Therapy: <7%Additional Action Suggested:  >8%   09/24/2015 03:54 PM 6.5 (H) 4.8 - 5.6 % Final    Comment:    (NOTE)         Pre-diabetes: 5.7 - 6.4         Diabetes: >6.4         Glycemic control for adults with diabetes: <7.0     CBG:  Recent Labs Lab 10/28/15 1617 10/28/15 1927 10/28/15 2306 10/29/15 0437 10/29/15 0843  GLUCAP 121* 130* 113* 111* 133*    No results found for this or any previous visit (from the past 240 hour(s)).   Scheduled Meds: . albuterol      .  dexamethasone  4 mg Intravenous Daily  . levalbuterol  0.63 mg Nebulization Q6H  . LORazepam  1 mg Intravenous Q6H  . nicotine  21 mg Transdermal Daily  . sodium chloride flush  3 mL Intravenous Q12H   Continuous Infusions: . sodium chloride 10 mL/hr at 10/30/15 2300  . dextrose 5 % and 0.9% NaCl 20 mL/hr at 10/31/15 0929  . fentaNYL infusion INTRAVENOUS 200 mcg/hr (11/01/15 0913)     LOS: 19 days    Cherene Altes, MD [facility] Office  [phone] Pager - Text Page per Amion as per below:  On-Call/Text Page:      [email or web address]      password [email or web address]  If 7PM-7AM, please contact night-coverage  [email or web address] Password [email or web address] 11/01/2015, 11:31 AM

## 2015-11-01 NOTE — Progress Notes (Addendum)
      [address]       ,[place] [id number]             [phone]       15 Days Post-Op Procedure(s) (LRB): VIDEO ASSISTED THORACOSCOPY (VATS) with Drainage of Pleural Effusion (Right) DRAINAGE OF PLEURAL EFFUSION (Right)  Subjective: Patient states pain is better controlled.  Objective: Vital signs in last 24 hours: Temp:  [97.5 F (36.4 C)-98.2 F (36.8 C)] 97.5 F (36.4 C) (08/05 0750) Pulse Rate:  [128-132] 132 (08/05 0750) Cardiac Rhythm: Sinus tachycardia (08/05 0750) Resp:  [16-20] 16 (08/05 0750) BP: (134-143)/(91-92) 143/91 (08/05 0750) SpO2:  [89 %-96 %] 96 % (08/05 0750)      Intake/Output from previous day: 08/04 0701 - 08/05 0700 In: 748.8 [I.V.:748.8] Out: 950 [Urine:750; Chest Tube:200]   Physical Exam:  Cardiovascular: RRR Pulmonary: Clear to auscultation on left and coarse on right. Chest Tube: to water seal, air leak  Lab Results: CBC:No results for input(s): WBC, HGB, HCT, PLT in the last 72 hours. BMET: No results for input(s): NA, K, CL, CO2, GLUCOSE, BUN, CREATININE, CALCIUM in the last 72 hours.  PT/INR: No results for input(s): LABPROT, INR in the last 72 hours. ABG:  INR: Will add last result for INR, ABG once components are confirmed Will add last 4 CBG results once components are confirmed  Assessment/Plan:  1. CV - HR in the 130's. 2.  Pulmonary - Chest tube output with 110 cc last 24 hours. Chest tube is to water seal. There is an air leak with cough. Chest tube to remain for now. 3. Management per palliative care  ZIMMERMAN,DONIELLE MPA-C 11/01/2015,9:53 AM  I have seen and examined the patient and agree with the assessment and plan as outlined.  Rexene Alberts, MD 11/01/2015 1:04 PM

## 2015-11-01 NOTE — Progress Notes (Signed)
Wasted 15cc of fentanyl gtt in sink. Witness Janellie Tennison RN and  Leonie Man RN.

## 2015-11-01 NOTE — Progress Notes (Signed)
Daily Progress Note   Patient Name: [name] [name]       Date: 11/01/2015 DOB: 08/28/1945  Age: 70 y.o. MRN#: [id number] Attending Physician: Cherene Altes, MD Primary Care Physician: [name], MD Admit Date: 09/30/2015  Reason for Consultation/Follow-up: Non pain symptom management, Pain control, Psychosocial/spiritual support and Terminal Care  Subjective: Pt still mostly alert. Very anxious. Struggling against death. Told her daughters she thinks it will be today. Fewer boluses noted since rate increase.   Length of Stay: 19  Current Medications: Scheduled Meds:  . albuterol      . dexamethasone  4 mg Intravenous Daily  . levalbuterol  0.63 mg Nebulization Q6H  . LORazepam  1 mg Intravenous Q6H  . nicotine  21 mg Transdermal Daily  . sodium chloride flush  3 mL Intravenous Q12H    Continuous Infusions: . sodium chloride 10 mL/hr at 10/30/15 2300  . dextrose 5 % and 0.9% NaCl 20 mL/hr at 10/31/15 0929  . fentaNYL infusion INTRAVENOUS 175 mcg/hr (11/01/15 0600)    PRN Meds: [DISCONTINUED] acetaminophen **OR** acetaminophen, alum & mag hydroxide-simeth, antiseptic oral rinse, bisacodyl, fentaNYL, [DISCONTINUED] glycopyrrolate **OR** [DISCONTINUED] glycopyrrolate **OR** glycopyrrolate, [DISCONTINUED] haloperidol **OR** [DISCONTINUED] haloperidol **OR** haloperidol lactate, levalbuterol, LORazepam, [DISCONTINUED] ondansetron **OR** ondansetron (ZOFRAN) IV, phenol, polyethylene glycol, polyvinyl alcohol, potassium chloride, sodium chloride, sodium chloride flush  Physical Exam  Constitutional: She appears well-developed and well-nourished.  Acutely ill female  HENT:  Head: Normocephalic and atraumatic.  Cardiovascular:  tachy  Pulmonary/Chest:  Increased work of  breathing  Abdominal: She exhibits distension.  Neurological:  somnolent but answering simple questions  Skin:  Cool, mottled  Psychiatric:  anxious  Nursing note and vitals reviewed.           Vital Signs: BP (!) 143/91 (BP Location: Right Arm)   Pulse (!) 132   Temp 97.5 F (36.4 C) (Oral)   Resp 16   Ht 5\' 3"  (1.6 m)   Wt 71.5 kg (157 lb 10.1 oz)   SpO2 96%   BMI 27.92 kg/m  SpO2: SpO2: 96 % O2 Device: O2 Device: Nasal Cannula O2 Flow Rate: O2 Flow Rate (L/min): 6 L/min  Intake/output summary:  Intake/Output Summary (Last 24 hours) at 11/01/15 0915 Last data filed at 11/01/15 0840  Gross per 24 hour  Intake  795.46 ml  Output             1060 ml  Net          -264.54 ml   LBM: Last BM Date: 10/27/15 Baseline Weight: Weight: 71.9 kg (158 lb 9.6 oz) Most recent weight: Weight: 71.5 kg (157 lb 10.1 oz)       Palliative Assessment/Data:    Flowsheet Rows   Flowsheet Row Most Recent Value  Intake Tab  Referral Department  Hospitalist  Unit at Time of Referral  Intermediate Care Unit  Palliative Care Primary Diagnosis  Cancer  Date Notified  10/29/15  Palliative Care Type  New Palliative care  Reason for referral  Clarify Goals of Care, Non-pain Symptom, Pain, Counsel Regarding Hospice, End of Life Care Assistance  Date of Admission  10/27/2015  Date first seen by Palliative Care  10/29/15  # of days Palliative referral response time  0 Day(s)  # of days IP prior to Palliative referral  16  Clinical Assessment  Palliative Performance Scale Score  30%  Pain Max last 24 hours  Not able to report  Pain Min Last 24 hours  Not able to report  Dyspnea Max Last 24 Hours  Not able to report  Dyspnea Min Last 24 hours  Not able to report  Nausea Max Last 24 Hours  Not able to report  Nausea Min Last 24 Hours  Not able to report  Anxiety Max Last 24 Hours  Not able to report  Psychosocial & Spiritual Assessment  Palliative Care Outcomes  Patient/Family  meeting held?  Yes  Who was at the meeting?  2 dtr's, niece  Palliative Care Outcomes  Improved non-pain symptom therapy, Counseled regarding hospice, Changed to focus on comfort, Provided psychosocial or spiritual support, Provided end of life care assistance  Patient/Family wishes: Interventions discontinued/not started   Hemodialysis, BiPAP, Mechanical Ventilation, Vasopressors, NIPPV, Trach, Antibiotics, PEG, Tube feedings/TPN, Transfusion  Palliative Care follow-up planned  Yes, Facility      Patient Active Problem List   Diagnosis Date Noted  . Palliative care encounter   . Abdominal bloating   . Sinus tachycardia ([place])   . Anxiety state   . Vasculitis ([place])   . Abdominal distension   . Metastatic carcinoma ([place])   . SOB (shortness of breath)   . Pancreatitis   . LUQ pain   . Protein-calorie malnutrition ([place])   . Abdominal discomfort   . Pneumothorax   . Acute respiratory failure with hypoxia ([place])   . Pleural effusion associated with pancreatitis 10/24/2015  . Pleural effusion, right 10/03/2015  . Cavitating mass in right lower lung lobe 10/03/2015  . Wegener's granulomatosis (granulomatosis with polyangiitis) ([place]) 10/03/2015  . Acute on chronic respiratory failure with hypoxia ([place]) 09/28/2015  . Bilateral pleural effusion 09/28/2015  . UTI (lower urinary tract infection) 09/28/2015  . Other acute pancreatitis 09/23/2015  . Peripancreatic fluid collection ([place]) 09/23/2015  . Back pain 08/08/2015  . Wegener's granulomatosis with renal involvement ([place]) 07/30/2015  . Lung mass 06/25/2015  . Tobacco user 06/25/2015  . Other mixed anxiety disorders 06/16/2015  . Osteopenia 10/07/2014  . Esophageal dysphagia 09/24/2014  . Chest tube in place 08/24/2014  . Hx of adenomatous polyp of colon 07/25/2014  . 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 Benign head tremor 05/07/2014  . 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 COPD  (chronic obstructive pulmonary disease) with emphysema ([place])   . Hypokalemia   .  Hyperglycemia   . Depression 01/29/2012  . Dyslipidemia 08/04/2011  . CAD in native artery 08/04/2011  . History of tobacco abuse 08/04/2011  . Grief reaction   . Asthma 05/21/2010  . Allergic rhinitis 05/17/2010  . Hereditary hemochromatosis ([place]) 05/15/2010  . Essential hypertension 05/15/2010  . 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   07/24/2015 she underwent limited autoimmune workup that showed screening p-ANCA positive (no c-anca, pr3 or mpo avail). 08/06/15 she had full panel with PR-3 and MPO antibody results NEGATIVE and P-ANCA trace posiotive.  Patient then committed tohigh-dose prednisone and also Rituxan once a week for 4 weeks infusion. She completed 3 weeks of this infusion but she missed the 4th because of illness.  She believes ever since starting high-dose prednisone she started developing abdominal pain. A CT abdomen 09/23/2015 showed the right lower lobe mass at 2.6 cm perhaps smaller but obscured by a right pleural effusion that was small, but she had acute pancreatitis features with small pseudocyst.   She continued having abdominal pain and was admitted7/04/2015. Since admission the right pleural effusion got worse and she had a thoracentesis 09/29/2015 that showed idiopathic exudate with acute inflammatory cells and mesothelial cells with an LDH of 400. Thoracentesis was repeated 10/02/2015 with an LDH of 336. This was complicated by small right basilar pneumothorax on chest  x-ray 10/03/2015. She was discharged from [facility] on 10/07/15   Recommendations/Plan:  Dyspnea: Up titrate fentanyl gtt 200 mcg/hr and 150 mcg q15 min prn. Cont decadron 4mg  daily; 02 via [place]. Will DC albuterol because of c/o heart palpitation. Xopenex q6 prn. Leave in chest tube  Pain: See above  Goals of Care and Additional Recommendations:  Limitations on Scope of Treatment: Minimize Medications, Initiate Comfort Feeding, No Artificial Feeding, No Blood Transfusions, No Chemotherapy, No Diagnostics, No Hemodialysis, No Radiation, No Surgical Procedures and No Tracheostomy  Code Status:    Code Status Orders        Start     Ordered   10/29/15 1046  Do not attempt resuscitation (DNR)  Continuous    Question Answer Comment  In the event of cardiac or respiratory ARREST Do not call a "code blue"   In the event of cardiac or respiratory ARREST Do not perform Intubation, CPR, defibrillation or ACLS   In the event of cardiac or respiratory ARREST Use medication by any route, position, wound care, and other measures to relive pain and suffering. May use oxygen, suction and manual treatment of airway obstruction as needed for comfort.      10/29/15 1051    Code Status History    Date Active Date Inactive Code Status Order ID Comments User Context   10/29/2015  7:51 AM 10/29/2015 10:51 AM DNR [id number]  Heath Lark, MD Inpatient   10/20/2015  1:17 PM 10/29/2015  7:51 AM Full Code [id number]  Magdalen Spatz, NP Inpatient   09/28/2015 10:51 PM 10/07/2015  7:57 PM Full Code [id number]  Ivor Costa, MD ED   09/23/2015 11:59 AM 09/27/2015  5:04 PM Full Code [id number]  Edwin Dada, MD Inpatient   08/24/2014  8:39 PM 08/25/2014  7:08 PM Full Code [id number]  Lavina Hamman, MD ED   07/16/2011  3:04 AM 07/16/2011  4:08 PM Full Code [id number]  Orlie Dakin, MD ED   07/16/2011  2:45 AM 07/16/2011  3:04 AM Full Code [id number]  Orlie Dakin, MD ED       Prognosis:   Hours - Days  Discharge  Planning:  Anticipated Hospital Death. Have discussed transfer to in-pt hospice again. Pt continues to refuse. Believes hospice will   Care plan was discussed with Dr. Hilma Favors and Dr. Thereasa Solo  Thank you for allowing the Palliative Medicine Team to assist in the care of this patient.   Time In: 0900 Time Out: 0945 Total Time 45 min Prolonged Time Billed  no       Greater than 50%  of this time was spent counseling and coordinating care related to the above assessment and plan.  Dory Horn, NP  Please contact Palliative Medicine Team phone at [phone] for questions and concerns.

## 2015-11-02 MED ORDER — FENTANYL BOLUS VIA INFUSION
175.0000 ug | INTRAVENOUS | Status: DC | PRN
  Administered 2015-11-02 – 2015-11-03 (×4): 175 ug via INTRAVENOUS
  Filled 2015-11-02: qty 175

## 2015-11-02 MED ORDER — LORAZEPAM 2 MG/ML IJ SOLN
1.0000 mg | INTRAMUSCULAR | Status: DC
  Administered 2015-11-02 – 2015-11-03 (×6): 1 mg via INTRAVENOUS
  Filled 2015-11-02 (×7): qty 1

## 2015-11-02 NOTE — Progress Notes (Signed)
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   07/24/2015 she underwent limited autoimmune workup that showed screening p-ANCA positive (no c-anca, pr3 or mpo avail). 08/06/15 she had full panel with PR-3 and MPO antibody results NEGATIVE and P-ANCA trace posiotive.  Patient then committed tohigh-dose prednisone and also Rituxan once a week for 4 weeks infusion. She completed 3 weeks of this infusion but she missed the 4th because of illness.  She believes ever since starting high-dose prednisone she started developing abdominal pain. A CT abdomen 09/23/2015 showed the right lower lobe mass at 2.6 cm perhaps smaller but obscured by a right pleural effusion that was small, but she had acute pancreatitis features with small pseudocyst.   She continued having abdominal pain and was admitted7/04/2015. Since admission the right pleural effusion got worse and she had a thoracentesis 09/29/2015 that showed idiopathic exudate with acute inflammatory cells and mesothelial cells with an LDH of 400. Thoracentesis was repeated 10/02/2015 with an LDH of 336. This was complicated by small right basilar pneumothorax on chest x-ray 10/03/2015. She was discharged from [facility] on 10/07/15  Subjective: I have visited family in the room and observed the patient.  She is currently resting currently without any evidence of respiratory distress or uncontrolled pain.  Her pain regimen was adjusted this morning by the Palliative Care team.     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 Acquanetta Chain Palliative Care: Patient and family have agreed to be FULL COMFORT CARE. We have agreed that patient will stay in [place].  COPD  Acute on Chronic Pancreatitis w/ Pseudocyst - Concern for cystic neoplasm  -increased size and # of peripancreatic fluid collections unable to rule out cystic neoplasm -[id number] - normal CEA  Possible lytic lesion in Rt acetabulum -not seen in previous ct scan or PET scan  Coronary Artery Disease  HTN   Anxiety/Depression  Tobacco abuse disorder  Hemochromatosis -treated with Phlebotomy (last on 06/16/2015) - followed byDr [name]  Chronic diarrhea -suspected post cholecystectomy related  Hypokalemia  Hypomagnesemia   DVT prophylaxis: comfort focused care  Code Status: NO CODE - DNR  Family Communication:  Disposition Plan:  Hospital death expected   Consultants:  Palliative Care PCCM Oncology Thoracic surgery [facility]  Procedures: 7/21 S/P right VATS: Loculated Recurrent Right Exudative Pleural Effusionc/w empyema: RIGHT Pleura, biopsy,2/2 biopsy positiveMETASTATIC POORLY DIFFERENTIATED CARCINOMA  Antimicrobials:  None  Objective: Blood pressure 137/73, pulse (!) 129, temperature 98.6 F (37 C), temperature source Oral, resp. rate 17, height 5\' 3"  (1.6 m), weight 71.5 kg (157 lb 10.1 oz), SpO2 95 %.  Intake/Output Summary (Last 24 hours) at 11/02/15 1528 Last data filed at 11/02/15 1500  Gross per 24 hour  Intake          1827.87 ml  Output              355 ml  Net          1472.87 ml   Filed Weights    10/27/15 0500 10/27/15 1803 10/28/15 0500  Weight: 71.7 kg (158 lb) 71.2 kg (157 lb) 71.5 kg (157 lb 10.1 oz)    Examination: The patient appears to be resting comfortably.  She is not currently tachypneic/her respirations appear comfortable.  She is tachycardic but it does not appear to be leading to any distress or discomfort.  CBC:  Recent Labs Lab 10/29/15 0422  WBC 16.5*  HGB 8.6*  HCT 28.4*  MCV 101.1*  PLT 99991111   Basic Metabolic Panel:  Recent Labs Lab 10/27/15 0505 10/28/15 0500 10/29/15 0422  NA 138 138 137  K 3.4* 3.3* 3.8  CL 104 104 102  CO2 27 28 28   GLUCOSE 149* 160* 106*  BUN 14 10 8   CREATININE 0.36* 0.31* <0.30*  CALCIUM 8.1* 7.8* 8.3*  MG 1.9 1.7 2.0   Liver Function Tests: No results for input(s): AST, ALT, ALKPHOS, BILITOT, PROT, ALBUMIN in the last 168 hours. HbA1C: Hgb A1c MFr Bld  Date/Time Value Ref Range Status  10/08/2015 09:47 AM 5.9 4.6 - 6.5 % Final    Comment:    Glycemic Control Guidelines for People with Diabetes:Non Diabetic:  <6%Goal of Therapy: <7%Additional Action Suggested:  >8%   09/24/2015 03:54 PM 6.5 (H) 4.8 - 5.6 % Final    Comment:    (NOTE)         Pre-diabetes: 5.7 - 6.4         Diabetes: >6.4         Glycemic control for adults with diabetes: <7.0     CBG:  Recent Labs Lab 10/28/15 1617 10/28/15 1927 10/28/15 2306 10/29/15 0437 10/29/15 0843  GLUCAP 121* 130* 113* 111* 133*    No results found for this or any previous visit (from the past 240 hour(s)).   Scheduled Meds: . dexamethasone  4 mg Intravenous Daily  . levalbuterol  0.63 mg Nebulization Q6H  . LORazepam  1 mg Intravenous Q4H  . nicotine  21 mg Transdermal Daily  . sodium chloride flush  3 mL Intravenous Q12H   Continuous Infusions: . sodium chloride 10 mL/hr at 11/02/15 0600  . dextrose 5 % and 0.9% NaCl 20 mL/hr at 11/02/15 1219  . fentaNYL infusion INTRAVENOUS 250 mcg/hr (11/02/15 1217)     LOS: 20 days    Cherene Altes,  MD [facility] Office  [phone] Pager - Text Page per Amion as per below:  On-Call/Text Page:      [email or web address]      password [email or web address]  If 7PM-7AM, please contact night-coverage [email or web address] Password [email or web address] 11/02/2015, 3:28 PM

## 2015-11-02 NOTE — Progress Notes (Addendum)
      [address]       [place], [address]             [phone]       16 Days Post-Op Procedure(s) (LRB): VIDEO ASSISTED THORACOSCOPY (VATS) with Drainage of Pleural Effusion (Right) DRAINAGE OF PLEURAL EFFUSION (Right)  Subjective: Patient less awake and alert this am.  Objective: Vital signs in last 24 hours: Temp:  [98.6 F (37 C)] 98.6 F (37 C) (08/06 0717) Pulse Rate:  [130-132] 130 (08/06 0717) Cardiac Rhythm: Sinus tachycardia (08/06 0717) Resp:  [9-24] 9 (08/06 0717) BP: (137-153)/(73-103) 137/73 (08/06 0717) SpO2:  [87 %-96 %] 87 % (08/06 0717)      Intake/Output from previous day: 08/05 0701 - 08/06 0700 In: [place] [P.O.:30; I.V.:1232] Out: 425 [Urine:225; Chest Tube:200]   Physical Exam:  Pulmonary: Clear to auscultation on left and coarse on right. Chest Tube: to water seal, tidling  Lab Results: CBC:No results for input(s): WBC, HGB, HCT, PLT in the last 72 hours. BMET: No results for input(s): NA, K, CL, CO2, GLUCOSE, BUN, CREATININE, CALCIUM in the last 72 hours.  PT/INR: No results for input(s): LABPROT, INR in the last 72 hours. ABG:  INR: Will add last result for INR, ABG once components are confirmed Will add last 4 CBG results once components are confirmed  Assessment/Plan:  1. CV - HR in the 130's. 2.  Pulmonary - Chest tube output with 200 cc last 24 hours (40 cc last 12). Chest tube is to water seal. There is tidling. Chest tube to remain for now. 3. Management per palliative care  ZIMMERMAN,DONIELLE MPA-C 11/02/2015,9:26 AM    I have seen and examined the patient and agree with the assessment and plan as outlined.  Rexene Alberts, MD 11/02/2015 11:49 AM

## 2015-11-02 NOTE — Progress Notes (Signed)
Daily Progress Note   Patient Name: [name] [name]       Date: 11/02/2015 DOB: Jul 28, 1945  Age: 70 y.o. MRN#: [id number] Attending Physician: Cherene Altes, MD Primary Care Physician: [name], MD Admit Date: 09/28/2015  Reason for Consultation/Follow-up: Hospice Evaluation, Non pain symptom management, Pain control, Psychosocial/spiritual support and Terminal Care  Subjective: Patient appears to be transitioning towards end-of-life. She is exhibiting more anxiety, agitation when awake. Question if she is developing terminal agitation. This morning her breathing is very labored, she is saying "help me help me", moaning with exhalations. She has used several boluses in the night as well as as needed Ativan total 24 hour dosage and as needed boluses of fentanyl are 750 g. Daughters have remained at the bedside. They did give me permission to speak with her about transitioning to inpatient hospice which I attempted to do. Patient became very agitated and upset and stated "leave me where I am"  Length of Stay: 20  Current Medications: Scheduled Meds:  . dexamethasone  4 mg Intravenous Daily  . levalbuterol  0.63 mg Nebulization Q6H  . LORazepam  1 mg Intravenous Q4H  . nicotine  21 mg Transdermal Daily  . sodium chloride flush  3 mL Intravenous Q12H    Continuous Infusions: . sodium chloride 10 mL/hr at 11/02/15 0600  . dextrose 5 % and 0.9% NaCl 20 mL/hr at 11/02/15 1219  . fentaNYL infusion INTRAVENOUS 250 mcg/hr (11/02/15 1217)    PRN Meds: [DISCONTINUED] acetaminophen **OR** acetaminophen, alum & mag hydroxide-simeth, antiseptic oral rinse, bisacodyl, fentaNYL, [DISCONTINUED] glycopyrrolate **OR** [DISCONTINUED] glycopyrrolate **OR** glycopyrrolate, [DISCONTINUED] haloperidol  **OR** [DISCONTINUED] haloperidol **OR** haloperidol lactate, levalbuterol, LORazepam, [DISCONTINUED] ondansetron **OR** ondansetron (ZOFRAN) IV, phenol, polyethylene glycol, polyvinyl alcohol, potassium chloride, sodium chloride, sodium chloride flush  Physical Exam  Constitutional: She appears well-developed and well-nourished.  Acutely ill, appears in distress  HENT:  Head: Normocephalic and atraumatic.  Neck: Normal range of motion.  Cardiovascular:  tachy  Pulmonary/Chest:  Increased work of breathing  Neurological:  Lethargic but still answering some simple questions  Skin:  Cool, mottled  Psychiatric:  ? If developing terminal delirum  Nursing note and vitals reviewed.           Vital Signs: BP 137/73 (BP Location: Right Arm)   Pulse (!) 129  Temp 98.6 F (37 C) (Oral)   Resp 17   Ht 5\' 3"  (1.6 m)   Wt 71.5 kg (157 lb 10.1 oz)   SpO2 95%   BMI 27.92 kg/m  SpO2: SpO2: 95 % O2 Device: O2 Device: Nasal Cannula O2 Flow Rate: O2 Flow Rate (L/min): 6 L/min  Intake/output summary:  Intake/Output Summary (Last 24 hours) at 11/02/15 1812 Last data filed at 11/02/15 1500  Gross per 24 hour  Intake          1496.58 ml  Output              155 ml  Net          1341.58 ml   LBM: Last BM Date: 10/27/15 Baseline Weight: Weight: 71.9 kg (158 lb 9.6 oz) Most recent weight: Weight: 71.5 kg (157 lb 10.1 oz)       Palliative Assessment/Data:    Flowsheet Rows   Flowsheet Row Most Recent Value  Intake Tab  Referral Department  Hospitalist  Unit at Time of Referral  Intermediate Care Unit  Palliative Care Primary Diagnosis  Cancer  Date Notified  10/29/15  Palliative Care Type  New Palliative care  Reason for referral  Clarify Goals of Care, Non-pain Symptom, Pain, Counsel Regarding Hospice, End of Life Care Assistance  Date of Admission  10/26/2015  Date first seen by Palliative Care  10/29/15  # of days Palliative referral response time  0 Day(s)  # of days IP prior  to Palliative referral  16  Clinical Assessment  Palliative Performance Scale Score  30%  Pain Max last 24 hours  Not able to report  Pain Min Last 24 hours  Not able to report  Dyspnea Max Last 24 Hours  Not able to report  Dyspnea Min Last 24 hours  Not able to report  Nausea Max Last 24 Hours  Not able to report  Nausea Min Last 24 Hours  Not able to report  Anxiety Max Last 24 Hours  Not able to report  Psychosocial & Spiritual Assessment  Palliative Care Outcomes  Patient/Family meeting held?  Yes  Who was at the meeting?  2 dtr's, niece  Palliative Care Outcomes  Improved non-pain symptom therapy, Counseled regarding hospice, Changed to focus on comfort, Provided psychosocial or spiritual support, Provided end of life care assistance  Patient/Family wishes: Interventions discontinued/not started   Hemodialysis, BiPAP, Mechanical Ventilation, Vasopressors, NIPPV, Trach, Antibiotics, PEG, Tube feedings/TPN, Transfusion  Palliative Care follow-up planned  Yes, Facility      Patient Active Problem List   Diagnosis Date Noted  . Palliative care encounter   . Abdominal bloating   . Sinus tachycardia ([place])   . Anxiety state   . Vasculitis ([place])   . Abdominal distension   . Metastatic carcinoma ([place])   . SOB (shortness of breath)   . Pancreatitis   . LUQ pain   . Protein-calorie malnutrition ([place])   . Abdominal discomfort   . Pneumothorax   . Acute respiratory failure with hypoxia ([place])   . Pleural effusion associated with pancreatitis 10/09/2015  . Pleural effusion, right 10/03/2015  . Cavitating mass in right lower lung lobe 10/03/2015  . Wegener's granulomatosis (granulomatosis with polyangiitis) ([place]) 10/03/2015  . Acute on chronic respiratory failure with hypoxia ([place]) 09/28/2015  . Bilateral pleural effusion 09/28/2015  . UTI (lower urinary tract infection) 09/28/2015  . Other acute pancreatitis 09/23/2015  . Peripancreatic fluid collection ([place]) 09/23/2015  . Back  pain 08/08/2015  .  Wegener's granulomatosis with renal involvement ([place]) 07/30/2015  . Lung mass 06/25/2015  . Tobacco user 06/25/2015  . Other mixed anxiety disorders 06/16/2015  . Osteopenia 10/07/2014  . Esophageal dysphagia 09/24/2014  . Chest tube in place 08/24/2014  . Hx of adenomatous polyp of colon 07/25/2014  . 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 Benign head tremor 05/07/2014  . 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 COPD (chronic obstructive pulmonary disease) with emphysema ([place])   . Hypokalemia   . Hyperglycemia   . Depression 01/29/2012  . Dyslipidemia 08/04/2011  . CAD in native artery 08/04/2011  . History of tobacco abuse 08/04/2011  . Grief reaction   . Asthma 05/21/2010  . Allergic rhinitis 05/17/2010  . Hereditary hemochromatosis ([place]) 05/15/2010  . Essential hypertension 05/15/2010  . 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   07/24/2015 she underwent limited autoimmune workup that showed screening p-ANCA positive (no c-anca, pr3 or mpo avail). 08/06/15 she had full panel with PR-3 and MPO antibody results NEGATIVE and P-ANCA trace posiotive.  Patient then committed tohigh-dose prednisone and also Rituxan once a week for 4 weeks infusion. She completed 3 weeks of this infusion but she missed the 4th because of illness.  She believes ever since starting high-dose prednisone she started developing abdominal pain. A CT abdomen 09/23/2015 showed the right lower lobe mass at 2.6 cm perhaps smaller but  obscured by a right pleural effusion that was small, but she had acute pancreatitis features with small pseudocyst.   She continued having abdominal pain and was admitted7/04/2015. Since admission the right pleural effusion got worse and she had a thoracentesis 09/29/2015 that showed idiopathic exudate with acute inflammatory cells and mesothelial cells with an LDH of 400. Thoracentesis was repeated 10/02/2015 with an LDH of 336. This was complicated by small right basilar pneumothorax on chest x-ray 10/03/2015. She was discharged from [facility] on 10/07/15   Recommendations/Plan:  Pain: Continue with fentanyl continuous infusion we'll uptitrate dose to 250 g an hour with 175 g bolus every 15 minutes as needed  Dyspnea: Primary management is with opioids, we'll uptitrate fentanyl continuous infusion as noted above. Also continue Decadron 4 mg IV   Anxiety: We'll decrease interval on Ativan to 1 mg IV every 4 around-the-clock and continue with 1-2 mg as needed  Goals of Care and Additional Recommendations:  Limitations on Scope of Treatment: Full Comfort Care  Code Status:    Code Status Orders        Start     Ordered   10/29/15 1046  Do not attempt resuscitation (DNR)  Continuous    Question Answer Comment  In the event of cardiac or respiratory ARREST Do not call a "code blue"   In the event of cardiac or respiratory ARREST Do not perform Intubation, CPR, defibrillation or ACLS   In the event of cardiac or respiratory ARREST Use medication by any route, position, wound care, and other measures to relive pain and suffering. May use oxygen, suction and manual treatment of airway obstruction as needed for comfort.      10/29/15 1051    Code  Status History    Date Active Date Inactive Code Status Order ID Comments User Context   10/29/2015  7:51 AM 10/29/2015 10:51 AM DNR [id number]  Heath Lark, MD Inpatient   10/15/2015  1:17 PM 10/29/2015  7:51 AM Full Code [id number]  Magdalen Spatz,  NP Inpatient   09/28/2015 10:51 PM 10/07/2015  7:57 PM Full Code [id number]  Ivor Costa, MD ED   09/23/2015 11:59 AM 09/27/2015  5:04 PM Full Code [id number]  Edwin Dada, MD Inpatient   08/24/2014  8:39 PM 08/25/2014  7:08 PM Full Code [id number]  Lavina Hamman, MD ED   07/16/2011  3:04 AM 07/16/2011  4:08 PM Full Code [id number]  Orlie Dakin, MD ED   07/16/2011  2:45 AM 07/16/2011  3:04 AM Full Code [id number]  Orlie Dakin, MD ED       Prognosis:   Hours - Days  Discharge Planning:  Anticipated Hospital Death as patient is adamantly opposed to inpatient hospice  Care plan was discussed with Dr. Thereasa Solo  Thank you for allowing the Palliative Medicine Team to assist in the care of this patient.   Time In: 0830 Time Out: 0930 Total Time 60 min Prolonged Time Billed  no       Greater than 50%  of this time was spent counseling and coordinating care related to the above assessment and plan.  Dory Horn, NP  Please contact Palliative Medicine Team phone at [phone] for questions and concerns.

## 2015-11-03 ENCOUNTER — Encounter (HOSPITAL_COMMUNITY): Payer: Self-pay

## 2015-11-05 ENCOUNTER — Ambulatory Visit: Payer: Medicare Other | Admitting: Cardiology

## 2015-11-10 ENCOUNTER — Other Ambulatory Visit: Payer: Self-pay | Admitting: Internal Medicine

## 2015-11-11 ENCOUNTER — Telehealth: Payer: Self-pay

## 2015-11-11 NOTE — Telephone Encounter (Signed)
Home Health Cert/Plan of Care received (10/10/2015 - 12/08/2015) and placed on MD's desk for signature

## 2015-11-12 NOTE — Telephone Encounter (Signed)
Paperwork signed, faxed, copy sent to scan 

## 2015-11-26 ENCOUNTER — Ambulatory Visit: Payer: Medicare Other | Admitting: Internal Medicine

## 2015-11-28 NOTE — Progress Notes (Signed)
Patient's death pronounced at 1630/07/23 by this Probation officer and Austin Miles, RN. Patient passed away peacefully with her family surrounding her. Dr. Thereasa Solo and [facility] were notified. Patient's family remains at the bedside.

## 2015-11-28 NOTE — Progress Notes (Signed)
Patient's daughters have not wanted patient to be turned today. They stated they feel like she is more comfortable today and don't want to disturb her. Patient's oxygen saturation has decreased to the 60's and patient is becoming cyanotic. Heart rate is still sustaining in the 130's. Will continue to monitor and give support to family.

## 2015-11-28 NOTE — Progress Notes (Signed)
Wasted 250 mg bag of fentanyl that has been spiked down the medication room sink with Raylene Everts, RN as witness.

## 2015-11-28 NOTE — Discharge Summary (Signed)
Death Summary  [name] [name] [id number] DOB: 19-Oct-1945 DOA: 2015/10/16  PCP: [name], MD  Admit date: Oct 16, 2015 Date of Death: 11/06/15  Final Diagnoses:  Right Lower Lobe & Left Upper Lobe Pulmonary Masses -metastatic poorly differentiated carcinoma COPD Acute on Chronic Pancreatitis w/ Pseudocyst - Concern for cystic neoplasm  Possible lytic lesion in Rt acetabulum Coronary Artery Disease HTN  Anxiety/Depression Tobacco abuse disorder Hemochromatosis Chronic diarrhea Hypokalemia Hypomagnesemia  History of present illness:  70 y.o.F w/ a Hx of Depression, Anxiety, COPD, Tobacco abuse, RLL lung cavitary mass, Recurrent Enlarging Rt Hydropneumothorax S/P right thoracentesis 7/3 removing 1.4 L Exudative fluid, repeat thoracentesis 7/6 removed 1L of the same fluid, HLD, Chronic Diarrhea, CAD, Granulomatosis with Polyangiitis, 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   07/24/2015 she underwent limited autoimmune workup that showed screening p-ANCA positive (no c-anca, pr3 or mpo avail). 08/06/15 she had full panel with PR-3 and MPO antibody results NEGATIVE and P-ANCA trace posiotive.  Patient then committed tohigh-dose prednisone and also Rituxan once a week for 4 weeks infusion. She completed 3 weeks of this infusion but she missed the 4th because of illness.  She believes ever since starting high-dose prednisone she started developing abdominal pain. A CT abdomen 09/23/2015 showed the right lower lobe mass at 2.6 cm perhaps smaller but obscured by a right pleural effusion that was small, but she had acute pancreatitis features with small pseudocyst.   She continued having abdominal pain and was admitted7/04/2015. The right pleural effusion got worse and she had a thoracentesis 09/29/2015 that showed idiopathic exudate with acute inflammatory cells and mesothelial cells with an LDH of 400. Thoracentesis was repeated  10/02/2015 with an LDH of 336. This was complicated by small right basilar pneumothorax on chest x-ray 10/03/2015. She was discharged home from [facility] on 10/07/15.    The patient reported to the [facility] Pulmonary office on 2015/10/16 for scheduled follow-up appointment.  At that visit she reported worsening dyspnea over a 2 to three-day period.  CXR in the office noted significant reaccumulation of right pleural fluid.  Given the extent of her discomfort the decision was made to readmit her to the hospital to allow for a timely TCTS evaluation.  Hospital Course:  The patient was admitted to the acute units October 16, 2015 by the Pulmonary Critical Care Medicine team.  TCTS was consulted for definitive treatment of her symptomatic progressive right-sided pleural effusion.  She was confirmed to have a loculated right hydropneumothorax which required surgical exploration and chest tube placement on 10/23/2015.  A biopsy taken from the pleural space at that time, and ultimately proved positive for metastatic poorly differentiated carcinoma.  After a prolonged treatment course in the hospital, and after consultation with her Oncologist, it was felt that there was no active medical treatment available that would provide a reasonable likelihood of recovery.  The decision was made by the patient, her family, and the medical team to pursue comfort focused care.  The Palliative Care team attended to the patient's needs in this regard throughout the remainder of her hospital stay.  On 2015-11-06, with family at her side, the patient died in the hospital on unit 3 S.  Time: 16:32  Signed:  MCCLUNG,JEFFREY T  [facility] November 06, 2015, 5:23 PM

## 2015-11-28 NOTE — Care Management Important Message (Signed)
Important Message  Patient Details  Name: [name] [name] MRN: [id number] Date of Birth: 1945/10/21   Medicare Important Message Given:  Yes    [name] May 11/18/2015, 11:07 AM

## 2015-11-28 NOTE — Progress Notes (Signed)
The patient is seen and examined. Family members are present. The patient is not conscious. She appeared comfortable.  There are reported episodes of agitation and she appears to be responding to lorazepam She has persistent unstable vital signs and significant crackles on the right lung I offered emotional support to family members and defer management to palliative care service The family wants me to continue to check on her on a daily basis

## 2015-11-28 NOTE — Progress Notes (Addendum)
      [address]       [place] [address]             [phone]      17 Days Post-Op Procedure(s) (LRB): VIDEO ASSISTED THORACOSCOPY (VATS) with Drainage of Pleural Effusion (Right) DRAINAGE OF PLEURAL EFFUSION (Right)   Subjective:  Daughter at bedside, states she has not been waking up.    Objective: Vital signs in last 24 hours: Temp:  [98.2 F (36.8 C)-98.4 F (36.9 C)] 98.2 F (36.8 C) (08/07 0706) Pulse Rate:  [129-138] 137 (08/07 0706) Cardiac Rhythm: Sinus tachycardia (08/07 0730) Resp:  [8-24] 24 (08/07 0706) BP: (148-176)/(72-87) 148/72 (08/07 0706) SpO2:  [86 %-96 %] 86 % (08/07 0706)  Intake/Output from previous day: 08/06 0701 - 08/07 0700 In: 1534.8 [P.O.:90; I.V.:1444.8] Out: 165 [Urine:75; Chest Tube:90] Intake/Output this shift: No intake/output data recorded.  General appearance: not responsive, no apparent distress Heart: regular rate and rhythm and tachy Lungs: clear on left, coarse on right Chest tube: no air leak at rest, unable to assess with cough as patient does not respond  Lab Results: No results for input(s): WBC, HGB, HCT, PLT in the last 72 hours. BMET: No results for input(s): NA, K, CL, CO2, GLUCOSE, BUN, CREATININE, CALCIUM in the last 72 hours.  PT/INR: No results for input(s): LABPROT, INR in the last 72 hours. ABG    Component Value Date/Time   PHART 7.399 10/28/2015 0339   HCO3 30.1 (H) 10/28/2015 0339   TCO2 31.6 10/28/2015 0339   O2SAT 91.0 10/28/2015 0339   CBG (last 3)  No results for input(s): GLUCAP in the last 72 hours.  Assessment/Plan: S/P Procedure(s) (LRB): VIDEO ASSISTED THORACOSCOPY (VATS) with Drainage of Pleural Effusion (Right) DRAINAGE OF PLEURAL EFFUSION (Right)  1. Chest tube- 90 cc output yesterday, cant determine if air leak with cough as patient not responding 2. Leave chest tube in place, will not remove prior to patient's passing 3. Dispo-Will sign off, can call us if  further assistance, care per palliative   LOS: 21 days    BARRETT, ERIN 2015-11-22  Chart reviewed, patient examined, agree with above. I don't see an air leak but still tidaling. I agree with keeping tube in. Discussed with her daughters.

## 2015-11-28 NOTE — Progress Notes (Addendum)
   November 30, 2015 1556  Clinical Encounter Type  Visited With Patient and family together  Visit Type Patient actively dying  Referral From Nurse  Spiritual Encounters  Spiritual Needs Prayer;Grief support;Emotional  Stress Factors  Family Stress Factors Loss  Chaplain called to provide prayer and comfort at end of life. Prayed with daughters and stayed with them for about 45 minutes. Dez Stauffer, Chaplain Daughters expressed in strong terms wish that doctors had told them sooner how sick their mother was. They indicated that it would have helped not to suddenly be told she had less than 48 hours after being told that she had pancreatitis and would be going home.

## 2015-11-28 DEATH — deceased

## 2016-03-06 ENCOUNTER — Other Ambulatory Visit: Payer: Self-pay | Admitting: Nurse Practitioner

## 2016-06-14 ENCOUNTER — Ambulatory Visit: Payer: Medicare Other | Admitting: Hematology and Oncology

## 2016-06-14 ENCOUNTER — Ambulatory Visit: Payer: Medicare Other | Admitting: Hematology

## 2016-06-14 ENCOUNTER — Other Ambulatory Visit: Payer: Medicare Other

## 2017-01-15 IMAGING — CR DG CHEST 1V PORT
1 series · 1 of 1 positions shown · non-contrast
Comparison: 10/21/2015.

CLINICAL DATA: Chest tube.

EXAM:
PORTABLE CHEST 1 VIEW

[AP]
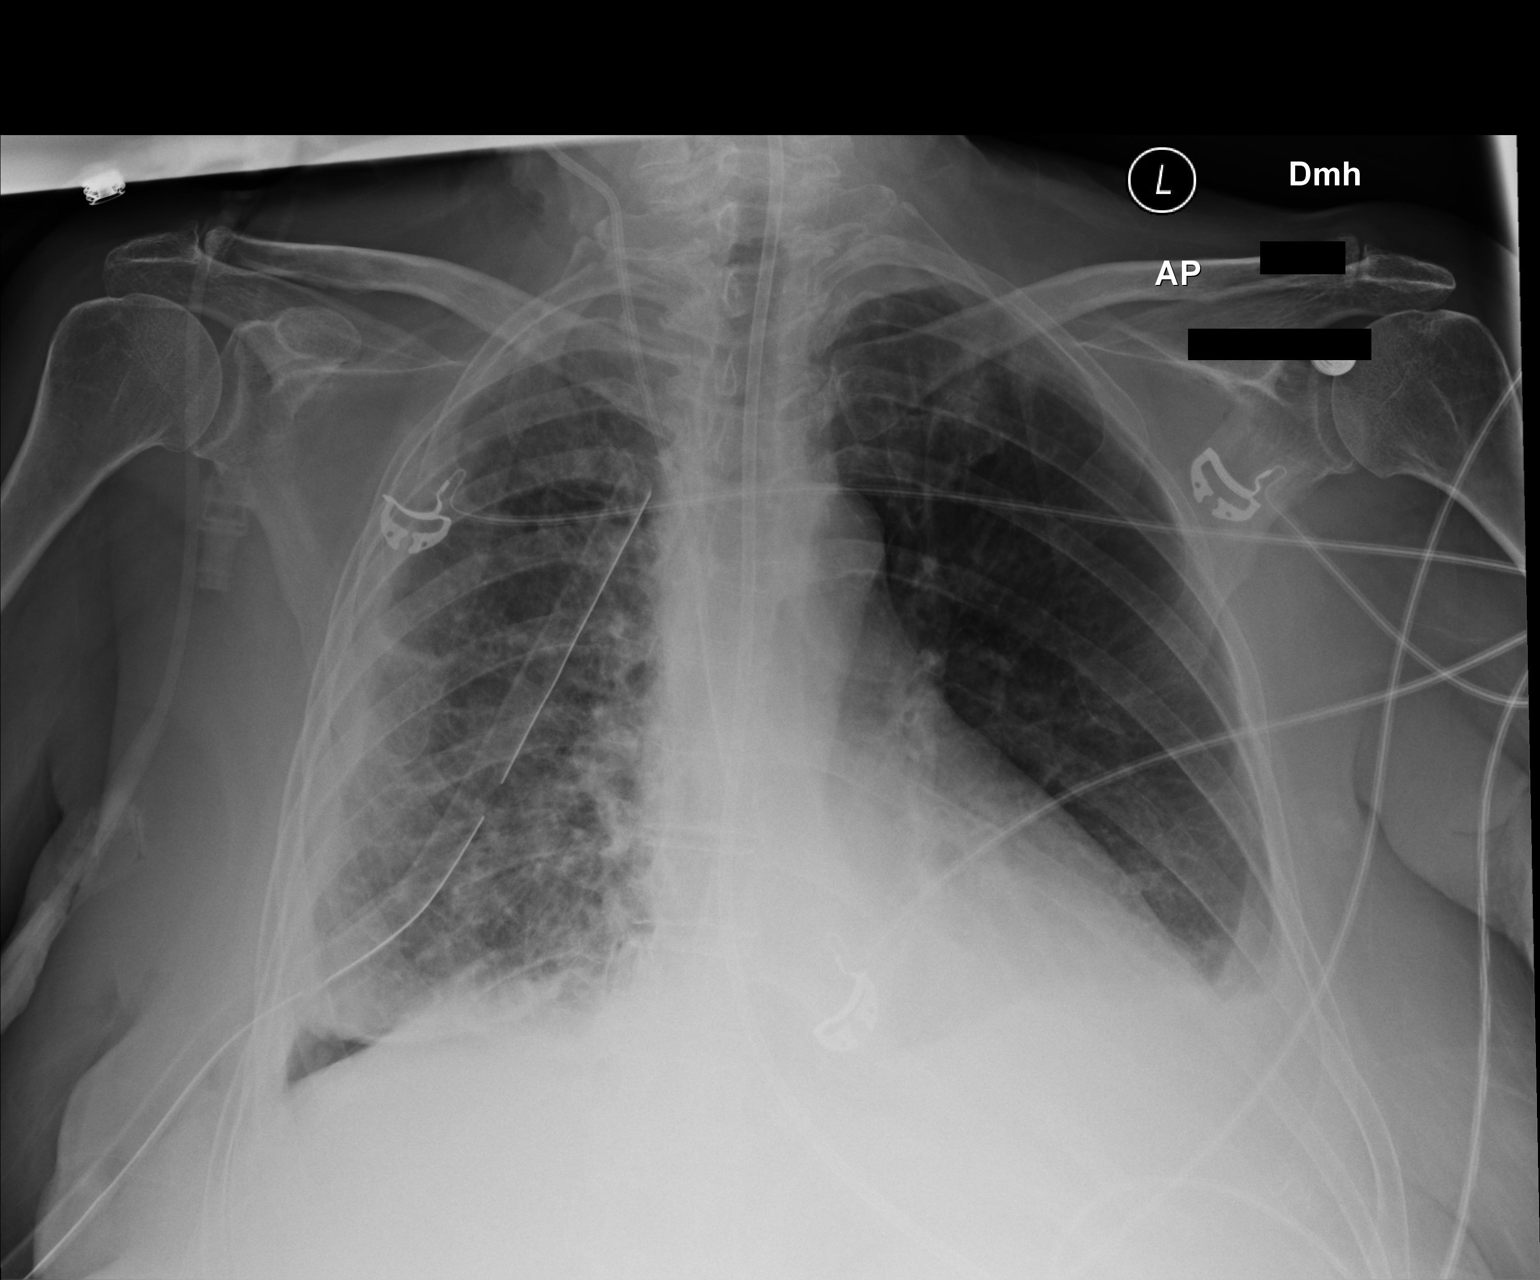

[1 of 1 positions shown; findings below may reference images not displayed]

FINDINGS: Interim placement of feeding tube. Its tip is below the
hemidiaphragm. Right IJ line right and right chest tube in stable
position. Small right basilar pneumothorax again noted. Low lung
volumes with bibasilar atelectasis and/or infiltrate. Small
bilateral pleural effusions again noted. Stable cardiomegaly.
IMPRESSION: 1. Interim placement of feeding tube, its tip is below left
hemidiaphragm. Right IJ line right chest tube in stable position.

2.  Stable small right basilar pneumothorax.

3. Persistent bibasilar atelectasis and/or infiltrates. Small
bilateral pleural effusions.

## 2017-01-19 IMAGING — MR MR MRCP
4 of 7 series · 21 of 48 positions shown · non-contrast
Comparison: CT scan 10/19/2015

CLINICAL DATA: Acute pancreatitis and history of pseudocysts.

EXAM:
MRI ABDOMEN WITHOUT CONTRAST  (INCLUDING MRCP)
TECHNIQUE: Multiplanar multisequence MR imaging of the abdomen was performed.
Heavily T2-weighted images of the biliary and pancreatic ducts were
obtained, and three-dimensional MRCP images were rendered by post
processing.

[Series 3: T2 · axial · 5.0mm · 0.78mm/px · z∈[-149,+116]mm · 6 of 54 slices shown (1 of 2)]
[im 1/54]
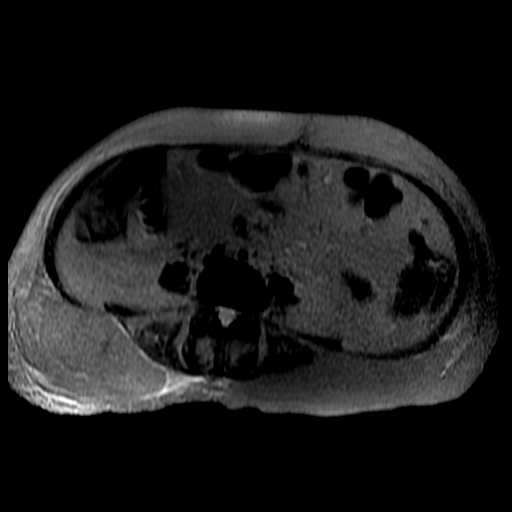
[im 11/54]
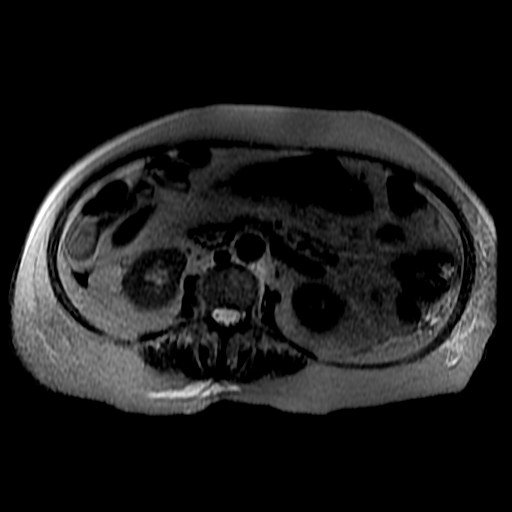
[im 22/54]
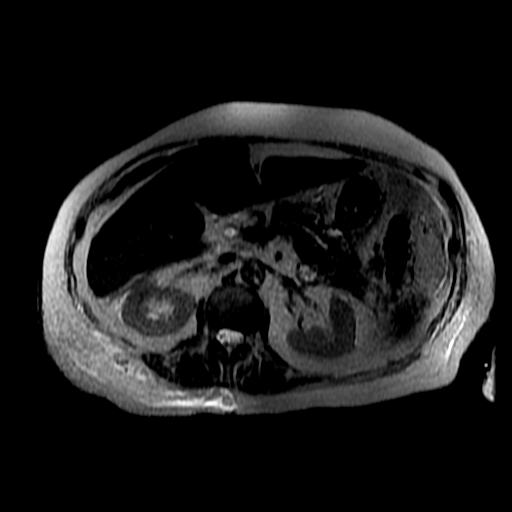
[im 32/54]
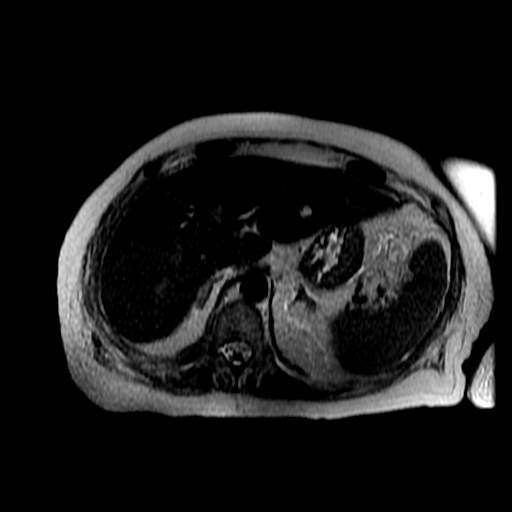
[im 43/54]
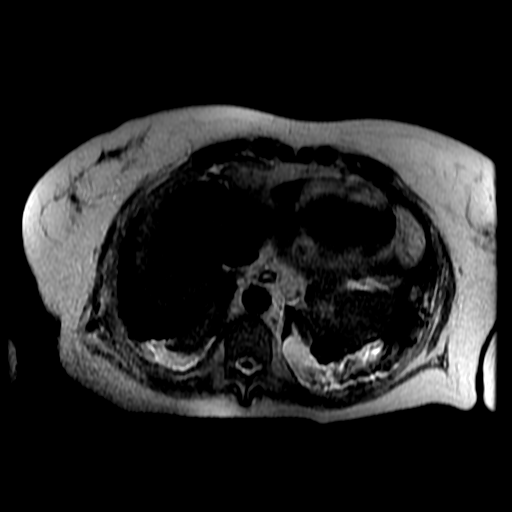
[im 54/54]
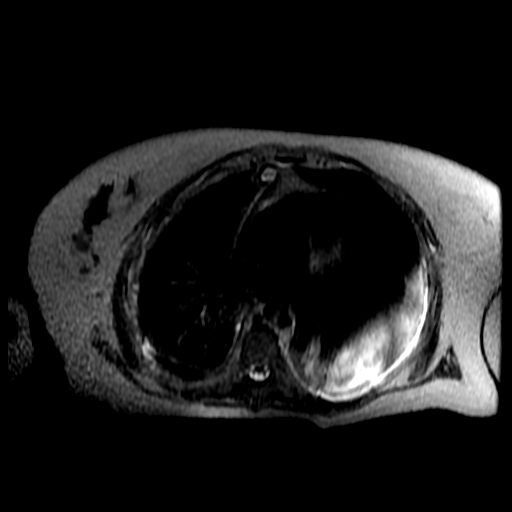

[Series 4: T2 · coronal · 5.0mm · 0.78mm/px · 5 of 51 slices shown (2 of 2)]
[im 1/51]
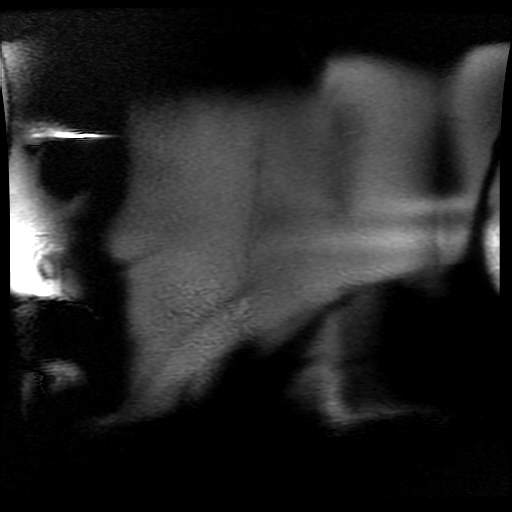
[im 13/51]
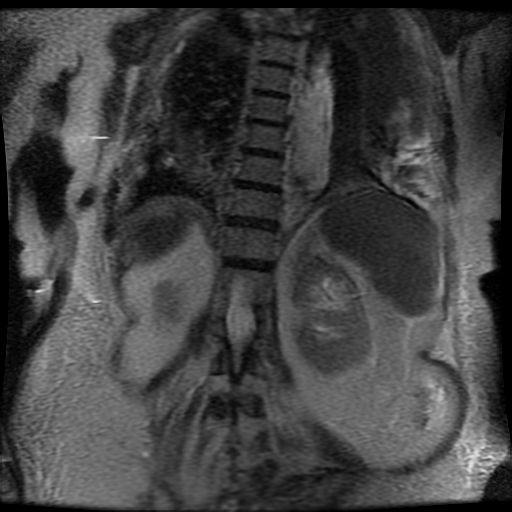
[im 26/51]
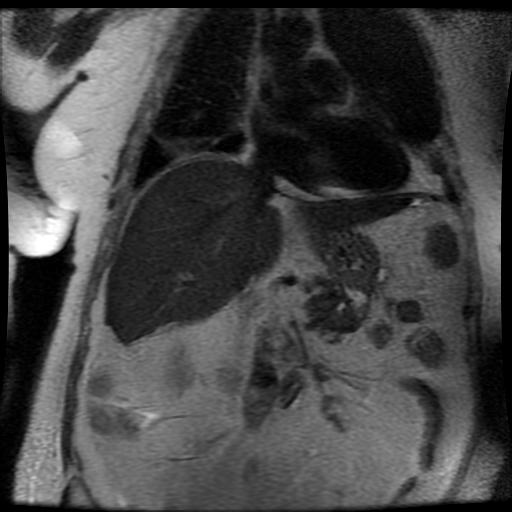
[im 38/51]
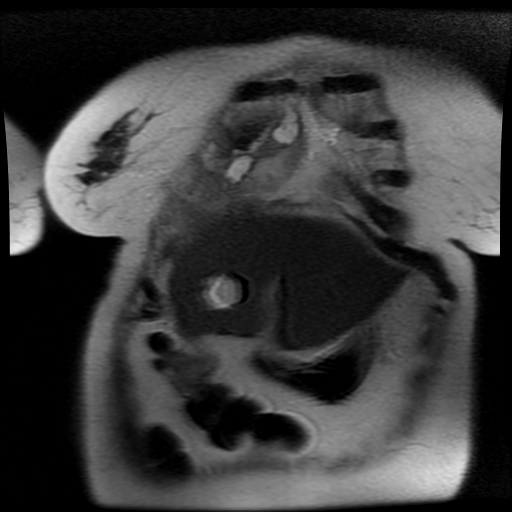
[im 51/51]
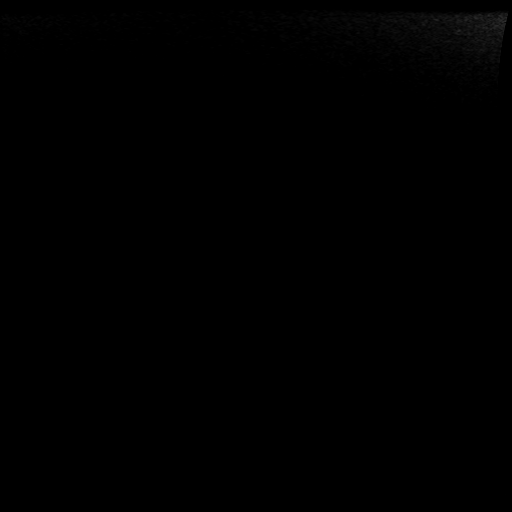

[Series 5: ax dualecho · axial · 5.0mm · 0.78mm/px · z∈[-149,+91]mm · 7 of 108 slices shown]
[im 1/108]
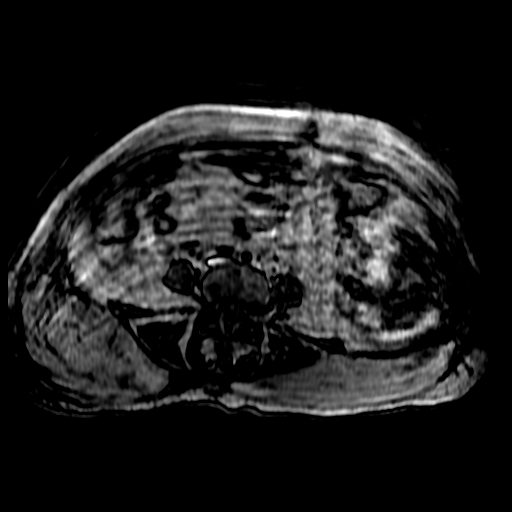
[im 11/108]
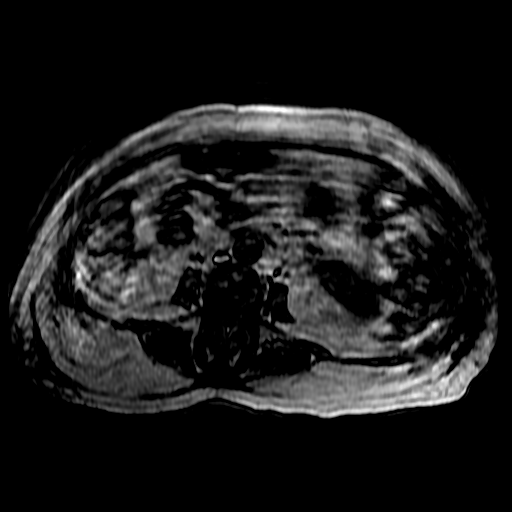
[im 22/108]
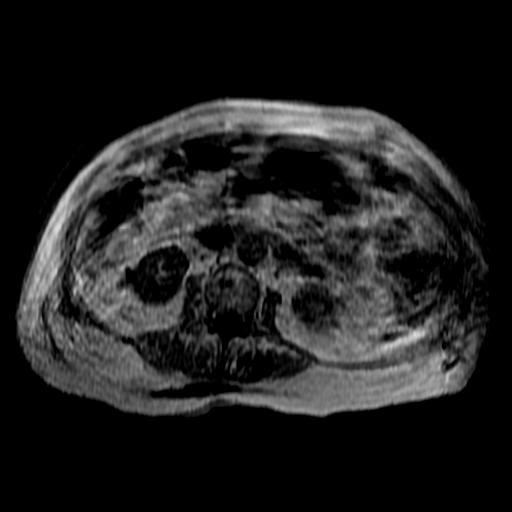
[im 33/108]
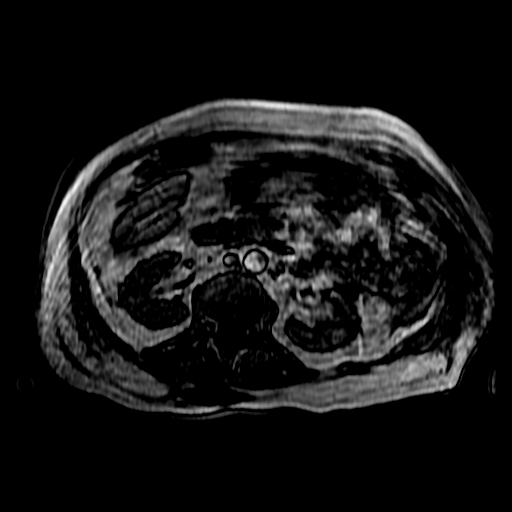
[im 43/108]
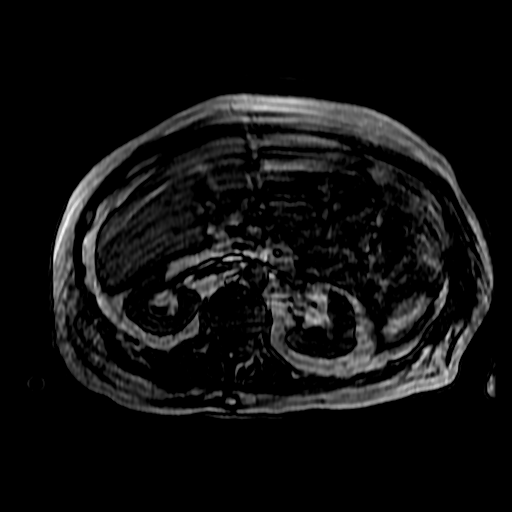
[im 54/108]
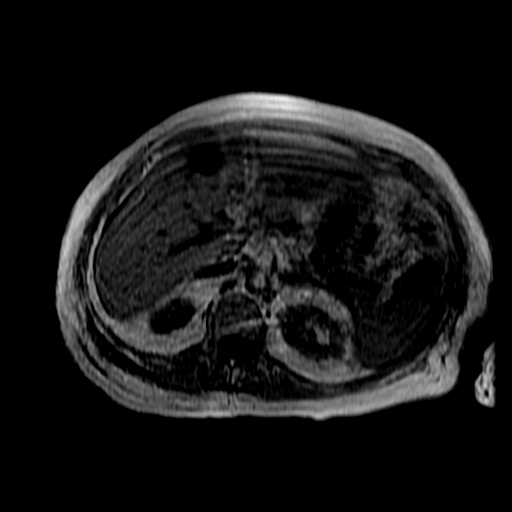
[im 97/108]
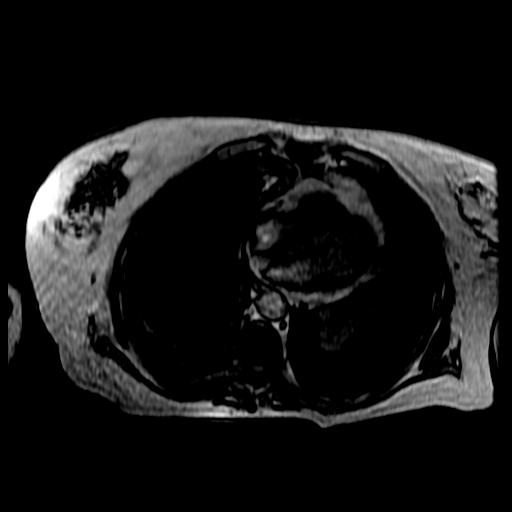

[Series 7: DWI b500 · axial · 6.0mm · 1.48mm/px · z∈[-110,+77]mm · 3 of 65 slices shown]
[im 11/65]
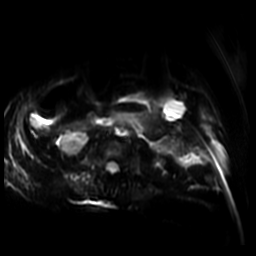
[im 33/65]
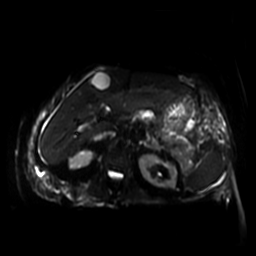
[im 54/65]
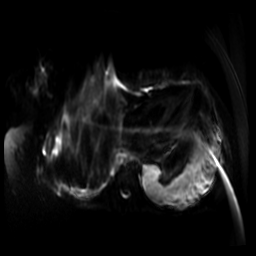

[21 of 48 positions shown; findings below may reference images not displayed]

FINDINGS: Examination is quite limited due to respiratory motion.

The lungs demonstrate severe lung disease along with the pleural
effusions. The heart is mildly enlarged.

Benign-appearing hepatic cysts are again demonstrated. No worrisome
hepatic lesions or intrahepatic biliary dilatation. The gallbladder
surgically absent. Normal caliber and course of the common bile
duct. No obvious common bile ducts stones.

The pancreatic duct is also normal in caliber and has a normal
course. No pancreatic divisum. In the midbody region of the pancreas
I do not see the pancreatic duct. I assume it is mildly compressed
by acute inflammation/ pancreatitis. I do not see an obvious mass
and I do not see any mass lesion the prior CT scans.

There is fluid around the pancreas consistent with pancreatitis.
Small pseudocysts are again noted.

No mesenteric or retroperitoneal mass or adenopathy.
IMPRESSION: 1. Very limited study due to respiratory motion.
2. Normal caliber and course of the common bile duct. No common bile
duct stones.
3. Findings consistent with pancreatitis. No mass and no evidence of
pancreatic divisum.
4. Stable hepatic cysts.
5. Extensive bilateral lung disease and effusions.
